# Patient Record
Sex: Female | Born: 1975 | Race: White | Hispanic: No | Marital: Married | Smoking: Never smoker
Health system: Southern US, Community
[De-identification: ages and names within clinical notes are randomized; demographics above are authoritative.]

## PROBLEM LIST (undated history)

## (undated) DIAGNOSIS — J209 Acute bronchitis, unspecified: Secondary | ICD-10-CM

## (undated) DIAGNOSIS — Z808 Family history of malignant neoplasm of other organs or systems: Secondary | ICD-10-CM

## (undated) DIAGNOSIS — Z803 Family history of malignant neoplasm of breast: Secondary | ICD-10-CM

## (undated) DIAGNOSIS — D649 Anemia, unspecified: Secondary | ICD-10-CM

## (undated) DIAGNOSIS — K219 Gastro-esophageal reflux disease without esophagitis: Secondary | ICD-10-CM

## (undated) DIAGNOSIS — G43909 Migraine, unspecified, not intractable, without status migrainosus: Secondary | ICD-10-CM

## (undated) DIAGNOSIS — F419 Anxiety disorder, unspecified: Secondary | ICD-10-CM

## (undated) DIAGNOSIS — Z8042 Family history of malignant neoplasm of prostate: Secondary | ICD-10-CM

## (undated) DIAGNOSIS — F32A Depression, unspecified: Secondary | ICD-10-CM

## (undated) DIAGNOSIS — C801 Malignant (primary) neoplasm, unspecified: Secondary | ICD-10-CM

## (undated) HISTORY — DX: Family history of malignant neoplasm of other organs or systems: Z80.8

## (undated) HISTORY — DX: Family history of malignant neoplasm of prostate: Z80.42

## (undated) HISTORY — DX: Family history of malignant neoplasm of breast: Z80.3

## (undated) HISTORY — PX: DILATION AND CURETTAGE OF UTERUS: SHX78

---

## 2006-04-16 HISTORY — PX: LAPAROTOMY: SHX154

## 2009-07-14 ENCOUNTER — Ambulatory Visit (HOSPITAL_COMMUNITY): Admission: RE | Admit: 2009-07-14 | Discharge: 2009-07-14 | Payer: Self-pay | Admitting: Gastroenterology

## 2011-04-17 NOTE — L&D Delivery Note (Signed)
Delivery Note At 12:30 PM a viable female was delivered via Vaginal, Spontaneous Delivery (Presentation: Left Occiput Anterior).  Nuchal cord x 2 around the neck. Unusually long cord. Reduced prior to delivery of shoulders and body.  APGAR: 8, 9; weight .   Placenta status: Intact, Manual removal due to cord evulsion. Pathology.  Cord: 3 vessels with the following complications: None.  Cord pH: 7.26  NICU Team present for delivery due to meconium and reduced variability on EFM  Anesthesia: None  Episiotomy: None Lacerations: 2nd degree;Labial Rt  with repair  "V" shaped tear Labia Majora and 1st degree Lt Labial Minora - with no repair Suture Repair: 3.0 vicryl Local anesthesia, 1% lidocaine for repair. Repair of Rt Labia Majora had good alignment. Est. Blood Loss (mL): 400 Baby was placed on maternal chest after evaluation of NICU Team. Baby placed on Father's chest during perineal repair.  Mom to postpartum.  Baby to nursery-stable.  Ocean Schildt, CNM. 03/17/2012, 1:33 PM

## 2011-06-10 ENCOUNTER — Encounter (HOSPITAL_COMMUNITY): Payer: Self-pay | Admitting: *Deleted

## 2011-06-10 ENCOUNTER — Emergency Department (HOSPITAL_COMMUNITY)
Admission: EM | Admit: 2011-06-10 | Discharge: 2011-06-10 | Disposition: A | Discharge status: Home / Self Care | Payer: BC Managed Care – PPO | Source: Home / Self Care | Attending: Emergency Medicine | Admitting: Emergency Medicine

## 2011-06-10 ENCOUNTER — Emergency Department (INDEPENDENT_AMBULATORY_CARE_PROVIDER_SITE_OTHER): Payer: BC Managed Care – PPO

## 2011-06-10 DIAGNOSIS — S51809A Unspecified open wound of unspecified forearm, initial encounter: Secondary | ICD-10-CM

## 2011-06-10 DIAGNOSIS — S51812A Laceration without foreign body of left forearm, initial encounter: Secondary | ICD-10-CM

## 2011-06-10 HISTORY — DX: Gastro-esophageal reflux disease without esophagitis: K21.9

## 2011-06-10 MED ORDER — BACITRACIN 500 UNIT/GM EX OINT
1.0000 "application " | TOPICAL_OINTMENT | Freq: Once | CUTANEOUS | Status: AC
  Administered 2011-06-10: 1 via TOPICAL

## 2011-06-10 MED ORDER — IBUPROFEN 600 MG PO TABS: 600.0000 mg | ORAL_TABLET | Freq: Four times a day (QID) | ORAL | Status: AC | PRN

## 2011-06-10 MED ORDER — LIDOCAINE-EPINEPHRINE 2 %-1:100000 IJ SOLN
5.0000 mL | Freq: Once | INTRAMUSCULAR | Status: AC
  Administered 2011-06-10: 5 mL

## 2011-06-10 NOTE — ED Provider Notes (Signed)
History     CSN: [id number]  Arrival date & time 06/10/11  1710   First MD Initiated Contact with Patient 06/10/11 1813      Chief Complaint  Patient presents with  . Laceration    (Consider location/radiation/quality/duration/timing/severity/associated sxs/prior treatment) HPI Comments: Patient states that she was gardening, and cut her left forearm on  Large gardening shears. Patient states she irrigated the wound copiously with tap water, and stop the bleeding by applying direct pressure. No foreign body sensation. No no tingling, numbness, pain distal to injury. No limitation in motion. No redness streaking from the wound.   Patient is a 36 y.o. female presenting with skin laceration. The history is provided by the patient. No language interpreter was used.  Laceration  The incident occurred 3 to 5 hours ago. The laceration is located on the left arm. The laceration is 2 cm in size. The pain has been constant since onset. She reports no foreign bodies present. Her tetanus status is UTD.    Past Medical History  Diagnosis Date  . Acid reflux   . Asthma     Past Surgical History  Procedure Date  . Dilation and curettage of uterus     History reviewed. No pertinent family history.  History  Substance Use Topics  . Smoking status: Never Smoker   . Smokeless tobacco: Not on file  . Alcohol Use: Yes    OB History    Grav Para Term Preterm Abortions TAB SAB Ect Mult Living                  Review of Systems  Constitutional: Negative for fever.  Musculoskeletal: Negative for joint swelling.  Skin: Positive for wound.  Neurological: Negative for weakness and numbness.    Allergies  Review of patient's allergies indicates no known allergies.  Home Medications   Current Outpatient Rx  Name Route Sig Dispense Refill  . ALBUTEROL SULFATE HFA 108 (90 BASE) MCG/ACT IN AERS Inhalation Inhale 2 puffs into the lungs every 6 (six) hours as needed.    Marland Kitchen ESOMEPRAZOLE  MAGNESIUM 40 MG PO CPDR Oral Take 40 mg by mouth daily before breakfast.    . FEMARA PO Oral Take by mouth. Cycle days 3-7    . IBUPROFEN 600 MG PO TABS Oral Take 1 tablet (600 mg total) by mouth every 6 (six) hours as needed for pain. 30 tablet 0    BP 114/77  Pulse 53  Temp(Src) 98.7 F (37.1 C) (Oral)  Resp 15  SpO2 100%  LMP 06/05/2011  Physical Exam  Nursing note and vitals reviewed. Constitutional: She is oriented to person, place, and time. She appears well-developed and well-nourished. No distress.  HENT:  Head: Normocephalic and atraumatic.  Eyes: Conjunctivae and EOM are normal.  Neck: Normal range of motion.  Cardiovascular: Normal rate.   Pulmonary/Chest: Effort normal.  Abdominal: She exhibits no distension.  Musculoskeletal: Normal range of motion.       1.5 cm laceration distal left forearm on ulnar side. No redness streaking from the wound. No apparent foreign body retained. RP 2+. Hand sensation and strength intact in the median, radian, ulnar nerve distribution.  Neurological: She is alert and oriented to person, place, and time.  Skin: Skin is warm and dry.  Psychiatric: She has a normal mood and affect. Her behavior is normal. Judgment and thought content normal.    ED Course  LACERATION REPAIR Date/Time: 06/10/2011 9:04 PM Performed by: Luiz Blare Authorized  by: Domenick Gong M Consent: Written consent not obtained. Risks and benefits: risks, benefits and alternatives were discussed Consent given by: patient Patient understanding: patient states understanding of the procedure being performed Patient consent: the patient's understanding of the procedure matches consent given Imaging studies: imaging studies available Required items: required blood products, implants, devices, and special equipment available Patient identity confirmed: verbally with patient Time out: Immediately prior to procedure a "time out" was called to verify the correct  patient, procedure, equipment, support staff and site/side marked as required. Body area: upper extremity Location details: left lower arm Laceration length: 1.5 cm Foreign bodies: no foreign bodies Tendon involvement: none Nerve involvement: none Vascular damage: no Anesthesia: local infiltration Local anesthetic: lidocaine 2% with epinephrine Anesthetic total: 5 ml Patient sedated: no Preparation: Patient was prepped and draped in the usual sterile fashion. Irrigation solution: tap water Irrigation method: tap (Chlorhexidine scrub) Amount of cleaning: extensive Debridement: none Degree of undermining: none Skin closure: 4-0 nylon Number of sutures: 3 Technique: simple Approximation: close Approximation difficulty: simple Dressing: 4x4 sterile gauze, antibiotic ointment and pressure dressing Patient tolerance: Patient tolerated the procedure well with no immediate complications. Comments: Wound explored with adequate hemostasis. No foreign body, tendon involvement noted.   (including critical care time)  Labs Reviewed - No data to display No results found.   1. Laceration of left forearm   No information on file.  Dg Forearm Left  06/10/2011  *RADIOLOGY REPORT*  Clinical Data: Laceration to the ulnar aspect of the forearm, with garden shears.  Assess for foreign body.  LEFT FOREARM - 2 VIEW  Comparison: None.  Findings: No radiopaque foreign body is identified.  There is minimal cortical disruption along the ulnar aspect of the distal ulnar diaphysis.  Overlying soft tissue disruption is noted, with mild soft tissue air.  Visualized joint spaces are preserved.  Negative ulnar variance is noted.  The carpal rows appear grossly intact, and demonstrate normal alignment.  The elbow joint is grossly unremarkable in appearance; no elbow joint effusion is identified.  No additional soft tissue abnormalities are identified.  IMPRESSION:  1.  No radiopaque foreign body identified. 2.   Minimal cortical disruption along the ulnar aspect of the distal ulnar diaphysis; overlying soft tissue disruption, with mild soft tissue air.  Original Report Authenticated By: Tonia Ghent, M.D.    MDM  Imaging reviewed by myself. No foreign body visualized Report per radiologist. Discussed findings with patient.   Luiz Blare, MD 06/10/11 2134

## 2011-06-10 NOTE — ED Notes (Addendum)
Pt with laceration left anterior forearm cut pruning shears approx 3 hours ago bleeding controlled tetnus 09

## 2011-06-10 NOTE — Discharge Instructions (Signed)
Ice 20 min at a time. Take the medication as written. Take 1 gram of tylenol with the motrin up to 4 times a day as needed for pain and fever. This is an effective combination for pain. Return if you get worse, have a fever >100.4, or for any concerns.

## 2011-06-20 ENCOUNTER — Emergency Department (INDEPENDENT_AMBULATORY_CARE_PROVIDER_SITE_OTHER)
Admission: EM | Admit: 2011-06-20 | Discharge: 2011-06-20 | Disposition: A | Discharge status: Home Health | Payer: BC Managed Care – PPO | Source: Home / Self Care

## 2011-06-20 ENCOUNTER — Encounter (HOSPITAL_COMMUNITY): Payer: Self-pay | Admitting: Emergency Medicine

## 2011-06-20 DIAGNOSIS — Z4802 Encounter for removal of sutures: Secondary | ICD-10-CM

## 2011-06-20 NOTE — ED Provider Notes (Signed)
History     CSN: [id number]  Arrival date & time 06/20/11  0806   None     Chief Complaint  Patient presents with  . Suture / Staple Removal    (Consider location/radiation/quality/duration/timing/severity/associated sxs/prior treatment) Patient is a 36 y.o. female presenting with suture removal.  Suture / Staple Removal  Treated in ED: 10 days ago. There has been no treatment since the wound repair. There has been no drainage from the wound. There is no redness present. There is no swelling present. The pain has no pain. She has no difficulty moving the affected extremity or digit.    Past Medical History  Diagnosis Date  . Acid reflux   . Asthma     Past Surgical History  Procedure Date  . Dilation and curettage of uterus     History reviewed. No pertinent family history.  History  Substance Use Topics  . Smoking status: Never Smoker   . Smokeless tobacco: Not on file  . Alcohol Use: Yes    OB History    Grav Para Term Preterm Abortions TAB SAB Ect Mult Living                  Review of Systems  Constitutional: Negative for fever.  Musculoskeletal: Negative for joint swelling.  Neurological: Negative for weakness and numbness.    Allergies  Review of patient's allergies indicates no known allergies.  Home Medications   Current Outpatient Rx  Name Route Sig Dispense Refill  . ALBUTEROL SULFATE HFA 108 (90 BASE) MCG/ACT IN AERS Inhalation Inhale 2 puffs into the lungs every 6 (six) hours as needed.    Marland Kitchen ESOMEPRAZOLE MAGNESIUM 40 MG PO CPDR Oral Take 40 mg by mouth daily before breakfast.    . IBUPROFEN 600 MG PO TABS Oral Take 1 tablet (600 mg total) by mouth every 6 (six) hours as needed for pain. 30 tablet 0  . FEMARA PO Oral Take by mouth. Cycle days 3-7      BP 103/66  Pulse 48  Temp(Src) 98.5 F (36.9 C) (Oral)  Resp 16  SpO2 100%  LMP 06/05/2011  Physical Exam  Nursing note and vitals reviewed. Constitutional: She appears well-developed  and well-nourished. No distress.  HENT:  Head: Normocephalic and atraumatic.  Musculoskeletal: Normal range of motion. She exhibits no edema and no tenderness.  Neurological: She is alert.  Skin: Skin is warm and dry.       Well healing laceration Lt forearm with 3 sutures in place.  Psychiatric: She has a normal mood and affect.    ED Course  SUTURE REMOVAL Date/Time: 06/20/2011 8:34 AM Performed by: Melody Comas Authorized by: Melody Comas Consent: Verbal consent obtained. Written consent not obtained. Consent given by: patient Patient understanding: patient states understanding of the procedure being performed Patient consent: the patient's understanding of the procedure matches consent given Body area: upper extremity Location details: left lower arm Wound Appearance: clean Sutures Removed: 3 Post-removal: Steri-Strips applied Facility: sutures placed in this facility Comments: Immediately after all 3 sutures removed wound edges noted to be beginning to dehisce. Therefore a Steri-Strip was applied. Patient was instructed to leave in place for 2-3 days and keep the area dry.   (including critical care time)  Labs Reviewed - No data to display No results found.   1. Visit for suture removal       MDM  Pt sent home with remaining steri strips for home application as needed -  if steri strip applied today comes off early, or if in 2-3 days when current steri strip is removed wound does not appear well healed to reapply steri strip.         Melody Comas, [place] 06/20/11 [phone]

## 2011-06-20 NOTE — ED Notes (Signed)
Pt here for stitch removal to left anterior f/a s/p laceration x10 dys ago.denies pain but itching.

## 2011-06-20 NOTE — Discharge Instructions (Signed)
Keep steri strip dry for 2-3 days. Then may remove the steri -strip. If the wound edges still appear to be seperating, continue to apply steri strip daily until well healed as discussed. Return as needed.

## 2011-06-25 NOTE — ED Provider Notes (Signed)
Medical screening examination/treatment/procedure(s) were performed by non-physician practitioner and as supervising physician I was immediately available for consultation/collaboration.  LANEY,RONNIE   Rorie Delmore B Laney, MD 06/25/11 2149 

## 2011-08-01 LAB — OB RESULTS CONSOLE ANTIBODY SCREEN: Antibody Screen: NEGATIVE

## 2011-08-01 LAB — OB RESULTS CONSOLE ABO/RH: RH Type: POSITIVE

## 2011-08-01 LAB — OB RESULTS CONSOLE HIV ANTIBODY (ROUTINE TESTING): HIV: NONREACTIVE

## 2011-08-01 LAB — OB RESULTS CONSOLE RUBELLA ANTIBODY, IGM: Rubella: IMMUNE

## 2011-08-01 LAB — OB RESULTS CONSOLE HEPATITIS B SURFACE ANTIGEN: Hepatitis B Surface Ag: NEGATIVE

## 2011-08-15 LAB — OB RESULTS CONSOLE GC/CHLAMYDIA
Chlamydia: NEGATIVE
Gonorrhea: NEGATIVE

## 2011-09-07 ENCOUNTER — Ambulatory Visit (HOSPITAL_COMMUNITY)
Admission: RE | Admit: 2011-09-07 | Discharge: 2011-09-07 | Disposition: A | Discharge status: Home / Self Care | Payer: BC Managed Care – PPO | Source: Ambulatory Visit | Attending: Obstetrics | Admitting: Obstetrics

## 2011-09-07 ENCOUNTER — Encounter (HOSPITAL_COMMUNITY): Payer: Self-pay

## 2011-09-07 DIAGNOSIS — O09299 Supervision of pregnancy with other poor reproductive or obstetric history, unspecified trimester: Secondary | ICD-10-CM

## 2011-09-07 NOTE — Progress Notes (Signed)
MATERNAL FETAL MEDICINE CONSULT  Patient Name: Niketa Turner Medical Record Number:  [id number] Date of Birth: 06/22/1975 Requesting Physician Name:  Tresa Endo A. Ernestina Penna, MD Date of Service: 09/07/2011  Chief Complaint Prior 2nd trimester pregnancy loss  History of Present Illness Shaylee Stanislawski was seen today for a history of a prior 2nd trimester pregnancy loss at the request of Tresa Endo A. Ernestina Penna, MD.  The patient is a 36 y.o. G3P0020 at [redacted]w[redacted]d with an EDC of 03/11/2012, by Last Menstrual Period.  In her first pregnancy in 2006 she experienced a loss of monochorionic diamniotic twins at 49 weeks.  From the patient's description of that pregnancy it is likely she developed cervical insufficiency, but preterm labor and/or infection as a potential etiology cannot be excluded.  She required a D&C after that delivery.  Her second pregnancy resulted in a missed abortion at 13 weeks, when the fetus was measuring 7 weeks.  Those pregnancies and her current one were conceived using ovulation induction.  She has had no complications of her current pregnancy thus far.   Review of Systems Pertinent items are noted in HPI.  Patient History OB History    Grav Para Term Preterm Abortions TAB SAB Ect Mult Living   3    2  2         # Outc Date GA Lbr Len/2nd Wgt Sex Del Anes PTL Lv   1 SAB 2006 [redacted]w[redacted]d          Comments: Twins 19 week loss, possible cervical insufficiency   2 SAB 2009 [redacted]w[redacted]d          Comments: Missed abortion, fetus measured 7 weeks   3 CUR               Past Medical History  Diagnosis Date  . Acid reflux   . Asthma     Past Surgical History  Procedure Date  . Dilation and curettage of uterus     2006 x1, 2009 x2    History   Social History  . Marital Status: Married    Spouse Name: N/A    Number of Children: N/A  . Years of Education: N/A   Social History Main Topics  . Smoking status: Never Smoker   . Smokeless tobacco: None  . Alcohol Use: No  . Drug Use: No  .  Sexually Active: Not Currently    Birth Control/ Protection: None   Other Topics Concern  . None   Social History Narrative  . None   Family History The patient has no family history of mental retardation, birth defects, or genetic diseases.  Assessment and Recommendations 1.  Prior pregnancy losses.  The patient's 13 weeks loss should have little effect on the prospects of her current pregnancy.  However, her 19 week loss raises concern for cervical insufficiency.  The true impact of this on her recurrence risk in this pregnancy is difficult to determine as it was a twin pregnancy, which is an independent risk factor for cervical insufficiency.  Thus, the currently available literature provides little guidance for a woman in Ms. Juday's situation.  However, given the minimal side effects of 17-OH progesterone it is reasonable to proceed with weekly injections beginning at 16 weeks of pregnancy.  Cervical length surveillance, also starting at 16 weeks can also be considered.  Cerclage would be an option of cervical length fell below 1.5 cm.  However, given the uncertainty of benefit in her particular situation forgoing any or all of those  treatments would be reasonable.  After discussing this at length with Ms. Hunsberger and her husband, she has elected to proceed with both 17-OH progesterone and cervical length surveillance.  She has some misgivings about cerclage placement given the risks involved and wishes to readdress this issue should she develop cervical shortening over the course of pregnancy.  Of note, Ms. Fobes reports she will be staying in [place], [place] for an extended period of time starting at approximately 18 weeks.  Arrangements for her 17-OH progesterone and cervical length surveillance will need to be made.  I spent 30 minutes with Ms. Jacobson today of which 50% was face-to-face counseling.  Rema Fendt, MD

## 2011-09-17 ENCOUNTER — Encounter: Payer: Self-pay | Admitting: Obstetrics

## 2011-09-17 ENCOUNTER — Other Ambulatory Visit: Payer: Self-pay | Admitting: Obstetrics

## 2011-09-17 LAB — US OB TRANSVAGINAL

## 2011-09-17 LAB — US OB COMP LESS 14 WKS

## 2011-12-21 LAB — OB RESULTS CONSOLE RPR: RPR: NONREACTIVE

## 2012-02-04 LAB — OB RESULTS CONSOLE GBS: GBS: NEGATIVE

## 2012-03-04 ENCOUNTER — Institutional Professional Consult (permissible substitution): Payer: Self-pay | Admitting: Pediatrics

## 2012-03-06 ENCOUNTER — Ambulatory Visit (INDEPENDENT_AMBULATORY_CARE_PROVIDER_SITE_OTHER): Payer: Self-pay | Admitting: Pediatrics

## 2012-03-06 DIAGNOSIS — Z7681 Expectant parent(s) prebirth pediatrician visit: Secondary | ICD-10-CM

## 2012-03-17 ENCOUNTER — Inpatient Hospital Stay (HOSPITAL_COMMUNITY)
Admission: AD | Admit: 2012-03-17 | Discharge: 2012-03-19 | DRG: 372 | Disposition: A | Discharge status: Home / Self Care | Payer: BC Managed Care – PPO | Source: Ambulatory Visit | Attending: Obstetrics & Gynecology | Admitting: Obstetrics & Gynecology

## 2012-03-17 ENCOUNTER — Encounter (HOSPITAL_COMMUNITY): Payer: Self-pay | Admitting: *Deleted

## 2012-03-17 DIAGNOSIS — O09529 Supervision of elderly multigravida, unspecified trimester: Secondary | ICD-10-CM | POA: Diagnosis present

## 2012-03-17 DIAGNOSIS — O9902 Anemia complicating childbirth: Secondary | ICD-10-CM | POA: Diagnosis not present

## 2012-03-17 DIAGNOSIS — O9903 Anemia complicating the puerperium: Secondary | ICD-10-CM | POA: Diagnosis not present

## 2012-03-17 DIAGNOSIS — D62 Acute posthemorrhagic anemia: Secondary | ICD-10-CM | POA: Diagnosis not present

## 2012-03-17 LAB — CBC
HCT: 40.7 % (ref 36.0–46.0)
Hemoglobin: 14.1 g/dL (ref 12.0–15.0)
MCH: 32.6 pg (ref 26.0–34.0)
MCHC: 34.6 g/dL (ref 30.0–36.0)
MCV: 94.2 fL (ref 78.0–100.0)
Platelets: 143 10*3/uL — ABNORMAL LOW (ref 150–400)
RBC: 4.32 MIL/uL (ref 3.87–5.11)
RDW: 14.4 % (ref 11.5–15.5)
WBC: 8.7 10*3/uL (ref 4.0–10.5)

## 2012-03-17 LAB — RPR: RPR Ser Ql: NONREACTIVE

## 2012-03-17 LAB — ABO/RH: ABO/RH(D): O POS

## 2012-03-17 MED ORDER — DIBUCAINE 1 % RE OINT
1.0000 "application " | TOPICAL_OINTMENT | RECTAL | Status: DC | PRN
  Administered 2012-03-17: 1 via RECTAL
  Filled 2012-03-17: qty 28

## 2012-03-17 MED ORDER — PRENATAL MULTIVITAMIN CH
1.0000 | ORAL_TABLET | Freq: Every day | ORAL | Status: DC
  Administered 2012-03-18 – 2012-03-19 (×2): 1 via ORAL
  Filled 2012-03-17 (×2): qty 1

## 2012-03-17 MED ORDER — LIDOCAINE HCL (PF) 1 % IJ SOLN: 30.0000 mL | INTRAMUSCULAR | Status: DC | PRN

## 2012-03-17 MED ORDER — MORPHINE SULFATE 4 MG/ML IJ SOLN
2.0000 mg | INTRAMUSCULAR | Status: AC
  Administered 2012-03-17: 2 mg via INTRAVENOUS

## 2012-03-17 MED ORDER — ONDANSETRON HCL 4 MG/2ML IJ SOLN: 4.0000 mg | INTRAMUSCULAR | Status: DC | PRN

## 2012-03-17 MED ORDER — IBUPROFEN 600 MG PO TABS
600.0000 mg | ORAL_TABLET | Freq: Four times a day (QID) | ORAL | Status: DC
  Administered 2012-03-17 – 2012-03-19 (×7): 600 mg via ORAL
  Filled 2012-03-17 (×7): qty 1

## 2012-03-17 MED ORDER — LACTATED RINGERS IV SOLN: INTRAVENOUS | Status: DC

## 2012-03-17 MED ORDER — OXYTOCIN BOLUS FROM INFUSION
500.0000 mL | INTRAVENOUS | Status: DC
  Administered 2012-03-17: 500 mL via INTRAVENOUS

## 2012-03-17 MED ORDER — LIDOCAINE HCL (PF) 1 % IJ SOLN
30.0000 mL | INTRAMUSCULAR | Status: DC | PRN
  Filled 2012-03-17 (×2): qty 30

## 2012-03-17 MED ORDER — OXYTOCIN 10 UNIT/ML IJ SOLN: 10.0000 [IU] | Freq: Once | INTRAMUSCULAR | Status: DC

## 2012-03-17 MED ORDER — SENNOSIDES-DOCUSATE SODIUM 8.6-50 MG PO TABS
2.0000 | ORAL_TABLET | Freq: Every day | ORAL | Status: DC
  Administered 2012-03-17 – 2012-03-18 (×2): 2 via ORAL

## 2012-03-17 MED ORDER — ONDANSETRON HCL 4 MG PO TABS: 4.0000 mg | ORAL_TABLET | ORAL | Status: DC | PRN

## 2012-03-17 MED ORDER — TETANUS-DIPHTH-ACELL PERTUSSIS 5-2.5-18.5 LF-MCG/0.5 IM SUSP: 0.5000 mL | Freq: Once | INTRAMUSCULAR | Status: DC

## 2012-03-17 MED ORDER — LACTATED RINGERS IV SOLN: 500.0000 mL | INTRAVENOUS | Status: DC | PRN

## 2012-03-17 MED ORDER — OXYCODONE-ACETAMINOPHEN 5-325 MG PO TABS: 1.0000 | ORAL_TABLET | ORAL | Status: DC | PRN

## 2012-03-17 MED ORDER — ONDANSETRON HCL 4 MG/2ML IJ SOLN: 4.0000 mg | Freq: Four times a day (QID) | INTRAMUSCULAR | Status: DC | PRN

## 2012-03-17 MED ORDER — CITRIC ACID-SODIUM CITRATE 334-500 MG/5ML PO SOLN: 30.0000 mL | ORAL | Status: DC | PRN

## 2012-03-17 MED ORDER — DIPHENHYDRAMINE HCL 25 MG PO CAPS: 25.0000 mg | ORAL_CAPSULE | Freq: Four times a day (QID) | ORAL | Status: DC | PRN

## 2012-03-17 MED ORDER — SIMETHICONE 80 MG PO CHEW: 80.0000 mg | CHEWABLE_TABLET | ORAL | Status: DC | PRN

## 2012-03-17 MED ORDER — LACTATED RINGERS IV BOLUS (SEPSIS): 1000.0000 mL | Freq: Once | INTRAVENOUS | Status: DC

## 2012-03-17 MED ORDER — IBUPROFEN 600 MG PO TABS: 600.0000 mg | ORAL_TABLET | Freq: Four times a day (QID) | ORAL | Status: DC | PRN

## 2012-03-17 MED ORDER — LACTATED RINGERS IV SOLN
500.0000 mL | INTRAVENOUS | Status: DC | PRN
  Administered 2012-03-17: 1000 mL via INTRAVENOUS

## 2012-03-17 MED ORDER — WITCH HAZEL-GLYCERIN EX PADS
1.0000 "application " | MEDICATED_PAD | CUTANEOUS | Status: DC | PRN
  Administered 2012-03-17: 1 via TOPICAL

## 2012-03-17 MED ORDER — OXYTOCIN 40 UNITS IN LACTATED RINGERS INFUSION - SIMPLE MED
62.5000 mL/h | INTRAVENOUS | Status: DC
Start: 2012-03-17 — End: 2012-03-17

## 2012-03-17 MED ORDER — ACETAMINOPHEN 325 MG PO TABS: 650.0000 mg | ORAL_TABLET | ORAL | Status: DC | PRN

## 2012-03-17 MED ORDER — ZOLPIDEM TARTRATE 5 MG PO TABS: 5.0000 mg | ORAL_TABLET | Freq: Every evening | ORAL | Status: DC | PRN

## 2012-03-17 MED ORDER — LANOLIN HYDROUS EX OINT: TOPICAL_OINTMENT | CUTANEOUS | Status: DC | PRN

## 2012-03-17 MED ORDER — OXYTOCIN 40 UNITS IN LACTATED RINGERS INFUSION - SIMPLE MED
62.5000 mL/h | INTRAVENOUS | Status: DC
  Filled 2012-03-17: qty 1000

## 2012-03-17 MED ORDER — CITRIC ACID-SODIUM CITRATE 334-500 MG/5ML PO SOLN
30.0000 mL | ORAL | Status: DC | PRN
  Filled 2012-03-17: qty 15

## 2012-03-17 MED ORDER — CEFAZOLIN SODIUM-DEXTROSE 2-3 GM-% IV SOLR
2.0000 g | INTRAVENOUS | Status: AC
  Administered 2012-03-17: 2 g via INTRAVENOUS
  Filled 2012-03-17: qty 50

## 2012-03-17 MED ORDER — MORPHINE SULFATE 4 MG/ML IJ SOLN
INTRAMUSCULAR | Status: AC
  Administered 2012-03-17: 2 mg via INTRAVENOUS
  Filled 2012-03-17: qty 1

## 2012-03-17 MED ORDER — BENZOCAINE-MENTHOL 20-0.5 % EX AERO
1.0000 "application " | INHALATION_SPRAY | CUTANEOUS | Status: DC | PRN
  Administered 2012-03-17: 1 via TOPICAL
  Filled 2012-03-17: qty 56

## 2012-03-17 MED ORDER — LACTATED RINGERS IV SOLN
INTRAVENOUS | Status: DC
  Administered 2012-03-17: 125 mL/h via INTRAVENOUS

## 2012-03-17 MED ORDER — ONDANSETRON HCL 4 MG/2ML IJ SOLN
4.0000 mg | Freq: Four times a day (QID) | INTRAMUSCULAR | Status: DC | PRN
  Administered 2012-03-17: 4 mg via INTRAVENOUS
  Filled 2012-03-17: qty 2

## 2012-03-17 NOTE — Progress Notes (Signed)
Strong pressure, strong urge to push. O VSS      fhts category 1      abd soft between uc      Contractions 1: 2 mins      Vag: 10/100%/+1     Commenced pushing at 12.12 hrs      Squatting position. A: Pushing with each UC P Expectant SVD  Earl Gala, CNM.

## 2012-03-17 NOTE — Progress Notes (Signed)
Addendum Note to Delivery Note: Morphine 4 mg iv x 1  Patient had manual removal of placenta due to cord evulsion. Manual removal - intact Uterine sweep - no retained placenta. Covered with Ancef 2 gm iv x 1 s/p uterine sweep. Uterus firm and contracted s/p delivery  Placenta to pathology.

## 2012-03-17 NOTE — Progress Notes (Signed)
Comfortable with assistance from Doula and  breathing through contractions. Patient wants minimal medication. O VSS      fhts category 2, with min variability and early decelerations at this time.      abd soft between uc      Contractions 1:3 mins       Vag 7/90%/+1 A SROM with Moderate Meconium - progressing quickly as Cx change 3 cms in last hour. P continue care Dr. Laurie Panda updated on the tracing and the rapid progress in labor since admission. Dr Ernestina Penna aware that her patient is now in hospital. Expectant management for SVD.  Earl Gala, CNM.

## 2012-03-17 NOTE — Progress Notes (Signed)
Patient has had position change and has been to bathroom to void. Back to bed and now in Kneeling position and rocking hips.  O VSS      fhts category 1, Now the EFM has moderate variability. Baseline 145 with asccelerations.      abd soft between uc      Contractions 1: 2 - 3 mins and patient is feeling pressure with each contraction.       Vag deferred as feeling more pressure with each contraction - signs of 8 + cms with transition A: Advanced dilation and progressed very wickly in labor - Moderate Meconium P Prep for delivery.     NICU team alerted to attend delivery due to previous EFM and Meconium    Expectant SVD.  Kim [name], CNM.

## 2012-03-17 NOTE — Progress Notes (Signed)
Octavio Manns CNM updated on FHR tracing with lates and interventions

## 2012-03-17 NOTE — Progress Notes (Signed)
Dr Jean Rosenthal and OR [facility] RN) updated on potential c/s

## 2012-03-17 NOTE — Progress Notes (Signed)
Up to bathroom

## 2012-03-17 NOTE — Progress Notes (Signed)
Forebag ruptured by attempted placement of IUPC

## 2012-03-17 NOTE — H&P (Signed)
Rawda Benally is a 36 y.o. female, G3P0020at [redacted]w[redacted]d, had been to office for evaluation of SROM with moderate meconium at aprox 0.700hrs. NST in office with Cat. 2 tracing with min- mod variability and intermittent late deceleration. Direct admit to hospital. EFM with internal monitoring for accurate assessment of EFM tracing. SVE on admission 4/80/-2. Forebag ruptures and Vx descent to +1 s/p ROM forebag.GBS: neg IV fluid bolus and O2 mask stat. NPO.  There is no problem list on file for this patient.   History of present pregnancy: Patient entered care at 10 weeks.   EDC of 03/11/12 was established by LMP  (06/04/11)and Sono    Anatomy scan: Normal at 20 weeks. Additional Sono for Cervical evaluation at 03/31/17 weeks. Commenced 17p at 14 weeks and Baby Aspirin po daily due to history of losses and possible cervical incompetence. Significant prenatal events: had been attending Dr. Ernestina Penna for antenatal care until 34 weeks and changed to CNM care for water birth.  Last evaluation: this morning 03/17/12 at office with History of SROM at 0.7.00 hrs approx.  OB History    Grav Para Term Preterm Abortions TAB SAB Ect Mult Living   3    2  2         Past Medical History  Diagnosis Date  . Acid reflux   . Asthma    Past Surgical History  Procedure Date  . Dilation and curettage of uterus     2006 x1, 2009 x2  . Laparotomy 2008   Family History: family history is not on file. Social History:  reports that she has never smoked. She does not have any smokeless tobacco history on file. She reports that she does not drink alcohol or use illicit drugs.   Prenatal Transfer Tool  Maternal Diabetes: No Genetic Screening: Normal Maternal Ultrasounds/Referrals: Normal Fetal Ultrasounds or other Referrals:  Referred to Materal Fetal Medicine , Started on 17p Maternal Substance Abuse:  No Significant Maternal Medications:  Meds include: Other: History of asthma - Xopenex inhaler mostly used when  exercising. Significant Maternal Lab Results: None Has Carpel Tunnel Syndrome bilaterally from 2nd Trimester  - splints to assist.    ROS:   No Known Allergies   Dilation: 4 Effacement (%): 80 Station: -2 Exam by:: D Davis  CNM Blood pressure 129/79, pulse 102, temperature 97.5 F (36.4 C), temperature source Axillary, resp. rate 16, height 5\' 9"  (1.753 m), weight 78.926 kg (174 lb), last menstrual period 06/05/2011. Affect:AAO x 3. Slightly anxious Chest clear Heart RRR without murmur Abd gravid, NT, FH 155, Cat 2 with intermittent late deceleration and variables - min - moderate variability with internal resuscitation methods. Abdomen soft. N/T between contractions. SVE: 4/80/+1, Moderate Meconium.   FHR: 155 BPM UCs:  3- 4 mins FSE and IUPC in place.  Prenatal labs: ABO, Rh: O/Positive/-- (04/17 0000) Antibody: Negative (04/17 0000) Rubella:   immune RPR: Nonreactive (09/06 0000)  HBsAg: Negative (04/17 0000)  HIV: Non-reactive (04/17 0000)  GBS: Negative (10/21 0000) Sickle cell/Hgb electrophoresis:   Pap:  norm GC: neg Chlamydia: neg Genetic screenings:  normal Glucola: normal.  Assessment/Plan: IUP at [redacted]w[redacted]d SROM; Moderate meconium. EFM: Cat 2   Plan: Internal monitoring for EFM surveillance. Trying for vaginal delivery. Dr. Ernestina Penna informed. Dr Seymour Bars is the admitting MD but Dr. Ernestina Penna may attend the birth. Had hoped for Water Birth and Elige Radon Method with Doula to assist but at present plans on hold to enable better surveillance of EFM.  Sanaiyah Kirchhoff,CNM.  03/17/2012, 11:00 AM

## 2012-03-18 DIAGNOSIS — O9902 Anemia complicating childbirth: Secondary | ICD-10-CM | POA: Diagnosis not present

## 2012-03-18 LAB — CBC
HCT: 27.5 % — ABNORMAL LOW (ref 36.0–46.0)
Hemoglobin: 9.3 g/dL — ABNORMAL LOW (ref 12.0–15.0)
MCH: 31.7 pg (ref 26.0–34.0)
MCHC: 33.8 g/dL (ref 30.0–36.0)
MCV: 93.9 fL (ref 78.0–100.0)
Platelets: 123 10*3/uL — ABNORMAL LOW (ref 150–400)
RBC: 2.93 MIL/uL — ABNORMAL LOW (ref 3.87–5.11)
RDW: 14.6 % (ref 11.5–15.5)
WBC: 9.1 10*3/uL (ref 4.0–10.5)

## 2012-03-18 NOTE — Progress Notes (Signed)
Post Partum Day 1 NSVD viable female infant,  Manual removal of placenta, Bilateral labial lacerations. Subjective: no complaints, up ad lib without syncope, voiding, tolerating PO, + flatus, +BM  Pain well controlled with po meds, taking motrin and percocet  BF: breastfeeding well. Mood stable, bonding well.  Objective: Blood pressure 99/55, pulse 67, temperature 98.5 F (36.9 C), temperature source Oral, resp. rate 18, height 5\' 9"  (1.753 m), weight 78.926 kg (174 lb), last menstrual period 06/05/2011, SpO2 97.00%, unknown if currently breastfeeding.  Physical Exam:  General: alert, cooperative and no distress Breasts: N/T Lungs: CTAB Heart: RRR Lochia: appropriate Uterine Fundus: firm, slight tenderness of fundus s/p manual removal of placenta.remains afebrile. Had IV ABX (Ancef 2gm)  Perineum: healing well, min swelling, ice applied as instructed. DVT Evaluation: No evidence of DVT seen on physical exam. Negative Homan's sign. No cords or calf tenderness. No significant calf/ankle edema.   Basename 03/18/12 0515 03/17/12 1010  HGB 9.3* 14.1  HCT 27.5* 40.7   Anemia of pregnancy. Commenced on Niferex 150mg  po daily.  Assessment/Plan: Plan for discharge tomorrow Coping well      LOS: 1 day   Giancarlo Askren, CNM. 03/18/2012, 9:05 AM

## 2012-03-19 NOTE — Discharge Summary (Signed)
Obstetric Discharge Summary  Reason for Admission: onset of labor  - SROM - moderate MEC Prenatal Procedures: none Intrapartum Procedures: FHR category 3 on arrival to category 2 after IV and O2 - precipitous labor and delivery / planed waterbirth - not candidate due to FHR   spontaneous vaginal delivery / manual placental removal with cord avulsion / delivery by Paulino Door CNM  Postpartum Procedures: none Complications-Operative and Postpartum: 2nd degree perineal laceration with vaginal lacerations                                                                            Mild acute blood loss anemia  Hemoglobin  Date Value Range Status  03/18/2012 9.3* 12.0 - 15.0 g/dL Final     DELTA CHECK NOTED     REPEATED TO VERIFY     HCT  Date Value Range Status  03/18/2012 27.5* 36.0 - 46.0 % Final    Physical Exam:  General: alert, cooperative and no distress Lochia: appropriate Uterine Fundus: firm Incision: healing well DVT Evaluation: No evidence of DVT seen on physical exam.  Discharge Diagnoses: Term Pregnancy-delivered  Discharge Information: Date: 03/19/2012 Activity: pelvic rest Diet: routine Medications: OTC tylenol and motrin                         prenatal vitamin daily                         OTC iron every other day for 1 month  Condition: stable Instructions: refer to practice specific booklet Discharge to: home Follow-up Information    Follow up with [facility] A[facility] MD. Schedule an appointment as soon as possible for a visit in 6 weeks.   Contact information:   [address] [place] [address] [phone]          Newborn Data: Live born female  Birth Weight: 7 lb 2.3 oz (3240 g) APGAR: 8, 9  Home with mother.  Marlinda Mike 03/19/2012, 12:05 PM

## 2012-03-19 NOTE — Progress Notes (Signed)
PPD 2 SVD  S:  Reports feeling great - so happy about birth              Tolerating po/ No nausea or vomiting             Bleeding is light             Pain controlled with motrin only             Up ad lib / ambulatory  Newborn breast-feeding     O:               VS: BP 104/65  Pulse 63  Temp 98.2 F (36.8 C) (Oral)  Resp 18  Ht 5\' 9"  (1.753 m)  Wt 78.926 kg (174 lb)  BMI 25.70 kg/m2  SpO2 97%  LMP 06/05/2011  Breastfeeding? Unknown   LABS:  Basename 03/18/12 0515 03/17/12 1010  WBC 9.1 8.7  HGB 9.3* 14.1  PLT 123* 143*                           Physical Exam:             Alert and oriented X3  Lungs: Clear and unlabored  Heart: regular rate and rhythm / no mumurs  Abdomen: soft, non-tender, non-distended              Fundus: firm, non-tender, U-2  Perineum: no edema  Lochia: light  Extremities: 2+ pedal edema, no calf pain or tenderness / + hand edema with carpal tunnel symptoms    A: PPD # 2              Mild acute blood loss anemia             Dependent edema   Doing well - stable status  P:  Routine post partum orders  Discharge to home  Marlinda Mike CNM, MSN 03/19/2012, 12:01 PM

## 2012-03-25 ENCOUNTER — Telehealth (HOSPITAL_COMMUNITY): Payer: Self-pay | Admitting: *Deleted

## 2012-03-25 NOTE — Telephone Encounter (Signed)
Resolve episode 

## 2012-09-26 ENCOUNTER — Ambulatory Visit (INDEPENDENT_AMBULATORY_CARE_PROVIDER_SITE_OTHER): Payer: BC Managed Care – PPO | Admitting: Surgery

## 2012-09-26 ENCOUNTER — Ambulatory Visit (INDEPENDENT_AMBULATORY_CARE_PROVIDER_SITE_OTHER): Payer: Self-pay | Admitting: General Surgery

## 2012-09-26 ENCOUNTER — Encounter (INDEPENDENT_AMBULATORY_CARE_PROVIDER_SITE_OTHER): Payer: Self-pay | Admitting: Surgery

## 2012-09-26 VITALS — BP 100/60 | HR 65 | Temp 96.9°F | Resp 14 | Ht 69.0 in | Wt 134.8 lb

## 2012-09-26 DIAGNOSIS — D1739 Benign lipomatous neoplasm of skin and subcutaneous tissue of other sites: Secondary | ICD-10-CM

## 2012-09-26 DIAGNOSIS — D170 Benign lipomatous neoplasm of skin and subcutaneous tissue of head, face and neck: Secondary | ICD-10-CM

## 2012-09-26 NOTE — Patient Instructions (Signed)

## 2012-09-26 NOTE — Progress Notes (Signed)
Subjective:     Patient ID: Kim [name], female   DOB: 01-20-1976, 37 y.o.   MRN: [id number]  HPI Patient sent at the request of Dr.Husain for lipoma on left side of neck. This is been present for many years. It is not causing symptoms itself the patient has history of neck and shoulder pain secondary to playing the violin fashionably. Has recently had a child. Presents today to discuss removal of this lipoma and the pros and cons associated with this. She has not noticed this lipoma herself and he was brought to her attention years ago by a friend. It doesn't sound like it's growing very quickly at this point.  Review of Systems  Constitutional: Negative.   HENT: Negative.   Eyes: Negative.   Respiratory: Negative.   Cardiovascular: Negative.   Gastrointestinal: Negative.   Endocrine: Negative.   Genitourinary: Negative.   Allergic/Immunologic: Negative.   Neurological: Negative.   Hematological: Negative.   Psychiatric/Behavioral: Negative.        Objective:   Physical Exam  Constitutional: She appears well-developed and well-nourished.  HENT:  Head:    Skin: Skin is warm and dry.  Psychiatric: She has a normal mood and affect. Her behavior is normal. Judgment and thought content normal.       Assessment:     4 cm lipoma posterior left neck    Plan:     Discussed operative removal of this the patient as well as the potential risks involved with this. There is a small but finite risk of injury to the spinal accessory nerve. This risk is very low but given her line of work could be catastrophic. The lipoma is not causing any symptoms myself and I doubt is causing her neck pain or shoulder pain. I discussed the possibility of removing this and after discussing the risks and the fact that this with not change or neck pain or shoulder pain she elected to continue to observe unless it becomes much larger or itself causes more pain. Return to clinic as needed.

## 2013-11-30 LAB — OB RESULTS CONSOLE ANTIBODY SCREEN: Antibody Screen: NEGATIVE

## 2013-11-30 LAB — OB RESULTS CONSOLE RUBELLA ANTIBODY, IGM: Rubella: IMMUNE

## 2013-11-30 LAB — OB RESULTS CONSOLE ABO/RH: RH Type: POSITIVE

## 2013-11-30 LAB — OB RESULTS CONSOLE HEPATITIS B SURFACE ANTIGEN: Hepatitis B Surface Ag: NEGATIVE

## 2013-11-30 LAB — OB RESULTS CONSOLE RPR: RPR: NONREACTIVE

## 2013-12-10 LAB — OB RESULTS CONSOLE GC/CHLAMYDIA
Chlamydia: NEGATIVE
Gonorrhea: NEGATIVE

## 2013-12-10 LAB — OB RESULTS CONSOLE HIV ANTIBODY (ROUTINE TESTING): HIV: NONREACTIVE

## 2014-02-15 ENCOUNTER — Encounter (INDEPENDENT_AMBULATORY_CARE_PROVIDER_SITE_OTHER): Payer: Self-pay | Admitting: Surgery

## 2014-04-16 NOTE — L&D Delivery Note (Signed)
Delivery Note   Kim [name], Kim [name] [[id number]]  At 9:56 AM a viable female was delivered via Vaginal, Spontaneous Delivery (Presentation DOA: ;  ).  APGAR: per RN, ; weight  . pending  Placenta status: intact , .  Cord: 3VC  with the following complications: .none  Anesthesia:  Epidural Episiotomy:  none Lacerations:  Abrasions, L periurethral and R inferior labia, both reapproximated with single 4-0 vicyle Suture Repair: 4-0 vircyl Est. Blood Loss (mL):  550  Delivery of baby mom, after several minutes cord clamed and cut. Exam and u/s to confirm position of B. Vtx confirmed, with fundal pressure, AROM done- clear, remained vtx.     Kim [name], Kim [name] [[id number]]  At 10:04 AM a viable female was delivered via Vaginal, Spontaneous Delivery (Presentation: ;DOA  ).  APGAR: per RN, ; weight pending .   Placenta status: intace, .  Cord: 3VC  with the following complications: .none  Anesthesia:  epidrual Episiotomy:  none Lacerations:  As above Suture Repair: as above Est. Blood Loss (mL):  550   Mom to postpartum.   Baby A to Couplet care / Skin to Skin.   Baby B to Couplet care / Skin to Skin.  Shayn Madole A. 06/16/2014, 10:25 AM

## 2014-05-09 ENCOUNTER — Telehealth: Payer: Self-pay | Admitting: Urgent Care

## 2014-05-09 ENCOUNTER — Ambulatory Visit (INDEPENDENT_AMBULATORY_CARE_PROVIDER_SITE_OTHER): Payer: BC Managed Care – PPO | Admitting: Urgent Care

## 2014-05-09 VITALS — BP 112/64 | HR 80 | Temp 99.0°F | Resp 16 | Ht 70.25 in | Wt 172.0 lb

## 2014-05-09 DIAGNOSIS — J40 Bronchitis, not specified as acute or chronic: Secondary | ICD-10-CM

## 2014-05-09 DIAGNOSIS — R059 Cough, unspecified: Secondary | ICD-10-CM

## 2014-05-09 DIAGNOSIS — R062 Wheezing: Secondary | ICD-10-CM

## 2014-05-09 DIAGNOSIS — R0602 Shortness of breath: Secondary | ICD-10-CM

## 2014-05-09 DIAGNOSIS — R05 Cough: Secondary | ICD-10-CM

## 2014-05-09 MED ORDER — IPRATROPIUM BROMIDE 0.02 % IN SOLN
0.5000 mg | Freq: Once | RESPIRATORY_TRACT | Status: AC
  Administered 2014-05-09: 0.5 mg via RESPIRATORY_TRACT

## 2014-05-09 MED ORDER — PROMETHAZINE-CODEINE 6.25-10 MG/5ML PO SYRP: 5.0000 mL | ORAL_SOLUTION | Freq: Every evening | ORAL | Status: DC | PRN

## 2014-05-09 MED ORDER — PREDNISONE 20 MG PO TABS: 20.0000 mg | ORAL_TABLET | Freq: Every day | ORAL | Status: DC

## 2014-05-09 MED ORDER — ALBUTEROL SULFATE (2.5 MG/3ML) 0.083% IN NEBU
2.5000 mg | INHALATION_SOLUTION | Freq: Once | RESPIRATORY_TRACT | Status: AC
  Administered 2014-05-09: 2.5 mg via RESPIRATORY_TRACT

## 2014-05-09 NOTE — Patient Instructions (Signed)
- Please take cough syrup at night to help with your cough - For prednisone course:  Day 1-3: Take 3 tablets (60mg  total each day)  Day 4: Take 2 tablets (40mg )  Day 5: Take 1 tablet - Schedule your Ventolin 3 times a day - Please also use your humidifier at night, nasal saline spray to maintain a moist airway - If you have no improvement in your symptoms in 3 days, let me know and we will prescribe an antibiotic    Acute Bronchitis Bronchitis is inflammation of the airways that extend from the windpipe into the lungs (bronchi). The inflammation often causes mucus to develop. This leads to a cough, which is the most common symptom of bronchitis.  In acute bronchitis, the condition usually develops suddenly and goes away over time, usually in a couple weeks. Smoking, allergies, and asthma can make bronchitis worse. Repeated episodes of bronchitis may cause further lung problems.  CAUSES Acute bronchitis is most often caused by the same virus that causes a cold. The virus can spread from person to person (contagious) through coughing, sneezing, and touching contaminated objects. SIGNS AND SYMPTOMS   Cough.   Fever.   Coughing up mucus.   Body aches.   Chest congestion.   Chills.   Shortness of breath.   Sore throat.  DIAGNOSIS  Acute bronchitis is usually diagnosed through a physical exam. Your health care provider will also ask you questions about your medical history. Tests, such as chest X-rays, are sometimes done to rule out other conditions.  TREATMENT  Acute bronchitis usually goes away in a couple weeks. Oftentimes, no medical treatment is necessary. Medicines are sometimes given for relief of fever or cough. Antibiotic medicines are usually not needed but may be prescribed in certain situations. In some cases, an inhaler may be recommended to help reduce shortness of breath and control the cough. A cool mist vaporizer may also be used to help thin bronchial  secretions and make it easier to clear the chest.  HOME CARE INSTRUCTIONS  Get plenty of rest.   Drink enough fluids to keep your urine clear or pale yellow (unless you have a medical condition that requires fluid restriction). Increasing fluids may help thin your respiratory secretions (sputum) and reduce chest congestion, and it will prevent dehydration.   Take medicines only as directed by your health care provider.  If you were prescribed an antibiotic medicine, finish it all even if you start to feel better.  Avoid smoking and secondhand smoke. Exposure to cigarette smoke or irritating chemicals will make bronchitis worse. If you are a smoker, consider using nicotine gum or skin patches to help control withdrawal symptoms. Quitting smoking will help your lungs heal faster.   Reduce the chances of another bout of acute bronchitis by washing your hands frequently, avoiding people with cold symptoms, and trying not to touch your hands to your mouth, nose, or eyes.   Keep all follow-up visits as directed by your health care provider.  SEEK MEDICAL CARE IF: Your symptoms do not improve after 1 week of treatment.  SEEK IMMEDIATE MEDICAL CARE IF:  You develop an increased fever or chills.   You have chest pain.   You have severe shortness of breath.  You have bloody sputum.   You develop dehydration.  You faint or repeatedly feel like you are going to pass out.  You develop repeated vomiting.  You develop a severe headache. MAKE SURE YOU:   Understand these instructions.  Will watch your condition.  Will get help right away if you are not doing well or get worse. Document Released: 05/10/2004 Document Revised: 08/17/2013 Document Reviewed: 09/23/2012 [facility] Patient Information 2015 [place], [place]. This information is not intended to replace advice given to you by your health care provider. Make sure you discuss any questions you have with your health care  provider.

## 2014-05-09 NOTE — Progress Notes (Signed)
    MRN: [id number] DOB: [birth year]  Subjective:   Kim [name] is a 38 y.o. female, currently pregnant, presenting for 1 week history of worsening non-productive cough worse at night. Patient has not been able to sleep d/t cough, is aggravated with deep breathing. Associated symptoms include night-time congestion, shob with cough. Has been trying Ventolin, Mucinex DM, Mucinex, flonase, tea without relief. Has been tested for Strep throat, negative result. Denies fevers, itchy or watery eyes, sinus pain, ear pain, ear drainage, chest pain, chest tightness, wheezing, n/v, abdominal pain, diarrhea. Patient has a toddler, goes to a daycare, currently being treated for pink eye, otherwise no other sick contacts. Has a history of exercise induced asthma, using albuterol inhaler every ~4 hours with some relief. Of note, patient was treated with Ampicillin for Strep throat around Christmas 2015, tested negative for Strep throat ~2 weeks ago. Denies smoking or alcohol use. Denies any other aggravating or relieving factors, no other questions or concerns.  Kim [name] has a current medication list which includes the following prescription(s): fish oil-omega-3 fatty acids, fluticasone, hydroxyprogesterone caproate, multivitamin with minerals, prenatal multivitamin, probiotic product, calcium carbonate, cephalexin, and diphenhydramine.  She has No Known Allergies.  Kim [name]  has a past medical history of Acid reflux and Asthma. Also  has past surgical history that includes Dilation and curettage of uterus and laparotomy (2008).  ROS As in subjective.  Objective:   Vitals: BP 112/64 mmHg  Pulse 80  Temp(Src) 99 F (37.2 C) (Oral)  Resp 16  Ht 5' 10.25" (1.784 m)  Wt 172 lb (78.019 kg)  BMI 24.51 kg/m2  SpO2 99%  Peak Flow 300 prior to nebulizer treatment. Peak Flow 325 s/p nebulizer treatment.  Physical Exam  Constitutional: She is oriented to person, place, and time and well-developed,  well-nourished, and in no distress.  HENT:  TM's intact bilaterally, no effusions or erythema. Nasal turbinates pink and dry, some dry yellow mucus also present. Oropharynx slight erythema, no exudates, tonsillar edema.  Eyes: Conjunctivae are normal. Right eye exhibits no discharge. Left eye exhibits no discharge. No scleral icterus.  Cardiovascular: Normal rate, regular rhythm, normal heart sounds and intact distal pulses.  Exam reveals no gallop and no friction rub.   No murmur heard. Pulmonary/Chest: Effort normal. No stridor. No respiratory distress. She has wheezes (expiratory). She has no rales. She exhibits no tenderness.  Abdominal: Bowel sounds are normal.  Lymphadenopathy:    She has no cervical adenopathy.  Neurological: She is alert and oriented to person, place, and time.  Skin: Skin is warm and dry. No rash noted. She is not diaphoretic. No erythema.  Psychiatric: Mood and affect normal.   Assessment and Plan :   1. Cough 2. Expiratory wheezing 3. Shortness of breath 4. Bronchitis - Cough improved s/p neb tx with albuterol and ipratropium - Start short course of steroids, scheduled albuterol inhaler q6h, cough syrup for cough, supportive care including nasal saline, humidifier - Advised that she call if she is not experiencing any improvement in 3 days, will rx antibiotic at that time - Patient will f/u with her OB/GYN on 05/12/2014, advised her that she can f/u with them as well   Kim [name] Eagles, PA-C [facility] [phone] 05/09/2014 11:03 AM

## 2014-05-09 NOTE — Telephone Encounter (Signed)
Script has been faxed to the pharmacy. Pt advised.

## 2014-05-09 NOTE — Telephone Encounter (Signed)
Did not provide patient with cough syrup script while she was here. Please call patient and ask her to return for the script. Sorry about this!  Jaynee Eagles, PA-C [facility] [phone] 05/09/2014  12:22 PM

## 2014-05-10 NOTE — Progress Notes (Signed)
  Medical screening examination/treatment/procedure(s) were performed by non-physician practitioner and as supervising physician I was immediately available for consultation/collaboration.     

## 2014-05-25 LAB — OB RESULTS CONSOLE GBS: GBS: NEGATIVE

## 2014-06-15 ENCOUNTER — Encounter (HOSPITAL_COMMUNITY): Payer: Self-pay | Admitting: *Deleted

## 2014-06-15 ENCOUNTER — Inpatient Hospital Stay (HOSPITAL_COMMUNITY)
Admission: AD | Admit: 2014-06-15 | Discharge: 2014-06-18 | DRG: 775 | Disposition: A | Discharge status: Home / Self Care | Payer: BC Managed Care – PPO | Source: Ambulatory Visit | Attending: Obstetrics | Admitting: Obstetrics

## 2014-06-15 DIAGNOSIS — O9902 Anemia complicating childbirth: Secondary | ICD-10-CM | POA: Diagnosis present

## 2014-06-15 DIAGNOSIS — O322XX2 Maternal care for transverse and oblique lie, fetus 2: Principal | ICD-10-CM | POA: Diagnosis present

## 2014-06-15 DIAGNOSIS — Z3A37 37 weeks gestation of pregnancy: Secondary | ICD-10-CM | POA: Diagnosis present

## 2014-06-15 DIAGNOSIS — K219 Gastro-esophageal reflux disease without esophagitis: Secondary | ICD-10-CM | POA: Diagnosis present

## 2014-06-15 DIAGNOSIS — O09523 Supervision of elderly multigravida, third trimester: Secondary | ICD-10-CM

## 2014-06-15 DIAGNOSIS — O30043 Twin pregnancy, dichorionic/diamniotic, third trimester: Secondary | ICD-10-CM | POA: Diagnosis present

## 2014-06-15 DIAGNOSIS — O2213 Genital varices in pregnancy, third trimester: Secondary | ICD-10-CM | POA: Diagnosis present

## 2014-06-15 DIAGNOSIS — J45909 Unspecified asthma, uncomplicated: Secondary | ICD-10-CM | POA: Diagnosis present

## 2014-06-15 DIAGNOSIS — Z23 Encounter for immunization: Secondary | ICD-10-CM | POA: Diagnosis not present

## 2014-06-15 DIAGNOSIS — N858 Other specified noninflammatory disorders of uterus: Secondary | ICD-10-CM | POA: Diagnosis present

## 2014-06-15 DIAGNOSIS — D649 Anemia, unspecified: Secondary | ICD-10-CM | POA: Diagnosis present

## 2014-06-15 DIAGNOSIS — O9962 Diseases of the digestive system complicating childbirth: Secondary | ICD-10-CM | POA: Diagnosis present

## 2014-06-15 DIAGNOSIS — O9952 Diseases of the respiratory system complicating childbirth: Secondary | ICD-10-CM | POA: Diagnosis present

## 2014-06-15 DIAGNOSIS — O30049 Twin pregnancy, dichorionic/diamniotic, unspecified trimester: Secondary | ICD-10-CM | POA: Diagnosis present

## 2014-06-15 HISTORY — DX: Migraine, unspecified, not intractable, without status migrainosus: G43.909

## 2014-06-15 HISTORY — DX: Anemia, unspecified: D64.9

## 2014-06-15 HISTORY — DX: Acute bronchitis, unspecified: J20.9

## 2014-06-15 LAB — OB RESULTS CONSOLE GC/CHLAMYDIA

## 2014-06-15 MED ORDER — OXYTOCIN 40 UNITS IN LACTATED RINGERS INFUSION - SIMPLE MED
1.0000 m[IU]/min | INTRAVENOUS | Status: DC
  Administered 2014-06-15: 2 m[IU]/min via INTRAVENOUS
  Filled 2014-06-15: qty 1000

## 2014-06-15 MED ORDER — TERBUTALINE SULFATE 1 MG/ML IJ SOLN: 0.2500 mg | Freq: Once | INTRAMUSCULAR | Status: AC | PRN

## 2014-06-15 MED ORDER — CITRIC ACID-SODIUM CITRATE 334-500 MG/5ML PO SOLN: 30.0000 mL | ORAL | Status: DC | PRN

## 2014-06-15 MED ORDER — EPHEDRINE 5 MG/ML INJ
10.0000 mg | INTRAVENOUS | Status: DC | PRN
  Filled 2014-06-15: qty 2

## 2014-06-15 MED ORDER — OXYCODONE-ACETAMINOPHEN 5-325 MG PO TABS: 2.0000 | ORAL_TABLET | ORAL | Status: DC | PRN

## 2014-06-15 MED ORDER — PHENYLEPHRINE 40 MCG/ML (10ML) SYRINGE FOR IV PUSH (FOR BLOOD PRESSURE SUPPORT)
80.0000 ug | PREFILLED_SYRINGE | INTRAVENOUS | Status: DC | PRN
  Filled 2014-06-15: qty 20
  Filled 2014-06-15: qty 2

## 2014-06-15 MED ORDER — OXYCODONE-ACETAMINOPHEN 5-325 MG PO TABS: 1.0000 | ORAL_TABLET | ORAL | Status: DC | PRN

## 2014-06-15 MED ORDER — ONDANSETRON HCL 4 MG/2ML IJ SOLN: 4.0000 mg | Freq: Four times a day (QID) | INTRAMUSCULAR | Status: DC | PRN

## 2014-06-15 MED ORDER — ZOLPIDEM TARTRATE 5 MG PO TABS
5.0000 mg | ORAL_TABLET | Freq: Every evening | ORAL | Status: DC | PRN
  Administered 2014-06-16: 5 mg via ORAL
  Filled 2014-06-15: qty 1

## 2014-06-15 MED ORDER — PHENYLEPHRINE 40 MCG/ML (10ML) SYRINGE FOR IV PUSH (FOR BLOOD PRESSURE SUPPORT)
80.0000 ug | PREFILLED_SYRINGE | INTRAVENOUS | Status: DC | PRN
  Filled 2014-06-15: qty 2

## 2014-06-15 MED ORDER — DIPHENHYDRAMINE HCL 50 MG/ML IJ SOLN: 12.5000 mg | INTRAMUSCULAR | Status: DC | PRN

## 2014-06-15 MED ORDER — OXYTOCIN 40 UNITS IN LACTATED RINGERS INFUSION - SIMPLE MED
62.5000 mL/h | INTRAVENOUS | Status: DC
  Administered 2014-06-16: 62.5 mL/h via INTRAVENOUS
  Filled 2014-06-15: qty 1000

## 2014-06-15 MED ORDER — LACTATED RINGERS IV SOLN: 500.0000 mL | INTRAVENOUS | Status: DC | PRN

## 2014-06-15 MED ORDER — FENTANYL 2.5 MCG/ML BUPIVACAINE 1/10 % EPIDURAL INFUSION (WH - ANES)
14.0000 mL/h | INTRAMUSCULAR | Status: DC | PRN
  Administered 2014-06-16: 14 mL/h via EPIDURAL
  Filled 2014-06-15: qty 125

## 2014-06-15 MED ORDER — LACTATED RINGERS IV SOLN
INTRAVENOUS | Status: DC
  Administered 2014-06-15 – 2014-06-16 (×2): via INTRAVENOUS

## 2014-06-15 MED ORDER — LIDOCAINE HCL (PF) 1 % IJ SOLN
30.0000 mL | INTRAMUSCULAR | Status: DC | PRN
  Filled 2014-06-15: qty 30

## 2014-06-15 MED ORDER — LACTATED RINGERS IV SOLN
500.0000 mL | Freq: Once | INTRAVENOUS | Status: AC
  Administered 2014-06-16: 500 mL via INTRAVENOUS

## 2014-06-15 MED ORDER — OXYTOCIN BOLUS FROM INFUSION: 500.0000 mL | INTRAVENOUS | Status: DC

## 2014-06-15 MED ORDER — ACETAMINOPHEN 325 MG PO TABS: 650.0000 mg | ORAL_TABLET | ORAL | Status: DC | PRN

## 2014-06-15 NOTE — H&P (Signed)
Kim [name] is a 39 y.o. [id number] at [redacted]w[redacted]d presenting for IOL. Pt notes rare contractions . Good fetal movement, No vaginal bleeding, not leaking fluid.  [facility] at [facility] since 6 wks - Di-DI twins, dated by 6 wk u/s - Femara induced pregnancy, h/o unexplained infertility - AMA. Nl Informaseq, nl AFP - h/o PTD vs cervical incompetence in prior preg. Mono-di twins with nl u/s and CL at 18 wk, delivery at 19 wks, unclear etiology, possible cervical incompetence. Last pregnancy decided against cerclage but opted for 17-P and baby ASA with post-dates delivery (IOL). This preg, after long discussion and review of evidence, mostly again 17-P for twin preg, decision made for 17-p until 36 wks in this pregnancy - anemia. On iron - Vulvar varicosities -Lung infections in pregnancy. Several rounds antibiotics, oral and inhaled steroids. Ampicillin, Azithro   Prenatal Transfer Tool  Maternal Diabetes: No Genetic Screening: Normal Maternal Ultrasounds/Referrals: Normal Fetal Ultrasounds or other Referrals:  None Maternal Substance Abuse:  No Significant Maternal Medications:  None Significant Maternal Lab Results: None     OB History    Gravida Para Term Preterm AB TAB SAB Ectopic Multiple Living   4 1 1  2  2   1      Past Medical History  Diagnosis Date  . Acid reflux   . Asthma    Past Surgical History  Procedure Laterality Date  . Dilation and curettage of uterus      2006 x1, 2009 x2  . Laparotomy  2008   Family History: family history includes Cancer in her sister; Hyperlipidemia in her father. Social History:  reports that she has never smoked. She does not have any smokeless tobacco history on file. She reports that she drinks alcohol. She reports that she does not use illicit drugs.  Review of Systems - Negative except discomfor of pregnancy,new edema     Blood pressure 123/77, pulse 76, temperature 98.6 F (37 C), temperature source Oral, resp. rate 18, height  5\' 9"  (1.753 m), weight 81.194 kg (179 lb).  Physical Exam: earlier on day of admission Gen: well appearing, no distress  Abd: gravid, NT, no RUQ pain LE: 3+ edema, equal bilaterally, non-tender U/s: A: post placenta, 6'12, 52%, BPP 8/8, nl AFI, vtx         B: ant placenta, 6'14, oblique, head maternal R, nl AFI, BPP 8/8  Prenatal labs: ABO, Rh: O/Positive/-- (08/17 0000) Antibody: Negative (08/17 0000) Rubella:  immune RPR: Nonreactive (08/17 0000)  HBsAg: Negative (08/17 0000)  HIV: Non-reactive (08/27 0000)  GBS: Negative (02/09 0000)  1 hr Glucola abnl, nl 3 hrs  Genetic screening nl Informaseq, nl AFP Anatomy US nl    Assessment/Plan: 39 y.o. [id number] at [redacted]w[redacted]d Plan for delivery at 38 wks given twin preg and AMA. Plan pitocin IOL with attempted vaginal delivery of twins. Pt aware oblique lie of baby B and risk of breech position and need for c/s if breech and unable to vert to vtx. Pt aware of plan for external or possibly internal version if breech.  GBS neg Reactive fetal testing thus far AMA   Von Inscoe A. 06/15/2014, 11:24 PM

## 2014-06-16 ENCOUNTER — Inpatient Hospital Stay (HOSPITAL_COMMUNITY): Payer: BC Managed Care – PPO | Admitting: Anesthesiology

## 2014-06-16 ENCOUNTER — Encounter (HOSPITAL_COMMUNITY): Payer: Self-pay | Admitting: *Deleted

## 2014-06-16 LAB — TYPE AND SCREEN
ABO/RH(D): O POS
Antibody Screen: NEGATIVE

## 2014-06-16 LAB — CBC
HCT: 31 % — ABNORMAL LOW (ref 36.0–46.0)
Hemoglobin: 10.3 g/dL — ABNORMAL LOW (ref 12.0–15.0)
MCH: 31.7 pg (ref 26.0–34.0)
MCHC: 33.2 g/dL (ref 30.0–36.0)
MCV: 95.4 fL (ref 78.0–100.0)
Platelets: 131 10*3/uL — ABNORMAL LOW (ref 150–400)
RBC: 3.25 MIL/uL — ABNORMAL LOW (ref 3.87–5.11)
RDW: 14.7 % (ref 11.5–15.5)
WBC: 4.1 10*3/uL (ref 4.0–10.5)

## 2014-06-16 LAB — RPR: RPR Ser Ql: NONREACTIVE

## 2014-06-16 MED ORDER — WITCH HAZEL-GLYCERIN EX PADS
1.0000 "application " | MEDICATED_PAD | CUTANEOUS | Status: DC | PRN
  Administered 2014-06-16: 1 via TOPICAL

## 2014-06-16 MED ORDER — LIDOCAINE HCL (PF) 1 % IJ SOLN
INTRAMUSCULAR | Status: DC | PRN
  Administered 2014-06-16: 6 mL
  Administered 2014-06-16: 4 mL

## 2014-06-16 MED ORDER — FLEET ENEMA 7-19 GM/118ML RE ENEM: 1.0000 | ENEMA | Freq: Every day | RECTAL | Status: DC | PRN

## 2014-06-16 MED ORDER — PRENATAL MULTIVITAMIN CH
1.0000 | ORAL_TABLET | Freq: Every day | ORAL | Status: DC
  Administered 2014-06-17 – 2014-06-18 (×2): 1 via ORAL
  Filled 2014-06-16 (×2): qty 1

## 2014-06-16 MED ORDER — ZOLPIDEM TARTRATE 5 MG PO TABS: 5.0000 mg | ORAL_TABLET | Freq: Every evening | ORAL | Status: DC | PRN

## 2014-06-16 MED ORDER — OXYCODONE-ACETAMINOPHEN 5-325 MG PO TABS
1.0000 | ORAL_TABLET | ORAL | Status: DC | PRN
  Administered 2014-06-17: 1 via ORAL
  Filled 2014-06-16: qty 1

## 2014-06-16 MED ORDER — DIBUCAINE 1 % RE OINT
1.0000 "application " | TOPICAL_OINTMENT | RECTAL | Status: DC | PRN
  Administered 2014-06-16: 1 via RECTAL
  Filled 2014-06-16: qty 28

## 2014-06-16 MED ORDER — BENZOCAINE-MENTHOL 20-0.5 % EX AERO
1.0000 "application " | INHALATION_SPRAY | CUTANEOUS | Status: DC | PRN
  Administered 2014-06-16: 1 via TOPICAL
  Filled 2014-06-16: qty 56

## 2014-06-16 MED ORDER — ONDANSETRON HCL 4 MG PO TABS: 4.0000 mg | ORAL_TABLET | ORAL | Status: DC | PRN

## 2014-06-16 MED ORDER — PNEUMOCOCCAL VAC POLYVALENT 25 MCG/0.5ML IJ INJ
0.5000 mL | INJECTION | INTRAMUSCULAR | Status: AC
  Administered 2014-06-17: 0.5 mL via INTRAMUSCULAR
  Filled 2014-06-16: qty 0.5

## 2014-06-16 MED ORDER — SENNOSIDES-DOCUSATE SODIUM 8.6-50 MG PO TABS
2.0000 | ORAL_TABLET | ORAL | Status: DC
  Administered 2014-06-17 (×2): 2 via ORAL
  Filled 2014-06-16 (×2): qty 2

## 2014-06-16 MED ORDER — ALBUTEROL SULFATE (2.5 MG/3ML) 0.083% IN NEBU: 3.0000 mL | INHALATION_SOLUTION | Freq: Four times a day (QID) | RESPIRATORY_TRACT | Status: DC | PRN

## 2014-06-16 MED ORDER — TETANUS-DIPHTH-ACELL PERTUSSIS 5-2.5-18.5 LF-MCG/0.5 IM SUSP: 0.5000 mL | Freq: Once | INTRAMUSCULAR | Status: DC

## 2014-06-16 MED ORDER — FENTANYL 2.5 MCG/ML BUPIVACAINE 1/10 % EPIDURAL INFUSION (WH - ANES): 14.0000 mL/h | INTRAMUSCULAR | Status: DC | PRN

## 2014-06-16 MED ORDER — FLUTICASONE PROPIONATE HFA 44 MCG/ACT IN AERO
1.0000 | INHALATION_SPRAY | Freq: Two times a day (BID) | RESPIRATORY_TRACT | Status: DC
  Filled 2014-06-16: qty 10.6

## 2014-06-16 MED ORDER — SIMETHICONE 80 MG PO CHEW
80.0000 mg | CHEWABLE_TABLET | ORAL | Status: DC | PRN
Start: 2014-06-16 — End: 2014-06-18

## 2014-06-16 MED ORDER — LANOLIN HYDROUS EX OINT: TOPICAL_OINTMENT | CUTANEOUS | Status: DC | PRN

## 2014-06-16 MED ORDER — BISACODYL 10 MG RE SUPP
10.0000 mg | Freq: Every day | RECTAL | Status: DC | PRN
Start: 2014-06-16 — End: 2014-06-18

## 2014-06-16 MED ORDER — IBUPROFEN 600 MG PO TABS
600.0000 mg | ORAL_TABLET | Freq: Four times a day (QID) | ORAL | Status: DC
  Administered 2014-06-16 – 2014-06-18 (×8): 600 mg via ORAL
  Filled 2014-06-16 (×8): qty 1

## 2014-06-16 MED ORDER — DIPHENHYDRAMINE HCL 25 MG PO CAPS: 25.0000 mg | ORAL_CAPSULE | Freq: Four times a day (QID) | ORAL | Status: DC | PRN

## 2014-06-16 MED ORDER — OXYCODONE-ACETAMINOPHEN 5-325 MG PO TABS: 2.0000 | ORAL_TABLET | ORAL | Status: DC | PRN

## 2014-06-16 MED ORDER — ONDANSETRON HCL 4 MG/2ML IJ SOLN: 4.0000 mg | INTRAMUSCULAR | Status: DC | PRN

## 2014-06-16 NOTE — Anesthesia Preprocedure Evaluation (Addendum)
Anesthesia Evaluation  Patient identified by MRN, date of birth, ID band Patient awake    Reviewed: Allergy & Precautions, H&P , Patient's Chart, lab work & pertinent test results  Airway Mallampati: II  TM Distance: >3 FB Neck ROM: full    Dental  (+) Teeth Intact   Pulmonary asthma ,  breath sounds clear to auscultation        Cardiovascular Rhythm:regular Rate:Normal     Neuro/Psych    GI/Hepatic GERD-  Medicated,  Endo/Other    Renal/GU      Musculoskeletal   Abdominal   Peds  Hematology   Anesthesia Other Findings       Reproductive/Obstetrics (+) Pregnancy                            Anesthesia Physical Anesthesia Plan  ASA: II  Anesthesia Plan: Epidural   Post-op Pain Management:    Induction:   Airway Management Planned:   Additional Equipment:   Intra-op Plan:   Post-operative Plan:   Informed Consent: I have reviewed the patients History and Physical, chart, labs and discussed the procedure including the risks, benefits and alternatives for the proposed anesthesia with the patient or authorized representative who has indicated his/her understanding and acceptance.   Dental Advisory Given  Plan Discussed with:   Anesthesia Plan Comments: (Labs checked- platelets confirmed with RN in room. Fetal heart tracing, per RN, reported to be stable enough for sitting procedure. Discussed epidural, and patient consents to the procedure:  included risk of possible headache,backache, failed block, allergic reaction, and nerve injury. This patient was asked if she had any questions or concerns before the procedure started.)        Anesthesia Quick Evaluation

## 2014-06-16 NOTE — Anesthesia Procedure Notes (Signed)

## 2014-06-16 NOTE — Progress Notes (Signed)
S: Doing well, no complaints, pain well controlled without epidural, rare ctx  O: BP 97/54 mmHg  Pulse 77  Temp(Src) 98.7 F (37.1 C) (Oral)  Resp 18  Ht 5\' 9"  (1.753 m)  Wt 81.194 kg (179 lb)  BMI 26.42 kg/m2   FHT:  FHR: 140s bpm, variability: moderate,  accelerations:  Present,  decelerations:  Absent X 2 UC:   irregular, every 8 minutes SVE:   Dilation: 4 Effacement (%): 50 Station: -2 Exam by:: Dr. Pamala Hurry  AROM- A- clear   A / P:  39 y.o.  Obstetric History   G4   P1   T1   P0   A2   TAB0   SAB2   E0   M0   L1    at [redacted]w[redacted]d IOL for AMA, twins, slow progress on pitocin, not yet in active labor, h/o rapid labor  Fetal Wellbeing:  Category I Pain Control:  planning natural labor, will have epidural in place ready for meds if needed based on position of B after A delivers  Anticipated MOD:  NSVD  Orlo Brickle A. 06/16/2014, 5:08 AM

## 2014-06-16 NOTE — Lactation Note (Signed)
This note was copied from the chart of [place]. Lactation Consultation Note Initial visit at 84 hours of age.  Baby is one of twins.  Baby boy has been breast feeding well.  MOm reports a strong suck.  Baby girl had a few good feedings and then took a long break and was sleepy.  Baby has had 2 good feedings this evening and mom denies concerns with feedings.  Mom reports previous experience with 41 year old of 15 months.  [facility] LC resources given and discussed.  Encouraged to feed with early cues on demand.  Early newborn behavior discussed.  Hand expression reported by mom with colostrum visible.  Mom to call for assist as needed.    Patient Name: Boy Georgianna Band [id number] Date: 06/16/2014 Reason for consult: Initial assessment;Multiple gestation   Maternal Data Has patient been taught Hand Expression?: Yes Does the patient have breastfeeding experience prior to this delivery?: Yes  Feeding    LATCH Score/Interventions                      Lactation Tools Discussed/Used     Consult Status Consult Status: Follow-up Date: 06/17/14 Follow-up type: In-patient    Eann Cleland, Justine Null 06/16/2014, 10:11 PM

## 2014-06-16 NOTE — Anesthesia Postprocedure Evaluation (Signed)
Anesthesia Post Note  Patient: Kim [name]  Procedure(s) Performed: * No procedures listed *  Anesthesia type: Epidural  Patient location: Mother/Baby  Post pain: Pain level controlled  Post assessment: Post-op Vital signs reviewed  Last Vitals:  Filed Vitals:   06/16/14 1340  BP: 124/74  Pulse: 64  Temp: 36.8 C  Resp: 18    Post vital signs: Reviewed  Level of consciousness:alert  Complications: No apparent anesthesia complications

## 2014-06-16 NOTE — Progress Notes (Addendum)
Difficulty tracing baby A due to pt moving around constantly. Monitor adjusted several times. Pt informed of importance of tracing and suggest IFSE. Pt refused at this time. Will continue to assess and adjust monitor

## 2014-06-17 LAB — CBC
HCT: 29.2 % — ABNORMAL LOW (ref 36.0–46.0)
Hemoglobin: 9.7 g/dL — ABNORMAL LOW (ref 12.0–15.0)
MCH: 31.7 pg (ref 26.0–34.0)
MCHC: 33.2 g/dL (ref 30.0–36.0)
MCV: 95.4 fL (ref 78.0–100.0)
Platelets: 114 10*3/uL — ABNORMAL LOW (ref 150–400)
RBC: 3.06 MIL/uL — ABNORMAL LOW (ref 3.87–5.11)
RDW: 14.6 % (ref 11.5–15.5)
WBC: 5.5 10*3/uL (ref 4.0–10.5)

## 2014-06-17 NOTE — Progress Notes (Signed)
PPD #1, SVD, Twins Boy and Girl   S:  Reports feeling good, but tired             Tolerating po/ No nausea or vomiting             Bleeding is light             Pain controlled with Motrin and Percocet             Up ad lib / ambulatory / voiding QS  Newborn breast feeding  - going well, breastfeeding both babies at the same time/ Circumcision - not planning  O:               VS: BP 102/66 mmHg  Pulse 63  Temp(Src) 98.6 F (37 C) (Oral)  Resp 18  Ht 5\' 9"  (1.753 m)  Wt 81.194 kg (179 lb)  BMI 26.42 kg/m2  SpO2 99%  Breastfeeding? Unknown   LABS:              Recent Labs  06/15/14 2300 06/17/14 0540  WBC 4.1 5.5  HGB 10.3* 9.7*  PLT 131* 114*               Blood type: --/--/O POS (03/01 2300)  Rubella: Immune (08/17 0000)                     I&O: Intake/Output      03/02 0701 - 03/03 0700 03/03 0701 - 03/04 0700   I.V. (mL/kg) 1048 (12.9)    Total Intake(mL/kg) 1048 (12.9)    Blood 550    Total Output 550     Net +498                        Physical Exam:             Alert and oriented X3  Lungs: Clear and unlabored  Heart: regular rate and rhythm / no mumurs  Abdomen: soft, non-tender, non-distended              Fundus: firm, non-tender, U-E  Perineum: well approximated L periurethral and R inferior labia laceration - healing well / slightly edematous / vulvar varicosity noted / no erythema / no ecchymosis   Lochia: light  Extremities: +2 dependent BLE edema, no calf pain or tenderness    A: PPD # 1, SVD, Twins   Dependent Edema   Vulvar Varicosity   Asthma - stable, has not required inhalers   ABL Anemia - stable, asymptomatic  Doing well - stable status  P: Routine post partum orders  Place Abdominal binder today  D/C IV today  Anticipate discharge home tomorrow  Kassie Mends, SNM

## 2014-06-17 NOTE — Lactation Note (Signed)
This note was copied from the chart of [place]. Lactation Consultation Note  Patient Name: Kim [name] PHXTA'V Date: 06/17/2014 Reason for consult: Follow-up assessment;Infant < 6lbs;Multiple gestation (early term infant twin) Mom has this twin (girl-B) latched in football position on her (L) breast.  Baby is at end of feeding but mom reports comfortable latch and intermittent swallows with sucking bursts at this feeding.  RN, Sharyn Lull had also reported improving latch for the girl twin.  Boy twin has recently fed and has been consistently latching well at most feedings. Both babies have output which exceeds minimum voids and stools for this hour of life.  Boy-A twin has LATCH scores of 9/10.  LC encouraged continued cue feedings and mom to call for breastfeeding concerns or needs.   Maternal Data    Feeding Feeding Type: Breast Fed  LATCH Score/Interventions Latch: Repeated attempts needed to sustain latch, nipple held in mouth throughout feeding, stimulation needed to elicit sucking reflex. Intervention(s): Skin to skin;Teach feeding cues  Audible Swallowing: A few with stimulation Intervention(s): Skin to skin Intervention(s): Hand expression  Type of Nipple: Everted at rest and after stimulation  Comfort (Breast/Nipple): Soft / non-tender     Hold (Positioning): No assistance needed to correctly position infant at breast.  LATCH Score: 8 (recent LATCH score per RN assessment)  Lactation Tools Discussed/Used   Cue feedings Signs of proper latch and milk transfer  Consult Status Consult Status: Follow-up Date: 06/18/14 Follow-up type: In-patient    Kim [name] [facility] 06/17/2014, 5:48 PM

## 2014-06-18 MED ORDER — IBUPROFEN 600 MG PO TABS: 600.0000 mg | ORAL_TABLET | Freq: Four times a day (QID) | ORAL | Status: DC | PRN

## 2014-06-18 NOTE — Discharge Summary (Signed)
Obstetric Discharge Summary Reason for Admission: induction of labor 38 wks with Di-Di twins Intrapartum Procedures: spontaneous vaginal delivery of both twins, cephalic.  Postpartum Procedures: none Complications-Operative and Postpartum: none HEMOGLOBIN  Date Value Ref Range Status  06/17/2014 9.7* 12.0 - 15.0 g/dL Final   HCT  Date Value Ref Range Status  06/17/2014 29.2* 36.0 - 46.0 % Final    Physical Exam:  General: alert, cooperative and appears stated age 39: appropriate Uterine Fundus: firm DVT Evaluation: No evidence of DVT seen on physical exam.  Discharge Diagnoses: Term Pregnancy-delivered twins without complications  Discharge Information: Date: 06/18/2014 Activity: pelvic rest Diet: routine Medications: Ibuprofen Condition: stable Instructions: refer to practice specific booklet Discharge to: home   Newborn Data:   Aubrina, Nieman [[id number]]  Live born female  Birth Weight: 7 lb 2.1 oz (3235 g) APGAR: 9, 9   Angenette, Daily [[id number]]  Live born female  Birth Weight: 6 lb 4 oz (2835 g) APGAR: 8, 9  Home - both twins with mother.  Nakyah Erdmann R 06/18/2014, 1:35 PM

## 2014-06-18 NOTE — Progress Notes (Signed)
PPD #2, SVD, Kathi Der Twins - Boy and Girl   S:  Reports feeling good, but tired - has not been getting much rest             Tolerating po/ Vomiting episode x 1 after 6am Motrin dose - has resolved, possibly r/t not eating much prior to medication dose              Bleeding is light             Pain controlled with Motrin and Percocet              Up ad lib / ambulatory / voiding QS  Newborn breast feeding with formula supplementation - currently working with lactation  / Circumcision - not planning  Concerned about BPs - states she usually runs low / denies HA, visual disturbances, epigastric pain   O:               VS: BP 135/76 mmHg  Pulse 58  Temp(Src) 98 F (36.7 C) (Oral)  Resp 20  Ht 5\' 9"  (1.753 m)  Wt 81.194 kg (179 lb)  BMI 26.42 kg/m2  SpO2 99%  Breastfeeding? Unknown   LABS:              Recent Labs  06/15/14 2300 06/17/14 0540  WBC 4.1 5.5  HGB 10.3* 9.7*  PLT 131* 114*               Blood type: --/--/O POS (03/01 2300)  Rubella: Immune (08/17 0000)                      Physical Exam:             Alert and oriented X3  Lungs: Clear and unlabored  Heart: regular rate and rhythm / no mumurs  Abdomen: soft, non-tender, non-distended              Fundus: firm, non-tender, U-1  Perineum: well approximated L periurethral and R inferior labia laceration - healing well / slightly edematous / vulvar varicosity noted - improving / no erythema / no ecchymosis   Lochia: light  Extremities: +2 dependent edema in BLE - improving, no calf pain or tenderness    A: PPD # 2, SVD, Kathi Der Twins    Dependent Edema - improving  ABL Anemia - stable, asymptomatic   Asthma - stable, has not required inhalers   Vulvar Varicosity - improving   Doing well - stable status  P: Discharge home today  Increase hydration and elevate legs when resting  Signs/Symptoms to call reviewed   Re-assured about BPs - she will get a BP check from [facility] Nurse - Call if HA/visual  disturbances/epigastric pain   May continue use of Abdominal Binder at home  Continue with Motrin and Percocet as needed   Continue Prenatal Vitamin   F/U in office with Dr. Pamala Hurry for 6 week PP visit   Kassie Mends, SNM    MD note Pt seen and assessed, agree with above note and plan.  V.Gao Mitnick MD

## 2014-06-18 NOTE — Discharge Instructions (Signed)

## 2014-06-18 NOTE — Lactation Note (Signed)
This note was copied from the chart of [place]. Lactation Consultation Note  Follow-up requested by RNs related to feedings.  Kim Kim(lee) is at the breast often and for extended periods.  He continues to be hungry after feedings.  I talked to his mother about introducing an SNS to increase the flow as often this can improve BF behavior.   He did not transfer from the SNS.  He also did not transfer when finger feeding was offered.  His upper lip does not flange and his lingual frenum is attached near the tip of the tongue and close to the gumline.  His tongue has a cleft in it with extension and does not elevate to maintain a seal. Snapback is heard.  He ate 27 ml from foley cup and became satisfied. He will be cup fed at home if needed Kim [name] is feeding better at the breast and with breast compression she was able to make the breast softer.  SHe ate for about 10 minutes and came off of the breast satisfied but within about 15 minutes she was hungry again.  Her mother offered her Alimentum via cup and she consumed more calories.  Her oral anatomy is similar to her brother's. SHe may benefit from an SNS or cup feeding Mom rented a symphony pump and will post pump 6 times a day for at least 10 minutes to protect her supply.   I called Dr. Laurice Record and notified him of the above.  Patient Name: Kim [name] VFIEP'P Date: 06/18/2014 Reason for consult: Follow-up assessment;Other (Comment) (Nurses concerned about feedings)   Maternal Data    Feeding Feeding Type: Formula Length of feed: 15 min  LATCH Score/Interventions Latch: Repeated attempts needed to sustain latch, nipple held in mouth throughout feeding, stimulation needed to elicit sucking reflex. Intervention(s): Assist with latch;Adjust position;Breast compression  Audible Swallowing: None Intervention(s): Skin to skin  Type of Nipple: Everted at rest and after stimulation  Comfort (Breast/Nipple): Soft  / non-tender     Hold (Positioning): No assistance needed to correctly position infant at breast. Intervention(s): Support Pillows  LATCH Score: 7  Lactation Tools Discussed/Used     Consult Status Consult Status: Follow-up Follow-up type: Out-patient    Van Clines 06/18/2014, 12:48 PM

## 2014-06-21 ENCOUNTER — Inpatient Hospital Stay (HOSPITAL_COMMUNITY): Admission: RE | Admit: 2014-06-21 | Payer: BC Managed Care – PPO | Source: Ambulatory Visit

## 2014-06-23 ENCOUNTER — Ambulatory Visit (HOSPITAL_COMMUNITY)
Admission: RE | Admit: 2014-06-23 | Discharge: 2014-06-23 | Disposition: A | Discharge status: Home / Self Care | Payer: BC Managed Care – PPO | Source: Ambulatory Visit | Attending: Obstetrics | Admitting: Obstetrics

## 2014-06-23 NOTE — Lactation Note (Signed)
Lactation Consult  Mother's reason for visit: Tongue restrictions and BF difficulty.  Parents have decided to wait on frenectomy for the twins.  Both babies are going to the breast on demand for the most part and also being supplemented.  Kim [name] is being supplemented with BM with a total of 15-30 oz.  Kim [name] being supplemented with BM also but only needs 10 - 16 ml over the course of 24 hours. Both have appropriate output and mom is pumping to maintain her supply.  Today we decided to try a NS #24.  [name] attached to it better and engaged longer.  He fell asleep after about 15 minutes and his total transfer was 24 ml.  He woke a few minutes later and attached to the other side (L).  He transferred another 18 for a total of 42.  Again he fell asleep and when he woke he was supplemented with about 20 ml of BM.   [name] was sleepy today and transferred 12 ml.  She woke up and became hungry as they were leaving so she was fed a small amount with the special needs feeder.  I recommended a special needs feeder to help them with suckling.  They both did well with this.  Lactation Consultant:  Van Clines  ________________________________________________________________________  [name] Name: Kim [name] Date of Birth: 06/16/2016 Pediatrician: Ram[place]oolam Gender: female Gestational Age: [redacted]w[redacted]d (At Birth) Birth Weight: 7 lb 2.1 oz (3235 g) Weight at Discharge: Weight: 6 lb 9.1 oz (2980 g)Date of Discharge: 06/18/2014 [facility] Weights   06/16/14 0956 06/17/14 0005 06/18/14 0037  Weight: 7 lb 2.1 oz (3235 g) 6 lb 14.8 oz (3140 g) 6 lb 9.1 oz (2980 g)    Weight today: 6#15.5 oz  3160g   Baby's Name: Kim [name] Date of Birth: 06/16/2016 Pediatrician: Ramgoolam Gender: female Gestational Age: [redacted]w[redacted]d (At Birth) Birth Weight: 6 lb 4 oz (2835 g) Weight at Discharge: Weight: 5 lb 11.5 oz (2595 g)Date of Discharge: 06/18/2014 Filed Weights   06/16/14 1004  06/16/14 2350 06/18/14 0038  Weight: 6 lb 4 oz (2835 g) 5 lb 15.4 oz (2705 g) 5 lb 11.5 oz (2595 g)    Weight today: 6# 2 oz 2780 g     ________________________________________________________________________  Mother's Name: Kim [name] Type of delivery:  Vaginal Breastfeeding Experience:  1st child over 1 year Maternal Medical Conditions:  NA Maternal Medications:  PNV,Vit,D Pribiotics,dandelion tea, fish oil, iron,   ________________________________________________________________________

## 2016-02-20 ENCOUNTER — Other Ambulatory Visit: Payer: Self-pay | Admitting: Family Medicine

## 2016-02-20 ENCOUNTER — Ambulatory Visit
Admission: RE | Admit: 2016-02-20 | Discharge: 2016-02-20 | Disposition: A | Discharge status: Home / Self Care | Payer: BC Managed Care – PPO | Source: Ambulatory Visit | Attending: Family Medicine | Admitting: Family Medicine

## 2016-02-20 DIAGNOSIS — J189 Pneumonia, unspecified organism: Secondary | ICD-10-CM

## 2017-10-01 ENCOUNTER — Other Ambulatory Visit: Payer: Self-pay | Admitting: Family Medicine

## 2017-10-01 DIAGNOSIS — R229 Localized swelling, mass and lump, unspecified: Secondary | ICD-10-CM

## 2017-10-09 ENCOUNTER — Ambulatory Visit
Admission: RE | Admit: 2017-10-09 | Discharge: 2017-10-09 | Disposition: A | Discharge status: Home / Self Care | Payer: BC Managed Care – PPO | Source: Ambulatory Visit | Attending: Family Medicine | Admitting: Family Medicine

## 2017-10-09 DIAGNOSIS — R229 Localized swelling, mass and lump, unspecified: Secondary | ICD-10-CM

## 2018-05-13 ENCOUNTER — Other Ambulatory Visit: Payer: Self-pay | Admitting: Family Medicine

## 2018-05-13 DIAGNOSIS — N6310 Unspecified lump in the right breast, unspecified quadrant: Secondary | ICD-10-CM

## 2018-05-14 ENCOUNTER — Other Ambulatory Visit: Payer: Self-pay | Admitting: Family Medicine

## 2018-05-14 ENCOUNTER — Ambulatory Visit
Admission: RE | Admit: 2018-05-14 | Discharge: 2018-05-14 | Disposition: A | Discharge status: Home / Self Care | Payer: BC Managed Care – PPO | Source: Ambulatory Visit | Attending: Family Medicine | Admitting: Family Medicine

## 2018-05-14 DIAGNOSIS — N6310 Unspecified lump in the right breast, unspecified quadrant: Secondary | ICD-10-CM

## 2018-05-21 ENCOUNTER — Ambulatory Visit
Admission: RE | Admit: 2018-05-21 | Discharge: 2018-05-21 | Disposition: A | Discharge status: Home / Self Care | Payer: BC Managed Care – PPO | Source: Ambulatory Visit | Attending: Family Medicine | Admitting: Family Medicine

## 2018-05-21 DIAGNOSIS — N6310 Unspecified lump in the right breast, unspecified quadrant: Secondary | ICD-10-CM

## 2018-05-22 ENCOUNTER — Other Ambulatory Visit: Payer: Self-pay | Admitting: Family Medicine

## 2018-05-22 DIAGNOSIS — C50911 Malignant neoplasm of unspecified site of right female breast: Secondary | ICD-10-CM

## 2018-05-23 ENCOUNTER — Encounter: Payer: Self-pay | Admitting: *Deleted

## 2018-05-23 HISTORY — PX: BREAST BIOPSY: SHX20

## 2018-05-26 ENCOUNTER — Other Ambulatory Visit: Payer: Self-pay | Admitting: *Deleted

## 2018-05-26 ENCOUNTER — Telehealth: Payer: Self-pay | Admitting: Hematology

## 2018-05-26 DIAGNOSIS — C50311 Malignant neoplasm of lower-inner quadrant of right female breast: Secondary | ICD-10-CM

## 2018-05-26 DIAGNOSIS — Z170 Estrogen receptor positive status [ER+]: Principal | ICD-10-CM

## 2018-05-26 DIAGNOSIS — C50511 Malignant neoplasm of lower-outer quadrant of right female breast: Secondary | ICD-10-CM

## 2018-05-26 DIAGNOSIS — C50811 Malignant neoplasm of overlapping sites of right female breast: Secondary | ICD-10-CM | POA: Insufficient documentation

## 2018-05-26 NOTE — Telephone Encounter (Signed)
Spoke to patient to confirm afternoon [facility] appointment, packet sent through email

## 2018-05-27 ENCOUNTER — Ambulatory Visit
Admission: RE | Admit: 2018-05-27 | Discharge: 2018-05-27 | Disposition: A | Discharge status: Home / Self Care | Payer: BC Managed Care – PPO | Source: Ambulatory Visit | Attending: Family Medicine | Admitting: Family Medicine

## 2018-05-27 ENCOUNTER — Other Ambulatory Visit: Payer: Self-pay | Admitting: Genetics

## 2018-05-27 ENCOUNTER — Other Ambulatory Visit: Payer: Self-pay | Admitting: Family Medicine

## 2018-05-27 DIAGNOSIS — Z170 Estrogen receptor positive status [ER+]: Principal | ICD-10-CM

## 2018-05-27 DIAGNOSIS — C50911 Malignant neoplasm of unspecified site of right female breast: Secondary | ICD-10-CM

## 2018-05-27 DIAGNOSIS — C50311 Malignant neoplasm of lower-inner quadrant of right female breast: Secondary | ICD-10-CM

## 2018-05-27 DIAGNOSIS — C50511 Malignant neoplasm of lower-outer quadrant of right female breast: Secondary | ICD-10-CM

## 2018-05-28 ENCOUNTER — Encounter: Payer: Self-pay | Admitting: Hematology

## 2018-05-28 ENCOUNTER — Inpatient Hospital Stay: Payer: BC Managed Care – PPO

## 2018-05-28 ENCOUNTER — Inpatient Hospital Stay: Payer: BC Managed Care – PPO | Attending: Hematology | Admitting: Hematology

## 2018-05-28 ENCOUNTER — Ambulatory Visit
Admission: RE | Admit: 2018-05-28 | Discharge: 2018-05-28 | Disposition: A | Discharge status: Home / Self Care | Payer: BC Managed Care – PPO | Source: Ambulatory Visit | Attending: Radiation Oncology | Admitting: Radiation Oncology

## 2018-05-28 ENCOUNTER — Ambulatory Visit: Payer: Self-pay | Admitting: General Surgery

## 2018-05-28 ENCOUNTER — Ambulatory Visit: Payer: BC Managed Care – PPO | Admitting: Physical Therapy

## 2018-05-28 ENCOUNTER — Encounter: Payer: Self-pay | Admitting: Physical Therapy

## 2018-05-28 ENCOUNTER — Other Ambulatory Visit: Payer: Self-pay

## 2018-05-28 VITALS — BP 96/73 | HR 57 | Temp 97.5°F | Resp 18 | Ht 69.0 in | Wt 136.7 lb

## 2018-05-28 DIAGNOSIS — Z170 Estrogen receptor positive status [ER+]: Principal | ICD-10-CM

## 2018-05-28 DIAGNOSIS — J45909 Unspecified asthma, uncomplicated: Secondary | ICD-10-CM | POA: Diagnosis not present

## 2018-05-28 DIAGNOSIS — Z5189 Encounter for other specified aftercare: Secondary | ICD-10-CM | POA: Insufficient documentation

## 2018-05-28 DIAGNOSIS — C50311 Malignant neoplasm of lower-inner quadrant of right female breast: Secondary | ICD-10-CM

## 2018-05-28 DIAGNOSIS — Z5112 Encounter for antineoplastic immunotherapy: Secondary | ICD-10-CM | POA: Diagnosis not present

## 2018-05-28 DIAGNOSIS — C50511 Malignant neoplasm of lower-outer quadrant of right female breast: Secondary | ICD-10-CM

## 2018-05-28 DIAGNOSIS — Z5111 Encounter for antineoplastic chemotherapy: Secondary | ICD-10-CM | POA: Diagnosis present

## 2018-05-28 DIAGNOSIS — C50911 Malignant neoplasm of unspecified site of right female breast: Secondary | ICD-10-CM

## 2018-05-28 DIAGNOSIS — C50811 Malignant neoplasm of overlapping sites of right female breast: Secondary | ICD-10-CM

## 2018-05-28 DIAGNOSIS — C773 Secondary and unspecified malignant neoplasm of axilla and upper limb lymph nodes: Secondary | ICD-10-CM | POA: Diagnosis not present

## 2018-05-28 LAB — CMP (CANCER CENTER ONLY)
ALT: 10 U/L (ref 0–44)
AST: 8 U/L — ABNORMAL LOW (ref 15–41)
Albumin: 4.6 g/dL (ref 3.5–5.0)
Alkaline Phosphatase: 57 U/L (ref 38–126)
Anion gap: 8 (ref 5–15)
BUN: 13 mg/dL (ref 6–20)
CO2: 28 mmol/L (ref 22–32)
Calcium: 9.2 mg/dL (ref 8.9–10.3)
Chloride: 105 mmol/L (ref 98–111)
Creatinine: 0.8 mg/dL (ref 0.44–1.00)
GFR, Est AFR Am: >60 mL/min (ref >60)
GFR, Estimated: >60 mL/min (ref >60)
Glucose, Bld: 87 mg/dL (ref 70–99)
Potassium: 4.2 mmol/L (ref 3.5–5.1)
Sodium: 141 mmol/L (ref 135–145)
Total Bilirubin: 0.7 mg/dL (ref 0.3–1.2)
Total Protein: 7.6 g/dL (ref 6.5–8.1)

## 2018-05-28 LAB — CBC WITH DIFFERENTIAL (CANCER CENTER ONLY)
Abs Immature Granulocytes: 0.01 10*3/uL (ref 0.00–0.07)
Basophils Absolute: 0.1 10*3/uL (ref 0.0–0.1)
Basophils Relative: 2 %
Eosinophils Absolute: 0.1 10*3/uL (ref 0.0–0.5)
Eosinophils Relative: 3 %
HCT: 40.8 % (ref 36.0–46.0)
Hemoglobin: 13.5 g/dL (ref 12.0–15.0)
Immature Granulocytes: 0 %
Lymphocytes Relative: 36 %
Lymphs Abs: 1.5 10*3/uL (ref 0.7–4.0)
MCH: 31 pg (ref 26.0–34.0)
MCHC: 33.1 g/dL (ref 30.0–36.0)
MCV: 93.8 fL (ref 80.0–100.0)
Monocytes Absolute: 0.3 10*3/uL (ref 0.1–1.0)
Monocytes Relative: 6 %
Neutro Abs: 2.2 10*3/uL (ref 1.7–7.7)
Neutrophils Relative %: 53 %
Platelet Count: 203 10*3/uL (ref 150–400)
RBC: 4.35 MIL/uL (ref 3.87–5.11)
RDW: 13.1 % (ref 11.5–15.5)
WBC Count: 4.1 10*3/uL (ref 4.0–10.5)
nRBC: 0 % (ref 0.0–0.2)

## 2018-05-28 NOTE — Therapy (Signed)
[place], [place], [address] Phone: [phone]   Fax:  [phone]  Physical Therapy Evaluation  Patient Details  Name: Kim [name] MRN: [id number] Date of Birth: [birth year] Referring Provider (PT): Dr. Autumn Messing   Encounter Date: 05/28/2018  PT End of Session - 05/28/18 1936    Visit Number  1    Number of Visits  1    PT Start Time  1540    PT Stop Time  1618    PT Time Calculation (min)  38 min    Activity Tolerance  Patient tolerated treatment well    Behavior During Therapy  [facility] for tasks assessed/performed       Past Medical History:  Diagnosis Date  . Acid reflux   . Acute bronchitis   . Anemia   . Asthma   . Endometritis   . Migraine     Past Surgical History:  Procedure Laterality Date  . BREAST BIOPSY Right 05/23/2018   x3, malignant  . DILATION AND CURETTAGE OF UTERUS     2006 x1, 2009 x2  . LAPAROTOMY  2008    There were no vitals filed for this visit.   Subjective Assessment - 05/28/18 1926    Subjective  Patient reports she is here today to be seen by her medical team for her newly diagnosed right breast cancer.    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 There are several areas: 3.8 cm in the lower inner quadrant, 1.2 cm in the lower outer quadrant, and 2 areas of calcifications. The 2 masses are ER/PR positive and HER2 negative, but there is a biopsied positive axillary lymph node that is HER2 positive. The masses have a Ki67 of 10%. She has no other medical problems.    Patient Stated Goals  Reduce lymphedema risk and learn post op shoulder ROM HEP    Currently in Pain?  No/denies         [facility] PT Assessment - 05/28/18 0001      Assessment   Medical Diagnosis  Right breast cancer    Referring Provider (PT)  Dr. Autumn Messing    Onset  Date/Surgical Date  05/14/18    Hand Dominance  Right    Prior Therapy  None      Precautions   Precautions  Other (comment)    Precaution Comments  active cancer      Restrictions   Weight Bearing Restrictions  No      Balance Screen   Has the patient fallen in the past 6 months  No    Has the patient had a decrease in activity level because of a fear of falling?   No    Is the patient reluctant to leave their home because of a fear of falling?   No      Home Social worker  Private residence    Living Arrangements  Spouse/significant other;Children   Husband, 70 y.o. daughter, and 60 y.o. 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[employer]; professional violinist    Leisure  Moderate level interval training 20 min/day (infrequent)  Cognition   Overall Cognitive Status  Within Functional Limits for tasks assessed      Posture/Postural Control   Posture/Postural Control  No significant limitations      ROM / Strength   AROM / PROM / Strength  AROM;Strength      AROM   AROM Assessment Site  Shoulder;Cervical    Right/Left Shoulder  Right;Left    Right Shoulder Extension  50 Degrees    Right Shoulder Flexion  159 Degrees    Right Shoulder ABduction  168 Degrees    Right Shoulder Internal Rotation  70 Degrees    Right Shoulder External Rotation  90 Degrees    Left Shoulder Extension  58 Degrees    Left Shoulder Flexion  154 Degrees    Left Shoulder ABduction  166 Degrees    Left Shoulder Internal Rotation  54 Degrees    Left Shoulder External Rotation  90 Degrees    Cervical Flexion  WNL    Cervical Extension  WNL    Cervical - Right Side Bend  WNL    Cervical - Left Side Bend  WNL    Cervical - Right Rotation  WNL    Cervical - Left Rotation  WNL      Strength   Overall Strength  Within  functional limits for tasks performed        LYMPHEDEMA/ONCOLOGY QUESTIONNAIRE - 05/28/18 1934      Type   Cancer Type  Right breast cancer      Lymphedema Assessments   Lymphedema Assessments  Upper extremities      Right Upper Extremity Lymphedema   10 cm Proximal to Olecranon Process  26.3 cm    Olecranon Process  24 cm    10 cm Proximal to Ulnar Styloid Process  20.6 cm    Just Proximal to Ulnar Styloid Process  15.2 cm    Across Hand at [employer]  19.2 cm    At [place] of 2nd Digit  6.3 cm      Left Upper Extremity Lymphedema   10 cm Proximal to Olecranon Process  25.7 cm    Olecranon Process  24.3 cm    10 cm Proximal to Ulnar Styloid Process  20.3 cm    Just Proximal to Ulnar Styloid Process  15.2 cm    Across Hand at [employer]  19.8 cm    At [place] of 2nd Digit  6.3 cm          Quick Dash - 05/28/18 0001    Open a tight or new jar  No difficulty    Do heavy household chores (wash walls, wash floors)  No difficulty    Carry a shopping bag or briefcase  No difficulty    Wash your back  No difficulty    Use a knife to cut food  No difficulty    Recreational activities in which you take some force or impact through your arm, shoulder, or hand (golf, hammering, tennis)  No difficulty    During the past week, to what extent has your arm, shoulder or hand problem interfered with your normal social activities with family, friends, neighbors, or groups?  Not at all    During the past week, to what extent has your arm, shoulder or hand problem limited your work or other regular daily activities  Not at all    Arm, shoulder, or hand pain.  None    Tingling (pins and needles) in your  arm, shoulder, or hand  None    Difficulty Sleeping  No difficulty    DASH Score  0 %        Objective measurements completed on examination: See above findings.     Patient was instructed today in a home exercise program today for post op shoulder range of motion. These included  active assist shoulder flexion in sitting, scapular retraction, wall walking with shoulder abduction, and hands behind head external rotation.  She was encouraged to do these twice a day, holding 3 seconds and repeating 5 times when permitted by her physician.       PT Education - 05/28/18 1936    Education Details  Lymphedema risk reduction and post op shoulder ROM HEP    Person(s) Educated  Patient;Spouse    Methods  Explanation;Demonstration;Handout    Comprehension  Returned demonstration;Verbalized understanding           Breast Clinic Goals - 05/28/18 1952      Patient will be able to verbalize understanding of pertinent lymphedema risk reduction practices relevant to her diagnosis specifically related to skin care.   Time  1    Period  Days    Status  Achieved      Patient will be able to return demonstrate and/or verbalize understanding of the post-op home exercise program related to regaining shoulder range of motion.   Time  1    Period  Days    Status  Achieved      Patient will be able to verbalize understanding of the importance of attending the postoperative After Breast Cancer Class for further lymphedema risk reduction education and therapeutic exercise.   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 There are several areas: 3.8 cm in the lower inner quadrant, 1.2 cm in the lower outer quadrant, and 2 areas of calcifications. The 2 masses are ER/PR positive and HER2 negative, but there is a biopsied positive axillary lymph node that is HER2 positive. The masses have a Ki67 of 10%. She has no other medical problems. Her multidisciplinary medical team met prior to her assessments to determine a recommended treatment plan. She is planning to have neoadjuvant chemotherapy followed by a right  mastectomy and axillary lymph node dissection, radiation, and anti-estrogen therapy. She will benefit from post op PT to regain shoulder ROM and function and to reduce her risk of lymphedema.    History and Personal Factors relevant to plan of care:  Plays violin professionally and her dominant arm will be at high risk for lymphedema    Clinical Presentation  Evolving    Clinical Presentation due to:  Unknown extent of disease until staging scans are completed    Clinical Decision Making  Low    Rehab Potential  Excellent    Clinical Impairments Affecting Rehab Potential  None    PT Frequency  One time visit    PT Treatment/Interventions  ADLs/Self Care Home Management;Patient/family education;Therapeutic exercise    PT Next Visit Plan  Will reassess post op if referred by MD    PT Home Exercise Plan  Post op shoulder ROM HEP    Consulted and Agree with Plan of Care  Patient       Patient will  benefit from skilled therapeutic intervention in order to improve the following deficits and impairments:  Decreased range of motion, Impaired UE functional use, Pain, Decreased knowledge of precautions, Postural dysfunction  Visit Diagnosis: Malignant neoplasm of lower-inner quadrant of right breast of female, estrogen receptor positive ([place]) - Plan: PT plan of care cert/re-cert   Patient will follow up at outpatient cancer rehab 3-4 weeks following surgery.  If the patient requires physical therapy at that time, a specific plan will be dictated and sent to the referring physician for approval. The patient was educated today on appropriate basic range of motion exercises to begin post operatively and the importance of attending the After Breast Cancer class following surgery.  Patient was educated today on lymphedema risk reduction practices as it pertains to recommendations that will benefit the patient immediately following surgery.  She verbalized good understanding.      Problem List Patient  Active Problem List   Diagnosis Date Noted  . 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 Malignant neoplasm of lower-inner quadrant of right breast of female, estrogen receptor positive ([place]) 05/26/2018  . Malignant neoplasm of lower-outer quadrant of right breast of female, estrogen receptor positive ([place]) 05/26/2018  . Postpartum care following vaginal delivery -twins (3/2) 06/17/2014  . Dichorionic diamniotic twin pregnancy 06/15/2014   Annia Friendly, PT 05/28/18 7:56 PM  [place], [place], [address] Phone: [phone]   Fax:  [phone]  Name: Shaquandra Galano MRN: [id number] Date of Birth: [birth year]

## 2018-05-28 NOTE — Progress Notes (Signed)
START ON PATHWAY REGIMEN - Breast     A cycle is every 21 days:     Pertuzumab      Pertuzumab      Trastuzumab-xxxx      Trastuzumab-xxxx      Carboplatin      Docetaxel   **Always confirm dose/schedule in your pharmacy ordering system**  Patient Characteristics: Preoperative or Nonsurgical Candidate (Clinical Staging), Neoadjuvant Therapy followed by Surgery, Invasive Disease, Chemotherapy, HER2 Positive, ER Positive Therapeutic Status: Preoperative or Nonsurgical Candidate (Clinical Staging) AJCC M Category: cM0 AJCC Grade: G3 Breast Surgical Plan: Neoadjuvant Therapy followed by Surgery ER Status: Positive (+) AJCC 8 Stage Grouping: IB HER2 Status: Positive (+) AJCC T Category: cT2 AJCC N Category: cN1 PR Status: Positive (+) Intent of Therapy: Curative Intent, Discussed with Patient 

## 2018-05-28 NOTE — Progress Notes (Addendum)
[place]   Telephone:([phone]) [phone] Fax:([phone]) [place] Note   Patient Care Team: [name], MD as PCP - General (Family Medicine) [name], MD as Consulting Physician (General Surgery) [name], MD as Consulting Physician (Hematology) [name], MD as Consulting Physician (Radiation Oncology) [name], MD as Consulting Physician (Obstetrics and Gynecology) [name], RN as Oncology Nurse Navigator [name], RN as Oncology Nurse Navigator  Date of Service:  05/28/2018   CHIEF COMPLAINTS/PURPOSE OF CONSULTATION:  Newly diagnosed multifocal right breast cancer    Oncology History   Cancer Staging Cancer of overlapping sites of right female breast [facility]) Staging form: Breast, AJCC 8th Edition - Clinical stage from 05/28/2018: Stage IB (cT2, cN1, cM0, G3, ER+, PR+, HER2+) - Signed by [name], MD on 05/28/2018  Malignant neoplasm of lower-outer quadrant of right breast of female, estrogen receptor positive ([place]) Staging form: Breast, AJCC 8th Edition - Clinical stage from 05/28/2018: Stage IIB (cT2, cN1, cM0, G3, ER+, PR+, HER2-) - Unsigned       Cancer of overlapping sites of right female breast ([place])   05/14/2018 Mammogram    Right breast mammogram 05/14/18  IMPRESSION  There is a 1.0 x 0.5 x 1.2 cm lobular hypoechoic mass right breast 7 o'clock position 3 cm from nipple, a 0.8 x 0.6 x 1.2 cm lobular hypoechoic mass right breast 7 o'clock position 2 cm from the nipple, and a 0.9 x 0.5 x 0.8 cm lobular hypoechoic mass right breast 5 o'clock position 3 cm from nipple.  There is a 3.8 x 1.0 x 1.3 cm lobular hypoechoic mass right breast 5 o'clock position 2 cm from nipple.  Multiple thickened right axillary lymph nodes (approximately 15). Measuring up to 1.0 cm in thickness. Adenopathy corresponds with axillary palpable abnormality.  Indeterminate round and punctate calcifications upper-outer quadrant  right breast.Indeterminate round and punctate calcifications upper-outer quadrant right breast.    05/21/2018 Initial Biopsy    Diagnosis 05/21/18  1. Breast, right, needle core biopsy, axilla, 5 o'clock, ribbon clip - INVASIVE MAMMARY CARCINOMA. - MAMMARY CARCINOMA IN SITU. 2. Breast, right, needle core biopsy, 7 o'clock, coil clip - INVASIVE MAMMARY CARCINOMA. - MAMMARY CARCINOMA IN SITU. 3. Lymph node, needle/core biopsy, right axillary LN, hydromark - METASTATIC MAMMARY CARCINOMA.     05/21/2018 Receptors her2    1. PROGNOSTIC INDICATORS Results: The tumor cells are EQUIVOCAL for Her2 (2+);  HER2 **NEGATIVE** by FISH   Estrogen Receptor: 80%, POSITIVE, MODERATE STAINING INTENSITY Progesterone Receptor: 5%, POSITIVE, MODERATE-WEAK STAINING INTENSITY Proliferation Marker Ki67: 10%  3. PROGNOSTIC INDICATORS Results: The tumor cells are POSITIVE for Her2 (3+). Estrogen Receptor: 90%, POSITIVE, STRONG STAINING INTENSITY Progesterone Receptor: 50%, POSITIVE, MODERATE STAINING INTENSITY Proliferation Marker Ki67: 5%    05/26/2018 Initial Diagnosis    Malignant neoplasm of lower-inner quadrant of right breast of female, estrogen receptor positive ([place])    05/27/2018 Mammogram    Left breast mammogram 05/27/18  IMPRESSION: No mammographic evidence of LEFT breast malignancy.    05/28/2018 Cancer Staging    Staging form: Breast, AJCC 8th Edition - Clinical stage from 05/28/2018: Stage IB (cT2, cN1, cM0, G3, ER+, PR+, HER2+) - Signed by [name], MD on 05/28/2018      HISTORY OF PRESENTING ILLNESS:  Kim [name] 43 y.o. female is a here because of newly diagnosed right breast cancer. The patient presents to the clinic today accompanied by her husband.   She found mass by feeling  the lump herself. She notes she had history of lumpy breast with recurrent cyst. The first time she felt the right breast lump as grandular like feeling was in 03/2018 around the holidays. She denies skin,  shape or nipple change. She notes having recurrent bronchitis and noticed her cervical LN were enlarged and took antibiotics. She later felt her right axillary LN. She saw her PCP who ordered her a mammogram which showed the masses in her right breast. She notes her LN feel bigger now possibly from biopsy.  Her last mammogram was in 2019 at [facility], Dr. [name] and was negative. The one before that was in 2006 after she felt a lump in her left breast and biopsy was done tat the time and benign.   Today the patient notes she saw her PCP last week who treated her asthma. She is breathing better now and thinks her cervical lymph nodes are still enlarged. She notes she has not been eating as much lately due to stress.  Her husband is open to second opinion at [facility] and plan to get an appointment with Dr. Genella Rife next week. Her husband is interested in knowing in fasting or fasting mimicking diet with treatment. She already eats a mainly pescatarian diet. Her husband's main concern is curing her and she wants the same and is fine to give up the [place] if needed. She feels she has a good support system around her.   Socially she is remarried with 3 children, 61 six year old and 63 year old twins. She went through fertility with [facility] to help her get pregnant. In her first marriage she went through more fertility and has a history of miscarriage. 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 She drinks alcohol 3-4 times a week and is a non-smoker overall.  They have a PMHx of asthma and recurrent bronchitis. She had h/o neuropathy and carpal tunnel when pregnant. She had a large lipoma removed from her neck in 2019 which was benign. She takes Xanax, Ambien and propranolol as needed for any racing heart rate, tremors or anxiety.  Her sister was diagnosed with oligoastrocytoma at 45 yo. Her maternal aunt had pituitary cancer and her maternal grandfather had prostate cancer. She had a great aunt with breast  cancer.     GYN HISTORY  Menarchal: 10 LMP: 2016 Contraceptive: Mirena IUD since 2013. H/o birth control pills for 5-7 years HRT: No  G66: 45 year old and 45 year old twins: 43 yo at first birth, breast fed.     REVIEW OF SYSTEMS:    Constitutional: Denies fevers, chills or abnormal night sweats Eyes: Denies blurriness of vision, double vision or watery eyes Ears, nose, mouth, throat, and face: Denies mucositis or sore throat Respiratory: Denies cough, dyspnea or wheezes Cardiovascular: Denies palpitation, chest discomfort or lower extremity swelling Gastrointestinal:  Denies nausea, heartburn or change in bowel habits Skin: Denies abnormal skin rashes Lymphatics: Denies new lymphadenopathy or easy bruising Neurological:Denies numbness, tingling or new weaknesses Behavioral/Psych: Mood is stable, no new changes  BREAST: (+) Palpable right breast mass and right axillary LN  All other systems were reviewed with the patient and are negative.   MEDICAL HISTORY:  Past Medical History:  Diagnosis Date  . Acid reflux   . Acute bronchitis   . Anemia   . Asthma   . Endometritis   . Migraine     SURGICAL HISTORY: Past Surgical History:  Procedure Laterality Date  . BREAST BIOPSY  Right 05/23/2018   x3, malignant  . DILATION AND CURETTAGE OF UTERUS     2006 x1, 2009 x2  . LAPAROTOMY  2008    SOCIAL HISTORY: Social History   Socioeconomic History  . Marital status: Married    Spouse name: Not on file  . Number of children: 3  . Years of education: Not on file  . Highest education level: Not on file  Occupational History  . Not on file  Social Needs  . Financial resource strain: Not on file  . Food insecurity:    Worry: Not on file    Inability: Not on file  . Transportation needs:    Medical: Not on file    Non-medical: Not on file  Tobacco Use  . Smoking status: Never Smoker  . Smokeless tobacco: Never Used  Substance and Sexual Activity  . Alcohol use: Yes     Comment: 3-4 drinks a week, wine and beer   . Drug use: No  . Sexual activity: Not Currently    Birth control/protection: None  Lifestyle  . Physical activity:    Days per week: Not on file    Minutes per session: Not on file  . Stress: Not on file  Relationships  . Social connections:    Talks on phone: Not on file    Gets together: Not on file    Attends religious service: Not on file    Active member of club or organization: Not on file    Attends meetings of clubs or organizations: Not on file    Relationship status: Not on file  . Intimate partner violence:    Fear of current or ex partner: Not on file    Emotionally abused: Not on file    Physically abused: Not on file    Forced sexual activity: Not on file  Other Topics Concern  . Not on file  Social History Narrative  . Not on file    FAMILY HISTORY: Family History  Problem Relation Age of Onset  . Hyperlipidemia Father   . Cancer Sister 30       Brain  . Cancer Maternal Aunt        pitutory cancer  . Cancer Other        breast cancer   . Prostate cancer Maternal Grandfather     ALLERGIES:  has No Known Allergies.  MEDICATIONS:  Current Outpatient Medications  Medication Sig Dispense Refill  . albuterol (PROVENTIL HFA;VENTOLIN HFA) 108 (90 BASE) MCG/ACT inhaler Inhale 2 puffs into the lungs every 6 (six) hours as needed for wheezing or shortness of breath.    . ALPRAZolam (XANAX) 0.5 MG tablet Take 0.5 mg by mouth daily as needed for anxiety. 3 to 5 times month    . calcium carbonate (TUMS - DOSED IN MG ELEMENTAL CALCIUM) 500 MG chewable tablet Chew 1 tablet by mouth daily as needed. heartburn    . cetirizine (ZYRTEC) 10 MG tablet Take 10 mg by mouth daily as needed for allergies.    . fish oil-omega-3 fatty acids 1000 MG capsule Take 1 g by mouth daily.     Marland Kitchen levonorgestrel (MIRENA) 20 MCG/24HR IUD by Intrauterine route.    . Probiotic Product (PROBIOTIC DAILY PO) Take 1 capsule by mouth daily.     .  propranolol (INDERAL) 10 MG tablet Take 5 mg by mouth daily as needed. Takes as needed for shaking hand less than 10 times per year     No  current facility-administered medications for this visit.     PHYSICAL EXAMINATION: ECOG PERFORMANCE STATUS: 0 - Asymptomatic  Vitals:   05/28/18 1309  BP: 96/73  Pulse: (!) 57  Resp: 18  Temp: (!) 97.5 F (36.4 C)  SpO2: 100%   Filed Weights   05/28/18 1309  Weight: 136 lb 11.2 oz (62 kg)    GENERAL:alert, no distress and comfortable SKIN: skin color, texture, turgor are normal, no rashes or significant lesions EYES: normal, conjunctiva are pink and non-injected, sclera clear OROPHARYNX:no exudate, no erythema and lips, buccal mucosa, and tongue normal  NECK: supple, thyroid normal size, non-tender, without nodularity LYMPH:  no palpable lymphadenopathy in the axillary or inguinal (+) 2 slightly palpable cervical LN, likely from recent bronchitis LUNGS: clear to auscultation and percussion with normal breathing effort HEART: regular rate & rhythm and no murmurs and no lower extremity edema ABDOMEN:abdomen soft, non-tender and normal bowel sounds Musculoskeletal:no cyanosis of digits and no clubbing (+)no organomegaly  PSYCH: alert & oriented x 3 with fluent speech NEURO: no focal motor/sensory deficits BREAST: (+) Skin ecchymosis at biopsy sites (+) Lumpy breast tissue (+) 2.5x3.5cm mass at 6-7:00 position of right breast (+) 1.5cm palpable right axillary LN, movable. No palpable mass or adenopathy of left breast or axilla   LABORATORY DATA:  I have reviewed the data as listed CBC Latest Ref Rng & Units 05/28/2018 06/17/2014 06/15/2014  WBC 4.0 - 10.5 K/uL 4.1 5.5 4.1  Hemoglobin 12.0 - 15.0 g/dL 13.5 9.7(L) 10.3(L)  Hematocrit 36.0 - 46.0 % 40.8 29.2(L) 31.0(L)  Platelets 150 - 400 K/uL 203 114(L) 131(L)    CMP Latest Ref Rng & Units 05/28/2018  Glucose 70 - 99 mg/dL 87  BUN 6 - 20 mg/dL 13  Creatinine 0.44 - 1.00 mg/dL 0.80  Sodium  135 - 145 mmol/L 141  Potassium 3.5 - 5.1 mmol/L 4.2  Chloride 98 - 111 mmol/L 105  CO2 22 - 32 mmol/L 28  Calcium 8.9 - 10.3 mg/dL 9.2  Total Protein 6.5 - 8.1 g/dL 7.6  Total Bilirubin 0.3 - 1.2 mg/dL 0.7  Alkaline Phos 38 - 126 U/L 57  AST 15 - 41 U/L 8(L)  ALT 0 - 44 U/L 10     RADIOGRAPHIC STUDIES: I have personally reviewed the radiological images as listed and agreed with the findings in the report. US Breast Ltd Uni Right Inc Axilla  Result Date: 05/14/2018 CLINICAL DATA:  Patient presents for palpable abnormality within the lower inner right breast as well as palpable fullness within the right axilla. EXAM: DIGITAL DIAGNOSTIC RIGHT MAMMOGRAM WITH CAD AND TOMO ULTRASOUND RIGHT BREAST COMPARISON:  Previous exam(s). ACR Breast Density Category c: The breast tissue is heterogeneously dense, which may obscure small masses. FINDINGS: Within the 6 o'clock position right breast middle to posterior depth there is a new 9 mm group of coarse heterogeneous calcifications, further evaluated with magnification CC and true lateral views. Additionally within the upper-outer right breast there is a 1.7 cm group of round and punctate calcifications, further evaluated with magnification views. Dense tissue is demonstrated underlying the palpable marker within the lower inner right breast. Oval circumscribed mass within the upper-outer right breast. Mammographic images were processed with CAD. On physical exam, there is a palpable mass within the lower inner right breast. Palpable lymph nodes are demonstrated within the right axilla. Targeted ultrasound is performed, showing there is a 5 mm simple cyst right breast 11 o'clock position 2 cm from nipple, corresponding with mammographically  identified mass. At the site of palpable concern there are multiple hypoechoic masses as well as a large area of hypoechoic breast tissue. There is a 1.0 x 0.5 x 1.2 cm lobular hypoechoic mass right breast 7 o'clock position 3  cm from nipple, a 0.8 x 0.6 x 1.2 cm lobular hypoechoic mass right breast 7 o'clock position 2 cm from the nipple, and a 0.9 x 0.5 x 0.8 cm lobular hypoechoic mass right breast 5 o'clock position 3 cm from nipple. There is a 3.8 x 1.0 x 1.3 cm lobular hypoechoic mass right breast 5 o'clock position 2 cm from nipple. Multiple thickened right axillary lymph nodes (approximately 15). Measuring up to 1.0 cm in thickness. Adenopathy corresponds with axillary palpable abnormality. IMPRESSION: 1. Multiple lobular hypoechoic masses and large area of hypoechoic tissue within the inferior right breast at the site of palpable concern. 2. Multiple abnormal appearing cortically thickened right axillary lymph nodes. 3. Indeterminate coarse heterogeneous right breast calcifications. 4. Indeterminate round and punctate calcifications upper-outer quadrant right breast. RECOMMENDATION: 1. Ultrasound-guided core needle biopsy right breast mass 7 o'clock position 3 cm from the nipple. 2. Ultrasound-guided core needle biopsy right breast mass 5 o'clock position 3 cm from nipple. 3. Ultrasound-guided core needle biopsy cortically thickened right axillary lymph node. 4. Overall findings are concerning for extensive breast malignancy and metastatic adenopathy. If pathology from these biopsy sites is consistent with malignancy, further evaluation with breast MRI is recommended for extent of disease. 5. If no MRI is obtained, recommend stereotactic guided core needle biopsy of the suspicious right breast calcifications 6 o'clock position, right breast calcifications upper-outer quadrant as well as ultrasound-guided core needle biopsy of the large area of irregular hypoechoic tissue right breast 5 o'clock position 2 cm from nipple. I have discussed the findings and recommendations with the patient. Results were also provided in writing at the conclusion of the visit. If applicable, a reminder letter will be sent to the patient regarding the  next appointment. BI-RADS CATEGORY  5: Highly suggestive of malignancy. Electronically Signed   By: Lovey Newcomer M.D.   On: 05/14/2018 15:20   Mm Diag Breast Tomo Uni Left  Result Date: 05/27/2018 CLINICAL DATA:  43 year old female for evaluation of LEFT breast. Recent diagnosis of RIGHT breast cancer with metastasis to RIGHT axillary lymph nodes. EXAM: DIGITAL DIAGNOSTIC UNILATERAL LEFT MAMMOGRAM WITH CAD AND TOMO COMPARISON:  Previous exam(s). ACR Breast Density Category c: The breast tissue is heterogeneously dense, which may obscure small masses. FINDINGS: 2D/3D full field and spot compression views of the LEFT breast demonstrate no suspicious mass, distortion or worrisome calcifications. Mammographic images were processed with CAD. IMPRESSION: No mammographic evidence of LEFT breast malignancy. RECOMMENDATION: Treatment plan for RIGHT breast cancer. I have discussed the findings and recommendations with the patient. Results were also provided in writing at the conclusion of the visit. If applicable, a reminder letter will be sent to the patient regarding the next appointment. BI-RADS CATEGORY  1: Negative. Electronically Signed   By: Margarette Canada M.D.   On: 05/27/2018 14:02   Mm Diag Breast Tomo Uni Right  Result Date: 05/14/2018 CLINICAL DATA:  Patient presents for palpable abnormality within the lower inner right breast as well as palpable fullness within the right axilla. EXAM: DIGITAL DIAGNOSTIC RIGHT MAMMOGRAM WITH CAD AND TOMO ULTRASOUND RIGHT BREAST COMPARISON:  Previous exam(s). ACR Breast Density Category c: The breast tissue is heterogeneously dense, which may obscure small masses. FINDINGS: Within the 6 o'clock position right breast  middle to posterior depth there is a new 9 mm group of coarse heterogeneous calcifications, further evaluated with magnification CC and true lateral views. Additionally within the upper-outer right breast there is a 1.7 cm group of round and punctate calcifications,  further evaluated with magnification views. Dense tissue is demonstrated underlying the palpable marker within the lower inner right breast. Oval circumscribed mass within the upper-outer right breast. Mammographic images were processed with CAD. On physical exam, there is a palpable mass within the lower inner right breast. Palpable lymph nodes are demonstrated within the right axilla. Targeted ultrasound is performed, showing there is a 5 mm simple cyst right breast 11 o'clock position 2 cm from nipple, corresponding with mammographically identified mass. At the site of palpable concern there are multiple hypoechoic masses as well as a large area of hypoechoic breast tissue. There is a 1.0 x 0.5 x 1.2 cm lobular hypoechoic mass right breast 7 o'clock position 3 cm from nipple, a 0.8 x 0.6 x 1.2 cm lobular hypoechoic mass right breast 7 o'clock position 2 cm from the nipple, and a 0.9 x 0.5 x 0.8 cm lobular hypoechoic mass right breast 5 o'clock position 3 cm from nipple. There is a 3.8 x 1.0 x 1.3 cm lobular hypoechoic mass right breast 5 o'clock position 2 cm from nipple. Multiple thickened right axillary lymph nodes (approximately 15). Measuring up to 1.0 cm in thickness. Adenopathy corresponds with axillary palpable abnormality. IMPRESSION: 1. Multiple lobular hypoechoic masses and large area of hypoechoic tissue within the inferior right breast at the site of palpable concern. 2. Multiple abnormal appearing cortically thickened right axillary lymph nodes. 3. Indeterminate coarse heterogeneous right breast calcifications. 4. Indeterminate round and punctate calcifications upper-outer quadrant right breast. RECOMMENDATION: 1. Ultrasound-guided core needle biopsy right breast mass 7 o'clock position 3 cm from the nipple. 2. Ultrasound-guided core needle biopsy right breast mass 5 o'clock position 3 cm from nipple. 3. Ultrasound-guided core needle biopsy cortically thickened right axillary lymph node. 4. Overall  findings are concerning for extensive breast malignancy and metastatic adenopathy. If pathology from these biopsy sites is consistent with malignancy, further evaluation with breast MRI is recommended for extent of disease. 5. If no MRI is obtained, recommend stereotactic guided core needle biopsy of the suspicious right breast calcifications 6 o'clock position, right breast calcifications upper-outer quadrant as well as ultrasound-guided core needle biopsy of the large area of irregular hypoechoic tissue right breast 5 o'clock position 2 cm from nipple. I have discussed the findings and recommendations with the patient. Results were also provided in writing at the conclusion of the visit. If applicable, a reminder letter will be sent to the patient regarding the next appointment. BI-RADS CATEGORY  5: Highly suggestive of malignancy. Electronically Signed   By: Lovey Newcomer M.D.   On: 05/14/2018 15:20   [place] Axillary Node Core Biopsy Right  Addendum Date: 05/23/2018   ADDENDUM REPORT: 05/23/2018 11:52 ADDENDUM: Pathology revealed GRADE II-III INVASIVE MAMMARY CARCINOMA, MAMMARY CARCINOMA IN SITU of the RIGHT breast, both locations, 5 o'clock, (ribbon clip) and 7 o'clock, (coil clip). This was found to be concordant by Dr. Fidela Salisbury. 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 This was found to be concordant by Dr. Fidela Salisbury. Pathology results were discussed with the patient and her husband by telephone, by Dr. Fidela Salisbury. The patient reported doing well after the biopsies with tenderness at the sites. Post biopsy instructions and care were reviewed and questions were answered.  The patient was encouraged to call [facility] for any additional concerns. The patient was referred to [facility] on May 28, 2018. Further evaluation with breast MRI is  recommended for extent of disease. If no MRI is obtained, recommend stereotactic guided core needle biopsy of the suspicious right breast calcifications 6 o'clock position, right breast calcifications upper-outer quadrant as well as ultrasound-guided core needle biopsy of the large area of irregular hypoechoic tissue right breast 5 o'clock position 2 cm from nipple. The patient is scheduled for a LEFT breast diagnostic mammogram on May 27, 2018. Pathology results reported by Terie Purser, RN on 05/23/2018. Electronically Signed   By: Fidela Salisbury M.D.   On: 05/23/2018 11:52   Result Date: 05/23/2018 CLINICAL DATA:  Suspicious right breast masses and enlarged right axillary lymph nodes. EXAM: ULTRASOUND GUIDED RIGHT BREAST CORE NEEDLE BIOPSY COMPARISON:  Previous exam(s). FINDINGS: I met with the patient and we discussed the procedure of ultrasound-guided biopsy, including benefits and alternatives. We discussed the high likelihood of a successful procedure. We discussed the risks of the procedure, including infection, bleeding, tissue injury, clip migration, and inadequate sampling. Informed written consent was given. The usual time-out protocol was performed immediately prior to the procedure. Using sterile technique and 1% Lidocaine as local anesthetic, under direct ultrasound visualization, a 14 gauge spring-loaded device was used to perform biopsy of right breast 5 o'clock mass 3 cm from the nipple using a medial approach. At the conclusion of the procedure a ribbon shaped tissue marker clip was deployed into the biopsy cavity. Lesion quadrant: Lower inner quadrant. Next, using sterile technique and 1% Lidocaine as local anesthetic, under direct ultrasound visualization, a 14 gauge spring-loaded device was used to perform biopsy of right breast 7 o'clock 3 cm from the nipple using a medial approach. At the conclusion of the procedure a coil shaped tissue marker clip was deployed into the biopsy  cavity. Lesion quadrant: Lower outer quadrant. Next, using sterile technique and 1% Lidocaine as local anesthetic, under direct ultrasound visualization, a 14 gauge spring-loaded device was used to perform biopsy of enlarged right axillary lymph node using a medial approach. At the conclusion of the procedure a HydroMARK tissue marker clip was deployed into the biopsy cavity. Follow up 2 view mammogram was performed and dictated separately. IMPRESSION: Ultrasound guided biopsy of right breast: 1.  5 o'clock mass, 2 ribbon shaped markers 2. Right breast 7 o'clock mass, coil shaped marker. 3.  Enlarged right axillary lymph node, HydroMARK. No apparent complications. Electronically Signed: By: Fidela Salisbury M.D. On: 05/21/2018 15:34   Mm Clip Placement Right  Result Date: 05/21/2018 CLINICAL DATA:  Post ultrasound-guided core needle biopsy of right breast 5 o'clock mass 3 cm from the nipple, 7 o'clock mass 3 cm from the nipple and abnormal right axillary lymph node. EXAM: DIAGNOSTIC RIGHT MAMMOGRAM POST ULTRASOUND BIOPSY COMPARISON:  Previous exam(s). FINDINGS: Mammographic images were obtained following ultrasound guided biopsy of right breast masses and an abnormal right axillary lymph node. Two-view mammography demonstrates presence of coil shaped marker in the lower slightly outer right breast, middle depth, in expected mammographic position. On the initial mammogram, the ribbon shaped marker was not seen despite multiple attempts in positioning, and therefore a second ribbon shaped marker was placed. On subsequent images, both ribbon shaped markers are present, immediately adjacent to each other in the lower inner quadrant, in expected mammographic position. The distance  between the 2 biopsy sites is 3.5 cm cranio caudally. [facility] placed within the abnormal right axillary lymph node, post ultrasound-guided biopsy is partially visualized due to its far superior location. IMPRESSION: Successful  placement of tissue markers post ultrasound-guided core needle biopsy of the right breast: 1. Two ribbon shaped markers at the 5 o'clock biopsy site. 2. Coil shaped marker at the 7 o'clock biopsy site. 3. HydroMARK within an abnormal right axillary lymph node. Final Assessment: Post Procedure Mammograms for Marker Placement Electronically Signed   By: Fidela Salisbury M.D.   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 This was found to be concordant by Dr. Fidela Salisbury. 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 This was found to be concordant by Dr. Fidela Salisbury. Pathology results were discussed with the patient and her husband by telephone, by Dr. Fidela Salisbury. The patient reported doing well after the biopsies with tenderness at the sites. Post biopsy instructions and care were reviewed and questions were answered. The patient was encouraged to call [facility] for any additional concerns. The patient was referred to [facility] on May 28, 2018. Further evaluation with breast MRI is recommended for extent of disease. If no MRI is obtained, recommend stereotactic guided core needle biopsy of the suspicious right breast calcifications 6 o'clock position, right breast calcifications upper-outer quadrant as well as ultrasound-guided core needle biopsy of the large area of irregular hypoechoic tissue right breast 5 o'clock position 2 cm from nipple. The patient is scheduled for a LEFT breast diagnostic mammogram on May 27, 2018. Pathology results reported by  Terie Purser, RN on 05/23/2018. Electronically Signed   By: Fidela Salisbury M.D.   On: 05/23/2018 11:52   Result Date: 05/23/2018 CLINICAL DATA:  Suspicious right breast masses and enlarged right axillary lymph nodes. EXAM: ULTRASOUND GUIDED RIGHT BREAST CORE NEEDLE BIOPSY COMPARISON:  Previous exam(s). FINDINGS: I met with the patient and we discussed the procedure of ultrasound-guided biopsy, including benefits and alternatives. We discussed the high likelihood of a successful procedure. We discussed the risks of the procedure, including infection, bleeding, tissue injury, clip migration, and inadequate sampling. Informed written consent was given. The usual time-out protocol was performed immediately prior to the procedure. Using sterile technique and 1% Lidocaine as local anesthetic, under direct ultrasound visualization, a 14 gauge spring-loaded device was used to perform biopsy of right breast 5 o'clock mass 3 cm from the nipple using a medial approach. At the conclusion of the procedure a ribbon shaped tissue marker clip was deployed into the biopsy cavity. Lesion quadrant: Lower inner quadrant. Next, using sterile technique and 1% Lidocaine as local anesthetic, under direct ultrasound visualization, a 14 gauge spring-loaded device was used to perform biopsy of right breast 7 o'clock 3 cm from the nipple using a medial approach. At the conclusion of the procedure a coil shaped tissue marker clip was deployed into the biopsy cavity. Lesion quadrant: Lower outer quadrant. Next, using sterile technique and 1% Lidocaine as local anesthetic, under direct ultrasound visualization, a 14 gauge spring-loaded device was used to perform biopsy of enlarged right axillary lymph  node using a medial approach. At the conclusion of the procedure a HydroMARK tissue marker clip was deployed into the biopsy cavity. Follow up 2 view mammogram was performed and dictated separately. IMPRESSION: Ultrasound guided biopsy of right  breast: 1.  5 o'clock mass, 2 ribbon shaped markers 2. Right breast 7 o'clock mass, coil shaped marker. 3.  Enlarged right axillary lymph node, HydroMARK. No apparent complications. Electronically Signed: By: Fidela Salisbury M.D. 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 This was found to be concordant by Dr. Fidela Salisbury. 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 This was found to be concordant by Dr. Fidela Salisbury. Pathology results were discussed with the patient and her husband by telephone, by Dr. Fidela Salisbury. The patient reported doing well after the biopsies with tenderness at the sites. Post biopsy instructions and care were reviewed and questions were answered. The patient was encouraged to call [facility] for any additional concerns. The patient was referred to [facility] on May 28, 2018. Further evaluation with breast MRI is recommended for extent of disease. If no MRI is obtained, recommend stereotactic guided core needle biopsy of the suspicious right breast calcifications 6 o'clock position, right breast calcifications upper-outer quadrant as well as ultrasound-guided core needle biopsy of the large area of irregular hypoechoic tissue right breast 5 o'clock position 2 cm from nipple. The patient is scheduled for a LEFT breast diagnostic mammogram on May 27, 2018. Pathology results reported by Terie Purser, RN on 05/23/2018. Electronically Signed   By: Fidela Salisbury M.D.   On: 05/23/2018 11:52   Result Date: 05/23/2018 CLINICAL DATA:   Suspicious right breast masses and enlarged right axillary lymph nodes. EXAM: ULTRASOUND GUIDED RIGHT BREAST CORE NEEDLE BIOPSY COMPARISON:  Previous exam(s). FINDINGS: I met with the patient and we discussed the procedure of ultrasound-guided biopsy, including benefits and alternatives. We discussed the high likelihood of a successful procedure. We discussed the risks of the procedure, including infection, bleeding, tissue injury, clip migration, and inadequate sampling. Informed written consent was given. The usual time-out protocol was performed immediately prior to the procedure. Using sterile technique and 1% Lidocaine as local anesthetic, under direct ultrasound visualization, a 14 gauge spring-loaded device was used to perform biopsy of right breast 5 o'clock mass 3 cm from the nipple using a medial approach. At the conclusion of the procedure a ribbon shaped tissue marker clip was deployed into the biopsy cavity. Lesion quadrant: Lower inner quadrant. Next, using sterile technique and 1% Lidocaine as local anesthetic, under direct ultrasound visualization, a 14 gauge spring-loaded device was used to perform biopsy of right breast 7 o'clock 3 cm from the nipple using a medial approach. At the conclusion of the procedure a coil shaped tissue marker clip was deployed into the biopsy cavity. Lesion quadrant: Lower outer quadrant. Next, using sterile technique and 1% Lidocaine as local anesthetic, under direct ultrasound visualization, a 14 gauge spring-loaded device was used to perform biopsy of enlarged right axillary lymph node using a medial approach. At the conclusion of the procedure a HydroMARK tissue marker clip was deployed into the  biopsy cavity. Follow up 2 view mammogram was performed and dictated separately. IMPRESSION: Ultrasound guided biopsy of right breast: 1.  5 o'clock mass, 2 ribbon shaped markers 2. Right breast 7 o'clock mass, coil shaped marker. 3.  Enlarged right axillary lymph node,  HydroMARK. No apparent complications. Electronically Signed: By: Fidela Salisbury M.D. On: 05/21/2018 15:34    ASSESSMENT & PLAN:  Joclynn Lumb is a 43 y.o. Caucasian female with a history of Acid reflux, Anemia, Endometriosis, and Bronchitis  1.  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  -She has a 3.8cm mass at 5:00 and 1.2cm mass at 7:00 position, plus 2 groups of calcification at 6-7 o'clock position which have not biopsied. [place] also showed 15 pabnormal lymph nodes.  -Biopsy showed ductal carcinoma, both ER strongly positive, PR weekly positive, similar morphology.  However there is discrepancy of her to test results between breast biopsy and axillary lymph node biopsy, I have requested additional HER-2 test on the breast biopsy from 7:00 mass, result is pending.    -Given her multiple positive LN, I will obtain staging PET or CT CAP with bone scan to rule out distant metastasis. Pt's husband had question if PET/MRI is a better staging scan  -Due to the multifocal disease, and extensive adenopathy, she would need right breast mastectomy and axillary lymph node dissection.  She will meet with Dr. Marlou Starks today to discuss this further.  -She is at high risk for cancer recurrence given her tumor size, 15 positive lymph nodes, and HER2 positive disease. With surgery alone her risk of recurrence is about 40%. Chemotherapy, Radiation and Anti-estrogen therapy are recommended to reduce her risk of recurrence  -I recommend the options of neoadjuvant and adjuvant chemotherapy. I recommend neoadjuvant, giving the benefit of down staging and ability to evaluate chemo response, and additional option of T-DM1 as adjuvant therapy if she has significant residual disease after neoadjuvant  chemo. I discussed different chemo regiment, and I recommend TCHP every 3 weeks for 6 cycles followed by Herceptin/perjeta maintenance therapy for one year, with the option of switching to T-DM1 (Kadcyla) after surgery.   --Chemotherapy consent: Side effects including but does not limited to, fatigue, nausea, vomiting, diarrhea, hair loss, neuropathy, fluid retention, renal and kidney dysfunction, neutropenic fever, needed for blood transfusion, bleeding, reversible cardiomyopathy, skin rash, were discussed with patient in great detail.  We particular discussed his risk of neuropathy, she is a violinist. She agrees to proceed with chemo. -The goal of therapy is curative, if her staging scan is negative for distant metastasis. -I offered her the chance to use a DigniCap during infusion to prevent alopecia. I gave her reading material on this, she will think about it.   -She was also seen by radiation oncologist Dr. Sondra Come today. Given positive lymph nodes she will proceed with adjuvant radiation after chemotherapy and surgery.  -Giving the strong ER and PR expression and her premenopausal status, I recommend adjuvant endocrine therapy with Tamoxifen for a total of 10 years to reduce the risk of cancer recurrence. Potential benefits and side effects were discussed with patient and she is interested. We also discussed ovarian suppression, she will think about it.  -I recommend she gets her IUD removed by her Gyn soon.  -Before she starts chemo she will proceed  with scans, Echo baseline, chemo education class and PAC placement. Will monitor Echo every 3 months during treatment  -I discussed resources at [facility] available to her such as Symptom Management Clinic, dietician, Social work, Chemical engineer, Social research officer, government.  -I discussed given her treatment plan she is eligible for our clinical trial Upbeat study. She is interested. Will contact Research Nurse for screening.  -[facility] -Labs reviewed, CBC and CMP are WNL. Will do Tumor Marker CA 27.29 on next visit.  -plan to start neoadjuvant chemo in 2 weeks     2. Genetic Testing -Based on her young age, she is eligible for genetic testing. She is interested.  -I will send Genetic referral.   3. Asthma  -mild, continue albuterol inhaler as needed    PLAN:  -I will send message to Dr. Pamala Hurry about IUD removal  -Send Genetic Referral  -Will contact Research Nurse for screening of the UPBEAT study  -Breast MRI  -I recommend staging PET or CT CAP/Bone Scan, pt would like me to get more info about PET/MRI for staging. I will discuss with radiology and try to contact [facility] Placement by Dr. Marlou Starks  -ECHO  -Chemo education class -f/u and first cycle chemo TCHP in 2 weeks   Orders Placed This Encounter  Procedures  . ECHOCARDIOGRAM COMPLETE    Standing Status:   Future    Standing Expiration Date:   08/26/2019    Order Specific Question:   Where should this test be performed    Answer:   [facility]    Order Specific Question:   Perflutren DEFINITY (image enhancing agent) should be administered unless hypersensitivity or allergy exist    Answer:   Administer Perflutren    Order Specific Question:   Reason for exam-Echo    Answer:   Chemo  V67.2 / Z09    Both pt and her husband had read a lot about breast cancer and had may questions. All questions were answered. The patient knows to call the clinic with any problems, questions or concerns. I spent 70 minutes counseling the patient face to face. The total time spent in the appointment was 90 minutes and more than 50% was on counseling.     [name], MD 05/28/2018 11:22 PM  I, Joslyn Devon, am acting as scribe for [name], MD.   I have reviewed the above documentation for accuracy and completeness, and I agree with the above.  Addendum  I spoke with pathologist Dr. Melina Copa, she recommends to repeat HER2 on pt's surgical sample, but not on the  breast biopsy from 7:00 position, due to the small sample. I agreed.   [name]  05/30/2018

## 2018-05-28 NOTE — Progress Notes (Signed)
[place] Psychosocial Distress Screening Spiritual Care  Met with [facility] in [facility] to introduce [facility] team/resources, reviewing distress screen per protocol.  The patient scored a 7 on the Psychosocial Distress Thermometer which indicates severe distress. Also assessed for distress and other psychosocial needs.   ONCBCN DISTRESS SCREENING 05/28/2018  Screening Type Initial Screening  Distress experienced in past week (1-10) 7  Emotional problem type Adjusting to illness  Referral to support programs Yes   The pt presented to Breast Clinic with her husband. She expressed that she feels "numb" when asked whether her level of distress has changed. Similarly, the pt's husband shared, "I feel spent." However, both the pt and her husband identified supportive people and coping skills in their lives, indicating that they are leaning on these as resources during this time. The pt expressed that her primary concern is sharing information about her diagnosis and treatment with her children. The pt and counselor processed concerns around this conversation. The pt expressed no current needs for support programs at this time, but she reported that she will reach out if a need arises.  Follow up needed: No.   Doris Cheadle, Counseling Intern [phone]

## 2018-05-28 NOTE — Patient Instructions (Signed)

## 2018-05-28 NOTE — Progress Notes (Signed)
Radiation Oncology         ([phone]) [phone] ________________________________  Multidisciplinary Breast Oncology Clinic [facility]) Initial Outpatient Consultation  Name: Kim [name] MRN: [id number]  Date: 05/28/2018  DOB: [birth year]  QV:[id number], Kim Cookey, MD  Kim Small, MD   REFERRING PHYSICIAN: [name], MD  DIAGNOSIS: The encounter diagnosis was Malignant neoplasm of lower-inner quadrant of right breast of female, estrogen receptor positive ([place]). Locally advanced invasive right breast cancer, grade II-III     Malignant neoplasm of lower-inner quadrant of right breast of female, estrogen receptor positive ([place])   05/14/2018 Mammogram    Right breast mammogram 05/14/18  IMPRESSION  There is a 1.0 x 0.5 x 1.2 cm lobular hypoechoic mass right breast 7 o'clock position 3 cm from nipple, a 0.8 x 0.6 x 1.2 cm lobular hypoechoic mass right breast 7 o'clock position 2 cm from the nipple, and a 0.9 x 0.5 x 0.8 cm lobular hypoechoic mass right breast 5 o'clock position 3 cm from nipple.  There is a 3.8 x 1.0 x 1.3 cm lobular hypoechoic mass right breast 5 o'clock position 2 cm from nipple.  Multiple thickened right axillary lymph nodes (approximately 15). Measuring up to 1.0 cm in thickness. Adenopathy corresponds with axillary palpable abnormality.  Indeterminate round and punctate calcifications upper-outer quadrant right breast.Indeterminate round and punctate calcifications upper-outer quadrant right breast.    05/21/2018 Initial Biopsy    Diagnosis 05/21/18  1. Breast, right, needle core biopsy, axilla, 5 o'clock, ribbon clip - INVASIVE MAMMARY CARCINOMA. - MAMMARY CARCINOMA IN SITU. 2. Breast, right, needle core biopsy, 7 o'clock, coil clip - INVASIVE MAMMARY CARCINOMA. - MAMMARY CARCINOMA IN SITU. 3. Lymph node, needle/core biopsy, right axillary LN, hydromark - METASTATIC MAMMARY CARCINOMA.     05/21/2018 Receptors her2    Results: GROUP 5: HER2 **NEGATIVE**, FISH negative    HER2 Positive Lymph nodes  Estrogen Receptor: 80%, POSITIVE, MODERATE STAINING INTENSITY Progesterone Receptor: 5%, POSITIVE, MODERATE-WEAK STAINING INTENSITY Proliferation Marker Ki67: 10%    05/26/2018 Initial Diagnosis    Malignant neoplasm of lower-inner quadrant of right breast of female, estrogen receptor positive ([place])    05/27/2018 Mammogram    Left breast mammogram 05/27/18  IMPRESSION: No mammographic evidence of LEFT breast malignancy.        ICD-10-CM   1. Malignant neoplasm of lower-inner quadrant of right breast of female, estrogen receptor positive ([place]) C50.311    Z17.0     HISTORY OF PRESENT ILLNESS::Kim [name] is a 43 y.o. female who is presenting to the office today for evaluation of her newly diagnosed breast cancer. She is accompanied by her husband.      Pt palpated an abnormality in the lower inner right breast as well as palpable fullness within the right axilla.  She underwent bilateral diagnostic mammography with tomography and right breast ultrasonography at [facility] on January 29 showing: multiple lobular hypoechoic masses and large area of hypoechoic tissue within the inferior right breast at the site of palpable concern. At the 5 o'clock position, a mass measures 3.8 x 1.0 x 1.3 cm. Multiple abnormal appearing cortically thickened right axillary lymph nodes were present (approximately 15), measuring up to 1 cm in thickness. Intermediate coarse heterogenous right breast calcifications. Indeterminate round and punctate calcifications upper-outer quadrant right breast. Additionally, on 2/11 she underwent left diagnostic mammography with tomography which did not show evidence of malignancy.   Biopsy on February 5 showed: invasive mammary carcinoma and mammary carcinoma in situ at the right axilla 5 o'clock.  Prognostic indicators significant for: estrogen receptor, 80% positive and progesterone receptor, 5% positive, both with strong staining.  Proliferation marker Ki67 at 10%. HER2 was found to be equivocal, and after a FISH was performed the site was deemed negative. In the right breast at 7 o'clock, invasive mammary carcinoma and mammary carcinoma in situ were found. Prognostics were not run on this sample at the time. In a right axillary lymph node, metastatic mammary carcinoma was discovered. Prognostic indicators at this site significant for: ER, 90% positive with strong staining and PR, 50% positive with moderate staining. Proliferation marker Ki67 at 5%. HER2 positive.   Menarche: 43 years old Age at first live birth: 43 years old GP: GxP3 LMP: Mirena IUD since 2016 Contraceptive: oral contraception for 5-7 years HRT: unknown   The patient was referred today for presentation in the multidisciplinary conference.  Radiology studies and pathology slides were presented there for review and discussion of treatment options.  A consensus was discussed regarding potential next steps.  PREVIOUS RADIATION THERAPY: No  PAST MEDICAL HISTORY:  has a past medical history of Acid reflux, Acute bronchitis, Anemia, Asthma, Endometritis, and Migraine.    PAST SURGICAL HISTORY: Past Surgical History:  Procedure Laterality Date  . BREAST BIOPSY Right 05/23/2018   x3, malignant  . DILATION AND CURETTAGE OF UTERUS     2006 x1, 2009 x2  . LAPAROTOMY  2008    FAMILY HISTORY: family history includes Cancer in her maternal aunt and another family member; Cancer (age of onset: 37) in her sister; Hyperlipidemia in her father; Prostate cancer in her maternal grandfather.  SOCIAL HISTORY:  reports that she has never smoked. She has never used smokeless tobacco. She reports current alcohol use. She reports that she does not use drugs.  ALLERGIES: Patient has no known allergies.  MEDICATIONS:  Current Outpatient Medications  Medication Sig Dispense Refill  . albuterol (PROVENTIL HFA;VENTOLIN HFA) 108 (90 BASE) MCG/ACT inhaler Inhale 2 puffs into  the lungs every 6 (six) hours as needed for wheezing or shortness of breath.    . ALPRAZolam (XANAX) 0.5 MG tablet Take 0.5 mg by mouth daily as needed for anxiety. 3 to 5 times month    . calcium carbonate (TUMS - DOSED IN MG ELEMENTAL CALCIUM) 500 MG chewable tablet Chew 1 tablet by mouth daily as needed. heartburn    . cetirizine (ZYRTEC) 10 MG tablet Take 10 mg by mouth daily as needed for allergies.    . fish oil-omega-3 fatty acids 1000 MG capsule Take 1 g by mouth daily.     Marland Kitchen levonorgestrel (MIRENA) 20 MCG/24HR IUD by Intrauterine route.    . Probiotic Product (PROBIOTIC DAILY PO) Take 1 capsule by mouth daily.     . propranolol (INDERAL) 10 MG tablet Take 5 mg by mouth daily as needed. Takes as needed for shaking hand less than 10 times per year     No current facility-administered medications for this encounter.     REVIEW OF SYSTEMS:  REVIEW OF SYSTEMS: A 10+ POINT REVIEW OF SYSTEMS WAS OBTAINED including neurology, dermatology, psychiatry, cardiac, respiratory, lymph, extremities, GI, GU, musculoskeletal, constitutional, reproductive, HEENT.On the provided form, she reports rhinorrhea, SOB with walking and stairs (relieved with albuterol/Zyrtec), right breast lump, and swollen right neck. She denies any other symptoms.    PHYSICAL EXAM:  Vitals with BMI 05/28/2018  Height '5\' 9"'   Weight 136 lbs 11 oz  BMI 29.52  Systolic 96  Diastolic 73  Pulse 57  Respirations  18   Lungs are clear to auscultation bilaterally. Heart has regular rate and rhythm. No palpable cervical, supraclavicular, or axillary adenopathy. Abdomen soft, non-tender, normal bowel sounds. Breast: Left breast with no palpable mass, nipple discharge, or bleeding. Right breast with significant bruising in the lower inner aspect and what appears to be a palpable mass measuring 3x3.5 cm. Also appears to be an approximately 2x1 cm palpable mass in the lower outer quadrant. Pt appears to have several [name] hard lymph nodes  in the right axillary region, with the largest measuring approximately 1.5x1.0 cm.    ECOG = 0  0 - Asymptomatic (Fully active, able to carry on all predisease activities without restriction)  1 - Symptomatic but completely ambulatory (Restricted in physically strenuous activity but ambulatory and able to carry out work of a light or sedentary nature. For example, light housework, office work)  2 - Symptomatic, <50% in bed during the day (Ambulatory and capable of all self care but unable to carry out any work activities. Up and about more than 50% of waking hours)  3 - Symptomatic, >50% in bed, but not bedbound (Capable of only limited self-care, confined to bed or chair 50% or more of waking hours)  4 - Bedbound (Completely disabled. Cannot carry on any self-care. Totally confined to bed or chair)  5 - Death   Eustace Pen MM, Creech RH, Tormey DC, et al. [phone]). "Toxicity and response criteria of the [facility]". [place] Oncol. 5 (6): 649-55  LABORATORY DATA:  Lab Results  Component Value Date   WBC 4.1 05/28/2018   HGB 13.5 05/28/2018   HCT 40.8 05/28/2018   MCV 93.8 05/28/2018   PLT 203 05/28/2018   Lab Results  Component Value Date   NA 141 05/28/2018   K 4.2 05/28/2018   CL 105 05/28/2018   CO2 28 05/28/2018   Lab Results  Component Value Date   ALT 10 05/28/2018   AST 8 (L) 05/28/2018   ALKPHOS 57 05/28/2018   BILITOT 0.7 05/28/2018    PULMONARY FUNCTION TEST:   Recent Review Flowsheet Data    There is no flowsheet data to display.      RADIOGRAPHY: US Breast Ltd Uni Right Inc Axilla  Result Date: 05/14/2018 CLINICAL DATA:  Patient presents for palpable abnormality within the lower inner right breast as well as palpable fullness within the right axilla. EXAM: DIGITAL DIAGNOSTIC RIGHT MAMMOGRAM WITH CAD AND TOMO ULTRASOUND RIGHT BREAST COMPARISON:  Previous exam(s). ACR Breast Density Category c: The breast tissue is heterogeneously dense,  which may obscure [name] masses. FINDINGS: Within the 6 o'clock position right breast middle to posterior depth there is a new 9 mm group of coarse heterogeneous calcifications, further evaluated with magnification CC and true lateral views. Additionally within the upper-outer right breast there is a 1.7 cm group of round and punctate calcifications, further evaluated with magnification views. Dense tissue is demonstrated underlying the palpable marker within the lower inner right breast. Oval circumscribed mass within the upper-outer right breast. Mammographic images were processed with CAD. On physical exam, there is a palpable mass within the lower inner right breast. Palpable lymph nodes are demonstrated within the right axilla. Targeted ultrasound is performed, showing there is a 5 mm simple cyst right breast 11 o'clock position 2 cm from nipple, corresponding with mammographically identified mass. At the site of palpable concern there are multiple hypoechoic masses as well as a large area of hypoechoic breast tissue. There is  a 1.0 x 0.5 x 1.2 cm lobular hypoechoic mass right breast 7 o'clock position 3 cm from nipple, a 0.8 x 0.6 x 1.2 cm lobular hypoechoic mass right breast 7 o'clock position 2 cm from the nipple, and a 0.9 x 0.5 x 0.8 cm lobular hypoechoic mass right breast 5 o'clock position 3 cm from nipple. There is a 3.8 x 1.0 x 1.3 cm lobular hypoechoic mass right breast 5 o'clock position 2 cm from nipple. Multiple thickened right axillary lymph nodes (approximately 15). Measuring up to 1.0 cm in thickness. Adenopathy corresponds with axillary palpable abnormality. IMPRESSION: 1. Multiple lobular hypoechoic masses and large area of hypoechoic tissue within the inferior right breast at the site of palpable concern. 2. Multiple abnormal appearing cortically thickened right axillary lymph nodes. 3. Indeterminate coarse heterogeneous right breast calcifications. 4. Indeterminate round and punctate  calcifications upper-outer quadrant right breast. RECOMMENDATION: 1. Ultrasound-guided core needle biopsy right breast mass 7 o'clock position 3 cm from the nipple. 2. Ultrasound-guided core needle biopsy right breast mass 5 o'clock position 3 cm from nipple. 3. Ultrasound-guided core needle biopsy cortically thickened right axillary lymph node. 4. Overall findings are concerning for extensive breast malignancy and metastatic adenopathy. If pathology from these biopsy sites is consistent with malignancy, further evaluation with breast MRI is recommended for extent of disease. 5. If no MRI is obtained, recommend stereotactic guided core needle biopsy of the suspicious right breast calcifications 6 o'clock position, right breast calcifications upper-outer quadrant as well as ultrasound-guided core needle biopsy of the large area of irregular hypoechoic tissue right breast 5 o'clock position 2 cm from nipple. I have discussed the findings and recommendations with the patient. Results were also provided in writing at the conclusion of the visit. If applicable, a reminder letter will be sent to the patient regarding the next appointment. BI-RADS CATEGORY  5: Highly suggestive of malignancy. Electronically Signed   By: Lovey Newcomer M.D.   On: 05/14/2018 15:20   Mm Diag Breast Tomo Uni Left  Result Date: 05/27/2018 CLINICAL DATA:  43 year old female for evaluation of LEFT breast. Recent diagnosis of RIGHT breast cancer with metastasis to RIGHT axillary lymph nodes. EXAM: DIGITAL DIAGNOSTIC UNILATERAL LEFT MAMMOGRAM WITH CAD AND TOMO COMPARISON:  Previous exam(s). ACR Breast Density Category c: The breast tissue is heterogeneously dense, which may obscure [name] masses. FINDINGS: 2D/3D full field and spot compression views of the LEFT breast demonstrate no suspicious mass, distortion or worrisome calcifications. Mammographic images were processed with CAD. IMPRESSION: No mammographic evidence of LEFT breast malignancy.  RECOMMENDATION: Treatment plan for RIGHT breast cancer. I have discussed the findings and recommendations with the patient. Results were also provided in writing at the conclusion of the visit. If applicable, a reminder letter will be sent to the patient regarding the next appointment. BI-RADS CATEGORY  1: Negative. Electronically Signed   By: Margarette Canada M.D.   On: 05/27/2018 14:02   Mm Diag Breast Tomo Uni Right  Result Date: 05/14/2018 CLINICAL DATA:  Patient presents for palpable abnormality within the lower inner right breast as well as palpable fullness within the right axilla. EXAM: DIGITAL DIAGNOSTIC RIGHT MAMMOGRAM WITH CAD AND TOMO ULTRASOUND RIGHT BREAST COMPARISON:  Previous exam(s). ACR Breast Density Category c: The breast tissue is heterogeneously dense, which may obscure [name] masses. FINDINGS: Within the 6 o'clock position right breast middle to posterior depth there is a new 9 mm group of coarse heterogeneous calcifications, further evaluated with magnification CC and true lateral views. Additionally  within the upper-outer right breast there is a 1.7 cm group of round and punctate calcifications, further evaluated with magnification views. Dense tissue is demonstrated underlying the palpable marker within the lower inner right breast. Oval circumscribed mass within the upper-outer right breast. Mammographic images were processed with CAD. On physical exam, there is a palpable mass within the lower inner right breast. Palpable lymph nodes are demonstrated within the right axilla. Targeted ultrasound is performed, showing there is a 5 mm simple cyst right breast 11 o'clock position 2 cm from nipple, corresponding with mammographically identified mass. At the site of palpable concern there are multiple hypoechoic masses as well as a large area of hypoechoic breast tissue. There is a 1.0 x 0.5 x 1.2 cm lobular hypoechoic mass right breast 7 o'clock position 3 cm from nipple, a 0.8 x 0.6 x 1.2 cm  lobular hypoechoic mass right breast 7 o'clock position 2 cm from the nipple, and a 0.9 x 0.5 x 0.8 cm lobular hypoechoic mass right breast 5 o'clock position 3 cm from nipple. There is a 3.8 x 1.0 x 1.3 cm lobular hypoechoic mass right breast 5 o'clock position 2 cm from nipple. Multiple thickened right axillary lymph nodes (approximately 15). Measuring up to 1.0 cm in thickness. Adenopathy corresponds with axillary palpable abnormality. IMPRESSION: 1. Multiple lobular hypoechoic masses and large area of hypoechoic tissue within the inferior right breast at the site of palpable concern. 2. Multiple abnormal appearing cortically thickened right axillary lymph nodes. 3. Indeterminate coarse heterogeneous right breast calcifications. 4. Indeterminate round and punctate calcifications upper-outer quadrant right breast. RECOMMENDATION: 1. Ultrasound-guided core needle biopsy right breast mass 7 o'clock position 3 cm from the nipple. 2. Ultrasound-guided core needle biopsy right breast mass 5 o'clock position 3 cm from nipple. 3. Ultrasound-guided core needle biopsy cortically thickened right axillary lymph node. 4. Overall findings are concerning for extensive breast malignancy and metastatic adenopathy. If pathology from these biopsy sites is consistent with malignancy, further evaluation with breast MRI is recommended for extent of disease. 5. If no MRI is obtained, recommend stereotactic guided core needle biopsy of the suspicious right breast calcifications 6 o'clock position, right breast calcifications upper-outer quadrant as well as ultrasound-guided core needle biopsy of the large area of irregular hypoechoic tissue right breast 5 o'clock position 2 cm from nipple. I have discussed the findings and recommendations with the patient. Results were also provided in writing at the conclusion of the visit. If applicable, a reminder letter will be sent to the patient regarding the next appointment. BI-RADS CATEGORY  5:  Highly suggestive of malignancy. Electronically Signed   By: Lovey Newcomer M.D.   On: 05/14/2018 15:20   [place] Axillary Node Core Biopsy Right  Addendum Date: 05/23/2018   ADDENDUM REPORT: 05/23/2018 11:52 ADDENDUM: Pathology revealed GRADE II-III INVASIVE MAMMARY CARCINOMA, MAMMARY CARCINOMA IN SITU of the RIGHT breast, both locations, 5 o'clock, (ribbon clip) and 7 o'clock, (coil clip). This was found to be concordant by Dr. Fidela Salisbury. 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 This was found to be concordant by Dr. Fidela Salisbury. Pathology results were discussed with the patient and her husband by telephone, by Dr. Fidela Salisbury. The patient reported doing well after the biopsies with tenderness at the sites. Post biopsy instructions and care were reviewed and questions were answered. The patient was encouraged to call [facility] for any additional concerns. The patient was referred to [facility] [place]  [facility] at [facility] on May 28, 2018. Further evaluation with breast MRI is recommended for extent of disease. If no MRI is obtained, recommend stereotactic guided core needle biopsy of the suspicious right breast calcifications 6 o'clock position, right breast calcifications upper-outer quadrant as well as ultrasound-guided core needle biopsy of the large area of irregular hypoechoic tissue right breast 5 o'clock position 2 cm from nipple. The patient is scheduled for a LEFT breast diagnostic mammogram on May 27, 2018. Pathology results reported by Terie Purser, RN on 05/23/2018. Electronically Signed   By: Fidela Salisbury M.D.   On: 05/23/2018 11:52   Result Date: 05/23/2018 CLINICAL DATA:  Suspicious right breast masses and enlarged right axillary lymph nodes. EXAM: ULTRASOUND GUIDED RIGHT BREAST CORE NEEDLE BIOPSY COMPARISON:  Previous exam(s). FINDINGS: I met with  the patient and we discussed the procedure of ultrasound-guided biopsy, including benefits and alternatives. We discussed the high likelihood of a successful procedure. We discussed the risks of the procedure, including infection, bleeding, tissue injury, clip migration, and inadequate sampling. Informed written consent was given. The usual time-out protocol was performed immediately prior to the procedure. Using sterile technique and 1% Lidocaine as local anesthetic, under direct ultrasound visualization, a 14 gauge spring-loaded device was used to perform biopsy of right breast 5 o'clock mass 3 cm from the nipple using a medial approach. At the conclusion of the procedure a ribbon shaped tissue marker clip was deployed into the biopsy cavity. Lesion quadrant: Lower inner quadrant. Next, using sterile technique and 1% Lidocaine as local anesthetic, under direct ultrasound visualization, a 14 gauge spring-loaded device was used to perform biopsy of right breast 7 o'clock 3 cm from the nipple using a medial approach. At the conclusion of the procedure a coil shaped tissue marker clip was deployed into the biopsy cavity. Lesion quadrant: Lower outer quadrant. Next, using sterile technique and 1% Lidocaine as local anesthetic, under direct ultrasound visualization, a 14 gauge spring-loaded device was used to perform biopsy of enlarged right axillary lymph node using a medial approach. At the conclusion of the procedure a HydroMARK tissue marker clip was deployed into the biopsy cavity. Follow up 2 view mammogram was performed and dictated separately. IMPRESSION: Ultrasound guided biopsy of right breast: 1.  5 o'clock mass, 2 ribbon shaped markers 2. Right breast 7 o'clock mass, coil shaped marker. 3.  Enlarged right axillary lymph node, HydroMARK. No apparent complications. Electronically Signed: By: Fidela Salisbury M.D. On: 05/21/2018 15:34   Mm Clip Placement Right  Result Date: 05/21/2018 CLINICAL DATA:  Post  ultrasound-guided core needle biopsy of right breast 5 o'clock mass 3 cm from the nipple, 7 o'clock mass 3 cm from the nipple and abnormal right axillary lymph node. EXAM: DIAGNOSTIC RIGHT MAMMOGRAM POST ULTRASOUND BIOPSY COMPARISON:  Previous exam(s). FINDINGS: Mammographic images were obtained following ultrasound guided biopsy of right breast masses and an abnormal right axillary lymph node. Two-view mammography demonstrates presence of coil shaped marker in the lower slightly outer right breast, middle depth, in expected mammographic position. On the initial mammogram, the ribbon shaped marker was not seen despite multiple attempts in positioning, and therefore a second ribbon shaped marker was placed. On subsequent images, both ribbon shaped markers are present, immediately adjacent to each other in the lower inner quadrant, in expected mammographic position. The distance between the 2 biopsy sites is 3.5 cm cranio caudally. [facility] placed within the abnormal right axillary lymph node, post ultrasound-guided biopsy is partially  visualized due to its far superior location. IMPRESSION: Successful placement of tissue markers post ultrasound-guided core needle biopsy of the right breast: 1. Two ribbon shaped markers at the 5 o'clock biopsy site. 2. Coil shaped marker at the 7 o'clock biopsy site. 3. HydroMARK within an abnormal right axillary lymph node. Final Assessment: Post Procedure Mammograms for Marker Placement Electronically Signed   By: Fidela Salisbury M.D.   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 This was found to be concordant by Dr. Fidela Salisbury. Pathology revealed METASTATIC MAMMARY CARCINOMA of  the RIGHT axillary lymph nodes. This was found to be concordant by Dr. Fidela Salisbury. Pathology results were discussed with the patient and her husband by telephone, by Dr. Fidela Salisbury. The patient reported doing well after the biopsies with tenderness at the sites. Post biopsy instructions and care were reviewed and questions were answered. The patient was encouraged to call [facility] for any additional concerns. The patient was referred to [facility] [place] [facility] on May 28, 2018. Further evaluation with breast MRI is recommended for extent of disease. If no MRI is obtained, recommend stereotactic guided core needle biopsy of the suspicious right breast calcifications 6 o'clock position, right breast calcifications upper-outer quadrant as well as ultrasound-guided core needle biopsy of the large area of irregular hypoechoic tissue right breast 5 o'clock position 2 cm from nipple. The patient is scheduled for a LEFT breast diagnostic mammogram on May 27, 2018. Pathology results reported by Terie Purser, RN on 05/23/2018. Electronically Signed   By: Fidela Salisbury M.D.   On: 05/23/2018 11:52   Result Date: 05/23/2018 CLINICAL DATA:  Suspicious right breast masses and enlarged right axillary lymph nodes. EXAM: ULTRASOUND GUIDED RIGHT BREAST CORE NEEDLE BIOPSY COMPARISON:  Previous exam(s). FINDINGS: I met with the patient and we discussed the procedure of ultrasound-guided biopsy, including benefits and alternatives. We discussed the high likelihood of a successful procedure. We discussed the risks of the procedure, including infection, bleeding, tissue injury, clip migration, and inadequate sampling. Informed written consent was given. The usual time-out protocol was performed immediately prior to the procedure. Using sterile technique and 1% Lidocaine as local anesthetic, under direct ultrasound  visualization, a 14 gauge spring-loaded device was used to perform biopsy of right breast 5 o'clock mass 3 cm from the nipple using a medial approach. At the conclusion of the procedure a ribbon shaped tissue marker clip was deployed into the biopsy cavity. Lesion quadrant: Lower inner quadrant. Next, using sterile technique and 1% Lidocaine as local anesthetic, under direct ultrasound visualization, a 14 gauge spring-loaded device was used to perform biopsy of right breast 7 o'clock 3 cm from the nipple using a medial approach. At the conclusion of the procedure a coil shaped tissue marker clip was deployed into the biopsy cavity. Lesion quadrant: Lower outer quadrant. Next, using sterile technique and 1% Lidocaine as local anesthetic, under direct ultrasound visualization, a 14 gauge spring-loaded device was used to perform biopsy of enlarged right axillary lymph node using a medial approach. At the conclusion of the procedure a HydroMARK tissue marker clip was deployed into the biopsy cavity. Follow up 2  view mammogram was performed and dictated separately. IMPRESSION: Ultrasound guided biopsy of right breast: 1.  5 o'clock mass, 2 ribbon shaped markers 2. Right breast 7 o'clock mass, coil shaped marker. 3.  Enlarged right axillary lymph node, HydroMARK. No apparent complications. Electronically Signed: By: Fidela Salisbury M.D. 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 This was found to be concordant by Dr. Fidela Salisbury. 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 This was found to be concordant by Dr. Fidela Salisbury. Pathology results were discussed with the patient and  her husband by telephone, by Dr. Fidela Salisbury. The patient reported doing well after the biopsies with tenderness at the sites. Post biopsy instructions and care were reviewed and questions were answered. The patient was encouraged to call [facility] for any additional concerns. The patient was referred to [facility] at [facility] on May 28, 2018. Further evaluation with breast MRI is recommended for extent of disease. If no MRI is obtained, recommend stereotactic guided core needle biopsy of the suspicious right breast calcifications 6 o'clock position, right breast calcifications upper-outer quadrant as well as ultrasound-guided core needle biopsy of the large area of irregular hypoechoic tissue right breast 5 o'clock position 2 cm from nipple. The patient is scheduled for a LEFT breast diagnostic mammogram on May 27, 2018. Pathology results reported by Terie Purser, RN on 05/23/2018. Electronically Signed   By: Fidela Salisbury M.D.   On: 05/23/2018 11:52   Result Date: 05/23/2018 CLINICAL DATA:  Suspicious right breast masses and enlarged right axillary lymph nodes. EXAM: ULTRASOUND GUIDED RIGHT BREAST CORE NEEDLE BIOPSY COMPARISON:  Previous exam(s). FINDINGS: I met with the patient and we discussed the procedure of ultrasound-guided biopsy, including benefits and alternatives. We discussed the high likelihood of a successful procedure. We discussed the risks of the procedure, including infection, bleeding, tissue injury, clip migration, and inadequate sampling. Informed written consent was given. The usual time-out protocol was performed immediately prior to the procedure. Using sterile technique and 1% Lidocaine as local anesthetic, under direct ultrasound visualization, a 14 gauge spring-loaded device was used to perform biopsy of right breast 5 o'clock mass 3 cm from the nipple using a medial  approach. At the conclusion of the procedure a ribbon shaped tissue marker clip was deployed into the biopsy cavity. Lesion quadrant: Lower inner quadrant. Next, using sterile technique and 1% Lidocaine as local anesthetic, under direct ultrasound visualization, a 14 gauge spring-loaded device was used to perform biopsy of right breast 7 o'clock 3 cm from the nipple using a medial approach. At the conclusion of the procedure a coil shaped tissue marker clip was deployed into the biopsy cavity. Lesion quadrant: Lower outer quadrant. Next, using sterile technique and 1% Lidocaine as local anesthetic, under direct ultrasound visualization, a 14 gauge spring-loaded device was used to perform biopsy of enlarged right axillary lymph node using a medial approach. At the conclusion of the procedure a HydroMARK tissue marker clip was deployed into the biopsy cavity. Follow up 2 view mammogram was performed and dictated separately. IMPRESSION: Ultrasound guided biopsy of right breast: 1.  5 o'clock mass, 2  ribbon shaped markers 2. Right breast 7 o'clock mass, coil shaped marker. 3.  Enlarged right axillary lymph node, HydroMARK. No apparent complications. Electronically Signed: By: Fidela Salisbury M.D. On: 05/21/2018 15:34      IMPRESSION: Locally advanced invasive right breast cancer, grade II-III  Patient appears to have locally advanced, multicentric right breast cancer, with extensive axillary involvement (15 lymph nodes). Given these findings, the patient would not be a candidate for breast conserving surgery. She would likely benefit from post-mastectomy radiation therapy for local-regional control issues.   Today, I talked to the patient and her husband about the findings and work-up thus far.  We discussed the natural history of her disease and general treatment, highlighting the role of radiotherapy in the management.  We discussed the available radiation techniques, and focused on the details of logistics  and delivery.  We reviewed the anticipated acute and late sequelae associated with radiation in this setting.  The patient was encouraged to ask questions that I answered to the best of my ability.   PLAN:  1. Outside consultation at [facility] with Dr. Janan Halter 2. Neo/Port 3. MRI 4. CT/Bone scan 5. Genetics testing 6. Right modified radical mastectomy 7. Echocardiogram 8. XRT 9. Chemo class 10. Anti-estrogen   ------------------------------------------------  Blair Promise, PhD, MD This document serves as a record of services personally performed by [name], MD. It was created on his behalf by Mary-Margaret Loma Messing, a trained medical scribe. The creation of this record is based on the scribe's personal observations and the provider's statements to them. This document has been checked and approved by the attending provider.

## 2018-05-29 ENCOUNTER — Telehealth: Payer: Self-pay | Admitting: Hematology

## 2018-05-29 ENCOUNTER — Other Ambulatory Visit: Payer: Self-pay | Admitting: *Deleted

## 2018-05-29 DIAGNOSIS — C50511 Malignant neoplasm of lower-outer quadrant of right female breast: Secondary | ICD-10-CM

## 2018-05-29 DIAGNOSIS — C50311 Malignant neoplasm of lower-inner quadrant of right female breast: Secondary | ICD-10-CM

## 2018-05-29 DIAGNOSIS — Z170 Estrogen receptor positive status [ER+]: Principal | ICD-10-CM

## 2018-05-29 NOTE — Telephone Encounter (Signed)
No los per 02/12.

## 2018-05-29 NOTE — Progress Notes (Signed)
error 

## 2018-05-30 ENCOUNTER — Telehealth: Payer: Self-pay | Admitting: Hematology

## 2018-05-30 ENCOUNTER — Encounter (HOSPITAL_BASED_OUTPATIENT_CLINIC_OR_DEPARTMENT_OTHER): Payer: Self-pay | Admitting: *Deleted

## 2018-05-30 ENCOUNTER — Other Ambulatory Visit: Payer: Self-pay | Admitting: Hematology

## 2018-05-30 ENCOUNTER — Other Ambulatory Visit: Payer: Self-pay

## 2018-05-30 ENCOUNTER — Inpatient Hospital Stay: Payer: BC Managed Care – PPO

## 2018-05-30 ENCOUNTER — Ambulatory Visit (HOSPITAL_COMMUNITY)
Admission: RE | Admit: 2018-05-30 | Discharge: 2018-05-30 | Disposition: A | Discharge status: Home / Self Care | Payer: BC Managed Care – PPO | Source: Ambulatory Visit | Attending: Hematology | Admitting: Hematology

## 2018-05-30 DIAGNOSIS — C50311 Malignant neoplasm of lower-inner quadrant of right female breast: Secondary | ICD-10-CM

## 2018-05-30 DIAGNOSIS — Z170 Estrogen receptor positive status [ER+]: Principal | ICD-10-CM

## 2018-05-30 DIAGNOSIS — C773 Secondary and unspecified malignant neoplasm of axilla and upper limb lymph nodes: Secondary | ICD-10-CM

## 2018-05-30 DIAGNOSIS — C50911 Malignant neoplasm of unspecified site of right female breast: Secondary | ICD-10-CM | POA: Diagnosis not present

## 2018-05-30 MED ORDER — PROCHLORPERAZINE MALEATE 10 MG PO TABS: 10.0000 mg | ORAL_TABLET | Freq: Four times a day (QID) | ORAL | 0 refills | Status: DC | PRN

## 2018-05-30 MED ORDER — ONDANSETRON HCL 8 MG PO TABS: 8.0000 mg | ORAL_TABLET | Freq: Three times a day (TID) | ORAL | 0 refills | Status: DC | PRN

## 2018-05-30 MED ORDER — LIDOCAINE-PRILOCAINE 2.5-2.5 % EX CREA: 1.0000 "application " | TOPICAL_CREAM | CUTANEOUS | 0 refills | Status: DC | PRN

## 2018-05-30 NOTE — Telephone Encounter (Signed)
Scheduled appt per 2/13 sch message - pt received chemo appts at chemo edu .

## 2018-06-02 ENCOUNTER — Other Ambulatory Visit (HOSPITAL_COMMUNITY): Payer: BC Managed Care – PPO

## 2018-06-02 ENCOUNTER — Other Ambulatory Visit: Payer: BC Managed Care – PPO

## 2018-06-03 ENCOUNTER — Ambulatory Visit (HOSPITAL_COMMUNITY)
Admission: RE | Admit: 2018-06-03 | Discharge: 2018-06-03 | Disposition: A | Discharge status: Home / Self Care | Payer: BC Managed Care – PPO | Source: Ambulatory Visit | Attending: Hematology | Admitting: Hematology

## 2018-06-03 DIAGNOSIS — C50511 Malignant neoplasm of lower-outer quadrant of right female breast: Secondary | ICD-10-CM

## 2018-06-03 DIAGNOSIS — C50311 Malignant neoplasm of lower-inner quadrant of right female breast: Secondary | ICD-10-CM | POA: Diagnosis not present

## 2018-06-03 DIAGNOSIS — Z170 Estrogen receptor positive status [ER+]: Secondary | ICD-10-CM | POA: Diagnosis present

## 2018-06-03 MED ORDER — GADOBUTROL 1 MMOL/ML IV SOLN
6.0000 mL | Freq: Once | INTRAVENOUS | Status: AC | PRN
  Administered 2018-06-03: 6 mL via INTRAVENOUS

## 2018-06-05 ENCOUNTER — Ambulatory Visit (HOSPITAL_COMMUNITY)
Admission: RE | Admit: 2018-06-05 | Discharge: 2018-06-05 | Disposition: A | Discharge status: Home / Self Care | Payer: BC Managed Care – PPO | Source: Ambulatory Visit | Attending: Hematology | Admitting: Hematology

## 2018-06-05 ENCOUNTER — Telehealth: Payer: Self-pay | Admitting: *Deleted

## 2018-06-05 ENCOUNTER — Inpatient Hospital Stay (HOSPITAL_BASED_OUTPATIENT_CLINIC_OR_DEPARTMENT_OTHER): Payer: BC Managed Care – PPO | Admitting: Hematology

## 2018-06-05 ENCOUNTER — Encounter: Payer: Self-pay | Admitting: Hematology

## 2018-06-05 VITALS — BP 107/66 | HR 63 | Temp 97.7°F | Resp 18 | Ht 69.0 in | Wt 139.3 lb

## 2018-06-05 DIAGNOSIS — C50811 Malignant neoplasm of overlapping sites of right female breast: Secondary | ICD-10-CM

## 2018-06-05 DIAGNOSIS — Z170 Estrogen receptor positive status [ER+]: Secondary | ICD-10-CM

## 2018-06-05 DIAGNOSIS — C773 Secondary and unspecified malignant neoplasm of axilla and upper limb lymph nodes: Secondary | ICD-10-CM

## 2018-06-05 DIAGNOSIS — C50311 Malignant neoplasm of lower-inner quadrant of right female breast: Secondary | ICD-10-CM

## 2018-06-05 DIAGNOSIS — C50511 Malignant neoplasm of lower-outer quadrant of right female breast: Secondary | ICD-10-CM | POA: Diagnosis not present

## 2018-06-05 DIAGNOSIS — Z5112 Encounter for antineoplastic immunotherapy: Secondary | ICD-10-CM | POA: Diagnosis not present

## 2018-06-05 DIAGNOSIS — J45909 Unspecified asthma, uncomplicated: Secondary | ICD-10-CM

## 2018-06-05 LAB — GLUCOSE, CAPILLARY: Glucose-Capillary: 75 mg/dL (ref 70–99)

## 2018-06-05 MED ORDER — FLUDEOXYGLUCOSE F - 18 (FDG) INJECTION
7.6300 | Freq: Once | INTRAVENOUS | Status: AC | PRN
  Administered 2018-06-05: 7.63 via INTRAVENOUS

## 2018-06-05 MED ORDER — DEXAMETHASONE 4 MG PO TABS: ORAL_TABLET | ORAL | 1 refills | Status: DC

## 2018-06-05 NOTE — Progress Notes (Signed)
Kim [name]   [phone] [phone]) [phone]   Clinic Follow up Note   Patient Care Team: [name], MD as PCP - General (Family Medicine) [name], MD as Consulting Physician (General Surgery) [name], MD as Consulting Physician (Hematology) [name], MD as Consulting Physician (Radiation Oncology) [name], MD as Consulting Physician (Obstetrics and Gynecology) [name], RN as Oncology Nurse Navigator [name], RN as Oncology Nurse Navigator 06/05/2018  CHIEF COMPLAINT: F/u multifocal right breast cancer   SUMMARY OF ONCOLOGIC HISTORY: Oncology History   Cancer Staging Cancer of overlapping sites of right female breast [facility]) Staging form: Breast, AJCC 8th Edition - Clinical stage from 05/28/2018: Stage IB (cT2, cN1, cM0, G3, ER+, PR+, HER2+) - Signed by [name], MD on 05/28/2018  Malignant neoplasm of lower-outer quadrant of right breast of female, estrogen receptor positive ([place]) Staging form: Breast, AJCC 8th Edition - Clinical stage from 05/28/2018: Stage IIB (cT2, cN1, cM0, G3, ER+, PR+, HER2-) - Unsigned       Cancer of overlapping sites of right female breast ([place])   05/14/2018 Mammogram    Right breast mammogram 05/14/18  IMPRESSION  There is a 1.0 x 0.5 x 1.2 cm lobular hypoechoic mass right breast 7 o'clock position 3 cm from nipple, a 0.8 x 0.6 x 1.2 cm lobular hypoechoic mass right breast 7 o'clock position 2 cm from the nipple, and a 0.9 x 0.5 x 0.8 cm lobular hypoechoic mass right breast 5 o'clock position 3 cm from nipple.  There is a 3.8 x 1.0 x 1.3 cm lobular hypoechoic mass right breast 5 o'clock position 2 cm from nipple.  Multiple thickened right axillary lymph nodes (approximately 15). Measuring up to 1.0 cm in thickness. Adenopathy corresponds with axillary palpable abnormality.  Indeterminate round and punctate calcifications upper-outer quadrant right breast.Indeterminate round  and punctate calcifications upper-outer quadrant right breast.    05/21/2018 Initial Biopsy    Diagnosis 05/21/18  1. Breast, right, needle core biopsy, axilla, 5 o'clock, ribbon clip - INVASIVE MAMMARY CARCINOMA. - MAMMARY CARCINOMA IN SITU. 2. Breast, right, needle core biopsy, 7 o'clock, coil clip - INVASIVE MAMMARY CARCINOMA. - MAMMARY CARCINOMA IN SITU. 3. Lymph node, needle/core biopsy, right axillary LN, hydromark - METASTATIC MAMMARY CARCINOMA.     05/21/2018 Receptors her2    1. PROGNOSTIC INDICATORS Results: The tumor cells are EQUIVOCAL for Her2 (2+);  HER2 **NEGATIVE** by FISH   Estrogen Receptor: 80%, POSITIVE, MODERATE STAINING INTENSITY Progesterone Receptor: 5%, POSITIVE, MODERATE-WEAK STAINING INTENSITY Proliferation Marker Ki67: 10%  3. PROGNOSTIC INDICATORS Results: The tumor cells are POSITIVE for Her2 (3+). Estrogen Receptor: 90%, POSITIVE, STRONG STAINING INTENSITY Progesterone Receptor: 50%, POSITIVE, MODERATE STAINING INTENSITY Proliferation Marker Ki67: 5%    05/26/2018 Initial Diagnosis    Malignant neoplasm of lower-inner quadrant of right breast of female, estrogen receptor positive ([place])    05/27/2018 Mammogram    Left breast mammogram 05/27/18  IMPRESSION: No mammographic evidence of LEFT breast malignancy.    05/28/2018 Cancer Staging    Staging form: Breast, AJCC 8th Edition - Clinical stage from 05/28/2018: Stage IB (cT2, cN1, cM0, G3, ER+, PR+, HER2+) - Signed by [name], MD on 05/28/2018    06/03/2018 Imaging    MR BREAST BILATERAL W WO CONTRAST INC CAD  IMPRESSION: 1. Large enhancing masses and non mass enhancement identified throughout the majority of the right breast. Additionally, there are smaller masses identified within the upper inner and lower inner right breast. Overall  findings are concerning for extensive malignancy throughout all 4 quadrants of the right breast. 2. Extensive bulky lymphadenopathy identified within the  right axilla. The matted appearance makes counting the number of abnormal lymph nodes difficult as they are immediately approximated to each other. 3. At least one abnormal appearing right internal mammary lymph node. 4. Nonenhancing lesion within the sternum with associated sternal marrow heterogeneity, raising the possibility of osseous metastatic disease within the sternum. 5. Two enhancing masses within the left breast as described above.      06/05/2018 PET scan    NM PET Image Initial (PI) Skull Base To Thigh  IMPRESSION: 1. Multiple borderline enlarged right axillary lymph nodes identified exhibiting mild to moderate increased uptake. Can not rule out nodal metastasis. Correlation with biopsy recommended. 2. Asymmetric increased uptake within the right breast corresponding to MRI abnormality. 3. Subcentimeter right retropectoral and right internal mammary lymph nodes are again identified and exhibit nonspecific, low level FDG uptake. 4. Noncalcified solid nodule within the lateral right lower lobe measures 9 mm without significant radiotracer uptake. 5. No hypermetabolic osseous lesions identified.       Chemotherapy    Neoadjuvant chemo TCHP every 3 weeks      CURRENT THERAPY  PENDING neoadjuvant chemotherapy TCHP, starting 06/09/2018  INTERVAL HISTORY: Kim [name] is a 43 y.o. female who is here for follow-up. Since last visit she had a bilateral breast MRI and a echocardiogram. Today, she is here with her husband and is doing well. She took out her IUD. Since her last visit she denies any new symptoms. We reviewed her PET scan, and I answered questions she had. The pt found a research study online about  Fasting during chemotherapy and she is interested in fasting before and after chemotherapy treatment. In the past she's had heart burn.   Pertinent positives and negatives of review of systems are listed and detailed within the above HPI.  REVIEW OF SYSTEMS:    Constitutional: Denies fevers, chills or abnormal weight loss Eyes: Denies blurriness of vision Ears, nose, mouth, throat, and face: Denies mucositis or sore throat Respiratory: Denies cough, dyspnea or wheezes Cardiovascular: Denies palpitation, chest discomfort or lower extremity swelling Gastrointestinal:  Denies nausea, heartburn or change in bowel habits Skin: Denies abnormal skin rashes Lymphatics: Denies new lymphadenopathy or easy bruising Neurological:Denies numbness, tingling or new weaknesses Behavioral/Psych: Mood is stable, no new changes  All other systems were reviewed with the patient and are negative.  MEDICAL HISTORY:  Past Medical History:  Diagnosis Date  . Acid reflux   . Acute bronchitis   . Anemia   . Asthma   . Migraine     SURGICAL HISTORY: Past Surgical History:  Procedure Laterality Date  . BREAST BIOPSY Right 05/23/2018   x3, malignant  . DILATION AND CURETTAGE OF UTERUS     2006 x1, 2009 x2  . LAPAROTOMY  2008    I have reviewed the social history and family history with the patient and they are unchanged from previous note.  ALLERGIES:  has No Known Allergies.  MEDICATIONS:  Current Outpatient Medications  Medication Sig Dispense Refill  . albuterol (PROVENTIL HFA;VENTOLIN HFA) 108 (90 BASE) MCG/ACT inhaler Inhale 2 puffs into the lungs every 6 (six) hours as needed for wheezing or shortness of breath.    . ALPRAZolam (XANAX) 0.5 MG tablet Take 0.5 mg by mouth daily as needed for anxiety. 3 to 5 times month    . calcium carbonate (TUMS - DOSED IN  MG ELEMENTAL CALCIUM) 500 MG chewable tablet Chew 1 tablet by mouth daily as needed. heartburn    . cetirizine (ZYRTEC) 10 MG tablet Take 10 mg by mouth daily as needed for allergies.    . fish oil-omega-3 fatty acids 1000 MG capsule Take 1 g by mouth daily.     Marland Kitchen lidocaine-prilocaine (EMLA) cream Apply 1 application topically as needed. 30 g 0  . ondansetron (ZOFRAN) 8 MG tablet Take 1 tablet (8  mg total) by mouth every 8 (eight) hours as needed for nausea or vomiting. 20 tablet 0  . Probiotic Product (PROBIOTIC DAILY PO) Take 1 capsule by mouth daily.     . prochlorperazine (COMPAZINE) 10 MG tablet Take 1 tablet (10 mg total) by mouth every 6 (six) hours as needed for nausea or vomiting. 30 tablet 0  . propranolol (INDERAL) 10 MG tablet Take 5 mg by mouth daily as needed. Takes as needed for shaking hand less than 10 times per year    . dexamethasone (DECADRON) 4 MG tablet Take 1 tab twice daily, start the day before Taxotere. Take once the day after X3 days 30 tablet 1   No current facility-administered medications for this visit.     PHYSICAL EXAMINATION: ECOG PERFORMANCE STATUS: 0 - Asymptomatic  Vitals:   06/05/18 1531  BP: 107/66  Pulse: 63  Resp: 18  Temp: 97.7 F (36.5 C)  SpO2: 100%   Filed Weights   06/05/18 1531  Weight: 139 lb 4.8 oz (63.2 kg)    GENERAL:alert, no distress and comfortable SKIN: skin color, texture, turgor are normal, no rashes or significant lesions EYES: normal, Conjunctiva are pink and non-injected, sclera clear OROPHARYNX:no exudate, no erythema and lips, buccal mucosa, and tongue normal  NECK: supple, thyroid normal size, non-tender, without nodularity LYMPH:  no palpable lymphadenopathy in the cervical, axillary or inguinal LUNGS: clear to auscultation and percussion with normal breathing effort HEART: regular rate & rhythm and no murmurs and no lower extremity edema ABDOMEN:abdomen soft, non-tender and normal bowel sounds Musculoskeletal:no cyanosis of digits and no clubbing  NEURO: alert & oriented x 3 with fluent speech, no focal motor/sensory deficits Breast exam not done today   LABORATORY DATA:  I have reviewed the data as listed CBC Latest Ref Rng & Units 05/28/2018 06/17/2014 06/15/2014  WBC 4.0 - 10.5 K/uL 4.1 5.5 4.1  Hemoglobin 12.0 - 15.0 g/dL 13.5 9.7(L) 10.3(L)  Hematocrit 36.0 - 46.0 % 40.8 29.2(L) 31.0(L)  Platelets  150 - 400 K/uL 203 114(L) 131(L)     CMP Latest Ref Rng & Units 05/28/2018  Glucose 70 - 99 mg/dL 87  BUN 6 - 20 mg/dL 13  Creatinine 0.44 - 1.00 mg/dL 0.80  Sodium 135 - 145 mmol/L 141  Potassium 3.5 - 5.1 mmol/L 4.2  Chloride 98 - 111 mmol/L 105  CO2 22 - 32 mmol/L 28  Calcium 8.9 - 10.3 mg/dL 9.2  Total Protein 6.5 - 8.1 g/dL 7.6  Total Bilirubin 0.3 - 1.2 mg/dL 0.7  Alkaline Phos 38 - 126 U/L 57  AST 15 - 41 U/L 8(L)  ALT 0 - 44 U/L 10   PROCEDURE: 05/30/2018 Echocardiogram  IMPRESSIONS:  1. The left ventricle has normal systolic function, with an ejection fraction of 55-60%. The cavity size was normal. Left ventricular diastolic parameters were normal No evidence of left ventricular regional wall motion abnormalities. GLS -27.1%,  normal.  2. The right ventricle has normal systolic function. The cavity was normal. There is no  increase in right ventricular wall thickness.  3. The mitral valve is normal in structure. No evidence of mitral valve stenosis. No regurgitation.  4. The tricuspid valve is normal in structure.  5. The aortic valve is tricuspid no stenosis of the aortic valve.  6. The pulmonic valve was normal in structure.  7. The aortic root and ascending aorta are normal in size and structure.  8. No evidence of left ventricular regional wall motion abnormalities.  9. Right atrial pressure is estimated at 3 mmHg. 10. PA systolic pressure 18 mmHg. 11. Normal study.    RADIOGRAPHIC STUDIES: I have personally reviewed the radiological images as listed and agreed with the findings in the report. Nm Pet Image Initial (pi) Skull Base To Thigh  Result Date: 06/05/2018 CLINICAL DATA:  Initial treatment strategy for breast cancer. EXAM: NUCLEAR MEDICINE PET SKULL BASE TO THIGH TECHNIQUE: 7.6 mCi F-18 FDG was injected intravenously. Full-ring PET imaging was performed from the skull base to thigh after the radiotracer. CT data was obtained and used for attenuation  correction and anatomic localization. Fasting blood glucose: 75 mg/dl COMPARISON:  None. FINDINGS: Mediastinal blood pool activity: SUV max 1.48 NECK: No hypermetabolic lymph nodes in the neck. Incidental CT findings: none CHEST: Within the right axilla there are multiple FDG avid lymph nodes. Index right axillary node measures 1 cm and has an SUV max 2.97. Index right axillary lymph node measures 1.4 cm and has an SUV max of 4.04. Small retropectoral lymph node measures 7 mm and exhibits mild uptake within SUV max of 2.02. Tiny right internal mammary lymph node measuring 4 mm is identified and exhibits very low level uptake within SUV max of 1.05. There is asymmetric increased uptake within the lateral right breast near the chest wall within SUV max of 2.83. within the more inferior right breast there is increased uptake corresponding to the soft tissue biopsy clip within SUV max of 2.7. No hypermetabolic lymph nodes within the mediastinum or hilum. Solid round nodule within the lateral right lower lobe measures 9 mm and has an SUV max of 0.47. Incidental CT findings: None ABDOMEN/PELVIS: No abnormal uptake within the liver, pancreas, spleen, or adrenal glands. No hypermetabolic lymph nodes identified within the abdomen or pelvis. Incidental CT findings: Intermediate to low-attenuation structure within the posterior right lobe of liver measures 1.1 cm and 21 HU. No radiotracer uptake identified within this lesion on corresponding PET images. Low-density structure within the periphery of the lateral spleen measures 1.7 cm, exhibits no FDG uptake and is favored to represent a benign cyst. SKELETON: There are several well-defined, trabecular lucencies within both iliac bones without evidence of cortical involvement and no corresponding increased radiotracer uptake. Favor benign bone lesions. Similarly there are multiple trabecular lucencies within the sternum without corresponding increased radiotracer uptake on PET  images. Incidental CT findings: none IMPRESSION: 1. Multiple borderline enlarged right axillary lymph nodes identified exhibiting mild to moderate increased uptake. Can not rule out nodal metastasis. Correlation with biopsy recommended. 2. Asymmetric increased uptake within the right breast corresponding to MRI abnormality. 3. Subcentimeter right retropectoral and right internal mammary lymph nodes are again identified and exhibit nonspecific, low level FDG uptake. 4. Noncalcified solid nodule within the lateral right lower lobe measures 9 mm without significant radiotracer uptake. 5. No hypermetabolic osseous lesions identified. Electronically Signed   By: Kerby Moors M.D.   On: 06/05/2018 14:47   06/05/2018 NM PET Image Initial (PI) Skull Base To Thigh  IMPRESSION: 1. Multiple  borderline enlarged right axillary lymph nodes identified exhibiting mild to moderate increased uptake. Can not rule out nodal metastasis. Correlation with biopsy recommended. 2. Asymmetric increased uptake within the right breast corresponding to MRI abnormality. 3. Subcentimeter right retropectoral and right internal mammary lymph nodes are again identified and exhibit nonspecific, low level FDG uptake. 4. Noncalcified solid nodule within the lateral right lower lobe measures 9 mm without significant radiotracer uptake. 5. No hypermetabolic osseous lesions identified.   06/03/2018 MR BREAST BILATERAL W WO CONTRAST INC CAD  IMPRESSION: 1. Large enhancing masses and non mass enhancement identified throughout the majority of the right breast. Additionally, there are smaller masses identified within the upper inner and lower inner right breast. Overall findings are concerning for extensive malignancy throughout all 4 quadrants of the right breast. 2. Extensive bulky lymphadenopathy identified within the right axilla. The matted appearance makes counting the number of abnormal lymph nodes difficult as they are  immediately approximated to each other. 3. At least one abnormal appearing right internal mammary lymph node. 4. Nonenhancing lesion within the sternum with associated sternal marrow heterogeneity, raising the possibility of osseous metastatic disease within the sternum. 5. Two enhancing masses within the left breast as described above.   05/27/2018 MM DIAG BREAST TOMO UNI LEFT  IMPRESSION: No mammographic evidence of LEFT breast malignancy.   ASSESSMENT & PLAN:  Kim [name] is a 43 y.o. female with history of  1.  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 She has a 3.8cm mass at 5:00 and 1.2cm mass at 7:00 position, plus 2 groups of calcification at 6-7 o'clock position which have not biopsied. [place] also showed 15 pabnormal lymph nodes.  -Biopsy showed ductal carcinoma, both ER strongly positive, PR weekly positive, similar morphology.  However there is discrepancy of her to test results between breast biopsy and axillary lymph node biopsy, we plan to repeat HER2 on her surgical sample.  -I discussed her MRI findings, which showed multifocal large enhancing masses and non-mass enhancement throughout the majority of the right breast, and additional 2 small area of enhancing masses within the left breast, which I recommend a biopsy.  She agrees. -I reviewed her PET scan images with patient and her husband in person, and discussed the findings.  In addition to the known right breast masses, right axillary adenopathy, she also has several centimeters right retropectoral and right internal mammary lymph nodes with low-level FDG uptake, are highly suspicious for metastatic adenopathy.  Also has a 9 mm nodule in the right lower lobe of lung, not FDG avid, but is suspicious for metastasis.  No other distant metastasis. -I  will discuss with IR to see if they can biopsy her RLL lung nodule -She is scheduled to start chemo TCHP next Monday  --Chemotherapy consent: Side effects including but does not not limited to, fatigue, nausea, vomiting, diarrhea, hair loss, neuropathy, fluid retention, renal and kidney dysfunction, neutropenic fever, needed for blood transfusion, bleeding, congestive heart failure, were discussed with patient in great detail. She agrees to proceed. -Her baseline echo is normal, I reviewed with her -The goal of therapy is curative -I have called in dexamethasone, Compazine, Zofran, and EMLA cream for her.  She knows to start dexamethasone 1 day before chemo -She is scheduled for port placement tomorrow -Plan to see her back 1 week after her  first cycle chemo for toxicity checkup  2. Genetic Testing -Had a genetic testing [facility] earlier this week  3. Asthma  -mild, continue albuterol inhaler as needed  4. Diet and fasting  -Pt's husband found some literature of fasting for cancer and chemo, I will review the data, pt may want to try fasting around chemo  -I encourage her to stay healthy and high protein diet during chemo -f/u with dietician   Plan  -Cancel genetic and lab and injection appointment on 3/4, she had at [facility] -she is scheduled for port tomorrow -first cycle TCHP on 2/24 with Udenyca on day 3  - I will refer her to a cardiologist  -f/u in one week for chemo toxicity checkup -left breast biopsy by GI next week -will discuss with IR about her right lung nodule biopsy     No problem-specific Assessment & Plan notes found for this encounter.   No orders of the defined types were placed in this encounter.  All questions were answered. The patient knows to call the clinic with any problems, questions or concerns. No barriers to learning was detected. I spent 30 minutes counseling the patient face to face. The total time spent in the appointment was 40 minutes and  more than 50% was on counseling and review of test results  I, Manson Allan am acting as scribe for Dr. [name].  I have reviewed the above documentation for accuracy and completeness, and I agree with the above.     [name], MD 06/05/2018

## 2018-06-05 NOTE — Progress Notes (Signed)
Ensure pre surgery drink given with instructions to complete by 1100 dos, pt verbalized understanding. 

## 2018-06-05 NOTE — Telephone Encounter (Signed)
  Oncology Nurse Navigator Documentation  Navigator Location: [facility] (06/05/18 1000)   )Navigator Encounter Type: Telephone;[facility] Follow-up (06/05/18 1000) Telephone: Outgoing Call;Clinic/[facility] Follow-up (06/05/18 1000)       Genetic Counseling Date: 06/18/18 (06/05/18 1000) Genetic Counseling Type: Non-Urgent (06/05/18 1000)       Treatment Initiated Date: 06/09/18 (06/05/18 1000)                                Time Spent with Patient: 15 (06/05/18 1000)

## 2018-06-06 ENCOUNTER — Ambulatory Visit (HOSPITAL_BASED_OUTPATIENT_CLINIC_OR_DEPARTMENT_OTHER)
Admission: RE | Admit: 2018-06-06 | Discharge: 2018-06-06 | Disposition: A | Discharge status: Home / Self Care | Payer: BC Managed Care – PPO | Source: Ambulatory Visit | Attending: General Surgery | Admitting: General Surgery

## 2018-06-06 ENCOUNTER — Ambulatory Visit (HOSPITAL_BASED_OUTPATIENT_CLINIC_OR_DEPARTMENT_OTHER): Payer: BC Managed Care – PPO | Admitting: Anesthesiology

## 2018-06-06 ENCOUNTER — Other Ambulatory Visit: Payer: Self-pay

## 2018-06-06 ENCOUNTER — Ambulatory Visit (HOSPITAL_COMMUNITY): Payer: BC Managed Care – PPO

## 2018-06-06 ENCOUNTER — Encounter (HOSPITAL_BASED_OUTPATIENT_CLINIC_OR_DEPARTMENT_OTHER): Payer: Self-pay

## 2018-06-06 ENCOUNTER — Telehealth: Payer: Self-pay | Admitting: Hematology

## 2018-06-06 ENCOUNTER — Other Ambulatory Visit: Payer: Self-pay | Admitting: Hematology

## 2018-06-06 ENCOUNTER — Encounter (HOSPITAL_BASED_OUTPATIENT_CLINIC_OR_DEPARTMENT_OTHER)
Admission: RE | Disposition: A | Discharge status: Home / Self Care | Payer: Self-pay | Source: Ambulatory Visit | Attending: General Surgery

## 2018-06-06 DIAGNOSIS — Z170 Estrogen receptor positive status [ER+]: Secondary | ICD-10-CM | POA: Insufficient documentation

## 2018-06-06 DIAGNOSIS — C50911 Malignant neoplasm of unspecified site of right female breast: Secondary | ICD-10-CM | POA: Diagnosis present

## 2018-06-06 DIAGNOSIS — Z419 Encounter for procedure for purposes other than remedying health state, unspecified: Secondary | ICD-10-CM

## 2018-06-06 DIAGNOSIS — C50811 Malignant neoplasm of overlapping sites of right female breast: Secondary | ICD-10-CM

## 2018-06-06 DIAGNOSIS — C50511 Malignant neoplasm of lower-outer quadrant of right female breast: Secondary | ICD-10-CM | POA: Diagnosis not present

## 2018-06-06 DIAGNOSIS — J45909 Unspecified asthma, uncomplicated: Secondary | ICD-10-CM | POA: Insufficient documentation

## 2018-06-06 DIAGNOSIS — Z95828 Presence of other vascular implants and grafts: Secondary | ICD-10-CM

## 2018-06-06 HISTORY — PX: PORTACATH PLACEMENT: SHX2246

## 2018-06-06 SURGERY — INSERTION, TUNNELED CENTRAL VENOUS DEVICE, WITH PORT
Anesthesia: General | Site: Chest | Laterality: Left

## 2018-06-06 MED ORDER — IOPAMIDOL (ISOVUE-300) INJECTION 61%
INTRAVENOUS | Status: AC
  Filled 2018-06-06: qty 50

## 2018-06-06 MED ORDER — CELECOXIB 200 MG PO CAPS
200.0000 mg | ORAL_CAPSULE | ORAL | Status: AC
  Administered 2018-06-06: 200 mg via ORAL

## 2018-06-06 MED ORDER — OXYCODONE HCL 5 MG PO TABS: 5.0000 mg | ORAL_TABLET | Freq: Once | ORAL | Status: DC | PRN

## 2018-06-06 MED ORDER — BUPIVACAINE HCL 0.5 % IJ SOLN
INTRAMUSCULAR | Status: DC | PRN
  Administered 2018-06-06: 8 mL

## 2018-06-06 MED ORDER — HEPARIN SOD (PORK) LOCK FLUSH 100 UNIT/ML IV SOLN
INTRAVENOUS | Status: DC | PRN
  Administered 2018-06-06: 400 [IU] via INTRAVENOUS

## 2018-06-06 MED ORDER — MIDAZOLAM HCL 2 MG/2ML IJ SOLN
1.0000 mg | INTRAMUSCULAR | Status: DC | PRN
  Administered 2018-06-06: 2 mg via INTRAVENOUS

## 2018-06-06 MED ORDER — CHLORHEXIDINE GLUCONATE CLOTH 2 % EX PADS: 6.0000 | MEDICATED_PAD | Freq: Once | CUTANEOUS | Status: DC

## 2018-06-06 MED ORDER — HEPARIN SOD (PORK) LOCK FLUSH 100 UNIT/ML IV SOLN
INTRAVENOUS | Status: AC
  Filled 2018-06-06: qty 5

## 2018-06-06 MED ORDER — DEXAMETHASONE SODIUM PHOSPHATE 10 MG/ML IJ SOLN
INTRAMUSCULAR | Status: AC
  Filled 2018-06-06: qty 1

## 2018-06-06 MED ORDER — ACETAMINOPHEN 500 MG PO TABS
1000.0000 mg | ORAL_TABLET | ORAL | Status: AC
  Administered 2018-06-06: 1000 mg via ORAL

## 2018-06-06 MED ORDER — HYDROCODONE-ACETAMINOPHEN 5-325 MG PO TABS: 1.0000 | ORAL_TABLET | Freq: Four times a day (QID) | ORAL | 0 refills | Status: DC | PRN

## 2018-06-06 MED ORDER — HEPARIN (PORCINE) IN NACL 2-0.9 UNITS/ML
INTRAMUSCULAR | Status: AC | PRN
  Administered 2018-06-06: 1 via INTRAVENOUS

## 2018-06-06 MED ORDER — CEFAZOLIN SODIUM-DEXTROSE 2-4 GM/100ML-% IV SOLN
2.0000 g | INTRAVENOUS | Status: AC
  Administered 2018-06-06: 2 g via INTRAVENOUS

## 2018-06-06 MED ORDER — EPHEDRINE SULFATE 50 MG/ML IJ SOLN
INTRAMUSCULAR | Status: DC | PRN
  Administered 2018-06-06: 10 mg via INTRAVENOUS

## 2018-06-06 MED ORDER — ACETAMINOPHEN 10 MG/ML IV SOLN: 1000.0000 mg | Freq: Once | INTRAVENOUS | Status: DC | PRN

## 2018-06-06 MED ORDER — PROPOFOL 10 MG/ML IV BOLUS
INTRAVENOUS | Status: DC | PRN
  Administered 2018-06-06: 150 mg via INTRAVENOUS

## 2018-06-06 MED ORDER — ACETAMINOPHEN 500 MG PO TABS
ORAL_TABLET | ORAL | Status: AC
  Filled 2018-06-06: qty 2

## 2018-06-06 MED ORDER — BUPIVACAINE HCL (PF) 0.25 % IJ SOLN
INTRAMUSCULAR | Status: AC
  Filled 2018-06-06: qty 30

## 2018-06-06 MED ORDER — LACTATED RINGERS IV SOLN
INTRAVENOUS | Status: DC
  Administered 2018-06-06: 13:00:00 via INTRAVENOUS

## 2018-06-06 MED ORDER — ACETAMINOPHEN 500 MG PO TABS: 1000.0000 mg | ORAL_TABLET | Freq: Once | ORAL | Status: DC | PRN

## 2018-06-06 MED ORDER — ONDANSETRON HCL 4 MG/2ML IJ SOLN
INTRAMUSCULAR | Status: AC
  Filled 2018-06-06: qty 2

## 2018-06-06 MED ORDER — HEPARIN (PORCINE) IN NACL 1000-0.9 UT/500ML-% IV SOLN
INTRAVENOUS | Status: AC
  Filled 2018-06-06: qty 500

## 2018-06-06 MED ORDER — EPHEDRINE 5 MG/ML INJ
INTRAVENOUS | Status: AC
  Filled 2018-06-06: qty 10

## 2018-06-06 MED ORDER — LIDOCAINE 2% (20 MG/ML) 5 ML SYRINGE
INTRAMUSCULAR | Status: AC
  Filled 2018-06-06: qty 5

## 2018-06-06 MED ORDER — ONDANSETRON HCL 4 MG/2ML IJ SOLN
INTRAMUSCULAR | Status: DC | PRN
  Administered 2018-06-06: 4 mg via INTRAVENOUS

## 2018-06-06 MED ORDER — GABAPENTIN 300 MG PO CAPS
300.0000 mg | ORAL_CAPSULE | ORAL | Status: AC
  Administered 2018-06-06: 300 mg via ORAL

## 2018-06-06 MED ORDER — FENTANYL CITRATE (PF) 100 MCG/2ML IJ SOLN: 25.0000 ug | INTRAMUSCULAR | Status: DC | PRN

## 2018-06-06 MED ORDER — CEFAZOLIN SODIUM-DEXTROSE 2-4 GM/100ML-% IV SOLN
INTRAVENOUS | Status: AC
  Filled 2018-06-06: qty 100

## 2018-06-06 MED ORDER — FENTANYL CITRATE (PF) 100 MCG/2ML IJ SOLN
50.0000 ug | INTRAMUSCULAR | Status: DC | PRN
  Administered 2018-06-06: 100 ug via INTRAVENOUS

## 2018-06-06 MED ORDER — MIDAZOLAM HCL 2 MG/2ML IJ SOLN
INTRAMUSCULAR | Status: AC
  Filled 2018-06-06: qty 2

## 2018-06-06 MED ORDER — SCOPOLAMINE 1 MG/3DAYS TD PT72: 1.0000 | MEDICATED_PATCH | Freq: Once | TRANSDERMAL | Status: DC | PRN

## 2018-06-06 MED ORDER — LIDOCAINE 2% (20 MG/ML) 5 ML SYRINGE
INTRAMUSCULAR | Status: DC | PRN
  Administered 2018-06-06: 40 mg via INTRAVENOUS

## 2018-06-06 MED ORDER — DEXAMETHASONE SODIUM PHOSPHATE 4 MG/ML IJ SOLN
INTRAMUSCULAR | Status: DC | PRN
  Administered 2018-06-06: 10 mg via INTRAVENOUS

## 2018-06-06 MED ORDER — FENTANYL CITRATE (PF) 100 MCG/2ML IJ SOLN
INTRAMUSCULAR | Status: AC
  Filled 2018-06-06: qty 2

## 2018-06-06 MED ORDER — CELECOXIB 200 MG PO CAPS
ORAL_CAPSULE | ORAL | Status: AC
  Filled 2018-06-06: qty 1

## 2018-06-06 MED ORDER — ACETAMINOPHEN 160 MG/5ML PO SOLN: 1000.0000 mg | Freq: Once | ORAL | Status: DC | PRN

## 2018-06-06 MED ORDER — GABAPENTIN 300 MG PO CAPS
ORAL_CAPSULE | ORAL | Status: AC
  Filled 2018-06-06: qty 1

## 2018-06-06 MED ORDER — OXYCODONE HCL 5 MG/5ML PO SOLN: 5.0000 mg | Freq: Once | ORAL | Status: DC | PRN

## 2018-06-06 SURGICAL SUPPLY — 54 items
BAG DECANTER FOR FLEXI CONT (MISCELLANEOUS) ×2 IMPLANT
BLADE SURG 15 STRL LF DISP TIS (BLADE) ×1 IMPLANT
BLADE SURG 15 STRL SS (BLADE) ×1
CANISTER SUCT 1200ML W/VALVE (MISCELLANEOUS) IMPLANT
CHLORAPREP W/TINT 26ML (MISCELLANEOUS) ×2 IMPLANT
CLEANER CAUTERY TIP 5X5 PAD (MISCELLANEOUS) ×1 IMPLANT
COVER BACK TABLE 60X90IN (DRAPES) ×2 IMPLANT
COVER MAYO STAND STRL (DRAPES) ×2 IMPLANT
COVER WAND RF STERILE (DRAPES) IMPLANT
DECANTER SPIKE VIAL GLASS SM (MISCELLANEOUS) IMPLANT
DERMABOND ADVANCED (GAUZE/BANDAGES/DRESSINGS) ×1
DERMABOND ADVANCED .7 DNX12 (GAUZE/BANDAGES/DRESSINGS) ×1 IMPLANT
DRAPE C-ARM 42X72 X-RAY (DRAPES) ×2 IMPLANT
DRAPE LAPAROSCOPIC ABDOMINAL (DRAPES) ×2 IMPLANT
DRAPE UTILITY XL STRL (DRAPES) ×2 IMPLANT
ELECT REM PT RETURN 9FT ADLT (ELECTROSURGICAL) ×2
ELECTRODE REM PT RTRN 9FT ADLT (ELECTROSURGICAL) ×1 IMPLANT
GLOVE BIO SURGEON STRL SZ7.5 (GLOVE) ×2 IMPLANT
GLOVE BIOGEL PI IND STRL 6.5 (GLOVE) ×1 IMPLANT
GLOVE BIOGEL PI IND STRL 7.0 (GLOVE) ×1 IMPLANT
GLOVE BIOGEL PI IND STRL 8 (GLOVE) ×1 IMPLANT
GLOVE BIOGEL PI INDICATOR 6.5 (GLOVE) ×1
GLOVE BIOGEL PI INDICATOR 7.0 (GLOVE) ×1
GLOVE BIOGEL PI INDICATOR 8 (GLOVE) ×1
GLOVE SURG SS PI 6.5 STRL IVOR (GLOVE) ×2 IMPLANT
GLOVE SURG SYN 8.0 (GLOVE) ×2 IMPLANT
GOWN STRL REIN XL XLG (GOWN DISPOSABLE) ×2 IMPLANT
GOWN STRL REUS W/ TWL LRG LVL3 (GOWN DISPOSABLE) ×2 IMPLANT
GOWN STRL REUS W/ TWL XL LVL3 (GOWN DISPOSABLE) ×1 IMPLANT
GOWN STRL REUS W/TWL LRG LVL3 (GOWN DISPOSABLE) ×2
GOWN STRL REUS W/TWL XL LVL3 (GOWN DISPOSABLE) ×1
IV KIT MINILOC 20X1 SAFETY (NEEDLE) IMPLANT
KIT PORT POWER 8FR ISP CVUE (Port) ×2 IMPLANT
NDL SAFETY ECLIPSE 18X1.5 (NEEDLE) IMPLANT
NEEDLE HYPO 18GX1.5 SHARP (NEEDLE)
NEEDLE HYPO 22GX1.5 SAFETY (NEEDLE) IMPLANT
NEEDLE HYPO 25X1 1.5 SAFETY (NEEDLE) ×2 IMPLANT
NEEDLE SPNL 22GX3.5 QUINCKE BK (NEEDLE) IMPLANT
PACK BASIN DAY SURGERY FS (CUSTOM PROCEDURE TRAY) ×2 IMPLANT
PAD CLEANER CAUTERY TIP 5X5 (MISCELLANEOUS) ×1
PENCIL BUTTON HOLSTER BLD 10FT (ELECTRODE) ×2 IMPLANT
SLEEVE SCD COMPRESS KNEE MED (MISCELLANEOUS) IMPLANT
SUT MON AB 4-0 PC3 18 (SUTURE) ×2 IMPLANT
SUT PROLENE 2 0 SH DA (SUTURE) ×2 IMPLANT
SUT SILK 2 0 TIES 17X18 (SUTURE)
SUT SILK 2-0 18XBRD TIE BLK (SUTURE) IMPLANT
SUT VIC AB 3-0 SH 27 (SUTURE) ×1
SUT VIC AB 3-0 SH 27X BRD (SUTURE) ×1 IMPLANT
SYR 10ML LL (SYRINGE) IMPLANT
SYR 5ML LL (SYRINGE) ×2 IMPLANT
SYR CONTROL 10ML LL (SYRINGE) ×4 IMPLANT
TOWEL GREEN STERILE FF (TOWEL DISPOSABLE) ×4 IMPLANT
TUBE CONNECTING 20X1/4 (TUBING) IMPLANT
YANKAUER SUCT BULB TIP NO VENT (SUCTIONS) IMPLANT

## 2018-06-06 NOTE — Telephone Encounter (Signed)
Called patient and scheduled appt per 2/20 los.  Patient aware of the appts I scheduled, patient stated she will contact [place] [facility] to schedule her appt.

## 2018-06-06 NOTE — Discharge Instructions (Signed)
°  Post Anesthesia Home Care Instructions  NO TYLENOL UNTIL AFTER 7:00PM ON 06/06/2018.  NO IBUPROFEN UNTIL AFTER 7:00PM ON 06/06/2018.  Activity: Get plenty of rest for the remainder of the day. A responsible individual must stay with you for 24 hours following the procedure.  For the next 24 hours, DO NOT: -Drive a car -Paediatric nurse -Drink alcoholic beverages -Take any medication unless instructed by your physician -Make any legal decisions or sign important papers.  Meals: Start with liquid foods such as gelatin or soup. Progress to regular foods as tolerated. Avoid greasy, spicy, heavy foods. If nausea and/or vomiting occur, drink only clear liquids until the nausea and/or vomiting subsides. Call your physician if vomiting continues.  Special Instructions/Symptoms: Your throat may feel dry or sore from the anesthesia or the breathing tube placed in your throat during surgery. If this causes discomfort, gargle with warm salt water. The discomfort should disappear within 24 hours.  If you had a scopolamine patch placed behind your ear for the management of post- operative nausea and/or vomiting:  1. The medication in the patch is effective for 72 hours, after which it should be removed.  Wrap patch in a tissue and discard in the trash. Wash hands thoroughly with soap and water. 2. You may remove the patch earlier than 72 hours if you experience unpleasant side effects which may include dry mouth, dizziness or visual disturbances. 3. Avoid touching the patch. Wash your hands with soap and water after contact with the patch.

## 2018-06-06 NOTE — Transfer of Care (Signed)
Immediate Anesthesia Transfer of Care Note  Patient: Kim [name]  Procedure(s) Performed: INSERTION PORT-A-CATH POSSIBLE ULTRASOUND (Left Chest)  Patient Location: PACU  Anesthesia Type:General  Level of Consciousness: awake and sedated  Airway & Oxygen Therapy: Patient Spontanous Breathing and Patient connected to face mask oxygen  Post-op Assessment: Report given to RN and Post -op Vital signs reviewed and stable  Post vital signs: Reviewed and stable  Last Vitals:  Vitals Value Taken Time  BP 90/53 06/06/2018  2:03 PM  Temp    Pulse 56 06/06/2018  2:05 PM  Resp 12 06/06/2018  2:05 PM  SpO2 100 % 06/06/2018  2:05 PM  Vitals shown include unvalidated device data.  Last Pain:  Vitals:   06/06/18 1244  TempSrc: Oral  PainSc: 0-No pain         Complications: No apparent anesthesia complications

## 2018-06-06 NOTE — Addendum Note (Signed)
Addended by: [name] on: 06/06/2018 08:28 AM   Modules accepted: Orders

## 2018-06-06 NOTE — Anesthesia Preprocedure Evaluation (Addendum)
Anesthesia Evaluation  Patient identified by MRN, date of birth, ID band Patient awake    Reviewed: Allergy & Precautions, NPO status , Patient's Chart, lab work & pertinent test results  History of Anesthesia Complications Negative for: history of anesthetic complications  Airway Mallampati: I  TM Distance: >3 FB Neck ROM: Full    Dental  (+) Teeth Intact   Pulmonary neg pulmonary ROS,    breath sounds clear to auscultation       Cardiovascular negative cardio ROS   Rhythm:Regular     Neuro/Psych negative neurological ROS  negative psych ROS   GI/Hepatic negative GI ROS, Neg liver ROS,   Endo/Other  negative endocrine ROS  Renal/GU negative Renal ROS     Musculoskeletal negative musculoskeletal ROS (+)   Abdominal   Peds  Hematology negative hematology ROS (+)   Anesthesia Other Findings   Reproductive/Obstetrics IUD removed yesterday                            Anesthesia Physical Anesthesia Plan  ASA: II  Anesthesia Plan: General   Post-op Pain Management:    Induction: Intravenous  PONV Risk Score and Plan: 3 and Ondansetron and Dexamethasone  Airway Management Planned: LMA  Additional Equipment: None  Intra-op Plan:   Post-operative Plan: Extubation in OR  Informed Consent: I have reviewed the patients History and Physical, chart, labs and discussed the procedure including the risks, benefits and alternatives for the proposed anesthesia with the patient or authorized representative who has indicated his/her understanding and acceptance.     Dental advisory given  Plan Discussed with: CRNA and Surgeon  Anesthesia Plan Comments:         Anesthesia Quick Evaluation

## 2018-06-06 NOTE — Op Note (Signed)
06/06/2018  1:57 PM  PATIENT:  Kim [name]  43 y.o. female  PRE-OPERATIVE DIAGNOSIS:  RIGHT BREAST CANCER  POST-OPERATIVE DIAGNOSIS:  RIGHT BREAST CANCER  PROCEDURE:  Procedure(s): INSERTION PORT-A-CATH (left subclavian vein)  SURGEON:  Surgeon(s) and Role:    * [name], MD - Primary  PHYSICIAN ASSISTANT:   ASSISTANTS: none   ANESTHESIA:   local and general  EBL:  5 mL   BLOOD ADMINISTERED:none  DRAINS: none   LOCAL MEDICATIONS USED:  MARCAINE     SPECIMEN:  No Specimen  DISPOSITION OF SPECIMEN:  N/A  COUNTS:  YES  TOURNIQUET:  * No tourniquets in log *  DICTATION: .Dragon Dictation   After informed consent was obtained the patient was brought to the operating room and placed in the supine position on the operating table.  After adequate induction of general anesthesia a roll was placed between the patient's shoulder blades to extend the shoulder slightly.  The left chest and neck area were then prepped with ChloraPrep, allowed to dry, and draped in usual sterile manner.  An appropriate timeout was performed.  The patient was placed in Trendelenburg position.  The area lateral to the bend of the clavicle was infiltrated with quarter percent Marcaine.  A large bore needle from the Port-A-Cath kit was used to slide beneath the bend of the clavicle on the left chest wall heading towards the sternal notch and in doing so I was able to access the left subclavian vein without difficulty.  A wire was fed through the needle using the Seldinger technique without difficulty.  The wire was confirmed in the central venous system using real-time fluoroscopy.  Next a small incision was made at the wire entry site with a 15 blade knife.  The incision was carried through the skin and subcutaneous tissue sharply with the electrocautery.  A subcutaneous pocket was then created inferior to the incision with blunt finger dissection.  Next the tubing was placed on the reservoir.  The  reservoir was placed in the pocket and the length of the tubing was estimated again using real-time fluoroscopy.  The tubing was cut to the appropriate length.  Next a sheath and dilator were fed over the wire also using the Seldinger technique without difficulty.  The dilator and wire were removed.  The tubing was fed through the sheath as far as it would go and then held in place while the sheath was gently cracked and separated.  Another real-time fluoroscopy image showed the tip of the catheter to be in the distal superior vena cava.  The tubing was then permanently anchored to the reservoir.  The reservoir was anchored in the pocket with two 2-0 Prolene stitches.  The port was then aspirated and it aspirated blood easily.  The port was then flushed initially with a dilute heparin solution and then with a more concentrated heparin solution.  The subcutaneous tissue was closed over the port with interrupted 3-0 Vicryl stitches.  The skin was then closed with a running 4-0 Monocryl subcuticular stitch.  Dermabond dressings were applied.  The patient tolerated the procedure well.  At the end of the case all needle sponge and instrument counts were correct.  The patient was then awakened and taken to recovery in stable condition.  PLAN OF CARE: Discharge to home after PACU  PATIENT DISPOSITION:  PACU - hemodynamically stable.   Delay start of Pharmacological VTE agent (>24hrs) due to surgical blood loss or risk of bleeding: not applicable

## 2018-06-06 NOTE — Anesthesia Procedure Notes (Signed)
Procedure Name: LMA Insertion Performed by: Shirle Provencal W, CRNA Pre-anesthesia Checklist: Patient identified, Emergency Drugs available, Suction available and Patient being monitored Patient Re-evaluated:Patient Re-evaluated prior to induction Oxygen Delivery Method: Circle system utilized Preoxygenation: Pre-oxygenation with 100% oxygen Induction Type: IV induction Ventilation: Mask ventilation without difficulty LMA: LMA inserted LMA Size: 4.0 Number of attempts: 1 Placement Confirmation: positive ETCO2 Tube secured with: Tape Dental Injury: Teeth and Oropharynx as per pre-operative assessment        

## 2018-06-06 NOTE — H&P (Signed)
Kim [name]  Location: [facility] Patient #: [id number] DOB: [birth year] Married / Language: English / Race: White Female   History of Present Illness The patient is a 43 year old female who presents with breast cancer. We are asked to see the patient in consultation by Dr. Sondra Come to evaluate her for a new right breast cancer. The patient is a 43 year old white female who presents with a palpable mass in the right breast. The mammogram showed a 1.2cm mass at [facility], a 3.2cm mass at [facility], and calcs at [facility] measuring 57m and 1.7cm. There were about 15 abnormal looking lymph nodes, one of which was biopsied and was positive for breast cancer. 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 She does not smoke and is otherwise healthy. She has always felt lumps in the right breast. She also plays the violin   Past Surgical History  Breast Biopsy  Right.  Diagnostic Studies History  Colonoscopy  >10 years ago Mammogram  within last year Pap Smear  >5 years ago  Medication History  Medications Reconciled  Social History  Alcohol use  Moderate alcohol use. Caffeine use  Coffee, Tea. No drug use  Tobacco use  Never smoker.  Family History  Arthritis  Family Members In General. Bleeding disorder  Mother. Breast Cancer  Family Members In General. Cancer  Sister. Depression  Sister. Diabetes Mellitus  Family Members In General. Heart Disease  Family Members In General. Hypertension  Family Members In General. Migraine Headache  Father. Respiratory Condition  Family Members In General. Thyroid problems  Mother.  Pregnancy / Birth History Age at menarche  89years. Contraceptive History  Intrauterine device. Gravida  4 Maternal age  45-35Para  2  Other Problems Asthma  Gastroesophageal Reflux Disease  Lump In Breast     Review of Systems  General Not Present-  Appetite Loss, Chills, Fatigue, Fever, Night Sweats, Weight Gain and Weight Loss. Skin Not Present- Change in Wart/Mole, Dryness, Hives, Jaundice, New Lesions, Non-Healing Wounds, Rash and Ulcer. HEENT Present- Seasonal Allergies. Not Present- Earache, Hearing Loss, Hoarseness, Nose Bleed, Oral Ulcers, Ringing in the Ears, Sinus Pain, Sore Throat, Visual Disturbances, Wears glasses/contact lenses and Yellow Eyes. Respiratory Not Present- Bloody sputum, Chronic Cough, Difficulty Breathing, Snoring and Wheezing. Breast Present- Breast Mass. Not Present- Breast Pain, Nipple Discharge and Skin Changes. Cardiovascular Not Present- Chest Pain, Difficulty Breathing Lying Down, Leg Cramps, Palpitations, Rapid Heart Rate, Shortness of Breath and Swelling of Extremities. Gastrointestinal Not Present- Abdominal Pain, Bloating, Bloody Stool, Change in Bowel Habits, Chronic diarrhea, Constipation, Difficulty Swallowing, Excessive gas, Gets full quickly at meals, Hemorrhoids, Indigestion, Nausea, Rectal Pain and Vomiting. Female Genitourinary Not Present- Frequency, Nocturia, Painful Urination, Pelvic Pain and Urgency. Musculoskeletal Not Present- Back Pain, Joint Pain, Joint Stiffness, Muscle Pain, Muscle Weakness and Swelling of Extremities. Neurological Not Present- Decreased Memory, Fainting, Headaches, Numbness, Seizures, Tingling, Tremor, Trouble walking and Weakness. Psychiatric Not Present- Anxiety, Bipolar, Change in Sleep Pattern, Depression, Fearful and Frequent crying. Endocrine Not Present- Cold Intolerance, Excessive Hunger, Hair Changes, Heat Intolerance, Hot flashes and New Diabetes.   Physical Exam  General Mental Status-Alert. General Appearance-Consistent with stated age. Hydration-Well hydrated. Voice-Normal.  Head and Neck Head-normocephalic, atraumatic with no lesions or palpable masses. Trachea-midline. Thyroid Gland Characteristics - normal size and  consistency.  Eye Eyeball - Bilateral-Extraocular movements intact. Sclera/Conjunctiva - Bilateral-No scleral icterus.  Chest and Lung Exam Chest and lung exam reveals -quiet, even and easy respiratory effort with no use of accessory muscles and on auscultation, normal breath sounds, no adventitious sounds and normal vocal resonance. Inspection Chest Wall - Normal. Back - normal.  Breast Note: there are 2 small masses in the lower right breast and an area of general thickness in the UOQ of right breast with multiple palpable enlarged lymph nodes on right. there is no palpable left breast mass or left axillary, supraclavicular, or cervical lymphadenopathy   Cardiovascular Cardiovascular examination reveals -normal heart sounds, regular rate and rhythm with no murmurs and normal pedal pulses bilaterally.  Abdomen Inspection Inspection of the abdomen reveals - No Hernias. Skin - Scar - no surgical scars. Palpation/Percussion Palpation and Percussion of the abdomen reveal - Soft, Non Tender, No Rebound tenderness, No Rigidity (guarding) and No hepatosplenomegaly. Auscultation Auscultation of the abdomen reveals - Bowel sounds normal.  Neurologic Neurologic evaluation reveals -alert and oriented x 3 with no impairment of recent or remote memory. Mental Status-Normal.  Musculoskeletal Normal Exam - Left-Upper Extremity Strength Normal and Lower Extremity Strength Normal. Normal Exam - Right-Upper Extremity Strength Normal and Lower Extremity Strength Normal.  Lymphatic Head & Neck  General Head & Neck Lymphatics: Bilateral - Description - Normal. Axillary  General Axillary Region: Bilateral - Description - Normal. Tenderness - Non Tender. Femoral & Inguinal  Generalized Femoral & Inguinal Lymphatics: Bilateral - Description - Normal. Tenderness - Non Tender.    Assessment & Plan  MALIGNANT NEOPLASM OF LOWER-OUTER QUADRANT OF RIGHT BREAST OF FEMALE, ESTROGEN  RECEPTOR POSITIVE (C50.511) Impression: the patient appears to have breast cancer involving multiple quadrants of the right breast with multiple positive lymph nodes. Because of this and because one of her biopies was Her2 + she will need chemotherapy. I think she may benefit from getting this neoadjuvantly. She will need a port for this. I have discussed with her the risks and benefits of the this as well as some of the technical aspects and she understands and wishes to proceed. I will go ahead and refer her to plastic surgery since they are interested in this when the time is right. My recommendation after chemotherapy is for modified radical mastectomy based on the extent of disease. Current Plans Referred to Surgery - Plastic, for evaluation and follow up (Plastic Surgery). Routine.

## 2018-06-06 NOTE — Interval H&P Note (Signed)
History and Physical Interval Note:  06/06/2018 12:40 PM  Kim [name]  has presented today for surgery, with the diagnosis of RIGHT BREAST CANCER  The various methods of treatment have been discussed with the patient and family. After consideration of risks, benefits and other options for treatment, the patient has consented to  Procedure(s): INSERTION PORT-A-CATH POSSIBLE ULTRASOUND (N/A) as a surgical intervention .  The patient's history has been reviewed, patient examined, no change in status, stable for surgery.  I have reviewed the patient's chart and labs.  Questions were answered to the patient's satisfaction.     Autumn Messing III

## 2018-06-08 ENCOUNTER — Encounter: Payer: Self-pay | Admitting: Hematology

## 2018-06-09 ENCOUNTER — Other Ambulatory Visit: Payer: Self-pay | Admitting: Hematology

## 2018-06-09 ENCOUNTER — Encounter: Payer: Self-pay | Admitting: Hematology

## 2018-06-09 ENCOUNTER — Inpatient Hospital Stay: Payer: BC Managed Care – PPO

## 2018-06-09 ENCOUNTER — Encounter: Payer: Self-pay | Admitting: *Deleted

## 2018-06-09 ENCOUNTER — Inpatient Hospital Stay: Payer: BC Managed Care – PPO | Admitting: Hematology

## 2018-06-09 ENCOUNTER — Encounter (HOSPITAL_BASED_OUTPATIENT_CLINIC_OR_DEPARTMENT_OTHER): Payer: Self-pay | Admitting: General Surgery

## 2018-06-09 VITALS — BP 91/60 | HR 58 | Temp 98.2°F | Resp 18

## 2018-06-09 DIAGNOSIS — C50811 Malignant neoplasm of overlapping sites of right female breast: Secondary | ICD-10-CM

## 2018-06-09 DIAGNOSIS — Z170 Estrogen receptor positive status [ER+]: Principal | ICD-10-CM

## 2018-06-09 DIAGNOSIS — Z5112 Encounter for antineoplastic immunotherapy: Secondary | ICD-10-CM | POA: Diagnosis not present

## 2018-06-09 LAB — CBC WITH DIFFERENTIAL (CANCER CENTER ONLY)
Abs Immature Granulocytes: 0.01 10*3/uL (ref 0.00–0.07)
Basophils Absolute: 0.1 10*3/uL (ref 0.0–0.1)
Basophils Relative: 1 %
Eosinophils Absolute: 0 10*3/uL (ref 0.0–0.5)
Eosinophils Relative: 0 %
HCT: 38.4 % (ref 36.0–46.0)
Hemoglobin: 12.2 g/dL (ref 12.0–15.0)
Immature Granulocytes: 0 %
Lymphocytes Relative: 31 %
Lymphs Abs: 2 10*3/uL (ref 0.7–4.0)
MCH: 30.3 pg (ref 26.0–34.0)
MCHC: 31.8 g/dL (ref 30.0–36.0)
MCV: 95.5 fL (ref 80.0–100.0)
Monocytes Absolute: 0.5 10*3/uL (ref 0.1–1.0)
Monocytes Relative: 8 %
Neutro Abs: 4 10*3/uL (ref 1.7–7.7)
Neutrophils Relative %: 60 %
Platelet Count: 181 10*3/uL (ref 150–400)
RBC: 4.02 MIL/uL (ref 3.87–5.11)
RDW: 12.8 % (ref 11.5–15.5)
WBC Count: 6.6 10*3/uL (ref 4.0–10.5)
nRBC: 0 % (ref 0.0–0.2)

## 2018-06-09 LAB — CMP (CANCER CENTER ONLY)
ALT: 10 U/L (ref 0–44)
AST: 9 U/L — ABNORMAL LOW (ref 15–41)
Albumin: 4.4 g/dL (ref 3.5–5.0)
Alkaline Phosphatase: 38 U/L (ref 38–126)
Anion gap: 11 (ref 5–15)
BUN: 11 mg/dL (ref 6–20)
CO2: 25 mmol/L (ref 22–32)
Calcium: 9.6 mg/dL (ref 8.9–10.3)
Chloride: 101 mmol/L (ref 98–111)
Creatinine: 0.72 mg/dL (ref 0.44–1.00)
GFR, Est AFR Am: >60 mL/min (ref >60)
GFR, Estimated: >60 mL/min (ref >60)
Glucose, Bld: 71 mg/dL (ref 70–99)
Potassium: 4.2 mmol/L (ref 3.5–5.1)
Sodium: 137 mmol/L (ref 135–145)
Total Bilirubin: 1.1 mg/dL (ref 0.3–1.2)
Total Protein: 7 g/dL (ref 6.5–8.1)

## 2018-06-09 MED ORDER — TRASTUZUMAB CHEMO 150 MG IV SOLR
8.0000 mg/kg | Freq: Once | INTRAVENOUS | Status: AC
  Administered 2018-06-09: 504 mg via INTRAVENOUS
  Filled 2018-06-09: qty 24

## 2018-06-09 MED ORDER — HEPARIN SOD (PORK) LOCK FLUSH 100 UNIT/ML IV SOLN
500.0000 [IU] | Freq: Once | INTRAVENOUS | Status: AC | PRN
  Administered 2018-06-09: 500 [IU]
  Filled 2018-06-09: qty 5

## 2018-06-09 MED ORDER — SODIUM CHLORIDE 0.9 % IV SOLN
Freq: Once | INTRAVENOUS | Status: AC
  Administered 2018-06-09: 10:00:00 via INTRAVENOUS
  Filled 2018-06-09: qty 250

## 2018-06-09 MED ORDER — DIPHENHYDRAMINE HCL 25 MG PO CAPS
ORAL_CAPSULE | ORAL | Status: AC
  Filled 2018-06-09: qty 2

## 2018-06-09 MED ORDER — SODIUM CHLORIDE 0.9 % IV SOLN
75.0000 mg/m2 | Freq: Once | INTRAVENOUS | Status: AC
  Administered 2018-06-09: 130 mg via INTRAVENOUS
  Filled 2018-06-09: qty 13

## 2018-06-09 MED ORDER — SODIUM CHLORIDE 0.9 % IV SOLN
840.0000 mg | Freq: Once | INTRAVENOUS | Status: AC
  Administered 2018-06-09: 840 mg via INTRAVENOUS
  Filled 2018-06-09: qty 28

## 2018-06-09 MED ORDER — ACETAMINOPHEN 325 MG PO TABS
ORAL_TABLET | ORAL | Status: AC
  Filled 2018-06-09: qty 2

## 2018-06-09 MED ORDER — SODIUM CHLORIDE 0.9 % IV SOLN
Freq: Once | INTRAVENOUS | Status: AC
  Administered 2018-06-09: 15:00:00 via INTRAVENOUS
  Filled 2018-06-09: qty 5

## 2018-06-09 MED ORDER — PALONOSETRON HCL INJECTION 0.25 MG/5ML
INTRAVENOUS | Status: AC
  Filled 2018-06-09: qty 5

## 2018-06-09 MED ORDER — PALONOSETRON HCL INJECTION 0.25 MG/5ML
0.2500 mg | Freq: Once | INTRAVENOUS | Status: AC
  Administered 2018-06-09: 0.25 mg via INTRAVENOUS

## 2018-06-09 MED ORDER — DIPHENHYDRAMINE HCL 25 MG PO CAPS
50.0000 mg | ORAL_CAPSULE | Freq: Once | ORAL | Status: AC
  Administered 2018-06-09: 50 mg via ORAL

## 2018-06-09 MED ORDER — SODIUM CHLORIDE 0.9% FLUSH
10.0000 mL | INTRAVENOUS | Status: DC | PRN
  Administered 2018-06-09: 10 mL
  Filled 2018-06-09: qty 10

## 2018-06-09 MED ORDER — ACETAMINOPHEN 325 MG PO TABS
650.0000 mg | ORAL_TABLET | Freq: Once | ORAL | Status: AC
  Administered 2018-06-09: 650 mg via ORAL

## 2018-06-09 MED ORDER — SODIUM CHLORIDE 0.9 % IV SOLN
671.4000 mg | Freq: Once | INTRAVENOUS | Status: AC
  Administered 2018-06-09: 670 mg via INTRAVENOUS
  Filled 2018-06-09: qty 67

## 2018-06-09 NOTE — Progress Notes (Signed)
Per Beverly Milch, RN - pt had IUD removed last Thurs & started her period yesteday 06/08/18. Kennith Center, Pharm.D., CPP 06/09/2018@10 :30 AM

## 2018-06-09 NOTE — Progress Notes (Signed)
Went to infusion to introduce myself as Arboriculturist and to offer available resources.  Discussed the one-time $1000 Radio broadcast assistant. Gave her the income guideline for household size if interested/eligbile for applying.  Discussed the copay assistance program for Udenyca through [employer]. Patient gave me permission to enroll her in.  She has my card for any additional financial questions or concerns.

## 2018-06-09 NOTE — Addendum Note (Signed)
Addended by: [name] on: 06/09/2018 09:48 AM   Modules accepted: Orders

## 2018-06-09 NOTE — Patient Instructions (Signed)
[facility] Discharge Instructions for Patients Receiving Chemotherapy  Today you received the following chemotherapy agents: trastuzumab (Herceptin), pertuzumab (Perjeta), docetaxel (Taxotere), carboplatin (Paraplatin)  To help prevent nausea and vomiting after your treatment, we encourage you to take your nausea medication as prescribed by your physician.    If you develop nausea and vomiting that is not controlled by your nausea medication, call the clinic.   BELOW ARE SYMPTOMS THAT SHOULD BE REPORTED IMMEDIATELY:  *FEVER GREATER THAN 100.5 F  *CHILLS WITH OR WITHOUT FEVER  NAUSEA AND VOMITING THAT IS NOT CONTROLLED WITH YOUR NAUSEA MEDICATION  *UNUSUAL SHORTNESS OF BREATH  *UNUSUAL BRUISING OR BLEEDING  TENDERNESS IN MOUTH AND THROAT WITH OR WITHOUT PRESENCE OF ULCERS  *URINARY PROBLEMS  *BOWEL PROBLEMS  UNUSUAL RASH Items with * indicate a potential emergency and should be followed up as soon as possible.  Feel free to call the clinic should you have any questions or concerns. The clinic phone number is [phone].  Please show the [facility] at check-in to the Emergency Department and triage nurse.  Trastuzumab injection for infusion What is this medicine? TRASTUZUMAB (tras TOO zoo mab) is a monoclonal antibody. It is used to treat breast cancer and stomach cancer. This medicine may be used for other purposes; ask your health care provider or pharmacist if you have questions. COMMON BRAND NAME(S): Herceptin, Calla Kicks, OGIVRI What should I tell my health care provider before I take this medicine? They need to know if you have any of these conditions: -heart disease -heart failure -lung or breathing disease, like asthma -an unusual or allergic reaction to trastuzumab, benzyl alcohol, or other medications, foods, dyes, or preservatives -pregnant or trying to get pregnant -breast-feeding How should I use this medicine? This drug is given as an  infusion into a vein. It is administered in a hospital or clinic by a specially trained health care professional. Talk to your pediatrician regarding the use of this medicine in children. This medicine is not approved for use in children. Overdosage: If you think you have taken too much of this medicine contact a poison control center or emergency room at once. NOTE: This medicine is only for you. Do not share this medicine with others. What if I miss a dose? It is important not to miss a dose. Call your doctor or health care professional if you are unable to keep an appointment. What may interact with this medicine? This medicine may interact with the following medications: -certain types of chemotherapy, such as daunorubicin, doxorubicin, epirubicin, and idarubicin This list may not describe all possible interactions. Give your health care provider a list of all the medicines, herbs, non-prescription drugs, or dietary supplements you use. Also tell them if you smoke, drink alcohol, or use illegal drugs. Some items may interact with your medicine. What should I watch for while using this medicine? Visit your doctor for checks on your progress. Report any side effects. Continue your course of treatment even though you feel ill unless your doctor tells you to stop. Call your doctor or health care professional for advice if you get a fever, chills or sore throat, or other symptoms of a cold or flu. Do not treat yourself. Try to avoid being around people who are sick. You may experience fever, chills and shaking during your first infusion. These effects are usually mild and can be treated with other medicines. Report any side effects during the infusion to your health care professional. Fever and  chills usually do not happen with later infusions. Do not become pregnant while taking this medicine or for 7 months after stopping it. Women should inform their doctor if they wish to become pregnant or think  they might be pregnant. Women of child-bearing potential will need to have a negative pregnancy test before starting this medicine. There is a potential for serious side effects to an unborn child. Talk to your health care professional or pharmacist for more information. Do not breast-feed an infant while taking this medicine or for 7 months after stopping it. Women must use effective birth control with this medicine. What side effects may I notice from receiving this medicine? Side effects that you should report to your doctor or health care professional as soon as possible: -allergic reactions like skin rash, itching or hives, swelling of the face, lips, or tongue -chest pain or palpitations -cough -dizziness -feeling faint or lightheaded, falls -fever -general ill feeling or flu-like symptoms -signs of worsening heart failure like breathing problems; swelling in your legs and feet -unusually weak or tired Side effects that usually do not require medical attention (report to your doctor or health care professional if they continue or are bothersome): -bone pain -changes in taste -diarrhea -joint pain -nausea/vomiting -weight loss This list may not describe all possible side effects. Call your doctor for medical advice about side effects. You may report side effects to FDA at 1-800-FDA-1088. Where should I keep my medicine? This drug is given in a hospital or clinic and will not be stored at home. NOTE: This sheet is a summary. It may not cover all possible information. If you have questions about this medicine, talk to your doctor, pharmacist, or health care provider.  2019 Elsevier/Gold Standard (2016-03-27 14:37:52)  Pertuzumab injection What is this medicine? PERTUZUMAB (per TOOZ ue mab) is a monoclonal antibody. It is used to treat breast cancer. This medicine may be used for other purposes; ask your health care provider or pharmacist if you have questions. COMMON BRAND NAME(S):  PERJETA What should I tell my health care provider before I take this medicine? They need to know if you have any of these conditions: -heart disease -heart failure -high blood pressure -history of irregular heart beat -recent or ongoing radiation therapy -an unusual or allergic reaction to pertuzumab, other medicines, foods, dyes, or preservatives -pregnant or trying to get pregnant -breast-feeding How should I use this medicine? This medicine is for infusion into a vein. It is given by a health care professional in a hospital or clinic setting. Talk to your pediatrician regarding the use of this medicine in children. Special care may be needed. Overdosage: If you think you have taken too much of this medicine contact a poison control center or emergency room at once. NOTE: This medicine is only for you. Do not share this medicine with others. What if I miss a dose? It is important not to miss your dose. Call your doctor or health care professional if you are unable to keep an appointment. What may interact with this medicine? Interactions are not expected. Give your health care provider a list of all the medicines, herbs, non-prescription drugs, or dietary supplements you use. Also tell them if you smoke, drink alcohol, or use illegal drugs. Some items may interact with your medicine. This list may not describe all possible interactions. Give your health care provider a list of all the medicines, herbs, non-prescription drugs, or dietary supplements you use. Also tell them if you smoke,  drink alcohol, or use illegal drugs. Some items may interact with your medicine. What should I watch for while using this medicine? Your condition will be monitored carefully while you are receiving this medicine. Report any side effects. Continue your course of treatment even though you feel ill unless your doctor tells you to stop. Do not become pregnant while taking this medicine or for 7 months after  stopping it. Women should inform their doctor if they wish to become pregnant or think they might be pregnant. Women of child-bearing potential will need to have a negative pregnancy test before starting this medicine. There is a potential for serious side effects to an unborn child. Talk to your health care professional or pharmacist for more information. Do not breast-feed an infant while taking this medicine or for 7 months after stopping it. Women must use effective birth control with this medicine. Call your doctor or health care professional for advice if you get a fever, chills or sore throat, or other symptoms of a cold or flu. Do not treat yourself. Try to avoid being around people who are sick. You may experience fever, chills, and headache during the infusion. Report any side effects during the infusion to your health care professional. What side effects may I notice from receiving this medicine? Side effects that you should report to your doctor or health care professional as soon as possible: -breathing problems -chest pain or palpitations -dizziness -feeling faint or lightheaded -fever or chills -skin rash, itching or hives -sore throat -swelling of the face, lips, or tongue -swelling of the legs or ankles -unusually weak or tired Side effects that usually do not require medical attention (report to your doctor or health care professional if they continue or are bothersome): -diarrhea -hair loss -nausea, vomiting -tiredness This list may not describe all possible side effects. Call your doctor for medical advice about side effects. You may report side effects to FDA at 1-800-FDA-1088. Where should I keep my medicine? This drug is given in a hospital or clinic and will not be stored at home. NOTE: This sheet is a summary. It may not cover all possible information. If you have questions about this medicine, talk to your doctor, pharmacist, or health care provider.  2019  Elsevier/Gold Standard (2015-05-05 12:08:50)  Docetaxel injection What is this medicine? DOCETAXEL (doe se TAX el) is a chemotherapy drug. It targets fast dividing cells, like cancer cells, and causes these cells to die. This medicine is used to treat many types of cancers like breast cancer, certain stomach cancers, head and neck cancer, lung cancer, and prostate cancer. This medicine may be used for other purposes; ask your health care provider or pharmacist if you have questions. COMMON BRAND NAME(S): Docefrez, Taxotere What should I tell my health care provider before I take this medicine? They need to know if you have any of these conditions: -infection (especially a virus infection such as chickenpox, cold sores, or herpes) -liver disease -low blood counts, like low white cell, platelet, or red cell counts -an unusual or allergic reaction to docetaxel, polysorbate 80, other chemotherapy agents, other medicines, foods, dyes, or preservatives -pregnant or trying to get pregnant -breast-feeding How should I use this medicine? This drug is given as an infusion into a vein. It is administered in a hospital or clinic by a specially trained health care professional. Talk to your pediatrician regarding the use of this medicine in children. Special care may be needed. Overdosage: If you think you have  taken too much of this medicine contact a poison control center or emergency room at once. NOTE: This medicine is only for you. Do not share this medicine with others. What if I miss a dose? It is important not to miss your dose. Call your doctor or health care professional if you are unable to keep an appointment. What may interact with this medicine? -cyclosporine -erythromycin -ketoconazole -medicines to increase blood counts like filgrastim, pegfilgrastim, sargramostim -vaccines Talk to your doctor or health care professional before taking any of these  medicines: -acetaminophen -aspirin -ibuprofen -ketoprofen -naproxen This list may not describe all possible interactions. Give your health care provider a list of all the medicines, herbs, non-prescription drugs, or dietary supplements you use. Also tell them if you smoke, drink alcohol, or use illegal drugs. Some items may interact with your medicine. What should I watch for while using this medicine? Your condition will be monitored carefully while you are receiving this medicine. You will need important blood work done while you are taking this medicine. This drug may make you feel generally unwell. This is not uncommon, as chemotherapy can affect healthy cells as well as cancer cells. Report any side effects. Continue your course of treatment even though you feel ill unless your doctor tells you to stop. In some cases, you may be given additional medicines to help with side effects. Follow all directions for their use. Call your doctor or health care professional for advice if you get a fever, chills or sore throat, or other symptoms of a cold or flu. Do not treat yourself. This drug decreases your body's ability to fight infections. Try to avoid being around people who are sick. This medicine may increase your risk to bruise or bleed. Call your doctor or health care professional if you notice any unusual bleeding. This medicine may contain alcohol in the product. You may get drowsy or dizzy. Do not drive, use machinery, or do anything that needs mental alertness until you know how this medicine affects you. Do not stand or sit up quickly, especially if you are an older patient. This reduces the risk of dizzy or fainting spells. Avoid alcoholic drinks. Do not become pregnant while taking this medicine or for 6 months after stopping it. Women should inform their doctor if they wish to become pregnant or think they might be pregnant. Men should not father a child while taking this medicine and for 3  months after stopping it. There is a potential for serious side effects to an unborn child. Talk to your health care professional or pharmacist for more information. Do not breast-feed an infant while taking this medicine or for 2 weeks after stopping it. This may interfere with the ability to father a child. You should talk to your doctor or health care professional if you are concerned about your fertility. What side effects may I notice from receiving this medicine? Side effects that you should report to your doctor or health care professional as soon as possible: -allergic reactions like skin rash, itching or hives, swelling of the face, lips, or tongue -low blood counts - This drug may decrease the number of white blood cells, red blood cells and platelets. You may be at increased risk for infections and bleeding. -signs of infection - fever or chills, cough, sore throat, pain or difficulty passing urine -signs of decreased platelets or bleeding - bruising, pinpoint red spots on the skin, black, tarry stools, nosebleeds -signs of decreased red blood cells -  unusually weak or tired, fainting spells, lightheadedness -breathing problems -fast or irregular heartbeat -low blood pressure -mouth sores -nausea and vomiting -pain, swelling, redness or irritation at the injection site -pain, tingling, numbness in the hands or feet -swelling of the ankle, feet, hands -weight gain Side effects that usually do not require medical attention (report to your doctor or health care professional if they continue or are bothersome): -bone pain -complete hair loss including hair on your head, underarms, pubic hair, eyebrows, and eyelashes -diarrhea -excessive tearing -changes in the color of fingernails -loosening of the fingernails -nausea -muscle pain -red flush to skin -sweating -weak or tired This list may not describe all possible side effects. Call your doctor for medical advice about side  effects. You may report side effects to FDA at 1-800-FDA-1088. Where should I keep my medicine? This drug is given in a hospital or clinic and will not be stored at home. NOTE: This sheet is a summary. It may not cover all possible information. If you have questions about this medicine, talk to your doctor, pharmacist, or health care provider.  2019 Elsevier/Gold Standard (2017-04-29 12:07:21)  Carboplatin injection What is this medicine? CARBOPLATIN (KAR boe pla tin) is a chemotherapy drug. It targets fast dividing cells, like cancer cells, and causes these cells to die. This medicine is used to treat ovarian cancer and many other cancers. This medicine may be used for other purposes; ask your health care provider or pharmacist if you have questions. COMMON BRAND NAME(S): Paraplatin What should I tell my health care provider before I take this medicine? They need to know if you have any of these conditions: -blood disorders -hearing problems -kidney disease -recent or ongoing radiation therapy -an unusual or allergic reaction to carboplatin, cisplatin, other chemotherapy, other medicines, foods, dyes, or preservatives -pregnant or trying to get pregnant -breast-feeding How should I use this medicine? This drug is usually given as an infusion into a vein. It is administered in a hospital or clinic by a specially trained health care professional. Talk to your pediatrician regarding the use of this medicine in children. Special care may be needed. Overdosage: If you think you have taken too much of this medicine contact a poison control center or emergency room at once. NOTE: This medicine is only for you. Do not share this medicine with others. What if I miss a dose? It is important not to miss a dose. Call your doctor or health care professional if you are unable to keep an appointment. What may interact with this medicine? -medicines for seizures -medicines to increase blood counts  like filgrastim, pegfilgrastim, sargramostim -some antibiotics like amikacin, gentamicin, neomycin, streptomycin, tobramycin -vaccines Talk to your doctor or health care professional before taking any of these medicines: -acetaminophen -aspirin -ibuprofen -ketoprofen -naproxen This list may not describe all possible interactions. Give your health care provider a list of all the medicines, herbs, non-prescription drugs, or dietary supplements you use. Also tell them if you smoke, drink alcohol, or use illegal drugs. Some items may interact with your medicine. What should I watch for while using this medicine? Your condition will be monitored carefully while you are receiving this medicine. You will need important blood work done while you are taking this medicine. This drug may make you feel generally unwell. This is not uncommon, as chemotherapy can affect healthy cells as well as cancer cells. Report any side effects. Continue your course of treatment even though you feel ill unless your doctor tells you  to stop. In some cases, you may be given additional medicines to help with side effects. Follow all directions for their use. Call your doctor or health care professional for advice if you get a fever, chills or sore throat, or other symptoms of a cold or flu. Do not treat yourself. This drug decreases your body's ability to fight infections. Try to avoid being around people who are sick. This medicine may increase your risk to bruise or bleed. Call your doctor or health care professional if you notice any unusual bleeding. Be careful brushing and flossing your teeth or using a toothpick because you may get an infection or bleed more easily. If you have any dental work done, tell your dentist you are receiving this medicine. Avoid taking products that contain aspirin, acetaminophen, ibuprofen, naproxen, or ketoprofen unless instructed by your doctor. These medicines may hide a fever. Do not become  pregnant while taking this medicine. Women should inform their doctor if they wish to become pregnant or think they might be pregnant. There is a potential for serious side effects to an unborn child. Talk to your health care professional or pharmacist for more information. Do not breast-feed an infant while taking this medicine. What side effects may I notice from receiving this medicine? Side effects that you should report to your doctor or health care professional as soon as possible: -allergic reactions like skin rash, itching or hives, swelling of the face, lips, or tongue -signs of infection - fever or chills, cough, sore throat, pain or difficulty passing urine -signs of decreased platelets or bleeding - bruising, pinpoint red spots on the skin, black, tarry stools, nosebleeds -signs of decreased red blood cells - unusually weak or tired, fainting spells, lightheadedness -breathing problems -changes in hearing -changes in vision -chest pain -high blood pressure -low blood counts - This drug may decrease the number of white blood cells, red blood cells and platelets. You may be at increased risk for infections and bleeding. -nausea and vomiting -pain, swelling, redness or irritation at the injection site -pain, tingling, numbness in the hands or feet -problems with balance, talking, walking -trouble passing urine or change in the amount of urine Side effects that usually do not require medical attention (report to your doctor or health care professional if they continue or are bothersome): -hair loss -loss of appetite -metallic taste in the mouth or changes in taste This list may not describe all possible side effects. Call your doctor for medical advice about side effects. You may report side effects to FDA at 1-800-FDA-1088. Where should I keep my medicine? This drug is given in a hospital or clinic and will not be stored at home. NOTE: This sheet is a summary. It may not cover all  possible information. If you have questions about this medicine, talk to your doctor, pharmacist, or health care provider.  2019 Elsevier/Gold Standard (2007-07-08 14:38:05)

## 2018-06-10 ENCOUNTER — Encounter: Payer: Self-pay | Admitting: Hematology

## 2018-06-10 ENCOUNTER — Ambulatory Visit
Admit: 2018-06-10 | Discharge: 2018-06-10 | Disposition: A | Discharge status: Home / Self Care | Payer: BC Managed Care – PPO | Attending: Hematology | Admitting: Hematology

## 2018-06-10 ENCOUNTER — Encounter (HOSPITAL_BASED_OUTPATIENT_CLINIC_OR_DEPARTMENT_OTHER): Payer: Self-pay | Admitting: General Surgery

## 2018-06-10 ENCOUNTER — Telehealth: Payer: Self-pay

## 2018-06-10 DIAGNOSIS — Z170 Estrogen receptor positive status [ER+]: Principal | ICD-10-CM

## 2018-06-10 DIAGNOSIS — C50811 Malignant neoplasm of overlapping sites of right female breast: Secondary | ICD-10-CM

## 2018-06-10 MED ORDER — GADOBUTROL 1 MMOL/ML IV SOLN
6.0000 mL | Freq: Once | INTRAVENOUS | Status: AC | PRN
  Administered 2018-06-10: 6 mL via INTRAVENOUS

## 2018-06-10 NOTE — Telephone Encounter (Signed)
-----   Message from Teodoro Spray, RN sent at 06/09/2018 11:01 AM EST ----- Regarding: Dr. Arman Filter time chemo Dr. Burr Medico pt. First time chemo follow-up ([facility]). Thank you.

## 2018-06-10 NOTE — Anesthesia Postprocedure Evaluation (Signed)
Anesthesia Post Note  Patient: Kim [name]  Procedure(s) Performed: INSERTION PORT-A-CATH POSSIBLE ULTRASOUND (Left Chest)     Patient location during evaluation: PACU Anesthesia Type: General Level of consciousness: awake and alert Pain management: pain level controlled Vital Signs Assessment: post-procedure vital signs reviewed and stable Respiratory status: spontaneous breathing, nonlabored ventilation, respiratory function stable and patient connected to nasal cannula oxygen Cardiovascular status: blood pressure returned to baseline and stable Postop Assessment: no apparent nausea or vomiting Anesthetic complications: no    Last Vitals:  Vitals:   06/06/18 1430 06/06/18 1500  BP: 102/62 103/72  Pulse: (!) 54 (!) 57  Resp: 17 18  Temp:  36.6 C  SpO2: 100% 100%    Last Pain:  Vitals:   06/09/18 0859  TempSrc:   PainSc: 0-No pain                 Lavette Yankovich

## 2018-06-10 NOTE — Progress Notes (Signed)
Enrolled patient in copay assistance for Herceptin and Perjeta via [employer] online portal.  Patient approved for $25,000 for the calendar year for Herceptin which will only leave her with a $5 copay after insurance pays.  Patient approved for $25,000 for the calendar year for Perjeta which will only leave her with a $5 copay after insurance pays.  She will receive a copy of both letters in the mail for her records only.  Copy given to [facility] for billing/copay monitoring.

## 2018-06-10 NOTE — Telephone Encounter (Signed)
Left voice message for patient regarding chemotherapy follow up. First time [facility]), encouraged patient to call back.

## 2018-06-11 ENCOUNTER — Encounter: Payer: Self-pay | Admitting: Hematology

## 2018-06-11 ENCOUNTER — Inpatient Hospital Stay: Payer: BC Managed Care – PPO

## 2018-06-11 VITALS — BP 104/64 | Temp 97.9°F | Resp 18

## 2018-06-11 DIAGNOSIS — Z170 Estrogen receptor positive status [ER+]: Principal | ICD-10-CM

## 2018-06-11 DIAGNOSIS — Z5112 Encounter for antineoplastic immunotherapy: Secondary | ICD-10-CM | POA: Diagnosis not present

## 2018-06-11 DIAGNOSIS — C50811 Malignant neoplasm of overlapping sites of right female breast: Secondary | ICD-10-CM

## 2018-06-11 MED ORDER — PEGFILGRASTIM-CBQV 6 MG/0.6ML ~~LOC~~ SOSY
6.0000 mg | PREFILLED_SYRINGE | Freq: Once | SUBCUTANEOUS | Status: AC
  Administered 2018-06-11: 6 mg via SUBCUTANEOUS

## 2018-06-11 MED ORDER — PEGFILGRASTIM-CBQV 6 MG/0.6ML ~~LOC~~ SOSY
PREFILLED_SYRINGE | SUBCUTANEOUS | Status: AC
  Filled 2018-06-11: qty 0.6

## 2018-06-11 NOTE — Progress Notes (Signed)
Enrolled patient in [employer] for copay assistance with [employer] on 06/09/17.  Received approval letter via fax today. Patient approved for $15,000 maximum benefit over the next 12 months. Her copay will be $0 after insurance pays for each injection.  Copy given to [facility] for billing/copay monitoring.  Patient will receive a copy in the mail for her records only.

## 2018-06-11 NOTE — Patient Instructions (Signed)
Pegfilgrastim injection  What is this medicine?  PEGFILGRASTIM (PEG fil gra stim) is a long-acting granulocyte colony-stimulating factor that stimulates the growth of neutrophils, a type of white blood cell important in the body's fight against infection. It is used to reduce the incidence of fever and infection in patients with certain types of cancer who are receiving chemotherapy that affects the bone marrow, and to increase survival after being exposed to high doses of radiation.  This medicine may be used for other purposes; ask your health care provider or pharmacist if you have questions.  COMMON BRAND NAME(S): Fulphila, Neulasta, UDENYCA  What should I tell my health care provider before I take this medicine?  They need to know if you have any of these conditions:  -kidney disease  -latex allergy  -ongoing radiation therapy  -sickle cell disease  -skin reactions to acrylic adhesives (On-Body Injector only)  -an unusual or allergic reaction to pegfilgrastim, filgrastim, other medicines, foods, dyes, or preservatives  -pregnant or trying to get pregnant  -breast-feeding  How should I use this medicine?  This medicine is for injection under the skin. If you get this medicine at home, you will be taught how to prepare and give the pre-filled syringe or how to use the On-body Injector. Refer to the patient Instructions for Use for detailed instructions. Use exactly as directed. Tell your healthcare provider immediately if you suspect that the On-body Injector may not have performed as intended or if you suspect the use of the On-body Injector resulted in a missed or partial dose.  It is important that you put your used needles and syringes in a special sharps container. Do not put them in a trash can. If you do not have a sharps container, call your pharmacist or healthcare provider to get one.  Talk to your pediatrician regarding the use of this medicine in children. While this drug may be prescribed for  selected conditions, precautions do apply.  Overdosage: If you think you have taken too much of this medicine contact a poison control center or emergency room at once.  NOTE: This medicine is only for you. Do not share this medicine with others.  What if I miss a dose?  It is important not to miss your dose. Call your doctor or health care professional if you miss your dose. If you miss a dose due to an On-body Injector failure or leakage, a new dose should be administered as soon as possible using a single prefilled syringe for manual use.  What may interact with this medicine?  Interactions have not been studied.  Give your health care provider a list of all the medicines, herbs, non-prescription drugs, or dietary supplements you use. Also tell them if you smoke, drink alcohol, or use illegal drugs. Some items may interact with your medicine.  This list may not describe all possible interactions. Give your health care provider a list of all the medicines, herbs, non-prescription drugs, or dietary supplements you use. Also tell them if you smoke, drink alcohol, or use illegal drugs. Some items may interact with your medicine.  What should I watch for while using this medicine?  You may need blood work done while you are taking this medicine.  If you are going to need a MRI, CT scan, or other procedure, tell your doctor that you are using this medicine (On-Body Injector only).  What side effects may I notice from receiving this medicine?  Side effects that you should report to   your doctor or health care professional as soon as possible:  -allergic reactions like skin rash, itching or hives, swelling of the face, lips, or tongue  -back pain  -dizziness  -fever  -pain, redness, or irritation at site where injected  -pinpoint red spots on the skin  -red or dark-brown urine  -shortness of breath or breathing problems  -stomach or side pain, or pain at the shoulder  -swelling  -tiredness  -trouble passing urine or  change in the amount of urine  Side effects that usually do not require medical attention (report to your doctor or health care professional if they continue or are bothersome):  -bone pain  -muscle pain  This list may not describe all possible side effects. Call your doctor for medical advice about side effects. You may report side effects to FDA at 1-800-FDA-1088.  Where should I keep my medicine?  Keep out of the reach of children.  If you are using this medicine at home, you will be instructed on how to store it. Throw away any unused medicine after the expiration date on the label.  NOTE: This sheet is a summary. It may not cover all possible information. If you have questions about this medicine, talk to your doctor, pharmacist, or health care provider.   2019 Elsevier/Gold Standard (2017-07-08 16:57:08)

## 2018-06-12 NOTE — Progress Notes (Signed)
[place]   Telephone:([phone]) [phone] Fax:(336) [phone]   Clinic Follow up Note   Patient Care Team: [name], MD as PCP - General (Family Medicine) [name], MD as Consulting Physician (General Surgery) [name], MD as Consulting Physician (Hematology) [name], MD as Consulting Physician (Radiation Oncology) [name], MD as Consulting Physician (Obstetrics and Gynecology) [name], RN as Oncology Nurse Navigator [name], RN as Oncology Nurse Navigator 06/17/2018  CHIEF COMPLAINT: F/u multifocal right breast cancer   SUMMARY OF ONCOLOGIC HISTORY: Oncology History   Cancer Staging Cancer of overlapping sites of right female breast [facility]) Staging form: Breast, AJCC 8th Edition - Clinical stage from 05/28/2018: Stage IB (cT2, cN1, cM0, G3, ER+, PR+, HER2+) - Signed by [name], MD on 05/28/2018  Malignant neoplasm of lower-outer quadrant of right breast of female, estrogen receptor positive ([place]) Staging form: Breast, AJCC 8th Edition - Clinical stage from 05/28/2018: Stage IIB (cT2, cN1, cM0, G3, ER+, PR+, HER2-) - Unsigned       Cancer of overlapping sites of right female breast ([place])   05/14/2018 Mammogram    Right breast mammogram 05/14/18  IMPRESSION  There is a 1.0 x 0.5 x 1.2 cm lobular hypoechoic mass right breast 7 o'clock position 3 cm from nipple, a 0.8 x 0.6 x 1.2 cm lobular hypoechoic mass right breast 7 o'clock position 2 cm from the nipple, and a 0.9 x 0.5 x 0.8 cm lobular hypoechoic mass right breast 5 o'clock position 3 cm from nipple.  There is a 3.8 x 1.0 x 1.3 cm lobular hypoechoic mass right breast 5 o'clock position 2 cm from nipple.  Multiple thickened right axillary lymph nodes (approximately 15). Measuring up to 1.0 cm in thickness. Adenopathy corresponds with axillary palpable abnormality.  Indeterminate round and punctate calcifications upper-outer quadrant right breast.Indeterminate round and  punctate calcifications upper-outer quadrant right breast.    05/21/2018 Initial Biopsy    Diagnosis 05/21/18  1. Breast, right, needle core biopsy, axilla, 5 o'clock, ribbon clip - INVASIVE MAMMARY CARCINOMA. - MAMMARY CARCINOMA IN SITU. 2. Breast, right, needle core biopsy, 7 o'clock, coil clip - INVASIVE MAMMARY CARCINOMA. - MAMMARY CARCINOMA IN SITU. 3. Lymph node, needle/core biopsy, right axillary LN, hydromark - METASTATIC MAMMARY CARCINOMA.     05/21/2018 Receptors her2    1. PROGNOSTIC INDICATORS Results: The tumor cells are EQUIVOCAL for Her2 (2+);  HER2 **NEGATIVE** by FISH   Estrogen Receptor: 80%, POSITIVE, MODERATE STAINING INTENSITY Progesterone Receptor: 5%, POSITIVE, MODERATE-WEAK STAINING INTENSITY Proliferation Marker Ki67: 10%  3. PROGNOSTIC INDICATORS Results: The tumor cells are POSITIVE for Her2 (3+). Estrogen Receptor: 90%, POSITIVE, STRONG STAINING INTENSITY Progesterone Receptor: 50%, POSITIVE, MODERATE STAINING INTENSITY Proliferation Marker Ki67: 5%    05/26/2018 Initial Diagnosis    Malignant neoplasm of lower-inner quadrant of right breast of female, estrogen receptor positive ([place])    05/27/2018 Mammogram    Left breast mammogram 05/27/18  IMPRESSION: No mammographic evidence of LEFT breast malignancy.    05/28/2018 Cancer Staging    Staging form: Breast, AJCC 8th Edition - Clinical stage from 05/28/2018: Stage IB (cT2, cN1, cM0, G3, ER+, PR+, HER2+) - Signed by [name], MD on 05/28/2018    06/03/2018 Imaging    MR BREAST BILATERAL W WO CONTRAST INC CAD  IMPRESSION: 1. Large enhancing masses and non mass enhancement identified throughout the majority of the right breast. Additionally, there are smaller masses identified within the upper inner and lower inner right breast. Overall  findings are concerning for extensive malignancy throughout all 4 quadrants of the right breast. 2. Extensive bulky lymphadenopathy identified within the right axilla.  The matted appearance makes counting the number of abnormal lymph nodes difficult as they are immediately approximated to each other. 3. At least one abnormal appearing right internal mammary lymph node. 4. Nonenhancing lesion within the sternum with associated sternal marrow heterogeneity, raising the possibility of osseous metastatic disease within the sternum. 5. Two enhancing masses within the left breast as described above.      06/05/2018 PET scan    NM PET Image Initial (PI) Skull Base To Thigh  IMPRESSION: 1. Multiple borderline enlarged right axillary lymph nodes identified exhibiting mild to moderate increased uptake. Can not rule out nodal metastasis. Correlation with biopsy recommended. 2. Asymmetric increased uptake within the right breast corresponding to MRI abnormality. 3. Subcentimeter right retropectoral and right internal mammary lymph nodes are again identified and exhibit nonspecific, low level FDG uptake. 4. Noncalcified solid nodule within the lateral right lower lobe measures 9 mm without significant radiotracer uptake. 5. No hypermetabolic osseous lesions identified.      06/09/2018 -  Chemotherapy    Neoadjuvant chemo TCHP every 3 weeks     06/10/2018 Pathology Results    Surgical pathology  Diagnosis 1. Breast, left, needle core biopsy, LOQ, barbell - MILD FIBROCYSTIC CHANGES. 2. Breast, left, needle core biopsy, UIQ cylinder - MILD FIBROCYSTIC CHANGES.      CURRENT THERAPY  Neoadjuvant chemotherapy TCHP, started 06/09/2018  INTERVAL HISTORY: Kim [name] is a 43 y.o. female who is here for follow-up. Today, she is here alone and is doing well. She states first cycle of chemotherapy went well. She started feeling symptoms about 2 days after treatment but today she is feeling well. She experienced episodes of diarrhea but takes Imodium. Since last visit she has lost 6 lbs but her appetite has improved today. After her injection she experienced  frequent rhinorrhea and took Claritin.   Pertinent positives and negatives of review of systems are listed and detailed within the above HPI.  REVIEW OF SYSTEMS:   Constitutional: Denies fevers, chills, (+) weight loss Eyes: Denies blurriness of vision Ears, nose, mouth, throat, and face: Denies mucositis or sore throat Respiratory: Denies cough, dyspnea or wheezes Cardiovascular: Denies palpitation, chest discomfort or lower extremity swelling Gastrointestinal:  Denies nausea, heartburn or change in bowel habits Skin: Denies abnormal skin rashes Lymphatics: Denies new lymphadenopathy or easy bruising Neurological:Denies numbness, tingling or new weaknesses Behavioral/Psych: Mood is stable, no new changes  All other systems were reviewed with the patient and are negative.  MEDICAL HISTORY:  Past Medical History:  Diagnosis Date  . Acid reflux   . Acute bronchitis   . Anemia   . Asthma   . Migraine     SURGICAL HISTORY: Past Surgical History:  Procedure Laterality Date  . BREAST BIOPSY Right 05/23/2018   x3, malignant  . DILATION AND CURETTAGE OF UTERUS     2006 x1, 2009 x2  . LAPAROTOMY  2008  . PORTACATH PLACEMENT Left 06/06/2018   Procedure: INSERTION PORT-A-CATH POSSIBLE ULTRASOUND;  Surgeon: [name], MD;  Location: [facility];  Service: General;  Laterality: Left;    I have reviewed the social history and family history with the patient and they are unchanged from previous note.  ALLERGIES:  has No Known Allergies.  MEDICATIONS:  Current Outpatient Medications  Medication Sig Dispense Refill  . albuterol (PROVENTIL HFA;VENTOLIN HFA) 108 (90  BASE) MCG/ACT inhaler Inhale 2 puffs into the lungs every 6 (six) hours as needed for wheezing or shortness of breath.    . ALPRAZolam (XANAX) 0.5 MG tablet Take 0.5 mg by mouth daily as needed for anxiety. 3 to 5 times month    . calcium carbonate (TUMS - DOSED IN MG ELEMENTAL CALCIUM) 500 MG chewable  tablet Chew 1 tablet by mouth daily as needed. heartburn    . cetirizine (ZYRTEC) 10 MG tablet Take 10 mg by mouth daily as needed for allergies.    . fish oil-omega-3 fatty acids 1000 MG capsule Take 1 g by mouth daily.     . ondansetron (ZOFRAN) 8 MG tablet Take 1 tablet (8 mg total) by mouth every 8 (eight) hours as needed for nausea or vomiting. 20 tablet 0  . Probiotic Product (PROBIOTIC DAILY PO) Take 1 capsule by mouth daily.     . prochlorperazine (COMPAZINE) 10 MG tablet Take 1 tablet (10 mg total) by mouth every 6 (six) hours as needed for nausea or vomiting. 30 tablet 0  . propranolol (INDERAL) 10 MG tablet Take 5 mg by mouth daily as needed. Takes as needed for shaking hand less than 10 times per year    . dexamethasone (DECADRON) 4 MG tablet Take 1 tab twice daily, start the day before Taxotere. Take once the day after X3 days (Patient not taking: Reported on 06/17/2018) 30 tablet 1  . diphenoxylate-atropine (LOMOTIL) 2.5-0.025 MG tablet Take 1-2 tablets by mouth 4 (four) times daily as needed for diarrhea or loose stools. 40 tablet 0  . HYDROcodone-acetaminophen (NORCO/VICODIN) 5-325 MG tablet Take 1-2 tablets by mouth every 6 (six) hours as needed for moderate pain or severe pain. (Patient not taking: Reported on 06/17/2018) 10 tablet 0  . lidocaine-prilocaine (EMLA) cream Apply 1 application topically as needed. (Patient not taking: Reported on 06/17/2018) 30 g 0   No current facility-administered medications for this visit.     PHYSICAL EXAMINATION: ECOG PERFORMANCE STATUS: 1 - Symptomatic but completely ambulatory  Vitals:   06/17/18 1414  BP: 103/68  Pulse: 72  Resp: 18  Temp: 98.3 F (36.8 C)  SpO2: 100%   Filed Weights   06/17/18 1414  Weight: 133 lb 11.2 oz (60.6 kg)    GENERAL:alert, no distress and comfortable SKIN: skin color, texture, turgor are normal, no rashes or significant lesions EYES: normal, Conjunctiva are pink and non-injected, sclera  clear OROPHARYNX:no exudate, no erythema and lips, buccal mucosa, and tongue normal  NECK: supple, thyroid normal size, non-tender, without nodularity LYMPH:  no palpable lymphadenopathy in the cervical, axillary or inguinal LUNGS: clear to auscultation and percussion with normal breathing effort HEART: regular rate & rhythm and no murmurs and no lower extremity edema ABDOMEN:abdomen soft, non-tender and normal bowel sounds Musculoskeletal:no cyanosis of digits and no clubbing  NEURO: alert & oriented x 3 with fluent speech, no focal motor/sensory deficits BREAST: (+) right breast 4x3 cm palpable mass on lateral side, involve the UOQ and LOQ, right axillary node about 1.5-2cm  LABORATORY DATA:  I have reviewed the data as listed CBC Latest Ref Rng & Units 06/17/2018 06/09/2018 05/28/2018  WBC 4.0 - 10.5 K/uL 25.7(H) 6.6 4.1  Hemoglobin 12.0 - 15.0 g/dL 12.3 12.2 13.5  Hematocrit 36.0 - 46.0 % 37.3 38.4 40.8  Platelets 150 - 400 K/uL 203 181 203     CMP Latest Ref Rng & Units 06/17/2018 06/09/2018 05/28/2018  Glucose 70 - 99 mg/dL 107(H) 71 87  BUN 6 - 20 mg/dL '8 11 13  ' Creatinine 0.44 - 1.00 mg/dL 0.77 0.72 0.80  Sodium 135 - 145 mmol/L 137 137 141  Potassium 3.5 - 5.1 mmol/L 4.4 4.2 4.2  Chloride 98 - 111 mmol/L 103 101 105  CO2 22 - 32 mmol/L '26 25 28  ' Calcium 8.9 - 10.3 mg/dL 8.9 9.6 9.2  Total Protein 6.5 - 8.1 g/dL 7.0 7.0 7.6  Total Bilirubin 0.3 - 1.2 mg/dL <0.2(L) 1.1 0.7  Alkaline Phos 38 - 126 U/L 87 38 57  AST 15 - 41 U/L 9(L) 9(L) 8(L)  ALT 0 - 44 U/L '25 10 10   ' PATHOLOGY: 06/10/2018 Surgical pathology  Diagnosis 1. Breast, left, needle core biopsy, LOQ, barbell - MILD FIBROCYSTIC CHANGES. 2. Breast, left, needle core biopsy, UIQ cylinder - MILD FIBROCYSTIC CHANGES. Microscopic Comment 1. The results were reported to [facility] on 06/11/2018. Gillie Manners MD Pathologist, Electronic Signature (Case signed 06/11/2018) Specimen Gross and  Clinical Information Specimen Comment 1. Formalin time: 8:35 AM, extracted < 5 minutes; known right breast cancer; previous biopsy: right breast cancer 2. Left breast enhancement Specimen(s) Obtained: 1. Breast, left, needle core biopsy, LOQ, barbell 2. Breast, left, needle core biopsy, UIQ cylinder Specimen Clinical Information 1. Suspect malignancy or FCC/PASH Gross 1. The specimen is received in formalin, labeled with the patient's name and left breast lower outer, and consists of five cores of tan-yellow fibroadipose tissue, ranging from 1.0 x 0.5 x 0.2 cm to 1.9 x 0.3 x 0.2 cm. The specimen is entirely submitted in two cassettes. Time in formalin: 8:35 a.m. on 06/10/2018 1 of 2 FINAL for [name], Kim ([id number]) Gross(continued) Cold ischemic time: Less than 5 minutes 2. The specimen is received in formalin, labeled with the patient's name and left breast upper inner, and consists of six cores of tan-yellow fibroadipose tissue, ranging from 0.9 x 0.3 x 0.2 cm to 1.3 x 0.6 x 0.3 cm. The specimen is entirely submitted in two cassettes.    RADIOGRAPHIC STUDIES: I have personally reviewed the radiological images as listed and agreed with the findings in the report. No results found.     ASSESSMENT & PLAN:   Javanna Patin is a 43 y.o. 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 She hasa 3.8cmmass at 5:00 and1.2cm mass at7:00 position, plus 2 groups of calcification at 6-7 o'clock position which have not biopsied. [place] also showed 15 pabnormallymph nodes.  - There is discrepancy of her to test results between breast biopsy and axillary lymph node biopsy, we plan to repeat HER2 on her surgical sample.  - She has started first cycle neoadjuvant TCHP on 2/24 with Udenyca on  day 3. She tolerated first cycle of chemotherapy overall well. She had moderate fatigue, low appetite and diarrhea for 3 days, and is recovering very well -We discussed management of diarrhea, and I called in Lomotil also. -Developed acid reflux symptoms, I encouraged her to use over-the-counter Prilosec, especially the week of chemo -She will return in 2 weeks for cycle 2 chemo  -monitor echo every 3 months, she will see cardiology in May   2. Genetic Testing -Had a genetic testing at [facility] earlier this week -Results are pending   3. Asthma  -mild, continuealbuterol inhaler as needed  4. Diet and fasting  -Pt's husband found  some literature of fasting for cancer and chemo, pt may want to try fasting around chemo  -I encourage her to stay healthy and high protein diet during chemo, she eats a lot of vegetables and fish -f/u with dietician   5. diarrhea -secondary to chemo -She knows to use Imodium as needed, up to 8 tablets a day, I also called in Lomotil today -she knows to drink fluids adequately.  She does not appear to be dehydrated, no need IV fluids.   Plan  - Labs reviewed, continue with neoadjuvant chemotherapy, TCHP, next cycle in 2 weeks  - I prescribed Lomotil  -f/u in 2 weeks   No problem-specific Assessment & Plan notes found for this encounter.   No orders of the defined types were placed in this encounter.  All questions were answered. The patient knows to call the clinic with any problems, questions or concerns. No barriers to learning was detected. I spent 15 minutes counseling the patient face to face. The total time spent in the appointment was 20 minutes and more than 50% was on counseling and review of test results  I, Manson Allan am acting as scribe for Dr. [name].  I have reviewed the above documentation for accuracy and completeness, and I agree with the above.     [name], MD 06/17/2018

## 2018-06-17 ENCOUNTER — Inpatient Hospital Stay: Payer: BC Managed Care – PPO | Attending: Hematology

## 2018-06-17 ENCOUNTER — Inpatient Hospital Stay (HOSPITAL_BASED_OUTPATIENT_CLINIC_OR_DEPARTMENT_OTHER): Payer: BC Managed Care – PPO | Admitting: Hematology

## 2018-06-17 ENCOUNTER — Inpatient Hospital Stay: Payer: BC Managed Care – PPO

## 2018-06-17 ENCOUNTER — Encounter: Payer: Self-pay | Admitting: Hematology

## 2018-06-17 VITALS — BP 103/68 | HR 72 | Temp 98.3°F | Resp 18 | Ht 69.0 in | Wt 133.7 lb

## 2018-06-17 DIAGNOSIS — Z170 Estrogen receptor positive status [ER+]: Secondary | ICD-10-CM | POA: Insufficient documentation

## 2018-06-17 DIAGNOSIS — R197 Diarrhea, unspecified: Secondary | ICD-10-CM

## 2018-06-17 DIAGNOSIS — Z452 Encounter for adjustment and management of vascular access device: Secondary | ICD-10-CM | POA: Insufficient documentation

## 2018-06-17 DIAGNOSIS — J45909 Unspecified asthma, uncomplicated: Secondary | ICD-10-CM | POA: Diagnosis not present

## 2018-06-17 DIAGNOSIS — C50811 Malignant neoplasm of overlapping sites of right female breast: Secondary | ICD-10-CM

## 2018-06-17 DIAGNOSIS — Z5111 Encounter for antineoplastic chemotherapy: Secondary | ICD-10-CM | POA: Diagnosis present

## 2018-06-17 DIAGNOSIS — Z5189 Encounter for other specified aftercare: Secondary | ICD-10-CM | POA: Insufficient documentation

## 2018-06-17 DIAGNOSIS — Z95828 Presence of other vascular implants and grafts: Secondary | ICD-10-CM

## 2018-06-17 DIAGNOSIS — Z5112 Encounter for antineoplastic immunotherapy: Secondary | ICD-10-CM | POA: Insufficient documentation

## 2018-06-17 LAB — CMP (CANCER CENTER ONLY)
ALT: 25 U/L (ref 0–44)
AST: 9 U/L — ABNORMAL LOW (ref 15–41)
Albumin: 3.9 g/dL (ref 3.5–5.0)
Alkaline Phosphatase: 87 U/L (ref 38–126)
Anion gap: 8 (ref 5–15)
BUN: 8 mg/dL (ref 6–20)
CO2: 26 mmol/L (ref 22–32)
Calcium: 8.9 mg/dL (ref 8.9–10.3)
Chloride: 103 mmol/L (ref 98–111)
Creatinine: 0.77 mg/dL (ref 0.44–1.00)
GFR, Est AFR Am: >60 mL/min (ref >60)
GFR, Estimated: >60 mL/min (ref >60)
Glucose, Bld: 107 mg/dL — ABNORMAL HIGH (ref 70–99)
Potassium: 4.4 mmol/L (ref 3.5–5.1)
Sodium: 137 mmol/L (ref 135–145)
Total Bilirubin: <0.2 mg/dL — ABNORMAL LOW (ref 0.3–1.2)
Total Protein: 7 g/dL (ref 6.5–8.1)

## 2018-06-17 LAB — CBC WITH DIFFERENTIAL (CANCER CENTER ONLY)
Abs Immature Granulocytes: 2.75 10*3/uL — ABNORMAL HIGH (ref 0.00–0.07)
Basophils Absolute: 0 10*3/uL (ref 0.0–0.1)
Basophils Relative: 0 %
Eosinophils Absolute: 0 10*3/uL (ref 0.0–0.5)
Eosinophils Relative: 0 %
HCT: 37.3 % (ref 36.0–46.0)
Hemoglobin: 12.3 g/dL (ref 12.0–15.0)
Immature Granulocytes: 11 %
Lymphocytes Relative: 10 %
Lymphs Abs: 2.5 10*3/uL (ref 0.7–4.0)
MCH: 30.6 pg (ref 26.0–34.0)
MCHC: 33 g/dL (ref 30.0–36.0)
MCV: 92.8 fL (ref 80.0–100.0)
Monocytes Absolute: 2.8 10*3/uL — ABNORMAL HIGH (ref 0.1–1.0)
Monocytes Relative: 11 %
Neutro Abs: 17.6 10*3/uL — ABNORMAL HIGH (ref 1.7–7.7)
Neutrophils Relative %: 68 %
Platelet Count: 203 10*3/uL (ref 150–400)
RBC: 4.02 MIL/uL (ref 3.87–5.11)
RDW: 13.1 % (ref 11.5–15.5)
WBC Count: 25.7 10*3/uL — ABNORMAL HIGH (ref 4.0–10.5)
nRBC: 0 % (ref 0.0–0.2)

## 2018-06-17 MED ORDER — HEPARIN SOD (PORK) LOCK FLUSH 100 UNIT/ML IV SOLN
500.0000 [IU] | Freq: Once | INTRAVENOUS | Status: AC
  Administered 2018-06-17: 500 [IU] via INTRAVENOUS
  Filled 2018-06-17: qty 5

## 2018-06-17 MED ORDER — SODIUM CHLORIDE 0.9% FLUSH
10.0000 mL | INTRAVENOUS | Status: DC | PRN
  Administered 2018-06-17: 10 mL via INTRAVENOUS
  Filled 2018-06-17: qty 10

## 2018-06-17 MED ORDER — DIPHENOXYLATE-ATROPINE 2.5-0.025 MG PO TABS: 1.0000 | ORAL_TABLET | Freq: Four times a day (QID) | ORAL | 0 refills | Status: DC | PRN

## 2018-06-18 ENCOUNTER — Encounter: Payer: BC Managed Care – PPO | Admitting: Genetic Counselor

## 2018-06-18 ENCOUNTER — Other Ambulatory Visit: Payer: BC Managed Care – PPO

## 2018-06-18 ENCOUNTER — Ambulatory Visit: Payer: BC Managed Care – PPO

## 2018-06-18 ENCOUNTER — Telehealth: Payer: Self-pay | Admitting: Hematology

## 2018-06-18 NOTE — Telephone Encounter (Signed)
No los per 3/3. 

## 2018-06-23 ENCOUNTER — Inpatient Hospital Stay (HOSPITAL_BASED_OUTPATIENT_CLINIC_OR_DEPARTMENT_OTHER): Payer: BC Managed Care – PPO | Admitting: Genetic Counselor

## 2018-06-23 ENCOUNTER — Encounter: Payer: Self-pay | Admitting: Genetic Counselor

## 2018-06-23 DIAGNOSIS — Z8042 Family history of malignant neoplasm of prostate: Secondary | ICD-10-CM

## 2018-06-23 DIAGNOSIS — C50511 Malignant neoplasm of lower-outer quadrant of right female breast: Secondary | ICD-10-CM

## 2018-06-23 DIAGNOSIS — C50311 Malignant neoplasm of lower-inner quadrant of right female breast: Secondary | ICD-10-CM | POA: Diagnosis not present

## 2018-06-23 DIAGNOSIS — Z1509 Genetic susceptibility to other malignant neoplasm: Secondary | ICD-10-CM | POA: Diagnosis not present

## 2018-06-23 DIAGNOSIS — Z170 Estrogen receptor positive status [ER+]: Secondary | ICD-10-CM

## 2018-06-23 DIAGNOSIS — Z808 Family history of malignant neoplasm of other organs or systems: Secondary | ICD-10-CM

## 2018-06-23 DIAGNOSIS — Z803 Family history of malignant neoplasm of breast: Secondary | ICD-10-CM

## 2018-06-23 NOTE — Progress Notes (Addendum)
GENETIC TEST RESULTS   Patient Name: Kim [name] Patient Age: 43 y.o. Encounter Date: 06/23/2018  Referring Provider: [name], MD  Angelina Ok, MD  Linna Darner, MS, [facility]   Ms. Petion was seen in the [place] clinic on June 23, 2018 due to a personal and family history of cancer and concern regarding a hereditary predisposition to cancer in the family. Please refer to the prior Genetics clinic note for more information regarding Ms. Tuft's medical and family histories and our assessment at the time.   FAMILY HISTORY:  We obtained a detailed, 4-generation family history.  Significant diagnoses are listed below: Family History  Problem Relation Age of Onset  . Hyperlipidemia Father   . Cancer Sister 97       Astrocytoma and Meningioma  . Cancer Maternal Aunt        pitutory cancer  . Breast cancer Maternal Great-grandmother 60       MGFs mother  . Prostate cancer Maternal Grandfather 76  . Prostate cancer Maternal Uncle 67  . Skin cancer Maternal Grandmother   . Cancer - Other Other        pituitary, MGMs sister, died in her 97s  . Breast cancer Other        PGFs sister    The patient has three children, a set of 50 year old boy/girl twins and a 45 year old daughter who are cancer free.  She has one sister who had an astrocytoma and meningioma at age 53.  Both parents are living.  The patient's mother is 2 and does not have cancer.  She has one brother who had prostate cancer at 46.  Both maternal grandparents are deceased.  The grandfather had prostate cancer and his mother had breast cancer.  The grandmother had skin cancer and her sister had a pituitary gland cancer and she died in her 18's.  The patient's father has a brother and sister who are cancer free.  The paternal grandparents are deceased.  The grandfather had a sister with breast cancer in her 80's.  Patient's maternal ancestors are of Caucasian descent, and paternal ancestors are of Caucasian  descent. There is no reported Ashkenazi Jewish ancestry. There is no known consanguinity.  GENETIC TESTING:  Ms. Tessier underwent genetic testing at [facility].  She pursued the Multi-cancer gene panel.  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 There were no deleterious mutations in AIP, ALK, APC, ATM, AXIN2,BAP1,  BARD1, BLM, BMPR1A, BRCA1, BRCA2, BRIP1, CASR, CDC73, CDH1, CDK4, CDKN1B, CDKN1C, CDKN2A (p14ARF), CDKN2A (p16INK4a), CEBPA, CHEK2, CTNNA1, DICER1, DIS3L2, EGFR (c.2369C>T, p.Thr790Met variant only), EPCAM (Deletion/duplication testing only), FH, FLCN, GATA2, GPC3, GREM1 (Promoter region deletion/duplication testing only), HOXB13 (c.251G>A, p.Gly84Glu), HRAS, KIT, MAX, MEN1, MET, MITF (c.952G>A, p.Glu318Lys variant only), MLH1, MSH2, MSH3, MSH6, MUTYH, NBN, NF1, NF2, NTHL1, PDGFRA, PHOX2B, PMS2, POLD1, POLE, POT1, PRKAR1A, PTCH1, PTEN, RAD50, RAD51C, RAD51D, RB1, RECQL4, RET, RUNX1, SDHAF2, SDHA (sequence changes only), SDHB, SDHC, SDHD, SMAD4, SMARCA4, SMARCB1, SMARCE1, STK11, SUFU, TERC, TERT, TMEM127, TP53, TSC1, TSC2, VHL, WRN and WT1.  .  Genetic testing did detect three Variants of Unknown Significance - one in the BLM gene called c.c.2984A>T and two in the RET gene, one is called c.350C>A and the other is c.731C>T.  At this time, it is unknown if these variants are associated with increased cancer risk or if this is a  normal finding, but most variants such as these get reclassified to being inconsequential. They should not be used to make medical management decisions. With time, we suspect the lab will determine the significance of these variants, if any. If we do learn more about them, we will try to contact Ms. Behring to discuss it further. However, it is important to stay in touch with [place] periodically and keep the address and phone  number up to date.   Clinical condition The risk of breast cancer in women with a single pathogenic PALB2 variant is 33-58% by age 80, with higher risks among those with a greater number of relatives with breast cancer (PMID: 37169678, 93810175, 10258527, 78242353). One study found the risk of developing contralateral breast cancer is approximately 10% within five years after the initial diagnosis of breast cancer among individuals with a pathogenic variant in [place] (PMID: 61443154).   For both men and women, there is also an increased risk for pancreatic cancer, however, specific risk figures are not yet well established (PMID: 00867619, 50932671, 24580998). A recent study of 31 families with PALB2 variants found that the risk for Pancreatic cancer is approximately 2-3% (PMID: 33825053).  This same study, as well as others, suggests an increased risk of ovarian cancer (about 5%) (PMID: 97673419, 37902409,  73532992) and female breast cancer (about 1%) (PMID: 42683419, 62229798, 92119417, 40814481), although this evidence is limited and emerging.   Gene information PALB2 is a tumor-suppressor gene, meaning its function is to help control the rate of growth and cell division in the body. The protein product plays a critical role in homologous recombination repair (HRR) through its ability to recruit BRCA2 and RAD51 to DNA breaks (Uniprot: [email or web address]_HUMAN,[id number] [email or web address]. Accessed January 2017). If there is a pathogenic variant in this gene that prevents it from functioning normally, the risk of developing certain types of cancers may be increased.  Inheritance Hereditary predisposition to cancer due to pathogenic variants in the PALB2 gene has autosomal dominant inheritance. This means that an individual with a pathogenic variant has a 50% chance of passing the condition on to their offspring. Once a pathogenic mutation is detected in an individual, it is possible to identify  at-risk relatives who can pursue testing for this specific familial variant. Many cases are inherited from a parent, but some cases may occur spontaneously (i.e., an individual with a pathogenic variant who has parents who do not have it).   Individuals with a single pathogenic PALB2 variant are also carriers of autosomal recessive Fanconi anemia type N. Fanconi anemia is characterized by bone marrow failure with variable additional anomalies, which often include short stature, abnormal skin pigmentation, abnormal thumbs, malformations of the skeletal and central nervous systems, and developmental delay (PMID: 8563149, 70263785). Risk of leukemia and early onset solid tumors is significantly elevated with this disorder (PMID: 88502774, 12878676, 72094709). For there to be a risk of Fanconi anemia in offspring, both the patient and their partner would each have to carry a pathogenic variant in [place]; in this case, the risk to have an affected child is 25%.   Management The [place] (NCCN) has published screening and surveillance guidelines for women with a single pathogenic variant in [place] (NCCN. Genetic/Familial High-Risk Assessment: Breast and Ovarian. Version 1.2020):   Annual mammography with consideration of tomosynthesis beginning at age 73   Consider annual breast MRI with contrast starting at age 40, with modification as appropriate based on family history   Discuss prophylactic risk-reducing mastectomy  Referral to genetic counseling for a discussion regarding recurrence risks for both autosomal dominant and autosomal recessive PALB2-related conditions  NCCN cites insufficient evidence to warrant screening for ovarian cancer (NCCN. Genetic/Familial High-Risk Assessment: Breast and Ovarian. Version 1.2020), but in some families screening for pancreatic cancer starting at age 54 through [place] would be appropriate.  The [employer] of Gastroenterology  Clinical Guidelines recommend pancreatic cancer screening in PALB2 carriers be limited to those with a first- or second-degree relative affected with pancreatic cancer. Ideally, screening should be performed in experienced centers utilizing a multidisciplinary approach under research conditions. Recommended screening includes annual endoscopic ultrasound and/or MRI/MRCP of the pancreas starting at age 65 or 74 years younger than the earliest age of pancreatic cancer diagnosis in the family (PMID: 40981191). Overall cancer risk assessment incorporates additional factors including personal medical history, family history, and any available genetic information that may result in a personalized plan for cancer prevention and surveillance.   We recommended that Ms. Nicotra talk with her oncologist, Dr. [name], about pancreatic cancer screening.  FAMILY MEMBERS: Knowing if a PALB2 pathogenic variant is present is advantageous. At-risk relatives can be identified, enabling pursuit of a diagnostic evaluation. Further, the available information regarding hereditary cancer susceptibility genes is constantly evolving and more clinically relevant data regarding PALB2 are likely to become available in the near future. Awareness of this cancer predisposition encourages patients and their providers to inform at-risk family members, to diligently follow recommended screening protocols, and to be vigilant in maintaining close and regular contact with their local genetics clinic in anticipation of new information.  It is important that all of Ms. Bagely's relatives (both men and women) know of the presence of this gene mutation. Site-specific genetic testing can sort out who in the family is at risk and who is not.   Ms. Meder children have a 50% chance to have inherited this mutation. However, they are relatively young and this will not be of any consequence to them for several years. We do not test children because  there is no risk to them until they are adults. We recommend they have genetic counseling and testing by the time they are in their early 20's.    Ms. Fite sister, as well as her parents, have a 50% chance to have inherited this mutation. We recommend they have genetic testing for this same mutation, as identifying the presence of this mutation would allow her to also take advantage of risk-reducing measures. Ms. Borba sister is scheduled to come in at the end of the month and her parents came today to talk about testing.  SUPPORT AND RESOURCES: If Ms. Dines is interested in PALB2-specific information and support, there is a group called the PALB2 interest group which may be helpful. This group is focused on families with PALB2 mutations, and providing updated information as well as possible opportunities for research. Additionally, there are two other groups, Facing Our Risk (www.facingourrisk.com) and Bright Pink (www.brightpink.org) which provide opportunities to speak with other individuals from high-risk breast cancer families. To locate genetic counselors in other cities, visit the website of the [employer] (ArtistMovie.se) and Secretary/administrator for a Social worker by zip code.  We encouraged Ms. Jolly to remain in contact with [place] on an annual basis so we can update her personal and family histories, and let her know of advances in cancer genetics that may benefit the family. Our contact number was provided. Ms. Delatorre questions were answered to her satisfaction  today, and she knows she is welcome to call anytime with additional questions.   Loza Prell P. Florene Glen, [place], [facility] Certified Genetic Counselor [email or web address] phone: [phone]

## 2018-06-25 NOTE — Progress Notes (Signed)
[place]   Telephone:([phone] Fax:(336) [phone]   Clinic Follow up Note   Patient Care Team: [name], MD as PCP - General (Family Medicine) [name], MD as Consulting Physician (General Surgery) [name], MD as Consulting Physician (Hematology) [name], MD as Consulting Physician (Radiation Oncology) [name], MD as Consulting Physician (Obstetrics and Gynecology) [name], RN as Oncology Nurse Navigator [name], RN as Oncology Nurse Navigator  Date of Service:  06/30/2018  CHIEF COMPLAINT: F/u multifocal right breast cancer   SUMMARY OF ONCOLOGIC HISTORY: Oncology History   Cancer Staging Cancer of overlapping sites of right female breast [facility]) Staging form: Breast, AJCC 8th Edition - Clinical stage from 05/28/2018: Stage IB (cT2, cN1, cM0, G3, ER+, PR+, HER2+) - Signed by [name], MD on 05/28/2018  Malignant neoplasm of lower-outer quadrant of right breast of female, estrogen receptor positive ([place]) Staging form: Breast, AJCC 8th Edition - Clinical stage from 05/28/2018: Stage IIB (cT2, cN1, cM0, G3, ER+, PR+, HER2-) - Unsigned       Cancer of overlapping sites of right female breast ([place])   05/14/2018 Mammogram    Right breast mammogram 05/14/18  IMPRESSION  There is a 1.0 x 0.5 x 1.2 cm lobular hypoechoic mass right breast 7 o'clock position 3 cm from nipple, a 0.8 x 0.6 x 1.2 cm lobular hypoechoic mass right breast 7 o'clock position 2 cm from the nipple, and a 0.9 x 0.5 x 0.8 cm lobular hypoechoic mass right breast 5 o'clock position 3 cm from nipple.  There is a 3.8 x 1.0 x 1.3 cm lobular hypoechoic mass right breast 5 o'clock position 2 cm from nipple.  Multiple thickened right axillary lymph nodes (approximately 15). Measuring up to 1.0 cm in thickness. Adenopathy corresponds with axillary palpable abnormality.  Indeterminate round and punctate calcifications upper-outer quadrant right  breast.Indeterminate round and punctate calcifications upper-outer quadrant right breast.    05/21/2018 Initial Biopsy    Diagnosis 05/21/18  1. Breast, right, needle core biopsy, axilla, 5 o'clock, ribbon clip - INVASIVE MAMMARY CARCINOMA. - MAMMARY CARCINOMA IN SITU. 2. Breast, right, needle core biopsy, 7 o'clock, coil clip - INVASIVE MAMMARY CARCINOMA. - MAMMARY CARCINOMA IN SITU. 3. Lymph node, needle/core biopsy, right axillary LN, hydromark - METASTATIC MAMMARY CARCINOMA.     05/21/2018 Receptors her2    1. PROGNOSTIC INDICATORS Results: The tumor cells are EQUIVOCAL for Her2 (2+);  HER2 **NEGATIVE** by FISH   Estrogen Receptor: 80%, POSITIVE, MODERATE STAINING INTENSITY Progesterone Receptor: 5%, POSITIVE, MODERATE-WEAK STAINING INTENSITY Proliferation Marker Ki67: 10%  3. PROGNOSTIC INDICATORS Results: The tumor cells are POSITIVE for Her2 (3+). Estrogen Receptor: 90%, POSITIVE, STRONG STAINING INTENSITY Progesterone Receptor: 50%, POSITIVE, MODERATE STAINING INTENSITY Proliferation Marker Ki67: 5%    05/26/2018 Initial Diagnosis    Malignant neoplasm of lower-inner quadrant of right breast of female, estrogen receptor positive ([place])    05/27/2018 Mammogram    Left breast mammogram 05/27/18  IMPRESSION: No mammographic evidence of LEFT breast malignancy.    05/28/2018 Cancer Staging    Staging form: Breast, AJCC 8th Edition - Clinical stage from 05/28/2018: Stage IB (cT2, cN1, cM0, G3, ER+, PR+, HER2+) - Signed by [name], MD on 05/28/2018    06/03/2018 Imaging    MR BREAST BILATERAL W WO CONTRAST INC CAD  IMPRESSION: 1. Large enhancing masses and non mass enhancement identified throughout the majority of the right breast. Additionally, there are smaller masses identified within the upper inner and  lower inner right breast. Overall findings are concerning for extensive malignancy throughout all 4 quadrants of the right breast. 2. Extensive bulky lymphadenopathy  identified within the right axilla. The matted appearance makes counting the number of abnormal lymph nodes difficult as they are immediately approximated to each other. 3. At least one abnormal appearing right internal mammary lymph node. 4. Nonenhancing lesion within the sternum with associated sternal marrow heterogeneity, raising the possibility of osseous metastatic disease within the sternum. 5. Two enhancing masses within the left breast as described above.      06/05/2018 PET scan    NM PET Image Initial (PI) Skull Base To Thigh  IMPRESSION: 1. Multiple borderline enlarged right axillary lymph nodes identified exhibiting mild to moderate increased uptake. Can not rule out nodal metastasis. Correlation with biopsy recommended. 2. Asymmetric increased uptake within the right breast corresponding to MRI abnormality. 3. Subcentimeter right retropectoral and right internal mammary lymph nodes are again identified and exhibit nonspecific, low level FDG uptake. 4. Noncalcified solid nodule within the lateral right lower lobe measures 9 mm without significant radiotracer uptake. 5. No hypermetabolic osseous lesions identified.      06/09/2018 -  Chemotherapy    Neoadjuvant chemo TCHP every 3 weeks     06/10/2018 Pathology Results    Surgical pathology  Diagnosis 1. Breast, left, needle core biopsy, LOQ, barbell - MILD FIBROCYSTIC CHANGES. 2. Breast, left, needle core biopsy, UIQ cylinder - MILD FIBROCYSTIC CHANGES.     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 There were no deleterious mutations in AIP, ALK, APC, ATM, AXIN2,BAP1,  BARD1, BLM, BMPR1A, BRCA1, BRCA2, BRIP1, CASR, CDC73, CDH1, CDK4, CDKN1B, CDKN1C, CDKN2A (p14ARF), CDKN2A (p16INK4a), CEBPA, CHEK2, CTNNA1, DICER1, DIS3L2, EGFR (c.2369C>T,  p.Thr790Met variant only), EPCAM (Deletion/duplication testing only), FH, FLCN, GATA2, GPC3, GREM1 (Promoter region deletion/duplication testing only), HOXB13 (c.251G>A, p.Gly84Glu), HRAS, KIT, MAX, MEN1, MET, MITF (c.952G>A, p.Glu318Lys variant only), MLH1, MSH2, MSH3, MSH6, MUTYH, NBN, NF1, NF2, NTHL1, PDGFRA, PHOX2B, PMS2, POLD1, POLE, POT1, PRKAR1A, PTCH1, PTEN, RAD50, RAD51C, RAD51D, RB1, RECQL4, RET, RUNX1, SDHAF2, SDHA (sequence changes only), SDHB, SDHC, SDHD, SMAD4, SMARCA4, SMARCB1, SMARCE1, STK11, SUFU, TERC, TERT, TMEM127, TP53, TSC1, TSC2, VHL, WRN and WT1.  Marland Kitchen      CURRENT THERAPY:  Neoadjuvant chemotherapyTCHP, started 06/09/2018  INTERVAL HISTORY:  Kim [name] is here for a follow up of treatment. She presents to the clinic today by herself. She notes she is doing well and with no more symptoms from first dose TCHP. She saw Dr. Hiram Comber at [facility] 2 weeks ago who recommended she do the same treatment she is currently on. There is no clinical trails for her at this time.  She has been able to be more active. She was still able to gain weight on her fast. She notes her concern with asthma, seasonal allergies and risk with COVID-19.  She notes after her genetic testing was positive of PALB2 she found more family members with cancer. Her maternal side had several members with prostate or breast cancer. She notes since starting chemo she feels difference in her right LNs.    REVIEW OF SYSTEMS:   Constitutional: Denies fevers, chills (+) Weight gain  Eyes: Denies blurriness of vision Ears, nose, mouth, throat, and face: Denies mucositis or sore throat (+) seasonal allergies  Respiratory: Denies cough, dyspnea or wheezes Cardiovascular: Denies palpitation, chest discomfort  or lower extremity swelling Gastrointestinal:  Denies nausea, heartburn or change in bowel habits Skin: Denies abnormal skin rashes Lymphatics: Denies new lymphadenopathy or easy bruising Neurological:Denies numbness,  tingling or new weaknesses Behavioral/Psych: Mood is stable, no new changes  All other systems were reviewed with the patient and are negative.  MEDICAL HISTORY:  Past Medical History:  Diagnosis Date  . Acid reflux   . Acute bronchitis   . Anemia   . Asthma   . Family history of brain cancer   . Family history of breast cancer   . Family history of prostate cancer   . Migraine     SURGICAL HISTORY: Past Surgical History:  Procedure Laterality Date  . BREAST BIOPSY Right 05/23/2018   x3, malignant  . DILATION AND CURETTAGE OF UTERUS     2006 x1, 2009 x2  . LAPAROTOMY  2008  . PORTACATH PLACEMENT Left 06/06/2018   Procedure: INSERTION PORT-A-CATH POSSIBLE ULTRASOUND;  Surgeon: [name], MD;  Location: [place] [facility];  Service: General;  Laterality: Left;    I have reviewed the social history and family history with the patient and they are unchanged from previous note.  ALLERGIES:  has No Known Allergies.  MEDICATIONS:  Current Outpatient Medications  Medication Sig Dispense Refill  . albuterol (PROVENTIL HFA;VENTOLIN HFA) 108 (90 BASE) MCG/ACT inhaler Inhale 2 puffs into the lungs every 6 (six) hours as needed for wheezing or shortness of breath.    . ALPRAZolam (XANAX) 0.5 MG tablet Take 0.5 mg by mouth daily as needed for anxiety. 3 to 5 times month    . calcium carbonate (TUMS - DOSED IN MG ELEMENTAL CALCIUM) 500 MG chewable tablet Chew 1 tablet by mouth daily as needed. heartburn    . cetirizine (ZYRTEC) 10 MG tablet Take 10 mg by mouth daily as needed for allergies.    Marland Kitchen dexamethasone (DECADRON) 4 MG tablet Take 1 tab twice daily, start the day before Taxotere. Take once the day after X3 days 30 tablet 1  . diphenoxylate-atropine (LOMOTIL) 2.5-0.025 MG tablet Take 1-2 tablets by mouth 4 (four) times daily as needed for diarrhea or loose stools. 40 tablet 0  . fish oil-omega-3 fatty acids 1000 MG capsule Take 1 g by mouth daily.     Marland Kitchen  HYDROcodone-acetaminophen (NORCO/VICODIN) 5-325 MG tablet Take 1-2 tablets by mouth every 6 (six) hours as needed for moderate pain or severe pain. 10 tablet 0  . lidocaine-prilocaine (EMLA) cream Apply 1 application topically as needed. 30 g 0  . ondansetron (ZOFRAN) 8 MG tablet Take 1 tablet (8 mg total) by mouth every 8 (eight) hours as needed for nausea or vomiting. 20 tablet 0  . Probiotic Product (PROBIOTIC DAILY PO) Take 1 capsule by mouth daily.     . prochlorperazine (COMPAZINE) 10 MG tablet Take 1 tablet (10 mg total) by mouth every 6 (six) hours as needed for nausea or vomiting. 30 tablet 0  . propranolol (INDERAL) 10 MG tablet Take 5 mg by mouth daily as needed. Takes as needed for shaking hand less than 10 times per year     No current facility-administered medications for this visit.     PHYSICAL EXAMINATION: ECOG PERFORMANCE STATUS: 0 - Asymptomatic  Vitals:   06/30/18 1025  BP: 103/72  Pulse: 60  Resp: 17  Temp: 97.7 F (36.5 C)  SpO2: 100%   Filed Weights   06/30/18 1025  Weight: 138 lb 6.4 oz (62.8 kg)    GENERAL:alert,  no distress and comfortable SKIN: skin color, texture, turgor are normal, no rashes or significant lesions EYES: normal, Conjunctiva are pink and non-injected, sclera clear OROPHARYNX:no exudate, no erythema and lips, buccal mucosa, and tongue normal  NECK: supple, thyroid normal size, non-tender, without nodularity LYMPH:  no palpable lymphadenopathy in the cervical, axillary or inguinal LUNGS: clear to auscultation and percussion with normal breathing effort HEART: regular rate & rhythm and no murmurs and no lower extremity edema ABDOMEN:abdomen soft, non-tender and normal bowel sounds Musculoskeletal:no cyanosis of digits and no clubbing  NEURO: alert & oriented x 3 with fluent speech, no focal motor/sensory deficits Breast exam deferred today   LABORATORY DATA:  I have reviewed the data as listed CBC Latest Ref Rng & Units 06/30/2018  06/17/2018 06/09/2018  WBC 4.0 - 10.5 K/uL 3.9(L) 25.7(H) 6.6  Hemoglobin 12.0 - 15.0 g/dL 11.0(L) 12.3 12.2  Hematocrit 36.0 - 46.0 % 33.9(L) 37.3 38.4  Platelets 150 - 400 K/uL 230 203 181     CMP Latest Ref Rng & Units 06/30/2018 06/17/2018 06/09/2018  Glucose 70 - 99 mg/dL 85 107(H) 71  BUN 6 - 20 mg/dL '8 8 11  ' Creatinine 0.44 - 1.00 mg/dL 0.68 0.77 0.72  Sodium 135 - 145 mmol/L 141 137 137  Potassium 3.5 - 5.1 mmol/L 3.8 4.4 4.2  Chloride 98 - 111 mmol/L 106 103 101  CO2 22 - 32 mmol/L '25 26 25  ' Calcium 8.9 - 10.3 mg/dL 8.9 8.9 9.6  Total Protein 6.5 - 8.1 g/dL 6.6 7.0 7.0  Total Bilirubin 0.3 - 1.2 mg/dL 0.7 <0.2(L) 1.1  Alkaline Phos 38 - 126 U/L 53 87 38  AST 15 - 41 U/L 10(L) 9(L) 9(L)  ALT 0 - 44 U/L '22 25 10      ' RADIOGRAPHIC STUDIES: I have personally reviewed the radiological images as listed and agreed with the findings in the report. No results found.   ASSESSMENT & PLAN:  Anjelina Dung is a 43 y.o. 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 [place] also showed 15 abnormallymph nodes.  -There is discrepancy of her to test results between breast biopsy and axillary lymph node biopsy,we plan to repeat HER2 on her surgical sample.  -She was seen by Med Onc Dr. Hiram Comber at [facility] in 05/2018 for second opinion. They reocmmended she proceed with her current regimen.  -She has started first cycle neoadjuvant TCHP on 06/09/18 with Udenyca on day 3. She tolerated first cycle of chemotherapy overall well with moderate fatigue, Acid reflux, low appetite and moderate diarrhea.  -For her Acid reflux I recommend Prilosec once daily. She is agreeable.  -Will  monitor echo every 3 months, she will see cardiology in May  -Labs reviewed, CBC WNL except WBC at 3.9, and mild anemia, CMP WNL. Her Pre-albumin is still pending. Overall adequate to proceed with [facility] tomorrow and day 3 Neulasta.  -F/u in 3 weeks before cycle 3 chemo   2. Genetics: PALB2 mutation+ -Genetic testing from [facility] done 05/2018 was positive for PALB2 mutation. She has discovered a stronger maternal family history of prostate and breast cancer in her 2nd and 3rd degree relatives.  -We discussed the risk of future breast cancer from [facility] to mutation, which depends  on her family history. I think it is very reasonable to proceed with b/l breast mastectomy given her positive family history of cancers. She is agreeable and is considering reconstruction.  She has not determined where to have surgery and reconstruction. -We reviewed the risk for other cancers in carriers for PALB2 mutations, including female breast cancer, prostate, ovarian and pancreatic cancer, although these risks have not yet been quantified. While there is an increased risk for other cancers, there are no current guidelines for screening for these cancers, and at this time NCCN does not suggest a discussion of risk reducing salpingo-oophorectomy.    3. Asthma  -mild, continuealbuterol inhaler as needed -I reviewed she is higher risk for infection. I discussed infection precautions and advised her to avoid unnecessary public venues.  -If she develops fever or productive cough with phlegm she can contact our clinic.  -stable   4. Diet and fasting  -Pt's husband found some literature of fasting for cancer and chemo, pt may want to try fasting around chemo  -f/u with dietician -She is still fasting 72 hours around her chemo.  She did lose 6 pounds since her first cycle of chemo, but has been eating better and able to gain weight.   -Will monitor her pre-albumin, pending today   5. Diarrhea  -secondary to chemo -She knows  to use Imodium as needed, up to 8 tablets a day, I also called in Lomotil today -Resolved after first week of chemo.    Plan  -Labs reviewed and adequate to proceed with second cycle TCHP tomorrow  -Lab, flush, f/u and TCHP in 3 weeks    No problem-specific Assessment & Plan notes found for this encounter.   No orders of the defined types were placed in this encounter.  All questions were answered. The patient knows to call the clinic with any problems, questions or concerns. No barriers to learning was detected. I spent 25 minutes counseling the patient face to face. The total time spent in the appointment was 30 minutes and more than 50% was on counseling and review of test results     [name], MD 06/30/2018   I, Joslyn Devon, am acting as scribe for [name], MD.   I have reviewed the above documentation for accuracy and completeness, and I agree with the above.

## 2018-06-28 ENCOUNTER — Other Ambulatory Visit: Payer: Self-pay | Admitting: Hematology

## 2018-06-28 DIAGNOSIS — C50811 Malignant neoplasm of overlapping sites of right female breast: Secondary | ICD-10-CM

## 2018-06-28 DIAGNOSIS — Z170 Estrogen receptor positive status [ER+]: Principal | ICD-10-CM

## 2018-06-30 ENCOUNTER — Inpatient Hospital Stay: Payer: BC Managed Care – PPO

## 2018-06-30 ENCOUNTER — Inpatient Hospital Stay (HOSPITAL_BASED_OUTPATIENT_CLINIC_OR_DEPARTMENT_OTHER): Payer: BC Managed Care – PPO | Admitting: Hematology

## 2018-06-30 ENCOUNTER — Encounter: Payer: Self-pay | Admitting: Hematology

## 2018-06-30 ENCOUNTER — Telehealth: Payer: Self-pay | Admitting: Hematology

## 2018-06-30 ENCOUNTER — Other Ambulatory Visit: Payer: Self-pay

## 2018-06-30 VITALS — BP 103/72 | HR 60 | Temp 97.7°F | Resp 17 | Ht 69.0 in | Wt 138.4 lb

## 2018-06-30 DIAGNOSIS — Z170 Estrogen receptor positive status [ER+]: Principal | ICD-10-CM

## 2018-06-30 DIAGNOSIS — C50811 Malignant neoplasm of overlapping sites of right female breast: Secondary | ICD-10-CM | POA: Diagnosis not present

## 2018-06-30 DIAGNOSIS — R197 Diarrhea, unspecified: Secondary | ICD-10-CM | POA: Diagnosis not present

## 2018-06-30 DIAGNOSIS — Z95828 Presence of other vascular implants and grafts: Secondary | ICD-10-CM

## 2018-06-30 DIAGNOSIS — Z5112 Encounter for antineoplastic immunotherapy: Secondary | ICD-10-CM | POA: Diagnosis not present

## 2018-06-30 DIAGNOSIS — J45909 Unspecified asthma, uncomplicated: Secondary | ICD-10-CM

## 2018-06-30 LAB — CBC WITH DIFFERENTIAL (CANCER CENTER ONLY)
Abs Immature Granulocytes: 0.01 10*3/uL (ref 0.00–0.07)
Basophils Absolute: 0.1 10*3/uL (ref 0.0–0.1)
Basophils Relative: 1 %
Eosinophils Absolute: 0 10*3/uL (ref 0.0–0.5)
Eosinophils Relative: 1 %
HCT: 33.9 % — ABNORMAL LOW (ref 36.0–46.0)
Hemoglobin: 11 g/dL — ABNORMAL LOW (ref 12.0–15.0)
Immature Granulocytes: 0 %
Lymphocytes Relative: 24 %
Lymphs Abs: 0.9 10*3/uL (ref 0.7–4.0)
MCH: 30.6 pg (ref 26.0–34.0)
MCHC: 32.4 g/dL (ref 30.0–36.0)
MCV: 94.4 fL (ref 80.0–100.0)
Monocytes Absolute: 0.3 10*3/uL (ref 0.1–1.0)
Monocytes Relative: 8 %
Neutro Abs: 2.6 10*3/uL (ref 1.7–7.7)
Neutrophils Relative %: 66 %
Platelet Count: 230 10*3/uL (ref 150–400)
RBC: 3.59 MIL/uL — ABNORMAL LOW (ref 3.87–5.11)
RDW: 13.9 % (ref 11.5–15.5)
WBC Count: 3.9 10*3/uL — ABNORMAL LOW (ref 4.0–10.5)
nRBC: 0 % (ref 0.0–0.2)

## 2018-06-30 LAB — CMP (CANCER CENTER ONLY)
ALT: 22 U/L (ref 0–44)
AST: 10 U/L — ABNORMAL LOW (ref 15–41)
Albumin: 4 g/dL (ref 3.5–5.0)
Alkaline Phosphatase: 53 U/L (ref 38–126)
Anion gap: 10 (ref 5–15)
BUN: 8 mg/dL (ref 6–20)
CO2: 25 mmol/L (ref 22–32)
Calcium: 8.9 mg/dL (ref 8.9–10.3)
Chloride: 106 mmol/L (ref 98–111)
Creatinine: 0.68 mg/dL (ref 0.44–1.00)
GFR, Est AFR Am: >60 mL/min (ref >60)
GFR, Estimated: >60 mL/min (ref >60)
Glucose, Bld: 85 mg/dL (ref 70–99)
Potassium: 3.8 mmol/L (ref 3.5–5.1)
Sodium: 141 mmol/L (ref 135–145)
Total Bilirubin: 0.7 mg/dL (ref 0.3–1.2)
Total Protein: 6.6 g/dL (ref 6.5–8.1)

## 2018-06-30 LAB — PREALBUMIN: Prealbumin: 21.4 mg/dL (ref 18–38)

## 2018-06-30 MED ORDER — HEPARIN SOD (PORK) LOCK FLUSH 100 UNIT/ML IV SOLN
500.0000 [IU] | Freq: Once | INTRAVENOUS | Status: AC | PRN
  Administered 2018-06-30: 500 [IU]
  Filled 2018-06-30: qty 5

## 2018-06-30 MED ORDER — SODIUM CHLORIDE 0.9% FLUSH
10.0000 mL | INTRAVENOUS | Status: DC | PRN
  Administered 2018-06-30: 10 mL
  Filled 2018-06-30: qty 10

## 2018-06-30 NOTE — Telephone Encounter (Signed)
Scheduled appt per 3/16 los. ° °

## 2018-07-01 ENCOUNTER — Other Ambulatory Visit: Payer: Self-pay

## 2018-07-01 ENCOUNTER — Inpatient Hospital Stay: Payer: BC Managed Care – PPO

## 2018-07-01 ENCOUNTER — Encounter: Payer: Self-pay | Admitting: *Deleted

## 2018-07-01 VITALS — BP 98/46 | HR 53 | Temp 98.3°F | Resp 17 | Ht 69.0 in

## 2018-07-01 DIAGNOSIS — Z170 Estrogen receptor positive status [ER+]: Principal | ICD-10-CM

## 2018-07-01 DIAGNOSIS — C50811 Malignant neoplasm of overlapping sites of right female breast: Secondary | ICD-10-CM

## 2018-07-01 DIAGNOSIS — Z5112 Encounter for antineoplastic immunotherapy: Secondary | ICD-10-CM | POA: Diagnosis not present

## 2018-07-01 LAB — PREGNANCY, URINE: Preg Test, Ur: NEGATIVE

## 2018-07-01 MED ORDER — DIPHENHYDRAMINE HCL 25 MG PO CAPS
50.0000 mg | ORAL_CAPSULE | Freq: Once | ORAL | Status: AC
  Administered 2018-07-01: 50 mg via ORAL

## 2018-07-01 MED ORDER — SODIUM CHLORIDE 0.9 % IV SOLN
671.4000 mg | Freq: Once | INTRAVENOUS | Status: AC
  Administered 2018-07-01: 670 mg via INTRAVENOUS
  Filled 2018-07-01: qty 67

## 2018-07-01 MED ORDER — HEPARIN SOD (PORK) LOCK FLUSH 100 UNIT/ML IV SOLN
500.0000 [IU] | Freq: Once | INTRAVENOUS | Status: AC | PRN
  Administered 2018-07-01: 500 [IU]
  Filled 2018-07-01: qty 5

## 2018-07-01 MED ORDER — SODIUM CHLORIDE 0.9 % IV SOLN
Freq: Once | INTRAVENOUS | Status: AC
  Administered 2018-07-01: 09:00:00 via INTRAVENOUS
  Filled 2018-07-01: qty 250

## 2018-07-01 MED ORDER — SODIUM CHLORIDE 0.9% FLUSH
10.0000 mL | INTRAVENOUS | Status: DC | PRN
  Administered 2018-07-01: 10 mL
  Filled 2018-07-01: qty 10

## 2018-07-01 MED ORDER — SODIUM CHLORIDE 0.9 % IV SOLN
420.0000 mg | Freq: Once | INTRAVENOUS | Status: AC
  Administered 2018-07-01: 420 mg via INTRAVENOUS
  Filled 2018-07-01: qty 14

## 2018-07-01 MED ORDER — PALONOSETRON HCL INJECTION 0.25 MG/5ML
INTRAVENOUS | Status: AC
  Filled 2018-07-01: qty 5

## 2018-07-01 MED ORDER — PALONOSETRON HCL INJECTION 0.25 MG/5ML
0.2500 mg | Freq: Once | INTRAVENOUS | Status: AC
  Administered 2018-07-01: 0.25 mg via INTRAVENOUS

## 2018-07-01 MED ORDER — SODIUM CHLORIDE 0.9 % IV SOLN
Freq: Once | INTRAVENOUS | Status: AC
  Administered 2018-07-01: 09:00:00 via INTRAVENOUS
  Filled 2018-07-01: qty 5

## 2018-07-01 MED ORDER — ACETAMINOPHEN 325 MG PO TABS
ORAL_TABLET | ORAL | Status: AC
  Filled 2018-07-01: qty 2

## 2018-07-01 MED ORDER — DIPHENHYDRAMINE HCL 25 MG PO CAPS
ORAL_CAPSULE | ORAL | Status: AC
  Filled 2018-07-01: qty 2

## 2018-07-01 MED ORDER — ACETAMINOPHEN 325 MG PO TABS
650.0000 mg | ORAL_TABLET | Freq: Once | ORAL | Status: AC
  Administered 2018-07-01: 650 mg via ORAL

## 2018-07-01 MED ORDER — TRASTUZUMAB CHEMO 150 MG IV SOLR
6.0000 mg/kg | Freq: Once | INTRAVENOUS | Status: AC
  Administered 2018-07-01: 378 mg via INTRAVENOUS
  Filled 2018-07-01: qty 18

## 2018-07-01 MED ORDER — SODIUM CHLORIDE 0.9 % IV SOLN
75.0000 mg/m2 | Freq: Once | INTRAVENOUS | Status: AC
  Administered 2018-07-01: 130 mg via INTRAVENOUS
  Filled 2018-07-01: qty 13

## 2018-07-01 NOTE — Research (Signed)
DCP-001 Use of a Clinical Trial Screening Tool to Address Cancer Health Disparities in the [employer] Community Oncology Research Program  07/01/18 This RN met with patient Alena Wilensky to discuss the DCP-001 study.  This RN met with the patient briefly yesterday and provided the informed consent form and HIPAA authorization for the patient to review at home. Today, this RN again reviewed the purpose of the study, and that the project includes individuals regardless of their decision to participate in an NCI clinical trial.  The patient is aware that this is a collection of demographic variables, with the majority of the information collected from the medical record.  No patient identifiers are being reported via the screening tool.   The patient had already reviewed the consent form and HIPAA authorization.  She understands the study and denies any questions or concerns regarding participation.  She understands that participation is voluntary and she agrees to participate.  The consent and HIPAA authorization were signed by the patient.  Copies of these signed documents were provided to the patient.  Approximately 30 minutes total were spent with the patient discussing the study.  The patient completed the DCP-001 worksheet with this RN.  The patient was thanked for her willingness to participate.  Stacey Phelps, RN, BSN, CCRC 07/01/2018 12:09 PM  

## 2018-07-01 NOTE — Patient Instructions (Signed)
[facility] Discharge Instructions for Patients Receiving Chemotherapy  Today you received the following chemotherapy agents: Trastuzumab (Herceptin), Pertuzumab (Perjeta), Docetaxel (Taxotere), and Carboplatin (Paraplatin)  To help prevent nausea and vomiting after your treatment, we encourage you to take your nausea medication as directed. Received Aloxi during your treatment today-->Take your Compazine prescription (not your Zofran) for the next 3 days as needed.    If you develop nausea and vomiting that is not controlled by your nausea medication, call the clinic.   BELOW ARE SYMPTOMS THAT SHOULD BE REPORTED IMMEDIATELY:  *FEVER GREATER THAN 100.5 F  *CHILLS WITH OR WITHOUT FEVER  NAUSEA AND VOMITING THAT IS NOT CONTROLLED WITH YOUR NAUSEA MEDICATION  *UNUSUAL SHORTNESS OF BREATH  *UNUSUAL BRUISING OR BLEEDING  TENDERNESS IN MOUTH AND THROAT WITH OR WITHOUT PRESENCE OF ULCERS  *URINARY PROBLEMS  *BOWEL PROBLEMS  UNUSUAL RASH Items with * indicate a potential emergency and should be followed up as soon as possible.  Feel free to call the clinic should you have any questions or concerns. The clinic phone number is [phone].  Please show the [place] at check-in to the Emergency Department and triage nurse.

## 2018-07-03 ENCOUNTER — Other Ambulatory Visit: Payer: Self-pay

## 2018-07-03 ENCOUNTER — Inpatient Hospital Stay: Payer: BC Managed Care – PPO

## 2018-07-03 VITALS — BP 92/62 | HR 60 | Temp 98.3°F | Resp 18

## 2018-07-03 DIAGNOSIS — Z170 Estrogen receptor positive status [ER+]: Principal | ICD-10-CM

## 2018-07-03 DIAGNOSIS — Z5112 Encounter for antineoplastic immunotherapy: Secondary | ICD-10-CM | POA: Diagnosis not present

## 2018-07-03 DIAGNOSIS — C50811 Malignant neoplasm of overlapping sites of right female breast: Secondary | ICD-10-CM

## 2018-07-03 MED ORDER — PEGFILGRASTIM-CBQV 6 MG/0.6ML ~~LOC~~ SOSY
PREFILLED_SYRINGE | SUBCUTANEOUS | Status: AC
  Filled 2018-07-03: qty 0.6

## 2018-07-03 MED ORDER — PEGFILGRASTIM-CBQV 6 MG/0.6ML ~~LOC~~ SOSY
6.0000 mg | PREFILLED_SYRINGE | Freq: Once | SUBCUTANEOUS | Status: AC
  Administered 2018-07-03: 6 mg via SUBCUTANEOUS

## 2018-07-03 NOTE — Patient Instructions (Signed)
Pegfilgrastim injection  What is this medicine?  PEGFILGRASTIM (PEG fil gra stim) is a long-acting granulocyte colony-stimulating factor that stimulates the growth of neutrophils, a type of white blood cell important in the body's fight against infection. It is used to reduce the incidence of fever and infection in patients with certain types of cancer who are receiving chemotherapy that affects the bone marrow, and to increase survival after being exposed to high doses of radiation.  This medicine may be used for other purposes; ask your health care provider or pharmacist if you have questions.  COMMON BRAND NAME(S): Fulphila, Neulasta, UDENYCA  What should I tell my health care provider before I take this medicine?  They need to know if you have any of these conditions:  -kidney disease  -latex allergy  -ongoing radiation therapy  -sickle cell disease  -skin reactions to acrylic adhesives (On-Body Injector only)  -an unusual or allergic reaction to pegfilgrastim, filgrastim, other medicines, foods, dyes, or preservatives  -pregnant or trying to get pregnant  -breast-feeding  How should I use this medicine?  This medicine is for injection under the skin. If you get this medicine at home, you will be taught how to prepare and give the pre-filled syringe or how to use the On-body Injector. Refer to the patient Instructions for Use for detailed instructions. Use exactly as directed. Tell your healthcare provider immediately if you suspect that the On-body Injector may not have performed as intended or if you suspect the use of the On-body Injector resulted in a missed or partial dose.  It is important that you put your used needles and syringes in a special sharps container. Do not put them in a trash can. If you do not have a sharps container, call your pharmacist or healthcare provider to get one.  Talk to your pediatrician regarding the use of this medicine in children. While this drug may be prescribed for  selected conditions, precautions do apply.  Overdosage: If you think you have taken too much of this medicine contact a poison control center or emergency room at once.  NOTE: This medicine is only for you. Do not share this medicine with others.  What if I miss a dose?  It is important not to miss your dose. Call your doctor or health care professional if you miss your dose. If you miss a dose due to an On-body Injector failure or leakage, a new dose should be administered as soon as possible using a single prefilled syringe for manual use.  What may interact with this medicine?  Interactions have not been studied.  Give your health care provider a list of all the medicines, herbs, non-prescription drugs, or dietary supplements you use. Also tell them if you smoke, drink alcohol, or use illegal drugs. Some items may interact with your medicine.  This list may not describe all possible interactions. Give your health care provider a list of all the medicines, herbs, non-prescription drugs, or dietary supplements you use. Also tell them if you smoke, drink alcohol, or use illegal drugs. Some items may interact with your medicine.  What should I watch for while using this medicine?  You may need blood work done while you are taking this medicine.  If you are going to need a MRI, CT scan, or other procedure, tell your doctor that you are using this medicine (On-Body Injector only).  What side effects may I notice from receiving this medicine?  Side effects that you should report to   your doctor or health care professional as soon as possible:  -allergic reactions like skin rash, itching or hives, swelling of the face, lips, or tongue  -back pain  -dizziness  -fever  -pain, redness, or irritation at site where injected  -pinpoint red spots on the skin  -red or dark-brown urine  -shortness of breath or breathing problems  -stomach or side pain, or pain at the shoulder  -swelling  -tiredness  -trouble passing urine or  change in the amount of urine  Side effects that usually do not require medical attention (report to your doctor or health care professional if they continue or are bothersome):  -bone pain  -muscle pain  This list may not describe all possible side effects. Call your doctor for medical advice about side effects. You may report side effects to FDA at 1-800-FDA-1088.  Where should I keep my medicine?  Keep out of the reach of children.  If you are using this medicine at home, you will be instructed on how to store it. Throw away any unused medicine after the expiration date on the label.  NOTE: This sheet is a summary. It may not cover all possible information. If you have questions about this medicine, talk to your doctor, pharmacist, or health care provider.   2019 Elsevier/Gold Standard (2017-07-08 16:57:08)

## 2018-07-07 ENCOUNTER — Telehealth: Payer: Self-pay

## 2018-07-07 NOTE — Telephone Encounter (Signed)
Patient calls with complaint of allergy systems, watery eyes, sinus pressure, runny nose, denies fever.  Asking what she can take?  Taking Tylenol for sinus pressure.  [phone]

## 2018-07-07 NOTE — Telephone Encounter (Signed)
Spoke with patient regarding her symptoms, per Dr. Burr Medico she can take Claritin, Benadryl, Flonase.  She verbalized an understanding and will call us if develops fever.

## 2018-07-09 ENCOUNTER — Telehealth: Payer: Self-pay | Admitting: Hematology

## 2018-07-09 ENCOUNTER — Telehealth: Payer: Self-pay

## 2018-07-09 NOTE — Telephone Encounter (Signed)
Patient calls stating she has had a headache for 3 days, Tylenol and Ibuprofen take the edge off, wants to know if you have any suggestions of what to take, and is this concerning?  Still has sinus congestion, no fever.   [phone]  Message given to Dr. Burr Medico.

## 2018-07-09 NOTE — Telephone Encounter (Signed)
Patient called this morning and reports new onset headaches for the past 3 to 4 days, relieved by Tylenol or ibuprofen, no vision changes or other neurologic symptom.  Her last chemo was 1 week ago, she has not been taking Zofran since her chemo.  She has had allergy symptoms lately, with nasal congestion, sinus pressure, and cough with mild sputum production.  Most of the nasal discharge is clear, she denies any fever or chills.  She has been taking Benadryl, Flonase, and her sinus symptoms has improved some.  She has been taking Tylenol and ibuprofen for headaches.   I think her headaches is likely related to her sinus congestion, from allergy, and or chemotherapy.  I encouraged her to continue the current management, and update [place] before the weekend, to see if she needs antibiotics.  She voiced good understanding, and agrees with the plan.  [name]  07/09/2018

## 2018-07-11 ENCOUNTER — Telehealth: Payer: Self-pay

## 2018-07-11 NOTE — Telephone Encounter (Signed)
Patient calls with continue sinus/allergy problems asking if it is okay for her take Sudafed or something else for the cough.  Called patient back and informed her will be fine to take or Robitussin for cough.  Left voice message.

## 2018-07-18 NOTE — Progress Notes (Signed)
[place]   Telephone:([phone] Fax:(336) [phone]   Clinic Follow up Note   Patient Care Team: [name], MD as PCP - General (Family Medicine) [name], MD as Consulting Physician (General Surgery) [name], MD as Consulting Physician (Hematology) [name], MD as Consulting Physician (Radiation Oncology) [name], MD as Consulting Physician (Obstetrics and Gynecology) [name], RN as Oncology Nurse Navigator [name], RN as Oncology Nurse Navigator  Date of Service:  07/21/2018  CHIEF COMPLAINT: F/u multifocal right breast cancer  SUMMARY OF ONCOLOGIC HISTORY: Oncology History   Cancer Staging Cancer of overlapping sites of right female breast [facility]) Staging form: Breast, AJCC 8th Edition - Clinical stage from 05/28/2018: Stage IB (cT2, cN1, cM0, G3, ER+, PR+, HER2+) - Signed by [name], MD on 05/28/2018  Malignant neoplasm of lower-outer quadrant of right breast of female, estrogen receptor positive ([place]) Staging form: Breast, AJCC 8th Edition - Clinical stage from 05/28/2018: Stage IIB (cT2, cN1, cM0, G3, ER+, PR+, HER2-) - Unsigned       Cancer of overlapping sites of right female breast ([place])   05/14/2018 Mammogram    Right breast mammogram 05/14/18  IMPRESSION  There is a 1.0 x 0.5 x 1.2 cm lobular hypoechoic mass right breast 7 o'clock position 3 cm from nipple, a 0.8 x 0.6 x 1.2 cm lobular hypoechoic mass right breast 7 o'clock position 2 cm from the nipple, and a 0.9 x 0.5 x 0.8 cm lobular hypoechoic mass right breast 5 o'clock position 3 cm from nipple.  There is a 3.8 x 1.0 x 1.3 cm lobular hypoechoic mass right breast 5 o'clock position 2 cm from nipple.  Multiple thickened right axillary lymph nodes (approximately 15). Measuring up to 1.0 cm in thickness. Adenopathy corresponds with axillary palpable abnormality.  Indeterminate round and punctate calcifications upper-outer quadrant right  breast.Indeterminate round and punctate calcifications upper-outer quadrant right breast.    05/21/2018 Initial Biopsy    Diagnosis 05/21/18  1. Breast, right, needle core biopsy, axilla, 5 o'clock, ribbon clip - INVASIVE MAMMARY CARCINOMA. - MAMMARY CARCINOMA IN SITU. 2. Breast, right, needle core biopsy, 7 o'clock, coil clip - INVASIVE MAMMARY CARCINOMA. - MAMMARY CARCINOMA IN SITU. 3. Lymph node, needle/core biopsy, right axillary LN, hydromark - METASTATIC MAMMARY CARCINOMA.     05/21/2018 Receptors her2    1. PROGNOSTIC INDICATORS Results: The tumor cells are EQUIVOCAL for Her2 (2+);  HER2 **NEGATIVE** by FISH   Estrogen Receptor: 80%, POSITIVE, MODERATE STAINING INTENSITY Progesterone Receptor: 5%, POSITIVE, MODERATE-WEAK STAINING INTENSITY Proliferation Marker Ki67: 10%  3. PROGNOSTIC INDICATORS Results: The tumor cells are POSITIVE for Her2 (3+). Estrogen Receptor: 90%, POSITIVE, STRONG STAINING INTENSITY Progesterone Receptor: 50%, POSITIVE, MODERATE STAINING INTENSITY Proliferation Marker Ki67: 5%    05/26/2018 Initial Diagnosis    Malignant neoplasm of lower-inner quadrant of right breast of female, estrogen receptor positive ([place])    05/27/2018 Mammogram    Left breast mammogram 05/27/18  IMPRESSION: No mammographic evidence of LEFT breast malignancy.    05/28/2018 Cancer Staging    Staging form: Breast, AJCC 8th Edition - Clinical stage from 05/28/2018: Stage IB (cT2, cN1, cM0, G3, ER+, PR+, HER2+) - Signed by [name], MD on 05/28/2018    06/03/2018 Imaging    MR BREAST BILATERAL W WO CONTRAST INC CAD  IMPRESSION: 1. Large enhancing masses and non mass enhancement identified throughout the majority of the right breast. Additionally, there are smaller masses identified within the upper inner and lower  inner right breast. Overall findings are concerning for extensive malignancy throughout all 4 quadrants of the right breast. 2. Extensive bulky lymphadenopathy  identified within the right axilla. The matted appearance makes counting the number of abnormal lymph nodes difficult as they are immediately approximated to each other. 3. At least one abnormal appearing right internal mammary lymph node. 4. Nonenhancing lesion within the sternum with associated sternal marrow heterogeneity, raising the possibility of osseous metastatic disease within the sternum. 5. Two enhancing masses within the left breast as described above.      06/05/2018 PET scan    NM PET Image Initial (PI) Skull Base To Thigh  IMPRESSION: 1. Multiple borderline enlarged right axillary lymph nodes identified exhibiting mild to moderate increased uptake. Can not rule out nodal metastasis. Correlation with biopsy recommended. 2. Asymmetric increased uptake within the right breast corresponding to MRI abnormality. 3. Subcentimeter right retropectoral and right internal mammary lymph nodes are again identified and exhibit nonspecific, low level FDG uptake. 4. Noncalcified solid nodule within the lateral right lower lobe measures 9 mm without significant radiotracer uptake. 5. No hypermetabolic osseous lesions identified.      06/09/2018 -  Chemotherapy    Neoadjuvant chemo TCHP every 3 weeks     06/10/2018 Pathology Results    Surgical pathology  Diagnosis 1. Breast, left, needle core biopsy, LOQ, barbell - MILD FIBROCYSTIC CHANGES. 2. Breast, left, needle core biopsy, UIQ cylinder - MILD FIBROCYSTIC CHANGES.     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 There were no deleterious mutations in AIP, ALK, APC, ATM, AXIN2,BAP1,  BARD1, BLM, BMPR1A, BRCA1, BRCA2, BRIP1, CASR, CDC73, CDH1, CDK4, CDKN1B, CDKN1C, CDKN2A (p14ARF), CDKN2A (p16INK4a), CEBPA, CHEK2, CTNNA1, DICER1, DIS3L2, EGFR (c.2369C>T,  p.Thr790Met variant only), EPCAM (Deletion/duplication testing only), FH, FLCN, GATA2, GPC3, GREM1 (Promoter region deletion/duplication testing only), HOXB13 (c.251G>A, p.Gly84Glu), HRAS, KIT, MAX, MEN1, MET, MITF (c.952G>A, p.Glu318Lys variant only), MLH1, MSH2, MSH3, MSH6, MUTYH, NBN, NF1, NF2, NTHL1, PDGFRA, PHOX2B, PMS2, POLD1, POLE, POT1, PRKAR1A, PTCH1, PTEN, RAD50, RAD51C, RAD51D, RB1, RECQL4, RET, RUNX1, SDHAF2, SDHA (sequence changes only), SDHB, SDHC, SDHD, SMAD4, SMARCA4, SMARCB1, SMARCE1, STK11, SUFU, TERC, TERT, TMEM127, TP53, TSC1, TSC2, VHL, WRN and WT1.  Marland Kitchen      CURRENT THERAPY:  Neoadjuvant chemotherapyTCHP, started2/24/2020  INTERVAL HISTORY:  Kim [name] is here for a follow up of treatment. She presents to the clinic today by herself. She notes she is doing well. She notes minor nausea and mild constipation. She notes she had a significant episode of allergy symptoms soon after her Udenyca injection. She also had a cough which was from her nasal draining. She took benadryl and cough medication. She notes her cough has cleared up and symptoms's improving. She denies skin rash, but notes skin dryness and peeling of her hands, especially with hand washing.  She feels she is abel to recover well from treatment and has been able to remain active. She denies medication refill.  She has not taken her urine sample accidentally, but notes she will take it during chemo.    REVIEW OF SYSTEMS:   Constitutional: Denies fevers, chills or abnormal weight loss Eyes: Denies blurriness of vision Ears, nose, mouth, throat, and face: Denies mucositis or sore throat (+) improved Sinus congestion and drainage (+) allergy symptoms  Respiratory: Denies cough, dyspnea or wheezes Cardiovascular: Denies palpitation, chest discomfort or lower  extremity swelling Gastrointestinal:  Denies nausea, heartburn or change in bowel habits Skin: Denies abnormal skin rashes (+) dry skin of hands with  peeling.  Lymphatics: Denies new lymphadenopathy or easy bruising Neurological:Denies numbness, tingling or new weaknesses Behavioral/Psych: Mood is stable, no new changes  All other systems were reviewed with the patient and are negative.  MEDICAL HISTORY:  Past Medical History:  Diagnosis Date   Acid reflux    Acute bronchitis    Anemia    Asthma    Family history of brain cancer    Family history of breast cancer    Family history of prostate cancer    Migraine     SURGICAL HISTORY: Past Surgical History:  Procedure Laterality Date   BREAST BIOPSY Right 05/23/2018   x3, malignant   DILATION AND CURETTAGE OF UTERUS     2006 x1, 2009 x2   LAPAROTOMY  2008   PORTACATH PLACEMENT Left 06/06/2018   Procedure: INSERTION PORT-A-CATH POSSIBLE ULTRASOUND;  Surgeon: [name], MD;  Location: [facility];  Service: General;  Laterality: Left;    I have reviewed the social history and family history with the patient and they are unchanged from previous note.  ALLERGIES:  has No Known Allergies.  MEDICATIONS:  Current Outpatient Medications  Medication Sig Dispense Refill   albuterol (PROVENTIL HFA;VENTOLIN HFA) 108 (90 BASE) MCG/ACT inhaler Inhale 2 puffs into the lungs every 6 (six) hours as needed for wheezing or shortness of breath.     ALPRAZolam (XANAX) 0.5 MG tablet Take 0.5 mg by mouth daily as needed for anxiety. 3 to 5 times month     calcium carbonate (TUMS - DOSED IN MG ELEMENTAL CALCIUM) 500 MG chewable tablet Chew 1 tablet by mouth daily as needed. heartburn     cetirizine (ZYRTEC) 10 MG tablet Take 10 mg by mouth daily as needed for allergies.     dexamethasone (DECADRON) 4 MG tablet Take 1 tab twice daily, start the day before Taxotere. Take once the day after X3 days 30 tablet 1   diphenoxylate-atropine (LOMOTIL) 2.5-0.025 MG tablet Take 1-2 tablets by mouth 4 (four) times daily as needed for diarrhea or loose stools. 40 tablet 0     fish oil-omega-3 fatty acids 1000 MG capsule Take 1 g by mouth daily.      HYDROcodone-acetaminophen (NORCO/VICODIN) 5-325 MG tablet Take 1-2 tablets by mouth every 6 (six) hours as needed for moderate pain or severe pain. 10 tablet 0   lidocaine-prilocaine (EMLA) cream Apply 1 application topically as needed. 30 g 0   ondansetron (ZOFRAN) 8 MG tablet Take 1 tablet (8 mg total) by mouth every 8 (eight) hours as needed for nausea or vomiting. 20 tablet 0   Probiotic Product (PROBIOTIC DAILY PO) Take 1 capsule by mouth daily.      prochlorperazine (COMPAZINE) 10 MG tablet Take 1 tablet (10 mg total) by mouth every 6 (six) hours as needed for nausea or vomiting. 30 tablet 0   propranolol (INDERAL) 10 MG tablet Take 5 mg by mouth daily as needed. Takes as needed for shaking hand less than 10 times per year     No current facility-administered medications for this visit.    Facility-Administered Medications Ordered in Other Visits  Medication Dose Route Frequency Provider Last Rate Last Dose   CARBOplatin (PARAPLATIN) 670 mg in sodium chloride 0.9 % 250 mL chemo infusion  670 mg Intravenous Once [name], MD       DOCEtaxel (  TAXOTERE) 130 mg in sodium chloride 0.9 % 250 mL chemo infusion  75 mg/m2 (Treatment Plan Recorded) Intravenous Once [name], MD       heparin lock flush 100 unit/mL  500 Units Intracatheter Once PRN [name], MD       sodium chloride flush (NS) 0.9 % injection 10 mL  10 mL Intracatheter PRN [name], MD        PHYSICAL EXAMINATION: ECOG PERFORMANCE STATUS: 1 - Symptomatic but completely ambulatory  Vitals with BMI 07/21/2018  Height 5'9"  Weight 137 lbs 8 oz  BMI 70.9  Systolic 97  Diastolic 62  Pulse 54  Respirations 17    GENERAL:alert, no distress and comfortable SKIN: skin color, texture, turgor are normal, no rashes or significant lesions EYES: normal, Conjunctiva are pink and non-injected, sclera clear OROPHARYNX:no exudate, no erythema and  lips, buccal mucosa, and tongue normal  NECK: supple, thyroid normal size, non-tender, without nodularity LYMPH:  no palpable lymphadenopathy in the cervical, axillary or inguinal LUNGS: clear to auscultation and percussion with normal breathing effort HEART: regular rate & rhythm and no murmurs and no lower extremity edema ABDOMEN:abdomen soft, non-tender and normal bowel sounds Musculoskeletal:no cyanosis of digits and no clubbing  NEURO: alert & oriented x 3 with fluent speech, no focal motor/sensory deficits Breat exam not performed today   LABORATORY DATA:  I have reviewed the data as listed CBC Latest Ref Rng & Units 07/21/2018 06/30/2018 06/17/2018  WBC 4.0 - 10.5 K/uL 5.0 3.9(L) 25.7(H)  Hemoglobin 12.0 - 15.0 g/dL 10.0(L) 11.0(L) 12.3  Hematocrit 36.0 - 46.0 % 30.9(L) 33.9(L) 37.3  Platelets 150 - 400 K/uL 256 230 203     CMP Latest Ref Rng & Units 07/21/2018 06/30/2018 06/17/2018  Glucose 70 - 99 mg/dL 85 85 107(H)  BUN 6 - 20 mg/dL _0 Creatinine 0.44 - 1.00 mg/dL 0.75 0.68 0.77  Sodium 135 - 145 mmol/L 138 141 137  Potassium 3.5 - 5.1 mmol/L 4.2 3.8 4.4  Chloride 98 - 111 mmol/L 102 106 103  CO2 22 - 32 mmol/L _1 Calcium 8.9 - 10.3 mg/dL 9.7 8.9 8.9  Total Protein 6.5 - 8.1 g/dL 6.6 6.6 7.0  Total Bilirubin 0.3 - 1.2 mg/dL 0.6 0.7 <0.2(L)  Alkaline Phos 38 - 126 U/L 63 53 87  AST 15 - 41 U/L 9(L) 10(L) 9(L)  ALT 0 - 44 U/L _2 RADIOGRAPHIC STUDIES: I have personally reviewed the radiological images as listed and agreed with the findings in the report. No results found.   ASSESSMENT & PLAN:  Kim [name] is a 43 y.o. 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 [place] also showed 15 abnormallymph nodes. -There is discrepancy of her to test results between breast biopsy and axillary lymph node biopsy,we plan to repeat HER2 on her surgical sample. -She was seen by Med Onc Dr. Hiram Comber at [facility] in 05/2018 for second opinion. They recommended she proceed with her current neoadjuvant chemo regimen.  -Shehasstartedfirst cycleneoadjuvantTCHP on 06/09/18 with Udenyca on day 3. She tolerates well with mild nausea and mild constipation and dry skin of hands.  -I encouraged her to use gloves more to  reduce her need to wash hands.  -Labs reviewed, CBC and CMP WNL except hg at 10, prealbumin pending. Overall adequate to proceed with cycle 3 TCHP today.  -F/u in 3 weeks. I encouraged her to call us with any concerns.   2. Genetics: PALB2 mutation+ -Genetic testing from [facility] done 05/2018 was positive for PALB2 mutation. She has discovered a stronger maternal family history of prostate and breast cancer in her 2nd and 3rd degree relatives.  -We discussed the risk of future breast cancer from [facility] to mutation, which depends on her family history. I think it is very reasonable to proceed with b/l breast mastectomy given her positive family history of cancers. She is agreeable and is considering reconstruction.  She has not determined where to have surgery and reconstruction. -We previously reviewed the risk for other cancers in carriers for PALB2 mutations, including female breast cancer, prostate, ovarian and pancreatic cancer, although these risks have not yet been quantified. While there is an increased risk for other cancers, there are no current guidelines for screening for these cancers, and at this time NCCN does not suggest a discussion of risk reducing salpingo-oophorectomy.    3. Asthma  -mild, continuealbuterol inhaler as needed -I reviewed she is higher risk for infection. I discussed infection precautions and advised her  to avoid unnecessary public venues.  -If she develops fever or productive cough with phlegm she can contact our clinic.  -stable   4. Diet and fasting  -Pt's husband found some literature of fasting for cancer and chemo, pt may want to try fasting around chemo  -f/u with dietician -She is still fasting 72 hours around her chemo. She did lose 6 pounds with her first cycle of chemo, but has been eating better and able to gain weight.   -Will monitor her pre-albumin, pending today  5. Allergy Symptoms  -She had a recent significant episode of sinus congestion and drainage. Has improved recently.  -I encouraged her to use claritin, Flonase and continue use of inhaler as needed.  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   No orders of the defined types were placed in this encounter.  All questions were answered. The patient knows to call the clinic with any problems, questions or concerns. No barriers to learning was detected.     [name], MD 07/21/2018   I, Joslyn Devon, am acting as scribe for [name], MD.   I have reviewed the above documentation for accuracy and completeness, and I agree with the above.

## 2018-07-21 ENCOUNTER — Inpatient Hospital Stay (HOSPITAL_BASED_OUTPATIENT_CLINIC_OR_DEPARTMENT_OTHER): Payer: BC Managed Care – PPO | Admitting: Hematology

## 2018-07-21 ENCOUNTER — Encounter: Payer: Self-pay | Admitting: Hematology

## 2018-07-21 ENCOUNTER — Inpatient Hospital Stay: Payer: BC Managed Care – PPO

## 2018-07-21 ENCOUNTER — Other Ambulatory Visit: Payer: BC Managed Care – PPO

## 2018-07-21 ENCOUNTER — Ambulatory Visit: Payer: BC Managed Care – PPO | Admitting: Hematology

## 2018-07-21 ENCOUNTER — Inpatient Hospital Stay: Payer: BC Managed Care – PPO | Attending: Hematology

## 2018-07-21 ENCOUNTER — Other Ambulatory Visit: Payer: Self-pay

## 2018-07-21 VITALS — BP 97/62 | HR 54 | Temp 98.2°F | Resp 17 | Wt 137.5 lb

## 2018-07-21 DIAGNOSIS — C50811 Malignant neoplasm of overlapping sites of right female breast: Secondary | ICD-10-CM

## 2018-07-21 DIAGNOSIS — Z5112 Encounter for antineoplastic immunotherapy: Secondary | ICD-10-CM | POA: Diagnosis not present

## 2018-07-21 DIAGNOSIS — J45909 Unspecified asthma, uncomplicated: Secondary | ICD-10-CM | POA: Diagnosis not present

## 2018-07-21 DIAGNOSIS — D6481 Anemia due to antineoplastic chemotherapy: Secondary | ICD-10-CM

## 2018-07-21 DIAGNOSIS — Z170 Estrogen receptor positive status [ER+]: Secondary | ICD-10-CM

## 2018-07-21 DIAGNOSIS — Z95828 Presence of other vascular implants and grafts: Secondary | ICD-10-CM

## 2018-07-21 DIAGNOSIS — Z5111 Encounter for antineoplastic chemotherapy: Secondary | ICD-10-CM | POA: Diagnosis present

## 2018-07-21 DIAGNOSIS — Z5189 Encounter for other specified aftercare: Secondary | ICD-10-CM | POA: Diagnosis not present

## 2018-07-21 LAB — CMP (CANCER CENTER ONLY)
ALT: 20 U/L (ref 0–44)
AST: 9 U/L — ABNORMAL LOW (ref 15–41)
Albumin: 3.9 g/dL (ref 3.5–5.0)
Alkaline Phosphatase: 63 U/L (ref 38–126)
Anion gap: 10 (ref 5–15)
BUN: 12 mg/dL (ref 6–20)
CO2: 26 mmol/L (ref 22–32)
Calcium: 9.7 mg/dL (ref 8.9–10.3)
Chloride: 102 mmol/L (ref 98–111)
Creatinine: 0.75 mg/dL (ref 0.44–1.00)
GFR, Est AFR Am: >60 mL/min (ref >60)
GFR, Estimated: >60 mL/min (ref >60)
Glucose, Bld: 85 mg/dL (ref 70–99)
Potassium: 4.2 mmol/L (ref 3.5–5.1)
Sodium: 138 mmol/L (ref 135–145)
Total Bilirubin: 0.6 mg/dL (ref 0.3–1.2)
Total Protein: 6.6 g/dL (ref 6.5–8.1)

## 2018-07-21 LAB — CBC WITH DIFFERENTIAL (CANCER CENTER ONLY)
Abs Immature Granulocytes: 0.01 10*3/uL (ref 0.00–0.07)
Basophils Absolute: 0.1 10*3/uL (ref 0.0–0.1)
Basophils Relative: 1 %
Eosinophils Absolute: 0 10*3/uL (ref 0.0–0.5)
Eosinophils Relative: 0 %
HCT: 30.9 % — ABNORMAL LOW (ref 36.0–46.0)
Hemoglobin: 10 g/dL — ABNORMAL LOW (ref 12.0–15.0)
Immature Granulocytes: 0 %
Lymphocytes Relative: 27 %
Lymphs Abs: 1.3 10*3/uL (ref 0.7–4.0)
MCH: 30.8 pg (ref 26.0–34.0)
MCHC: 32.4 g/dL (ref 30.0–36.0)
MCV: 95.1 fL (ref 80.0–100.0)
Monocytes Absolute: 0.4 10*3/uL (ref 0.1–1.0)
Monocytes Relative: 8 %
Neutro Abs: 3.2 10*3/uL (ref 1.7–7.7)
Neutrophils Relative %: 64 %
Platelet Count: 256 10*3/uL (ref 150–400)
RBC: 3.25 MIL/uL — ABNORMAL LOW (ref 3.87–5.11)
RDW: 15.2 % (ref 11.5–15.5)
WBC Count: 5 10*3/uL (ref 4.0–10.5)
nRBC: 0 % (ref 0.0–0.2)

## 2018-07-21 LAB — PREGNANCY, URINE: Preg Test, Ur: NEGATIVE

## 2018-07-21 LAB — PREALBUMIN: Prealbumin: 17.6 mg/dL — ABNORMAL LOW (ref 18–38)

## 2018-07-21 MED ORDER — SODIUM CHLORIDE 0.9% FLUSH
10.0000 mL | INTRAVENOUS | Status: DC | PRN
  Administered 2018-07-21: 10 mL
  Filled 2018-07-21: qty 10

## 2018-07-21 MED ORDER — SODIUM CHLORIDE 0.9 % IV SOLN
420.0000 mg | Freq: Once | INTRAVENOUS | Status: AC
  Administered 2018-07-21: 420 mg via INTRAVENOUS
  Filled 2018-07-21: qty 14

## 2018-07-21 MED ORDER — ACETAMINOPHEN 325 MG PO TABS
650.0000 mg | ORAL_TABLET | Freq: Once | ORAL | Status: AC
  Administered 2018-07-21: 650 mg via ORAL

## 2018-07-21 MED ORDER — SODIUM CHLORIDE 0.9 % IV SOLN
Freq: Once | INTRAVENOUS | Status: AC
  Administered 2018-07-21: 09:00:00 via INTRAVENOUS
  Filled 2018-07-21: qty 250

## 2018-07-21 MED ORDER — SODIUM CHLORIDE 0.9 % IV SOLN
671.4000 mg | Freq: Once | INTRAVENOUS | Status: AC
  Administered 2018-07-21: 670 mg via INTRAVENOUS
  Filled 2018-07-21: qty 67

## 2018-07-21 MED ORDER — PALONOSETRON HCL INJECTION 0.25 MG/5ML
0.2500 mg | Freq: Once | INTRAVENOUS | Status: AC
  Administered 2018-07-21: 0.25 mg via INTRAVENOUS

## 2018-07-21 MED ORDER — DIPHENHYDRAMINE HCL 25 MG PO CAPS
ORAL_CAPSULE | ORAL | Status: AC
  Filled 2018-07-21: qty 2

## 2018-07-21 MED ORDER — DIPHENHYDRAMINE HCL 25 MG PO CAPS
50.0000 mg | ORAL_CAPSULE | Freq: Once | ORAL | Status: AC
  Administered 2018-07-21: 50 mg via ORAL

## 2018-07-21 MED ORDER — SODIUM CHLORIDE 0.9 % IV SOLN
Freq: Once | INTRAVENOUS | Status: AC
  Administered 2018-07-21: 10:00:00 via INTRAVENOUS
  Filled 2018-07-21: qty 5

## 2018-07-21 MED ORDER — TRASTUZUMAB CHEMO 150 MG IV SOLR
6.0000 mg/kg | Freq: Once | INTRAVENOUS | Status: AC
  Administered 2018-07-21: 378 mg via INTRAVENOUS
  Filled 2018-07-21: qty 18

## 2018-07-21 MED ORDER — ACETAMINOPHEN 325 MG PO TABS
ORAL_TABLET | ORAL | Status: AC
  Filled 2018-07-21: qty 2

## 2018-07-21 MED ORDER — SODIUM CHLORIDE 0.9% FLUSH
10.0000 mL | INTRAVENOUS | Status: DC | PRN
  Administered 2018-07-21: 08:00:00 10 mL
  Filled 2018-07-21: qty 10

## 2018-07-21 MED ORDER — PALONOSETRON HCL INJECTION 0.25 MG/5ML
INTRAVENOUS | Status: AC
  Filled 2018-07-21: qty 5

## 2018-07-21 MED ORDER — HEPARIN SOD (PORK) LOCK FLUSH 100 UNIT/ML IV SOLN
500.0000 [IU] | Freq: Once | INTRAVENOUS | Status: AC | PRN
  Administered 2018-07-21: 500 [IU]
  Filled 2018-07-21: qty 5

## 2018-07-21 MED ORDER — SODIUM CHLORIDE 0.9 % IV SOLN
75.0000 mg/m2 | Freq: Once | INTRAVENOUS | Status: AC
  Administered 2018-07-21: 130 mg via INTRAVENOUS
  Filled 2018-07-21: qty 13

## 2018-07-21 NOTE — Addendum Note (Signed)
Addended by: Domingo Madeira on: 07/21/2018 08:35 AM   Modules accepted: Miquel Dunn

## 2018-07-21 NOTE — Progress Notes (Signed)
Okay to start treatment and then send urine sample for pregnancy test per Dr. Burr Medico

## 2018-07-21 NOTE — Patient Instructions (Signed)
[place] Discharge Instructions for Patients Receiving Chemotherapy  Today you received the following chemotherapy agents: Herceptin, Perjeta, Taxotere, Carboplatin  To help prevent nausea and vomiting after your treatment, we encourage you to take your nausea medication as directed.   If you develop nausea and vomiting that is not controlled by your nausea medication, call the clinic.   BELOW ARE SYMPTOMS THAT SHOULD BE REPORTED IMMEDIATELY:  *FEVER GREATER THAN 100.5 F  *CHILLS WITH OR WITHOUT FEVER  NAUSEA AND VOMITING THAT IS NOT CONTROLLED WITH YOUR NAUSEA MEDICATION  *UNUSUAL SHORTNESS OF BREATH  *UNUSUAL BRUISING OR BLEEDING  TENDERNESS IN MOUTH AND THROAT WITH OR WITHOUT PRESENCE OF ULCERS  *URINARY PROBLEMS  *BOWEL PROBLEMS  UNUSUAL RASH Items with * indicate a potential emergency and should be followed up as soon as possible.  Feel free to call the clinic should you have any questions or concerns. The clinic phone number is [phone].  Please show the [place] at check-in to the Emergency Department and triage nurse.  Coronavirus (COVID-19) Are you at risk?  Are you at risk for the Coronavirus (COVID-19)?  To be considered HIGH RISK for Coronavirus (COVID-19), you have to meet the following criteria:  . Traveled to [place], [place], [place], [place] or [place]; or in the [place] to [place], [place], [place], or [place]; and have fever, cough, and shortness of breath within the last 2 weeks of travel OR . Been in close contact with a person diagnosed with COVID-19 within the last 2 weeks and have fever, cough, and shortness of breath . IF YOU DO NOT MEET THESE CRITERIA, YOU ARE CONSIDERED LOW RISK FOR COVID-19.  What to do if you are HIGH RISK for COVID-19?  Marland Kitchen If you are having a medical emergency, call 911. . Seek medical care right away. Before you go to a doctor's office, urgent care or emergency department, call  ahead and tell them about your recent travel, contact with someone diagnosed with COVID-19, and your symptoms. You should receive instructions from your physician's office regarding next steps of care.  . When you arrive at healthcare provider, tell the healthcare staff immediately you have returned from visiting [place], [place], [place], [place] or [place]; or traveled in the [place] to [place], [place], [place], or [place]; in the last two weeks or you have been in close contact with a person diagnosed with COVID-19 in the last 2 weeks.   . Tell the health care staff about your symptoms: fever, cough and shortness of breath. . After you have been seen by a medical provider, you will be either: o Tested for (COVID-19) and discharged home on quarantine except to seek medical care if symptoms worsen, and asked to  - Stay home and avoid contact with others until you get your results (4-5 days)  - Avoid travel on public transportation if possible (such as bus, train, or airplane) or o Sent to the Emergency Department by EMS for evaluation, COVID-19 testing, and possible admission depending on your condition and test results.  What to do if you are LOW RISK for COVID-19?  Reduce your risk of any infection by using the same precautions used for avoiding the common cold or flu:  Marland Kitchen Wash your hands often with soap and warm water for at least 20 seconds.  If soap and water are not readily available, use an alcohol-based hand sanitizer with at least 60% alcohol.  . If  coughing or sneezing, cover your mouth and nose by coughing or sneezing into the elbow areas of your shirt or coat, into a tissue or into your sleeve (not your hands). . Avoid shaking hands with others and consider head nods or verbal greetings only. . Avoid touching your eyes, nose, or mouth with unwashed hands.  . Avoid close contact with people who are sick. . Avoid places or events with large numbers of people in one location,  like concerts or sporting events. . Carefully consider travel plans you have or are making. . If you are planning any travel outside or inside the [place], visit the CDC's Travelers' Health webpage for the latest health notices. . If you have some symptoms but not all symptoms, continue to monitor at home and seek medical attention if your symptoms worsen. . If you are having a medical emergency, call 911.   [place] / e-Visit: [email or web address]         [facility]: [facility]: [phone]                   [facility]: [phone]

## 2018-07-22 ENCOUNTER — Telehealth: Payer: Self-pay | Admitting: Hematology

## 2018-07-22 ENCOUNTER — Ambulatory Visit: Payer: BC Managed Care – PPO

## 2018-07-22 NOTE — Telephone Encounter (Signed)
Called patient to inform her of her new appt date and times.  Patient aware of appts.

## 2018-07-22 NOTE — Telephone Encounter (Signed)
No los per 4/6. °

## 2018-07-23 ENCOUNTER — Other Ambulatory Visit: Payer: Self-pay

## 2018-07-23 ENCOUNTER — Inpatient Hospital Stay: Payer: BC Managed Care – PPO

## 2018-07-23 DIAGNOSIS — C50811 Malignant neoplasm of overlapping sites of right female breast: Secondary | ICD-10-CM

## 2018-07-23 DIAGNOSIS — Z170 Estrogen receptor positive status [ER+]: Principal | ICD-10-CM

## 2018-07-23 DIAGNOSIS — Z5112 Encounter for antineoplastic immunotherapy: Secondary | ICD-10-CM | POA: Diagnosis not present

## 2018-07-23 MED ORDER — PEGFILGRASTIM-CBQV 6 MG/0.6ML ~~LOC~~ SOSY
PREFILLED_SYRINGE | SUBCUTANEOUS | Status: AC
  Filled 2018-07-23: qty 0.6

## 2018-07-23 MED ORDER — PEGFILGRASTIM-CBQV 6 MG/0.6ML ~~LOC~~ SOSY
6.0000 mg | PREFILLED_SYRINGE | Freq: Once | SUBCUTANEOUS | Status: AC
  Administered 2018-07-23: 6 mg via SUBCUTANEOUS

## 2018-07-23 NOTE — Patient Instructions (Signed)
Pegfilgrastim injection  What is this medicine?  PEGFILGRASTIM (PEG fil gra stim) is a long-acting granulocyte colony-stimulating factor that stimulates the growth of neutrophils, a type of white blood cell important in the body's fight against infection. It is used to reduce the incidence of fever and infection in patients with certain types of cancer who are receiving chemotherapy that affects the bone marrow, and to increase survival after being exposed to high doses of radiation.  This medicine may be used for other purposes; ask your health care provider or pharmacist if you have questions.  COMMON BRAND NAME(S): Fulphila, Neulasta, UDENYCA  What should I tell my health care provider before I take this medicine?  They need to know if you have any of these conditions:  -kidney disease  -latex allergy  -ongoing radiation therapy  -sickle cell disease  -skin reactions to acrylic adhesives (On-Body Injector only)  -an unusual or allergic reaction to pegfilgrastim, filgrastim, other medicines, foods, dyes, or preservatives  -pregnant or trying to get pregnant  -breast-feeding  How should I use this medicine?  This medicine is for injection under the skin. If you get this medicine at home, you will be taught how to prepare and give the pre-filled syringe or how to use the On-body Injector. Refer to the patient Instructions for Use for detailed instructions. Use exactly as directed. Tell your healthcare provider immediately if you suspect that the On-body Injector may not have performed as intended or if you suspect the use of the On-body Injector resulted in a missed or partial dose.  It is important that you put your used needles and syringes in a special sharps container. Do not put them in a trash can. If you do not have a sharps container, call your pharmacist or healthcare provider to get one.  Talk to your pediatrician regarding the use of this medicine in children. While this drug may be prescribed for  selected conditions, precautions do apply.  Overdosage: If you think you have taken too much of this medicine contact a poison control center or emergency room at once.  NOTE: This medicine is only for you. Do not share this medicine with others.  What if I miss a dose?  It is important not to miss your dose. Call your doctor or health care professional if you miss your dose. If you miss a dose due to an On-body Injector failure or leakage, a new dose should be administered as soon as possible using a single prefilled syringe for manual use.  What may interact with this medicine?  Interactions have not been studied.  Give your health care provider a list of all the medicines, herbs, non-prescription drugs, or dietary supplements you use. Also tell them if you smoke, drink alcohol, or use illegal drugs. Some items may interact with your medicine.  This list may not describe all possible interactions. Give your health care provider a list of all the medicines, herbs, non-prescription drugs, or dietary supplements you use. Also tell them if you smoke, drink alcohol, or use illegal drugs. Some items may interact with your medicine.  What should I watch for while using this medicine?  You may need blood work done while you are taking this medicine.  If you are going to need a MRI, CT scan, or other procedure, tell your doctor that you are using this medicine (On-Body Injector only).  What side effects may I notice from receiving this medicine?  Side effects that you should report to   your doctor or health care professional as soon as possible:  -allergic reactions like skin rash, itching or hives, swelling of the face, lips, or tongue  -back pain  -dizziness  -fever  -pain, redness, or irritation at site where injected  -pinpoint red spots on the skin  -red or dark-brown urine  -shortness of breath or breathing problems  -stomach or side pain, or pain at the shoulder  -swelling  -tiredness  -trouble passing urine or  change in the amount of urine  Side effects that usually do not require medical attention (report to your doctor or health care professional if they continue or are bothersome):  -bone pain  -muscle pain  This list may not describe all possible side effects. Call your doctor for medical advice about side effects. You may report side effects to FDA at 1-800-FDA-1088.  Where should I keep my medicine?  Keep out of the reach of children.  If you are using this medicine at home, you will be instructed on how to store it. Throw away any unused medicine after the expiration date on the label.  NOTE: This sheet is a summary. It may not cover all possible information. If you have questions about this medicine, talk to your doctor, pharmacist, or health care provider.   2019 Elsevier/Gold Standard (2017-07-08 16:57:08)

## 2018-08-07 ENCOUNTER — Telehealth: Payer: Self-pay | Admitting: *Deleted

## 2018-08-07 NOTE — Telephone Encounter (Signed)
Neulasta On Pro Outreach Note  Patient was contacted on                   In regards to switching G-CSF therapy to Neulasta Onpro ([email or web address]).  Patient was educated on the proposed change in therapy dur to COVID-19 pandemic.  Patient was educated about Neulasta On Pro on-body injector and patient will be prNovided with an educational video while in infusion on their next scheduled date if changed to [place]   Patient agrees to change in therapy.  Begin process to change to Neulasta Onpro therapy  Patient does not agree to change in therapy.  No change to Neulasta  Onpro at this time

## 2018-08-07 NOTE — Telephone Encounter (Signed)
Neulasta Onpro Patient Outreach Note  Patient was contacted on 08/06/2018  in regards to switching G-CSF therapy to Neulasta Onpro ([email or web address]). Patient was educated on the purpose of this proposed change in therapy due to COVID-19 pandemic. Patient was educated about Neulasta Onpro on-body injector and patient will be provided with an educational video while in infusion on their next scheduled date if changed to Neulasta Onpro.   [x]  Patient agrees to change in therapy. Begin process to change to Neulasta Onpro therapy.  []  Patient does not agree to change in therapy. No change to Neulasta Onpro at this time.    Thank You,  Caprice Renshaw  08/07/2018 1:39 PM

## 2018-08-08 NOTE — Progress Notes (Addendum)
[place]   [phone] [phone]   Clinic Follow up Note   Patient Care Team: [name], MD as PCP - General (Family Medicine) [name], MD as Consulting Physician (General Surgery) [name], MD as Consulting Physician (Hematology) [name], MD as Consulting Physician (Radiation Oncology) [name], MD as Consulting Physician (Obstetrics and Gynecology) [name], RN as Oncology Nurse Navigator [name], RN as Oncology Nurse Navigator  Date of Service:  08/11/2018  CHIEF COMPLAINT: F/u multifocal right breast cancer  SUMMARY OF ONCOLOGIC HISTORY: Oncology History   Cancer Staging Cancer of overlapping sites of right female breast [facility]) Staging form: Breast, AJCC 8th Edition - Clinical stage from 05/28/2018: Stage IB (cT2, cN1, cM0, G3, ER+, PR+, HER2+) - Signed by [name], MD on 05/28/2018  Malignant neoplasm of lower-outer quadrant of right breast of female, estrogen receptor positive ([place]) Staging form: Breast, AJCC 8th Edition - Clinical stage from 05/28/2018: Stage IIB (cT2, cN1, cM0, G3, ER+, PR+, HER2-) - Unsigned       Cancer of overlapping sites of right female breast ([place])   05/14/2018 Mammogram    Right breast mammogram 05/14/18  IMPRESSION  There is a 1.0 x 0.5 x 1.2 cm lobular hypoechoic mass right breast 7 o'clock position 3 cm from nipple, a 0.8 x 0.6 x 1.2 cm lobular hypoechoic mass right breast 7 o'clock position 2 cm from the nipple, and a 0.9 x 0.5 x 0.8 cm lobular hypoechoic mass right breast 5 o'clock position 3 cm from nipple.  There is a 3.8 x 1.0 x 1.3 cm lobular hypoechoic mass right breast 5 o'clock position 2 cm from nipple.  Multiple thickened right axillary lymph nodes (approximately 15). Measuring up to 1.0 cm in thickness. Adenopathy corresponds with axillary palpable abnormality.  Indeterminate round and punctate calcifications upper-outer quadrant right  breast.Indeterminate round and punctate calcifications upper-outer quadrant right breast.    05/21/2018 Initial Biopsy    Diagnosis 05/21/18  1. Breast, right, needle core biopsy, axilla, 5 o'clock, ribbon clip - INVASIVE MAMMARY CARCINOMA. - MAMMARY CARCINOMA IN SITU. 2. Breast, right, needle core biopsy, 7 o'clock, coil clip - INVASIVE MAMMARY CARCINOMA. - MAMMARY CARCINOMA IN SITU. 3. Lymph node, needle/core biopsy, right axillary LN, hydromark - METASTATIC MAMMARY CARCINOMA.     05/21/2018 Receptors her2    1. PROGNOSTIC INDICATORS Results: The tumor cells are EQUIVOCAL for Her2 (2+);  HER2 **NEGATIVE** by FISH   Estrogen Receptor: 80%, POSITIVE, MODERATE STAINING INTENSITY Progesterone Receptor: 5%, POSITIVE, MODERATE-WEAK STAINING INTENSITY Proliferation Marker Ki67: 10%  3. PROGNOSTIC INDICATORS Results: The tumor cells are POSITIVE for Her2 (3+). Estrogen Receptor: 90%, POSITIVE, STRONG STAINING INTENSITY Progesterone Receptor: 50%, POSITIVE, MODERATE STAINING INTENSITY Proliferation Marker Ki67: 5%    05/26/2018 Initial Diagnosis    Malignant neoplasm of lower-inner quadrant of right breast of female, estrogen receptor positive ([place])    05/27/2018 Mammogram    Left breast mammogram 05/27/18  IMPRESSION: No mammographic evidence of LEFT breast malignancy.    05/28/2018 Cancer Staging    Staging form: Breast, AJCC 8th Edition - Clinical stage from 05/28/2018: Stage IB (cT2, cN1, cM0, G3, ER+, PR+, HER2+) - Signed by [name], MD on 05/28/2018    06/03/2018 Imaging    MR BREAST BILATERAL W WO CONTRAST INC CAD  IMPRESSION: 1. Large enhancing masses and non mass enhancement identified throughout the majority of the right breast. Additionally, there are smaller masses identified within the upper inner and lower  inner right breast. Overall findings are concerning for extensive malignancy throughout all 4 quadrants of the right breast. 2. Extensive bulky lymphadenopathy  identified within the right axilla. The matted appearance makes counting the number of abnormal lymph nodes difficult as they are immediately approximated to each other. 3. At least one abnormal appearing right internal mammary lymph node. 4. Nonenhancing lesion within the sternum with associated sternal marrow heterogeneity, raising the possibility of osseous metastatic disease within the sternum. 5. Two enhancing masses within the left breast as described above.      06/05/2018 PET scan    NM PET Image Initial (PI) Skull Base To Thigh  IMPRESSION: 1. Multiple borderline enlarged right axillary lymph nodes identified exhibiting mild to moderate increased uptake. Can not rule out nodal metastasis. Correlation with biopsy recommended. 2. Asymmetric increased uptake within the right breast corresponding to MRI abnormality. 3. Subcentimeter right retropectoral and right internal mammary lymph nodes are again identified and exhibit nonspecific, low level FDG uptake. 4. Noncalcified solid nodule within the lateral right lower lobe measures 9 mm without significant radiotracer uptake. 5. No hypermetabolic osseous lesions identified.      06/09/2018 -  Chemotherapy    Neoadjuvant chemo TCHP every 3 weeks     06/10/2018 Pathology Results    Surgical pathology  Diagnosis 1. Breast, left, needle core biopsy, LOQ, barbell - MILD FIBROCYSTIC CHANGES. 2. Breast, left, needle core biopsy, UIQ cylinder - MILD FIBROCYSTIC CHANGES.     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 There were no deleterious mutations in AIP, ALK, APC, ATM, AXIN2,BAP1,  BARD1, BLM, BMPR1A, BRCA1, BRCA2, BRIP1, CASR, CDC73, CDH1, CDK4, CDKN1B, CDKN1C, CDKN2A (p14ARF), CDKN2A (p16INK4a), CEBPA, CHEK2, CTNNA1, DICER1, DIS3L2, EGFR (c.2369C>T,  p.Thr790Met variant only), EPCAM (Deletion/duplication testing only), FH, FLCN, GATA2, GPC3, GREM1 (Promoter region deletion/duplication testing only), HOXB13 (c.251G>A, p.Gly84Glu), HRAS, KIT, MAX, MEN1, MET, MITF (c.952G>A, p.Glu318Lys variant only), MLH1, MSH2, MSH3, MSH6, MUTYH, NBN, NF1, NF2, NTHL1, PDGFRA, PHOX2B, PMS2, POLD1, POLE, POT1, PRKAR1A, PTCH1, PTEN, RAD50, RAD51C, RAD51D, RB1, RECQL4, RET, RUNX1, SDHAF2, SDHA (sequence changes only), SDHB, SDHC, SDHD, SMAD4, SMARCA4, SMARCB1, SMARCE1, STK11, SUFU, TERC, TERT, TMEM127, TP53, TSC1, TSC2, VHL, WRN and WT1.  Marland Kitchen      CURRENT THERAPY:  Neoadjuvant chemotherapyTCHP, started2/24/2020  INTERVAL HISTORY:  Elisabella Hacker is here for a follow up and treatment. She presents to the clinic today by herself. She notes she is doing well. She notes having skin dryness, peeling and cracks of her hands and feet. She denies neuropathy. She denies and significant side effects except constipation for 4 days. That improved with Miralax and stool softener. She notes her b/l ankles ans feet were swollen for 26 hours and then resolved. She feels her side effects are slightly different each cycles but able to recover by week 3. She is able to continue to be active.  She feels her LN are improving and her mass in her breast she is not sure of the change. She feels she liked imaging at [facility] better than [facility].    REVIEW OF SYSTEMS:   Constitutional: Denies fevers, chills or abnormal weight loss Eyes: Denies blurriness of vision Ears, nose, mouth, throat, and face: Denies mucositis or sore throat Respiratory: Denies cough, dyspnea or wheezes Cardiovascular: Denies palpitation, chest discomfort or lower extremity swelling Gastrointestinal:  Denies nausea, heartburn or change in  bowel habits Skin: Denies abnormal skin rashes (+) Dry skin with peeling and cracking of hands and feet  Lymphatics: Denies new lymphadenopathy or easy  bruising Neurological:Denies numbness, tingling or new weaknesses Behavioral/Psych: Mood is stable, no new changes  All other systems were reviewed with the patient and are negative.  MEDICAL HISTORY:  Past Medical History:  Diagnosis Date   Acid reflux    Acute bronchitis    Anemia    Asthma    Family history of brain cancer    Family history of breast cancer    Family history of prostate cancer    Migraine     SURGICAL HISTORY: Past Surgical History:  Procedure Laterality Date   BREAST BIOPSY Right 05/23/2018   x3, malignant   DILATION AND CURETTAGE OF UTERUS     2006 x1, 2009 x2   LAPAROTOMY  2008   PORTACATH PLACEMENT Left 06/06/2018   Procedure: INSERTION PORT-A-CATH POSSIBLE ULTRASOUND;  Surgeon: [name], MD;  Location: [facility];  Service: General;  Laterality: Left;    I have reviewed the social history and family history with the patient and they are unchanged from previous note.  ALLERGIES:  has No Known Allergies.  MEDICATIONS:  Current Outpatient Medications  Medication Sig Dispense Refill   albuterol (PROVENTIL HFA;VENTOLIN HFA) 108 (90 BASE) MCG/ACT inhaler Inhale 2 puffs into the lungs every 6 (six) hours as needed for wheezing or shortness of breath.     ALPRAZolam (XANAX) 0.5 MG tablet Take 0.5 mg by mouth daily as needed for anxiety. 3 to 5 times month     calcium carbonate (TUMS - DOSED IN MG ELEMENTAL CALCIUM) 500 MG chewable tablet Chew 1 tablet by mouth daily as needed. heartburn     cetirizine (ZYRTEC) 10 MG tablet Take 10 mg by mouth daily as needed for allergies.     dexamethasone (DECADRON) 4 MG tablet Take 1 tab twice daily, start the day before Taxotere. Take once the day after X3 days 30 tablet 1   diphenoxylate-atropine (LOMOTIL) 2.5-0.025 MG tablet Take 1-2 tablets by mouth 4 (four) times daily as needed for diarrhea or loose stools. 40 tablet 0   fish oil-omega-3 fatty acids 1000 MG capsule Take 1 g by  mouth daily.      lidocaine-prilocaine (EMLA) cream Apply 1 application topically as needed. 30 g 0   ondansetron (ZOFRAN) 8 MG tablet Take 1 tablet (8 mg total) by mouth every 8 (eight) hours as needed for nausea or vomiting. 20 tablet 0   Probiotic Product (PROBIOTIC DAILY PO) Take 1 capsule by mouth daily.      prochlorperazine (COMPAZINE) 10 MG tablet Take 1 tablet (10 mg total) by mouth every 6 (six) hours as needed for nausea or vomiting. 30 tablet 0   propranolol (INDERAL) 10 MG tablet Take 5 mg by mouth daily as needed. Takes as needed for shaking hand less than 10 times per year     No current facility-administered medications for this visit.     PHYSICAL EXAMINATION: ECOG PERFORMANCE STATUS: 0 - Asymptomatic  Vitals:   08/11/18 0914  BP: 107/65  Pulse: 60  Resp: 18  Temp: 97.8 F (36.6 C)  SpO2: 100%   Filed Weights   08/11/18 0914  Weight: 133 lb 12.8 oz (60.7 kg)    GENERAL:alert, no distress and comfortable SKIN: skin color, texture, turgor are normal, no rashes or significant lesions EYES: normal, Conjunctiva are pink and non-injected, sclera clear OROPHARYNX:no exudate,  no erythema and lips, buccal mucosa, and tongue normal  NECK: supple, thyroid normal size, non-tender, without nodularity LYMPH:  no palpable lymphadenopathy in the cervical, axillary or inguinal LUNGS: clear to auscultation and percussion with normal breathing effort HEART: regular rate & rhythm and no murmurs and no lower extremity edema ABDOMEN:abdomen soft, non-tender and normal bowel sounds Musculoskeletal:no cyanosis of digits and no clubbing  NEURO: alert & oriented x 3 with fluent speech, no focal motor/sensory deficits BREAST: (+) right breast mass of lateral side, involving UOQ and LOQ is barely palpable and not measurable (previously 4x3 cm). (+) right axillary LN now 0.5-1cm (previously 1.5-2cm). Her right breast skin color is back to normal. Left breast exam benign.   LABORATORY  DATA:  I have reviewed the data as listed CBC Latest Ref Rng & Units 08/11/2018 07/21/2018 06/30/2018  WBC 4.0 - 10.5 K/uL 4.4 5.0 3.9(L)  Hemoglobin 12.0 - 15.0 g/dL 10.4(L) 10.0(L) 11.0(L)  Hematocrit 36.0 - 46.0 % 32.1(L) 30.9(L) 33.9(L)  Platelets 150 - 400 K/uL 214 256 230     CMP Latest Ref Rng & Units 08/11/2018 07/21/2018 06/30/2018  Glucose 70 - 99 mg/dL 73 85 85  BUN 6 - 20 mg/dL _0 Creatinine 0.44 - 1.00 mg/dL 0.72 0.75 0.68  Sodium 135 - 145 mmol/L 138 138 141  Potassium 3.5 - 5.1 mmol/L 4.1 4.2 3.8  Chloride 98 - 111 mmol/L 102 102 106  CO2 22 - 32 mmol/L _1 Calcium 8.9 - 10.3 mg/dL 9.6 9.7 8.9  Total Protein 6.5 - 8.1 g/dL 7.0 6.6 6.6  Total Bilirubin 0.3 - 1.2 mg/dL 0.6 0.6 0.7  Alkaline Phos 38 - 126 U/L 67 63 53  AST 15 - 41 U/L 10(L) 9(L) 10(L)  ALT 0 - 44 U/L _2 RADIOGRAPHIC STUDIES: I have personally reviewed the radiological images as listed and agreed with the findings in the report. No results found.   ASSESSMENT & PLAN:  Kim [name] is a 43 y.o. 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 [place] also showed 15abnormallymph nodes. -There is discrepancy of her to test results between breast biopsy and axillary lymph node biopsy,we plan to repeat HER2 on her surgical sample. -She was seen by Med Onc Dr. Hiram Comber at [facility] in 05/2018 for second opinion. They recommended she proceed with her current neoadjuvant chemo regimen. -Shehasstartedfirst cycleneoadjuvantTCHP on 2/24/20with Udenyca on day 3. She tolerates well with mild nausea and mild constipation and progressing dry skin of hands and feet.  -She is  clinically doing well. S/p cycle 3 her right breast mass is no longer palpable and her axillary LN is much smaller. Will continue with treatment. Will obtain breast MRI after completion for further evaluation of response to chemo.   -Labs reviewed, CBC WNL except Hg 10.4, CMPWNL, prealbumin pending. Overall adequate to proceed with cycle 4 TCHP today.  -F/u in 3 weeks  -She has decided bilateral mastectomy with reconstruction, I strongly encourage her to see plastic surgeon as soon as possible. She will call for appointments this week.   2.Genetics:PALB68mtation+ -Genetic testingfrom [facility] done 05/2018 was positive for PALB232mation. She has discovered a stronger maternal family history of prostate  and breast cancerin her 2nd and 3rd degree relatives. -We discussed the risk of future breast cancer from [place] mutation, which depends on her family history.I thinkit isveryreasonable to proceed with b/l breast mastectomy given her positive family history of cancers. She is agreeable and is considering reconstruction.She has not determined where to have surgery and reconstruction. -We previously reviewed the risk for other cancers in carriers for PALB2 mutations, including female breast cancer, prostate, ovarian and pancreatic cancer, although these risks have not yet been quantified. While there is an increased risk for other cancers, there are no current guidelines for screening for these cancers, and at this time NCCN does not suggest a discussion of risk reducing salpingo-oophorectomy.  3. Asthma  -mild, continuealbuterol inhaler as needed -I reviewedsheis higherrisk for infection. I discussedinfection precautions and advised herto avoid unnecessary public venues.  -If she develops fever or productive cough with phlegm she can contact our clinic. -stable  4. Diet and fasting  -Pt's husband found some literature of fasting for cancer and chemo, pt may want to try fasting around  chemo  -f/u with dietician -She is still fasting 72 hoursaround her chemo.She did lose 6 pounds with her first cycle of chemo,but has been eating better and able to gain weight.  -She did lose weight after cycle 3. I encourage her to balancing working out and eating adequate on week 3 to recover well.  -Will monitor her pre-albumin, pending today  5. Allergy Symptoms  -She had a recent significant episode of sinus congestion and drainage. Has improved recently.  -I encouraged her to use Claritin, Flonase and continue use of inhaler as needed.  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   No orders of the defined types were placed in this encounter.  All questions were answered. The patient knows to call the clinic with any problems, questions or concerns. No barriers to learning was detected. I spent 20 minutes counseling the patient face to face. The total time spent in the appointment was 25 minutes and more than 50% was on counseling and review of test results     [name], MD 08/11/2018   I, Joslyn Devon, am acting as scribe for [name], MD.   I have reviewed the above documentation for accuracy and completeness, and I agree with the above.

## 2018-08-11 ENCOUNTER — Ambulatory Visit: Payer: BC Managed Care – PPO | Admitting: Medical

## 2018-08-11 ENCOUNTER — Inpatient Hospital Stay: Payer: BC Managed Care – PPO

## 2018-08-11 ENCOUNTER — Inpatient Hospital Stay (HOSPITAL_BASED_OUTPATIENT_CLINIC_OR_DEPARTMENT_OTHER): Payer: BC Managed Care – PPO | Admitting: Hematology

## 2018-08-11 ENCOUNTER — Other Ambulatory Visit: Payer: Self-pay

## 2018-08-11 ENCOUNTER — Encounter: Payer: Self-pay | Admitting: Hematology

## 2018-08-11 ENCOUNTER — Telehealth: Payer: Self-pay | Admitting: Hematology

## 2018-08-11 VITALS — BP 107/65 | HR 60 | Temp 97.8°F | Resp 18 | Ht 69.0 in | Wt 133.8 lb

## 2018-08-11 DIAGNOSIS — C50811 Malignant neoplasm of overlapping sites of right female breast: Secondary | ICD-10-CM

## 2018-08-11 DIAGNOSIS — Z170 Estrogen receptor positive status [ER+]: Secondary | ICD-10-CM

## 2018-08-11 DIAGNOSIS — J45909 Unspecified asthma, uncomplicated: Secondary | ICD-10-CM | POA: Diagnosis not present

## 2018-08-11 DIAGNOSIS — Z5112 Encounter for antineoplastic immunotherapy: Secondary | ICD-10-CM | POA: Diagnosis not present

## 2018-08-11 DIAGNOSIS — D6481 Anemia due to antineoplastic chemotherapy: Secondary | ICD-10-CM

## 2018-08-11 DIAGNOSIS — Z95828 Presence of other vascular implants and grafts: Secondary | ICD-10-CM

## 2018-08-11 LAB — CMP (CANCER CENTER ONLY)
ALT: 19 U/L (ref 0–44)
AST: 10 U/L — ABNORMAL LOW (ref 15–41)
Albumin: 4.1 g/dL (ref 3.5–5.0)
Alkaline Phosphatase: 67 U/L (ref 38–126)
Anion gap: 13 (ref 5–15)
BUN: 14 mg/dL (ref 6–20)
CO2: 23 mmol/L (ref 22–32)
Calcium: 9.6 mg/dL (ref 8.9–10.3)
Chloride: 102 mmol/L (ref 98–111)
Creatinine: 0.72 mg/dL (ref 0.44–1.00)
GFR, Est AFR Am: >60 mL/min (ref >60)
GFR, Estimated: >60 mL/min (ref >60)
Glucose, Bld: 73 mg/dL (ref 70–99)
Potassium: 4.1 mmol/L (ref 3.5–5.1)
Sodium: 138 mmol/L (ref 135–145)
Total Bilirubin: 0.6 mg/dL (ref 0.3–1.2)
Total Protein: 7 g/dL (ref 6.5–8.1)

## 2018-08-11 LAB — CBC WITH DIFFERENTIAL (CANCER CENTER ONLY)
Abs Immature Granulocytes: 0 10*3/uL (ref 0.00–0.07)
Basophils Absolute: 0 10*3/uL (ref 0.0–0.1)
Basophils Relative: 1 %
Eosinophils Absolute: 0 10*3/uL (ref 0.0–0.5)
Eosinophils Relative: 0 %
HCT: 32.1 % — ABNORMAL LOW (ref 36.0–46.0)
Hemoglobin: 10.4 g/dL — ABNORMAL LOW (ref 12.0–15.0)
Immature Granulocytes: 0 %
Lymphocytes Relative: 26 %
Lymphs Abs: 1.2 10*3/uL (ref 0.7–4.0)
MCH: 31.8 pg (ref 26.0–34.0)
MCHC: 32.4 g/dL (ref 30.0–36.0)
MCV: 98.2 fL (ref 80.0–100.0)
Monocytes Absolute: 0.3 10*3/uL (ref 0.1–1.0)
Monocytes Relative: 7 %
Neutro Abs: 2.9 10*3/uL (ref 1.7–7.7)
Neutrophils Relative %: 66 %
Platelet Count: 214 10*3/uL (ref 150–400)
RBC: 3.27 MIL/uL — ABNORMAL LOW (ref 3.87–5.11)
RDW: 16.2 % — ABNORMAL HIGH (ref 11.5–15.5)
WBC Count: 4.4 10*3/uL (ref 4.0–10.5)
nRBC: 0 % (ref 0.0–0.2)

## 2018-08-11 LAB — PREGNANCY, URINE: Preg Test, Ur: NEGATIVE

## 2018-08-11 LAB — PREALBUMIN: Prealbumin: 21.1 mg/dL (ref 18–38)

## 2018-08-11 MED ORDER — PALONOSETRON HCL INJECTION 0.25 MG/5ML
INTRAVENOUS | Status: AC
  Filled 2018-08-11: qty 5

## 2018-08-11 MED ORDER — ACETAMINOPHEN 325 MG PO TABS
ORAL_TABLET | ORAL | Status: AC
  Filled 2018-08-11: qty 2

## 2018-08-11 MED ORDER — SODIUM CHLORIDE 0.9 % IV SOLN
420.0000 mg | Freq: Once | INTRAVENOUS | Status: AC
  Administered 2018-08-11: 420 mg via INTRAVENOUS
  Filled 2018-08-11: qty 14

## 2018-08-11 MED ORDER — HEPARIN SOD (PORK) LOCK FLUSH 100 UNIT/ML IV SOLN
500.0000 [IU] | Freq: Once | INTRAVENOUS | Status: AC | PRN
  Administered 2018-08-11: 500 [IU]
  Filled 2018-08-11: qty 5

## 2018-08-11 MED ORDER — DIPHENHYDRAMINE HCL 25 MG PO CAPS
ORAL_CAPSULE | ORAL | Status: AC
  Filled 2018-08-11: qty 2

## 2018-08-11 MED ORDER — SODIUM CHLORIDE 0.9 % IV SOLN
Freq: Once | INTRAVENOUS | Status: AC
  Administered 2018-08-11: 11:00:00 via INTRAVENOUS
  Filled 2018-08-11: qty 5

## 2018-08-11 MED ORDER — PALONOSETRON HCL INJECTION 0.25 MG/5ML
0.2500 mg | Freq: Once | INTRAVENOUS | Status: AC
  Administered 2018-08-11: 0.25 mg via INTRAVENOUS

## 2018-08-11 MED ORDER — TRASTUZUMAB CHEMO 150 MG IV SOLR
6.0000 mg/kg | Freq: Once | INTRAVENOUS | Status: AC
  Administered 2018-08-11: 378 mg via INTRAVENOUS
  Filled 2018-08-11: qty 18

## 2018-08-11 MED ORDER — SODIUM CHLORIDE 0.9% FLUSH
10.0000 mL | INTRAVENOUS | Status: DC | PRN
  Administered 2018-08-11: 10 mL
  Filled 2018-08-11: qty 10

## 2018-08-11 MED ORDER — DIPHENHYDRAMINE HCL 25 MG PO CAPS
50.0000 mg | ORAL_CAPSULE | Freq: Once | ORAL | Status: AC
  Administered 2018-08-11: 50 mg via ORAL

## 2018-08-11 MED ORDER — SODIUM CHLORIDE 0.9 % IV SOLN
Freq: Once | INTRAVENOUS | Status: AC
  Administered 2018-08-11: 10:00:00 via INTRAVENOUS
  Filled 2018-08-11: qty 250

## 2018-08-11 MED ORDER — SODIUM CHLORIDE 0.9 % IV SOLN
75.0000 mg/m2 | Freq: Once | INTRAVENOUS | Status: AC
  Administered 2018-08-11: 130 mg via INTRAVENOUS
  Filled 2018-08-11: qty 13

## 2018-08-11 MED ORDER — ACETAMINOPHEN 325 MG PO TABS
650.0000 mg | ORAL_TABLET | Freq: Once | ORAL | Status: AC
  Administered 2018-08-11: 650 mg via ORAL

## 2018-08-11 MED ORDER — SODIUM CHLORIDE 0.9 % IV SOLN
671.4000 mg | Freq: Once | INTRAVENOUS | Status: AC
  Administered 2018-08-11: 670 mg via INTRAVENOUS
  Filled 2018-08-11: qty 67

## 2018-08-11 NOTE — Telephone Encounter (Signed)
Scheduled appt per 4/27 los. °

## 2018-08-11 NOTE — Patient Instructions (Signed)
[facility] Discharge Instructions for Patients Receiving Chemotherapy  Today you received the following chemotherapy agents: Herceptin, Perjeta, Taxotere, Carboplatin  To help prevent nausea and vomiting after your treatment, we encourage you to take your nausea medication as directed.   If you develop nausea and vomiting that is not controlled by your nausea medication, call the clinic.   BELOW ARE SYMPTOMS THAT SHOULD BE REPORTED IMMEDIATELY:  *FEVER GREATER THAN 100.5 F  *CHILLS WITH OR WITHOUT FEVER  NAUSEA AND VOMITING THAT IS NOT CONTROLLED WITH YOUR NAUSEA MEDICATION  *UNUSUAL SHORTNESS OF BREATH  *UNUSUAL BRUISING OR BLEEDING  TENDERNESS IN MOUTH AND THROAT WITH OR WITHOUT PRESENCE OF ULCERS  *URINARY PROBLEMS  *BOWEL PROBLEMS  UNUSUAL RASH Items with * indicate a potential emergency and should be followed up as soon as possible.  Feel free to call the clinic should you have any questions or concerns. The clinic phone number is [phone].  Please show the [place] [facility] at check-in to the Emergency Department and triage nurse.  Coronavirus (COVID-19) Are you at risk?  Are you at risk for the Coronavirus (COVID-19)?  To be considered HIGH RISK for Coronavirus (COVID-19), you have to meet the following criteria:  . Traveled to [place], [place], [place], [place] or [place]; or in the [place] to [place], [place], [place], or [place]; and have fever, cough, and shortness of breath within the last 2 weeks of travel OR . Been in close contact with a person diagnosed with COVID-19 within the last 2 weeks and have fever, cough, and shortness of breath . IF YOU DO NOT MEET THESE CRITERIA, YOU ARE CONSIDERED LOW RISK FOR COVID-19.  What to do if you are HIGH RISK for COVID-19?  Marland Kitchen If you are having a medical emergency, call 911. . Seek medical care right away. Before you go to a doctor's office, urgent care or emergency department, call  ahead and tell them about your recent travel, contact with someone diagnosed with COVID-19, and your symptoms. You should receive instructions from your physician's office regarding next steps of care.  . When you arrive at healthcare provider, tell the healthcare staff immediately you have returned from visiting [place], [place], [place], [place] or [place]; or traveled in the [place] to [place], [place], [place], or [place]; in the last two weeks or you have been in close contact with a person diagnosed with COVID-19 in the last 2 weeks.   . Tell the health care staff about your symptoms: fever, cough and shortness of breath. . After you have been seen by a medical provider, you will be either: o Tested for (COVID-19) and discharged home on quarantine except to seek medical care if symptoms worsen, and asked to  - Stay home and avoid contact with others until you get your results (4-5 days)  - Avoid travel on public transportation if possible (such as bus, train, or airplane) or o Sent to the Emergency Department by EMS for evaluation, COVID-19 testing, and possible admission depending on your condition and test results.  What to do if you are LOW RISK for COVID-19?  Reduce your risk of any infection by using the same precautions used for avoiding the common cold or flu:  Marland Kitchen Wash your hands often with soap and warm water for at least 20 seconds.  If soap and water are not readily available, use an alcohol-based hand sanitizer with at least 60% alcohol.  . If  coughing or sneezing, cover your mouth and nose by coughing or sneezing into the elbow areas of your shirt or coat, into a tissue or into your sleeve (not your hands). . Avoid shaking hands with others and consider head nods or verbal greetings only. . Avoid touching your eyes, nose, or mouth with unwashed hands.  . Avoid close contact with people who are sick. . Avoid places or events with large numbers of people in one location,  like concerts or sporting events. . Carefully consider travel plans you have or are making. . If you are planning any travel outside or inside the [place], visit the CDC's Travelers' Health webpage for the latest health notices. . If you have some symptoms but not all symptoms, continue to monitor at home and seek medical attention if your symptoms worsen. . If you are having a medical emergency, call 911.   [place] / e-Visit: [email or web address]         [facility]: [facility]: [phone]                   [facility]: [phone]

## 2018-08-12 ENCOUNTER — Ambulatory Visit: Payer: BC Managed Care – PPO

## 2018-08-13 ENCOUNTER — Other Ambulatory Visit: Payer: Self-pay

## 2018-08-13 ENCOUNTER — Inpatient Hospital Stay: Payer: BC Managed Care – PPO

## 2018-08-13 VITALS — BP 96/56 | HR 58 | Temp 98.3°F

## 2018-08-13 DIAGNOSIS — Z170 Estrogen receptor positive status [ER+]: Principal | ICD-10-CM

## 2018-08-13 DIAGNOSIS — Z5112 Encounter for antineoplastic immunotherapy: Secondary | ICD-10-CM | POA: Diagnosis not present

## 2018-08-13 DIAGNOSIS — C50811 Malignant neoplasm of overlapping sites of right female breast: Secondary | ICD-10-CM

## 2018-08-13 MED ORDER — PEGFILGRASTIM-CBQV 6 MG/0.6ML ~~LOC~~ SOSY
PREFILLED_SYRINGE | SUBCUTANEOUS | Status: AC
  Filled 2018-08-13: qty 0.6

## 2018-08-13 MED ORDER — PEGFILGRASTIM-CBQV 6 MG/0.6ML ~~LOC~~ SOSY
6.0000 mg | PREFILLED_SYRINGE | Freq: Once | SUBCUTANEOUS | Status: AC
  Administered 2018-08-13: 13:00:00 6 mg via SUBCUTANEOUS

## 2018-08-14 ENCOUNTER — Encounter: Payer: Self-pay | Admitting: *Deleted

## 2018-08-14 ENCOUNTER — Ambulatory Visit: Payer: BC Managed Care – PPO

## 2018-08-19 ENCOUNTER — Other Ambulatory Visit (HOSPITAL_COMMUNITY): Payer: Self-pay | Admitting: *Deleted

## 2018-08-19 DIAGNOSIS — C50311 Malignant neoplasm of lower-inner quadrant of right female breast: Secondary | ICD-10-CM

## 2018-08-19 DIAGNOSIS — Z170 Estrogen receptor positive status [ER+]: Principal | ICD-10-CM

## 2018-08-19 NOTE — Progress Notes (Signed)
Per referred to cardio/onc clinic by Dr Burr Medico for her breast cancer.  Echo sch for 5/14 and telehealth visit with Dr Haroldine Laws 5/26.  Order for echo placed.

## 2018-08-22 ENCOUNTER — Encounter: Payer: Self-pay | Admitting: Hematology

## 2018-08-28 ENCOUNTER — Other Ambulatory Visit (HOSPITAL_COMMUNITY): Payer: BC Managed Care – PPO

## 2018-08-28 ENCOUNTER — Ambulatory Visit (HOSPITAL_COMMUNITY): Payer: BC Managed Care – PPO | Admitting: Internal Medicine

## 2018-08-28 ENCOUNTER — Ambulatory Visit (HOSPITAL_COMMUNITY)
Admission: RE | Admit: 2018-08-28 | Discharge: 2018-08-28 | Disposition: A | Discharge status: Home / Self Care | Payer: BC Managed Care – PPO | Source: Ambulatory Visit | Attending: Family Medicine | Admitting: Family Medicine

## 2018-08-28 ENCOUNTER — Ambulatory Visit (HOSPITAL_COMMUNITY): Payer: BC Managed Care – PPO

## 2018-08-28 ENCOUNTER — Other Ambulatory Visit: Payer: Self-pay

## 2018-08-28 DIAGNOSIS — C50311 Malignant neoplasm of lower-inner quadrant of right female breast: Secondary | ICD-10-CM | POA: Diagnosis not present

## 2018-08-28 DIAGNOSIS — Z170 Estrogen receptor positive status [ER+]: Secondary | ICD-10-CM | POA: Insufficient documentation

## 2018-08-28 NOTE — Progress Notes (Signed)
  Echocardiogram 2D Echocardiogram has been performed.  Darlina Sicilian M 08/28/2018, 9:40 AM

## 2018-08-29 NOTE — Progress Notes (Signed)
[place]   Telephone:([phone]) [phone] Fax:(336) [phone]   Clinic Follow up Note   Patient Care Team: [name], MD as PCP - General (Family Medicine) [name], MD as Consulting Physician (General Surgery) [name], MD as Consulting Physician (Hematology) [name], MD as Consulting Physician (Radiation Oncology) [name], MD as Consulting Physician (Obstetrics and Gynecology) [name], RN as Oncology Nurse Navigator [name], RN as Oncology Nurse Navigator  Date of Service:  09/01/2018  CHIEF COMPLAINT: F/u multifocal right breast cancer  SUMMARY OF ONCOLOGIC HISTORY: Oncology History   Cancer Staging Cancer of overlapping sites of right female breast [facility]) Staging form: Breast, AJCC 8th Edition - Clinical stage from 05/28/2018: Stage IB (cT2, cN1, cM0, G3, ER+, PR+, HER2+) - Signed by [name], MD on 05/28/2018  Malignant neoplasm of lower-outer quadrant of right breast of female, estrogen receptor positive ([place]) Staging form: Breast, AJCC 8th Edition - Clinical stage from 05/28/2018: Stage IIB (cT2, cN1, cM0, G3, ER+, PR+, HER2-) - Unsigned       Cancer of overlapping sites of right female breast ([place])   05/14/2018 Mammogram    Right breast mammogram 05/14/18  IMPRESSION  There is a 1.0 x 0.5 x 1.2 cm lobular hypoechoic mass right breast 7 o'clock position 3 cm from nipple, a 0.8 x 0.6 x 1.2 cm lobular hypoechoic mass right breast 7 o'clock position 2 cm from the nipple, and a 0.9 x 0.5 x 0.8 cm lobular hypoechoic mass right breast 5 o'clock position 3 cm from nipple.  There is a 3.8 x 1.0 x 1.3 cm lobular hypoechoic mass right breast 5 o'clock position 2 cm from nipple.  Multiple thickened right axillary lymph nodes (approximately 15). Measuring up to 1.0 cm in thickness. Adenopathy corresponds with axillary palpable abnormality.  Indeterminate round and punctate calcifications upper-outer quadrant right  breast.Indeterminate round and punctate calcifications upper-outer quadrant right breast.    05/21/2018 Initial Biopsy    Diagnosis 05/21/18  1. Breast, right, needle core biopsy, axilla, 5 o'clock, ribbon clip - INVASIVE MAMMARY CARCINOMA. - MAMMARY CARCINOMA IN SITU. 2. Breast, right, needle core biopsy, 7 o'clock, coil clip - INVASIVE MAMMARY CARCINOMA. - MAMMARY CARCINOMA IN SITU. 3. Lymph node, needle/core biopsy, right axillary LN, hydromark - METASTATIC MAMMARY CARCINOMA.     05/21/2018 Receptors her2    1. PROGNOSTIC INDICATORS Results: The tumor cells are EQUIVOCAL for Her2 (2+);  HER2 **NEGATIVE** by FISH   Estrogen Receptor: 80%, POSITIVE, MODERATE STAINING INTENSITY Progesterone Receptor: 5%, POSITIVE, MODERATE-WEAK STAINING INTENSITY Proliferation Marker Ki67: 10%  3. PROGNOSTIC INDICATORS Results: The tumor cells are POSITIVE for Her2 (3+). Estrogen Receptor: 90%, POSITIVE, STRONG STAINING INTENSITY Progesterone Receptor: 50%, POSITIVE, MODERATE STAINING INTENSITY Proliferation Marker Ki67: 5%    05/26/2018 Initial Diagnosis    Malignant neoplasm of lower-inner quadrant of right breast of female, estrogen receptor positive ([place])    05/27/2018 Mammogram    Left breast mammogram 05/27/18  IMPRESSION: No mammographic evidence of LEFT breast malignancy.    05/28/2018 Cancer Staging    Staging form: Breast, AJCC 8th Edition - Clinical stage from 05/28/2018: Stage IB (cT2, cN1, cM0, G3, ER+, PR+, HER2+) - Signed by [name], MD on 05/28/2018    06/03/2018 Imaging    MR BREAST BILATERAL W WO CONTRAST INC CAD  IMPRESSION: 1. Large enhancing masses and non mass enhancement identified throughout the majority of the right breast. Additionally, there are smaller masses identified within the upper inner and lower  inner right breast. Overall findings are concerning for extensive malignancy throughout all 4 quadrants of the right breast. 2. Extensive bulky lymphadenopathy  identified within the right axilla. The matted appearance makes counting the number of abnormal lymph nodes difficult as they are immediately approximated to each other. 3. At least one abnormal appearing right internal mammary lymph node. 4. Nonenhancing lesion within the sternum with associated sternal marrow heterogeneity, raising the possibility of osseous metastatic disease within the sternum. 5. Two enhancing masses within the left breast as described above.      06/05/2018 PET scan    NM PET Image Initial (PI) Skull Base To Thigh  IMPRESSION: 1. Multiple borderline enlarged right axillary lymph nodes identified exhibiting mild to moderate increased uptake. Can not rule out nodal metastasis. Correlation with biopsy recommended. 2. Asymmetric increased uptake within the right breast corresponding to MRI abnormality. 3. Subcentimeter right retropectoral and right internal mammary lymph nodes are again identified and exhibit nonspecific, low level FDG uptake. 4. Noncalcified solid nodule within the lateral right lower lobe measures 9 mm without significant radiotracer uptake. 5. No hypermetabolic osseous lesions identified.      06/09/2018 -  Chemotherapy    Neoadjuvant chemo TCHP every 3 weeks     06/10/2018 Pathology Results    Surgical pathology  Diagnosis 1. Breast, left, needle core biopsy, LOQ, barbell - MILD FIBROCYSTIC CHANGES. 2. Breast, left, needle core biopsy, UIQ cylinder - MILD FIBROCYSTIC CHANGES.     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 There were no deleterious mutations in AIP, ALK, APC, ATM, AXIN2,BAP1,  BARD1, BLM, BMPR1A, BRCA1, BRCA2, BRIP1, CASR, CDC73, CDH1, CDK4, CDKN1B, CDKN1C, CDKN2A (p14ARF), CDKN2A (p16INK4a), CEBPA, CHEK2, CTNNA1, DICER1, DIS3L2, EGFR (c.2369C>T,  p.Thr790Met variant only), EPCAM (Deletion/duplication testing only), FH, FLCN, GATA2, GPC3, GREM1 (Promoter region deletion/duplication testing only), HOXB13 (c.251G>A, p.Gly84Glu), HRAS, KIT, MAX, MEN1, MET, MITF (c.952G>A, p.Glu318Lys variant only), MLH1, MSH2, MSH3, MSH6, MUTYH, NBN, NF1, NF2, NTHL1, PDGFRA, PHOX2B, PMS2, POLD1, POLE, POT1, PRKAR1A, PTCH1, PTEN, RAD50, RAD51C, RAD51D, RB1, RECQL4, RET, RUNX1, SDHAF2, SDHA (sequence changes only), SDHB, SDHC, SDHD, SMAD4, SMARCA4, SMARCB1, SMARCE1, STK11, SUFU, TERC, TERT, TMEM127, TP53, TSC1, TSC2, VHL, WRN and WT1.  Marland Kitchen      CURRENT THERAPY:  Neoadjuvant chemotherapyTCHP, started2/24/2020  INTERVAL HISTORY:  Cataleah Stites is here for a follow up and treatment. She is here alone. She notes she went to [facility] to meet a Research scientist (medical). Plastic surgeon considered her own tissue with small implant. She is considering when to do reconstructive surgery and radiation.  She notes her mother was just diagnosed with breast cancer. She is living with her now. Her mother is 51 and good health. She has been stressed with this. She is eating well with her fasting. She was still able to gain weight. She has very little nausea, no more diarrhea, and has controlled constipation. She notes her fatigued has progressed as well as muscle soreness from exercise. She notes her mouth goes numb for 3-4 days and her taste changes. She notes tingling in her fingers and hands passes in 36 hours.  She notes she has concern about her children in school with COVID-19. She notes she has had her PNA vaccine in recent years.     REVIEW OF SYSTEMS:   Constitutional: Denies fevers, chills or abnormal weight loss (+) fatigue  Eyes: Denies blurriness of vision Ears, nose, mouth, throat, and face: Denies mucositis or sore throat Respiratory: Denies cough, dyspnea or wheezes Cardiovascular: Denies palpitation, chest discomfort or lower extremity swelling  Gastrointestinal:  Denies heartburn (+) very mild nausea, controlled constipation  MSK: (+) Muscle fatigue  Skin: Denies abnormal skin rashes Lymphatics: Denies new lymphadenopathy or easy bruising Neurological:Denies new weaknesses (+) Temporary mouth numbness and tingling of her fingers Behavioral/Psych: Mood is stable, no new changes  All other systems were reviewed with the patient and are negative.  MEDICAL HISTORY:  Past Medical History:  Diagnosis Date  . Acid reflux   . Acute bronchitis   . Anemia   . Asthma   . Family history of brain cancer   . Family history of breast cancer   . Family history of prostate cancer   . Migraine     SURGICAL HISTORY: Past Surgical History:  Procedure Laterality Date  . BREAST BIOPSY Right 05/23/2018   x3, malignant  . DILATION AND CURETTAGE OF UTERUS     2006 x1, 2009 x2  . LAPAROTOMY  2008  . PORTACATH PLACEMENT Left 06/06/2018   Procedure: INSERTION PORT-A-CATH POSSIBLE ULTRASOUND;  Surgeon: [name], MD;  Location: [facility];  Service: General;  Laterality: Left;    I have reviewed the social history and family history with the patient and they are unchanged from previous note.  ALLERGIES:  has No Known Allergies.  MEDICATIONS:  Current Outpatient Medications  Medication Sig Dispense Refill  . albuterol (PROVENTIL HFA;VENTOLIN HFA) 108 (90 BASE) MCG/ACT inhaler Inhale 2 puffs into the lungs every 6 (six) hours as needed for wheezing or shortness of breath.    . ALPRAZolam (XANAX) 0.5 MG tablet Take 0.5 mg by mouth daily as needed for anxiety. 3 to 5 times month    . calcium carbonate (TUMS - DOSED IN MG ELEMENTAL CALCIUM) 500 MG chewable tablet Chew 1 tablet by mouth daily as needed. heartburn    . cetirizine (ZYRTEC) 10 MG tablet Take 10 mg by mouth daily as needed for allergies.    Marland Kitchen dexamethasone (DECADRON) 4 MG tablet Take 1 tab twice daily, start the day before Taxotere. Take once the day after X3 days  30 tablet 1  . diphenoxylate-atropine (LOMOTIL) 2.5-0.025 MG tablet Take 1-2 tablets by mouth 4 (four) times daily as needed for diarrhea or loose stools. 40 tablet 0  . fish oil-omega-3 fatty acids 1000 MG capsule Take 1 g by mouth daily.     . Fluticasone Propionate (FLONASE ALLERGY RELIEF NA) Place into the nose.    . lidocaine-prilocaine (EMLA) cream Apply 1 application topically as needed. 30 g 0  . ondansetron (ZOFRAN) 8 MG tablet Take 1 tablet (8 mg total) by mouth every 8 (eight) hours as needed for nausea or vomiting. 20 tablet 0  . Probiotic Product (PROBIOTIC DAILY PO) Take 1 capsule by mouth daily.     . prochlorperazine (COMPAZINE) 10 MG tablet Take 1 tablet (10 mg total) by mouth every 6 (six) hours as needed for nausea or vomiting. 30 tablet 0  . propranolol (INDERAL) 10 MG tablet Take 5 mg by mouth daily as needed. Takes as needed for shaking hand less than 10 times per year     No current facility-administered medications for this visit.     PHYSICAL EXAMINATION: ECOG PERFORMANCE STATUS: 1 - Symptomatic but completely ambulatory  Vitals:   09/01/18 0846  BP: 105/66  Pulse: 62  Resp: 17  Temp: 98.5 F (36.9 C)  SpO2: 100%   Filed Weights   09/01/18 0846  Weight: 135 lb 11.2 oz (61.6 kg)    GENERAL:alert, no distress and comfortable SKIN: skin color, texture, turgor are normal, no rashes or significant lesions EYES: normal, Conjunctiva are pink and non-injected, sclera clear OROPHARYNX:no exudate, no erythema and lips, buccal mucosa, and tongue normal  NECK: supple, thyroid normal size, non-tender, without nodularity LYMPH:  no palpable lymphadenopathy in the cervical, axillary or inguinal LUNGS: clear to auscultation and percussion with normal breathing effort HEART: regular rate & rhythm and no murmurs and no lower extremity edema ABDOMEN:abdomen soft, non-tender and normal bowel sounds Musculoskeletal:no cyanosis of digits and no clubbing  NEURO: alert &  oriented x 3 with fluent speech, no focal motor/sensory deficits (+) right breast mass of lateral side, involving UOQ and LOQ is no longer palpable and not measurable (previously 4x3 cm). (+) right axillary LN no loner palpable (previously 0.5-1cm). Her right breast skin color is back to normal. Left breast exam benign.  LABORATORY DATA:  I have reviewed the data as listed CBC Latest Ref Rng & Units 09/01/2018 08/11/2018 07/21/2018  WBC 4.0 - 10.5 K/uL 5.0 4.4 5.0  Hemoglobin 12.0 - 15.0 g/dL 10.2(L) 10.4(L) 10.0(L)  Hematocrit 36.0 - 46.0 % 31.5(L) 32.1(L) 30.9(L)  Platelets 150 - 400 K/uL 190 214 256     CMP Latest Ref Rng & Units 09/01/2018 08/11/2018 07/21/2018  Glucose 70 - 99 mg/dL 84 73 85  BUN 6 - 20 mg/dL _0 Creatinine 0.44 - 1.00 mg/dL 0.71 0.72 0.75  Sodium 135 - 145 mmol/L 139 138 138  Potassium 3.5 - 5.1 mmol/L 4.0 4.1 4.2  Chloride 98 - 111 mmol/L 102 102 102  CO2 22 - 32 mmol/L _1 Calcium 8.9 - 10.3 mg/dL 9.5 9.6 9.7  Total Protein 6.5 - 8.1 g/dL 6.6 7.0 6.6  Total Bilirubin 0.3 - 1.2 mg/dL 0.6 0.6 0.6  Alkaline Phos 38 - 126 U/L 56 67 63  AST 15 - 41 U/L 11(L) 10(L) 9(L)  ALT 0 - 44 U/L _2 RADIOGRAPHIC STUDIES: I have personally reviewed the radiological images as listed and agreed with the findings in the report. No results found.   ASSESSMENT & PLAN:  Kim [name] is a 43 y.o. 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 [place] also showed 15abnormallymph nodes. -There is discrepancy of her to test results between breast biopsy and axillary lymph node biopsy,we plan to repeat HER2 on her surgical sample. -She  was seen by Med Onc Dr. Hiram Comber at [facility] in 05/2018 for second opinion. Theyrecommendedshe proceed with her current neoadjuvant chemoregimen. -ShehasstartedneoadjuvantTCHP on 2/24/20with Udenyca on day 3. She tolerates well with mild nausea and mild constipation and progressing dry skin of hands and feet.  -S/p cycle 4 she has progressed fatigue and muscle soreness, temporary mouth numbness and tingling of fingers, very little nausea and controlled constipation. Her right breast mass and LN are no longer palpable. Clinically she has had a good response to treatment.  -Labs reviewed, CBC WNL except Hg 10.2, CMP WNL, Pre-albumin WNL. Overall adequate to proceed with cycle 5 TCHP.  -F/u  in 3 weeks with last cycle chemo. She will proceed with MRI at [facility] afterward where she opted to have b/l mastectomy and reconstruction  -she has not decided where to have postmastectomy radiation  -I reviewed COVID-19 precautions and discussed her risk for infection. I encouraged her to stay up to date with her vaccinations.   2.Genetics:PALB87mtation+ -Genetic testingfrom [facility] done 05/2018 was positive for PALB275mation. She has discovered a stronger maternal family history of prostate and breast cancerin her 2nd and 3rd degree relatives. -We discussed the risk of future breast cancer from [place]AV[place]utation, which depends on her family history.I thinkit isveryreasonable to proceed with b/l breast mastectomy given her positive family history of cancers. She is agreeable and is considering reconstruction.She has not determined where to have surgery and reconstruction. -Wepreviouslyreviewed the risk for other cancers in carriers for PALB2 mutations, including female breast cancer, prostate, ovarian and pancreatic cancer, although these risks have not yet been quantified. While there is an increased risk for other cancers, there are no current guidelines for screening for these cancers, and at this time NCCN does  not suggest a discussion of risk reducing salpingo-oophorectomy. -Her mother, [id number] recently diagnosed with breast cancer, probably PALB2 mutation related   3. Asthma  -mild, continuealbuterol inhaler as needed -I reviewedsheis higherrisk for infection. I discussedinfection precautions and advised herto avoid unnecessary public venues.  -If she develops fever or productive cough with phlegm she can contact our clinic. -stable  4. Diet and fasting  -Pt's husband found some literature of fasting for cancer and chemo, pt may want to try fasting around chemo  -f/u with dietician -She is still fasting 72 hoursaround her chemo.She did lose 6 poundswithher first cycle of chemo,but has been eating better and was able to gain weight.  -She did lose weight after cycle 3. I encourage her to balance working out and eating adequately on week 3 to recover well.  -Will monitor. She has been able to gain weight lately.   5. Allergy Symptoms  -She had a recent significant episode of sinus congestion and drainage. Has improved recently.  -I encouraged her to useClaritin,Flonase and continue use of inhaler as needed.  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 72m72mLL lung nodule  -Her initial staging PET scan showed a 9 mm solid rounded nodule within the lateral right lower lobe, not hypermetabolic -will repeat CT chest after she completes neoadjuvant chemo   Plan -Labs reviewedand adequate to proceedwithcycle5TCHPtoday -Lab, flush, f/u and last TCHP in 3 weeks for last cycle chemo, will order CT chest on next visit     No problem-specific Assessment & Plan notes found for this encounter.   No orders of the defined types were placed in this encounter.  All  questions were answered. The patient knows to call the clinic with any problems, questions or concerns. No barriers to learning was detected. I spent 20 minutes counseling the patient face to face. The total time spent in the appointment was 25 minutes and more than 50% was on counseling and review of test results     YanTruitt MerleD 09/01/2018   I, AmoJoslyn Devonm acting as scribe for YanTruitt MerleD.   I have reviewed the above documentation for accuracy and completeness, and I agree  with the above.

## 2018-09-01 ENCOUNTER — Inpatient Hospital Stay: Payer: BC Managed Care – PPO

## 2018-09-01 ENCOUNTER — Telehealth: Payer: Self-pay | Admitting: Hematology

## 2018-09-01 ENCOUNTER — Telehealth: Payer: Self-pay | Admitting: *Deleted

## 2018-09-01 ENCOUNTER — Other Ambulatory Visit: Payer: Self-pay

## 2018-09-01 ENCOUNTER — Inpatient Hospital Stay (HOSPITAL_BASED_OUTPATIENT_CLINIC_OR_DEPARTMENT_OTHER): Payer: BC Managed Care – PPO | Admitting: Hematology

## 2018-09-01 ENCOUNTER — Encounter: Payer: Self-pay | Admitting: Hematology

## 2018-09-01 ENCOUNTER — Inpatient Hospital Stay: Payer: BC Managed Care – PPO | Attending: Hematology

## 2018-09-01 VITALS — BP 105/66 | HR 62 | Temp 98.5°F | Resp 17 | Ht 69.0 in | Wt 135.7 lb

## 2018-09-01 DIAGNOSIS — C773 Secondary and unspecified malignant neoplasm of axilla and upper limb lymph nodes: Secondary | ICD-10-CM

## 2018-09-01 DIAGNOSIS — D6481 Anemia due to antineoplastic chemotherapy: Secondary | ICD-10-CM | POA: Diagnosis not present

## 2018-09-01 DIAGNOSIS — Z5111 Encounter for antineoplastic chemotherapy: Secondary | ICD-10-CM | POA: Insufficient documentation

## 2018-09-01 DIAGNOSIS — Z170 Estrogen receptor positive status [ER+]: Secondary | ICD-10-CM

## 2018-09-01 DIAGNOSIS — J45909 Unspecified asthma, uncomplicated: Secondary | ICD-10-CM | POA: Insufficient documentation

## 2018-09-01 DIAGNOSIS — Z5189 Encounter for other specified aftercare: Secondary | ICD-10-CM | POA: Insufficient documentation

## 2018-09-01 DIAGNOSIS — Z5112 Encounter for antineoplastic immunotherapy: Secondary | ICD-10-CM | POA: Diagnosis not present

## 2018-09-01 DIAGNOSIS — C50811 Malignant neoplasm of overlapping sites of right female breast: Secondary | ICD-10-CM

## 2018-09-01 DIAGNOSIS — Z95828 Presence of other vascular implants and grafts: Secondary | ICD-10-CM

## 2018-09-01 LAB — CBC WITH DIFFERENTIAL (CANCER CENTER ONLY)
Abs Immature Granulocytes: 0.01 10*3/uL (ref 0.00–0.07)
Basophils Absolute: 0 10*3/uL (ref 0.0–0.1)
Basophils Relative: 1 %
Eosinophils Absolute: 0 10*3/uL (ref 0.0–0.5)
Eosinophils Relative: 0 %
HCT: 31.5 % — ABNORMAL LOW (ref 36.0–46.0)
Hemoglobin: 10.2 g/dL — ABNORMAL LOW (ref 12.0–15.0)
Immature Granulocytes: 0 %
Lymphocytes Relative: 26 %
Lymphs Abs: 1.3 10*3/uL (ref 0.7–4.0)
MCH: 32.2 pg (ref 26.0–34.0)
MCHC: 32.4 g/dL (ref 30.0–36.0)
MCV: 99.4 fL (ref 80.0–100.0)
Monocytes Absolute: 0.4 10*3/uL (ref 0.1–1.0)
Monocytes Relative: 9 %
Neutro Abs: 3.2 10*3/uL (ref 1.7–7.7)
Neutrophils Relative %: 64 %
Platelet Count: 190 10*3/uL (ref 150–400)
RBC: 3.17 MIL/uL — ABNORMAL LOW (ref 3.87–5.11)
RDW: 15.9 % — ABNORMAL HIGH (ref 11.5–15.5)
WBC Count: 5 10*3/uL (ref 4.0–10.5)
nRBC: 0 % (ref 0.0–0.2)

## 2018-09-01 LAB — CMP (CANCER CENTER ONLY)
ALT: 24 U/L (ref 0–44)
AST: 11 U/L — ABNORMAL LOW (ref 15–41)
Albumin: 4 g/dL (ref 3.5–5.0)
Alkaline Phosphatase: 56 U/L (ref 38–126)
Anion gap: 12 (ref 5–15)
BUN: 13 mg/dL (ref 6–20)
CO2: 25 mmol/L (ref 22–32)
Calcium: 9.5 mg/dL (ref 8.9–10.3)
Chloride: 102 mmol/L (ref 98–111)
Creatinine: 0.71 mg/dL (ref 0.44–1.00)
GFR, Est AFR Am: >60 mL/min (ref >60)
GFR, Estimated: >60 mL/min (ref >60)
Glucose, Bld: 84 mg/dL (ref 70–99)
Potassium: 4 mmol/L (ref 3.5–5.1)
Sodium: 139 mmol/L (ref 135–145)
Total Bilirubin: 0.6 mg/dL (ref 0.3–1.2)
Total Protein: 6.6 g/dL (ref 6.5–8.1)

## 2018-09-01 LAB — PREGNANCY, URINE: Preg Test, Ur: NEGATIVE

## 2018-09-01 LAB — PREALBUMIN: Prealbumin: 20.2 mg/dL (ref 18–38)

## 2018-09-01 MED ORDER — SODIUM CHLORIDE 0.9% FLUSH
10.0000 mL | INTRAVENOUS | Status: DC | PRN
  Administered 2018-09-01: 10 mL
  Filled 2018-09-01: qty 10

## 2018-09-01 MED ORDER — ACETAMINOPHEN 325 MG PO TABS
650.0000 mg | ORAL_TABLET | Freq: Once | ORAL | Status: AC
  Administered 2018-09-01: 650 mg via ORAL

## 2018-09-01 MED ORDER — SODIUM CHLORIDE 0.9 % IV SOLN
671.4000 mg | Freq: Once | INTRAVENOUS | Status: AC
  Administered 2018-09-01: 670 mg via INTRAVENOUS
  Filled 2018-09-01: qty 67

## 2018-09-01 MED ORDER — ACETAMINOPHEN 325 MG PO TABS
ORAL_TABLET | ORAL | Status: AC
  Filled 2018-09-01: qty 2

## 2018-09-01 MED ORDER — SODIUM CHLORIDE 0.9 % IV SOLN
75.0000 mg/m2 | Freq: Once | INTRAVENOUS | Status: AC
  Administered 2018-09-01: 130 mg via INTRAVENOUS
  Filled 2018-09-01: qty 13

## 2018-09-01 MED ORDER — PALONOSETRON HCL INJECTION 0.25 MG/5ML
0.2500 mg | Freq: Once | INTRAVENOUS | Status: AC
  Administered 2018-09-01: 10:00:00 0.25 mg via INTRAVENOUS

## 2018-09-01 MED ORDER — SODIUM CHLORIDE 0.9 % IV SOLN
420.0000 mg | Freq: Once | INTRAVENOUS | Status: AC
  Administered 2018-09-01: 420 mg via INTRAVENOUS
  Filled 2018-09-01: qty 14

## 2018-09-01 MED ORDER — PALONOSETRON HCL INJECTION 0.25 MG/5ML
INTRAVENOUS | Status: AC
  Filled 2018-09-01: qty 5

## 2018-09-01 MED ORDER — HEPARIN SOD (PORK) LOCK FLUSH 100 UNIT/ML IV SOLN
500.0000 [IU] | Freq: Once | INTRAVENOUS | Status: AC | PRN
  Administered 2018-09-01: 15:00:00 500 [IU]
  Filled 2018-09-01: qty 5

## 2018-09-01 MED ORDER — DIPHENHYDRAMINE HCL 25 MG PO CAPS
50.0000 mg | ORAL_CAPSULE | Freq: Once | ORAL | Status: AC
  Administered 2018-09-01: 50 mg via ORAL

## 2018-09-01 MED ORDER — SODIUM CHLORIDE 0.9 % IV SOLN
Freq: Once | INTRAVENOUS | Status: AC
  Administered 2018-09-01: 10:00:00 via INTRAVENOUS
  Filled 2018-09-01: qty 5

## 2018-09-01 MED ORDER — TRASTUZUMAB CHEMO 150 MG IV SOLR
6.0000 mg/kg | Freq: Once | INTRAVENOUS | Status: AC
  Administered 2018-09-01: 378 mg via INTRAVENOUS
  Filled 2018-09-01: qty 18

## 2018-09-01 MED ORDER — SODIUM CHLORIDE 0.9 % IV SOLN
Freq: Once | INTRAVENOUS | Status: AC
  Administered 2018-09-01: 10:00:00 via INTRAVENOUS
  Filled 2018-09-01: qty 250

## 2018-09-01 MED ORDER — DIPHENHYDRAMINE HCL 25 MG PO CAPS
ORAL_CAPSULE | ORAL | Status: AC
  Filled 2018-09-01: qty 2

## 2018-09-01 NOTE — Patient Instructions (Signed)
[facility] Discharge Instructions for Patients Receiving Chemotherapy  Today you received the following chemotherapy agents Herceptin, Perjeta, Taxotere and Carboplatin   To help prevent nausea and vomiting after your treatment, we encourage you to take your nausea medication as directed.    If you develop nausea and vomiting that is not controlled by your nausea medication, call the clinic.   BELOW ARE SYMPTOMS THAT SHOULD BE REPORTED IMMEDIATELY:  *FEVER GREATER THAN 100.5 F  *CHILLS WITH OR WITHOUT FEVER  NAUSEA AND VOMITING THAT IS NOT CONTROLLED WITH YOUR NAUSEA MEDICATION  *UNUSUAL SHORTNESS OF BREATH  *UNUSUAL BRUISING OR BLEEDING  TENDERNESS IN MOUTH AND THROAT WITH OR WITHOUT PRESENCE OF ULCERS  *URINARY PROBLEMS  *BOWEL PROBLEMS  UNUSUAL RASH Items with * indicate a potential emergency and should be followed up as soon as possible.  Feel free to call the clinic should you have any questions or concerns. The clinic phone number is [phone].  Please show the CHEMO ALERT CARD at check-in to the Emergency Department and triage nurse.   

## 2018-09-01 NOTE — Telephone Encounter (Signed)
Spoke with patient to follow up during her treatment.  She is scheduled for her 5th cycle of TCH/P today. She states she is doing well. She informed me that she will be having surgery in [facility] with Dr. Dorann Ou.  I will inform team.  Encouraged her to call with any needs or concerns.

## 2018-09-01 NOTE — Telephone Encounter (Signed)
No los per 5/18. °

## 2018-09-03 ENCOUNTER — Other Ambulatory Visit: Payer: Self-pay

## 2018-09-03 ENCOUNTER — Inpatient Hospital Stay: Payer: BC Managed Care – PPO

## 2018-09-03 VITALS — BP 94/61 | HR 58 | Temp 97.4°F | Resp 16

## 2018-09-03 DIAGNOSIS — C50811 Malignant neoplasm of overlapping sites of right female breast: Secondary | ICD-10-CM

## 2018-09-03 DIAGNOSIS — Z5112 Encounter for antineoplastic immunotherapy: Secondary | ICD-10-CM | POA: Diagnosis not present

## 2018-09-03 DIAGNOSIS — Z170 Estrogen receptor positive status [ER+]: Secondary | ICD-10-CM

## 2018-09-03 MED ORDER — PEGFILGRASTIM-CBQV 6 MG/0.6ML ~~LOC~~ SOSY
PREFILLED_SYRINGE | SUBCUTANEOUS | Status: AC
  Filled 2018-09-03: qty 0.6

## 2018-09-03 MED ORDER — PEGFILGRASTIM-CBQV 6 MG/0.6ML ~~LOC~~ SOSY
6.0000 mg | PREFILLED_SYRINGE | Freq: Once | SUBCUTANEOUS | Status: AC
  Administered 2018-09-03: 6 mg via SUBCUTANEOUS

## 2018-09-03 NOTE — Patient Instructions (Signed)
Pegfilgrastim injection  What is this medicine?  PEGFILGRASTIM (PEG fil gra stim) is a long-acting granulocyte colony-stimulating factor that stimulates the growth of neutrophils, a type of white blood cell important in the body's fight against infection. It is used to reduce the incidence of fever and infection in patients with certain types of cancer who are receiving chemotherapy that affects the bone marrow, and to increase survival after being exposed to high doses of radiation.  This medicine may be used for other purposes; ask your health care provider or pharmacist if you have questions.  COMMON BRAND NAME(S): Fulphila, Neulasta, UDENYCA  What should I tell my health care provider before I take this medicine?  They need to know if you have any of these conditions:  -kidney disease  -latex allergy  -ongoing radiation therapy  -sickle cell disease  -skin reactions to acrylic adhesives (On-Body Injector only)  -an unusual or allergic reaction to pegfilgrastim, filgrastim, other medicines, foods, dyes, or preservatives  -pregnant or trying to get pregnant  -breast-feeding  How should I use this medicine?  This medicine is for injection under the skin. If you get this medicine at home, you will be taught how to prepare and give the pre-filled syringe or how to use the On-body Injector. Refer to the patient Instructions for Use for detailed instructions. Use exactly as directed. Tell your healthcare provider immediately if you suspect that the On-body Injector may not have performed as intended or if you suspect the use of the On-body Injector resulted in a missed or partial dose.  It is important that you put your used needles and syringes in a special sharps container. Do not put them in a trash can. If you do not have a sharps container, call your pharmacist or healthcare provider to get one.  Talk to your pediatrician regarding the use of this medicine in children. While this drug may be prescribed for  selected conditions, precautions do apply.  Overdosage: If you think you have taken too much of this medicine contact a poison control center or emergency room at once.  NOTE: This medicine is only for you. Do not share this medicine with others.  What if I miss a dose?  It is important not to miss your dose. Call your doctor or health care professional if you miss your dose. If you miss a dose due to an On-body Injector failure or leakage, a new dose should be administered as soon as possible using a single prefilled syringe for manual use.  What may interact with this medicine?  Interactions have not been studied.  Give your health care provider a list of all the medicines, herbs, non-prescription drugs, or dietary supplements you use. Also tell them if you smoke, drink alcohol, or use illegal drugs. Some items may interact with your medicine.  This list may not describe all possible interactions. Give your health care provider a list of all the medicines, herbs, non-prescription drugs, or dietary supplements you use. Also tell them if you smoke, drink alcohol, or use illegal drugs. Some items may interact with your medicine.  What should I watch for while using this medicine?  You may need blood work done while you are taking this medicine.  If you are going to need a MRI, CT scan, or other procedure, tell your doctor that you are using this medicine (On-Body Injector only).  What side effects may I notice from receiving this medicine?  Side effects that you should report to   your doctor or health care professional as soon as possible:  -allergic reactions like skin rash, itching or hives, swelling of the face, lips, or tongue  -back pain  -dizziness  -fever  -pain, redness, or irritation at site where injected  -pinpoint red spots on the skin  -red or dark-brown urine  -shortness of breath or breathing problems  -stomach or side pain, or pain at the shoulder  -swelling  -tiredness  -trouble passing urine or  change in the amount of urine  Side effects that usually do not require medical attention (report to your doctor or health care professional if they continue or are bothersome):  -bone pain  -muscle pain  This list may not describe all possible side effects. Call your doctor for medical advice about side effects. You may report side effects to FDA at 1-800-FDA-1088.  Where should I keep my medicine?  Keep out of the reach of children.  If you are using this medicine at home, you will be instructed on how to store it. Throw away any unused medicine after the expiration date on the label.  NOTE: This sheet is a summary. It may not cover all possible information. If you have questions about this medicine, talk to your doctor, pharmacist, or health care provider.   2019 Elsevier/Gold Standard (2017-07-08 16:57:08)

## 2018-09-09 ENCOUNTER — Encounter (HOSPITAL_COMMUNITY): Payer: Self-pay | Admitting: *Deleted

## 2018-09-09 ENCOUNTER — Ambulatory Visit (HOSPITAL_COMMUNITY)
Admission: RE | Admit: 2018-09-09 | Discharge: 2018-09-09 | Disposition: A | Discharge status: Home / Self Care | Payer: BC Managed Care – PPO | Source: Ambulatory Visit | Attending: Internal Medicine | Admitting: Internal Medicine

## 2018-09-09 ENCOUNTER — Other Ambulatory Visit: Payer: Self-pay

## 2018-09-09 DIAGNOSIS — Z170 Estrogen receptor positive status [ER+]: Secondary | ICD-10-CM

## 2018-09-09 DIAGNOSIS — C50311 Malignant neoplasm of lower-inner quadrant of right female breast: Secondary | ICD-10-CM | POA: Diagnosis not present

## 2018-09-09 NOTE — Patient Instructions (Signed)
Your physician recommends that you schedule a follow-up appointment in: 3 months with echocardiogram  

## 2018-09-09 NOTE — Progress Notes (Signed)
Heart Failure TeleHealth Note  Due to national recommendations of social distancing due to [place] 19, Audio/video telehealth visit is felt to be most appropriate for this patient at this time.  See MyChart message from today for patient consent regarding telehealth for [facility].  Date:  09/09/2018   ID:  Mahari Vankirk, DOB 13-May-1975, MRN [id number]  Location: Home  Provider location: [facility] Type of Visit: New patient  PCP:  [name], MD  Cardiologist:  No primary care provider on file. Primary HF: [place]  Chief Complaint:  Breast cancer   History of Present Illness:  Ms. Urias is a 43 y/o female with h/o GERD, asthma and right breast CA referred by Dr. Annamaria Boots for enrollment in the Cardio-oncology clinic for echo surveillance during chemotherapy.    Cancer Staging Cancer of overlapping sites of right female breast [facility]) Staging form: Breast, AJCC 8th Edition - Clinical stage from 05/28/2018: Stage IB (cT2, cN1, cM0, G3, ER+, PR+, HER2+) - Signed by [name], MD on 05/28/2018  Malignant neoplasm of lower-outer quadrant of right breast of female, estrogen receptor positive ([place]) Staging form: Breast, AJCC 8th Edition - Clinical stage from 05/28/2018: Stage IIB (cT2, cN1, cM0, G3, ER+, PR+, HER2-) - Unsigned       Cancer of overlapping sites of right female breast ([place])   05/14/2018 Mammogram    Right breast mammogram 05/14/18  IMPRESSION  There is a 1.0 x 0.5 x 1.2 cm lobular hypoechoic mass right breast 7 o'clock position 3 cm from nipple, a 0.8 x 0.6 x 1.2 cm lobular hypoechoic mass right breast 7 o'clock position 2 cm from the nipple, and a 0.9 x 0.5 x 0.8 cm lobular hypoechoic mass right breast 5 o'clock position 3 cm from nipple.  There is a 3.8 x 1.0 x 1.3 cm lobular hypoechoic mass right breast 5 o'clock position 2 cm from nipple.  Multiple thickened right axillary lymph nodes (approximately 15). Measuring up  to 1.0 cm in thickness. Adenopathy corresponds with axillary palpable abnormality.  Indeterminate round and punctate calcifications upper-outer quadrant right breast.Indeterminate round and punctate calcifications upper-outer quadrant right breast.    05/21/2018 Initial Biopsy    Diagnosis 05/21/18  1. Breast, right, needle core biopsy, axilla, 5 o'clock, ribbon clip - INVASIVE MAMMARY CARCINOMA. - MAMMARY CARCINOMA IN SITU. 2. Breast, right, needle core biopsy, 7 o'clock, coil clip - INVASIVE MAMMARY CARCINOMA. - MAMMARY CARCINOMA IN SITU. 3. Lymph node, needle/core biopsy, right axillary LN, hydromark - METASTATIC MAMMARY CARCINOMA.     05/21/2018 Receptors her2    1. PROGNOSTIC INDICATORS Results: The tumor cells are EQUIVOCAL for Her2 (2+);  HER2 **NEGATIVE** by FISH   Estrogen Receptor: 80%, POSITIVE, MODERATE STAINING INTENSITY Progesterone Receptor: 5%, POSITIVE, MODERATE-WEAK STAINING INTENSITY Proliferation Marker Ki67: 10%  3. PROGNOSTIC INDICATORS Results: The tumor cells are POSITIVE for Her2 (3+). Estrogen Receptor: 90%, POSITIVE, STRONG STAINING INTENSITY Progesterone Receptor: 50%, POSITIVE, MODERATE STAINING INTENSITY Proliferation Marker Ki67: 5%    05/26/2018 Initial Diagnosis    Malignant neoplasm of lower-inner quadrant of right breast of female, estrogen receptor positive ([place])    05/27/2018 Mammogram    Left breast mammogram 05/27/18  IMPRESSION: No mammographic evidence of LEFT breast malignancy.    05/28/2018 Cancer Staging    Staging form: Breast, AJCC 8th Edition - Clinical stage from 05/28/2018: Stage IB (cT2, cN1, cM0, G3, ER+, PR+, HER2+) - Signed by [name], MD on 05/28/2018    06/03/2018 Imaging  MR BREAST BILATERAL W WO CONTRAST INC CAD  IMPRESSION: 1. Large enhancing masses and non mass enhancement identified throughout the majority of the right breast. Additionally, there are smaller masses identified within the  upper inner and lower inner right breast. Overall findings are concerning for extensive malignancy throughout all 4 quadrants of the right breast. 2. Extensive bulky lymphadenopathy identified within the right axilla. The matted appearance makes counting the number of abnormal lymph nodes difficult as they are immediately approximated to each other. 3. At least one abnormal appearing right internal mammary lymph node. 4. Nonenhancing lesion within the sternum with associated sternal marrow heterogeneity, raising the possibility of osseous metastatic disease within the sternum. 5. Two enhancing masses within the left breast as described above.      06/05/2018 PET scan    NM PET Image Initial (PI) Skull Base To Thigh  IMPRESSION: 1. Multiple borderline enlarged right axillary lymph nodes identified exhibiting mild to moderate increased uptake. Can not rule out nodal metastasis. Correlation with biopsy recommended. 2. Asymmetric increased uptake within the right breast corresponding to MRI abnormality. 3. Subcentimeter right retropectoral and right internal mammary lymph nodes are again identified and exhibit nonspecific, low level FDG uptake. 4. Noncalcified solid nodule within the lateral right lower lobe measures 9 mm without significant radiotracer uptake. 5. No hypermetabolic osseous lesions identified.      06/09/2018 -  Chemotherapy    Neoadjuvant chemo TCHP every 3 weeks     06/10/2018 Pathology Results    Surgical pathology  Diagnosis 1. Breast, left, needle core biopsy, LOQ, barbell - MILD FIBROCYSTIC CHANGES. 2. Breast, left, needle core biopsy, UIQ cylinder - MILD FIBROCYSTIC CHANGES.     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 There were no deleterious mutationsinAIP,  ALK, APC, ATM, AXIN2,BAP1, BARD1, BLM, BMPR1A, BRCA1, BRCA2, BRIP1, CASR, CDC73, CDH1, CDK4, CDKN1B, CDKN1C, CDKN2A (p14ARF), CDKN2A (p16INK4a), CEBPA, CHEK2, CTNNA1, DICER1, DIS3L2, EGFR (c.2369C>T, p.Thr790Met variant only), EPCAM (Deletion/duplication testing only), FH, FLCN, GATA2, GPC3, GREM1 (Promoter region deletion/duplication testing only), HOXB13 (c.251G>A, p.Gly84Glu), HRAS, KIT, MAX, MEN1, MET, MITF (c.952G>A, p.Glu318Lys variant only), MLH1, MSH2, MSH3, MSH6, MUTYH, NBN, NF1, NF2, NTHL1, PDGFRA, PHOX2B, PMS2, POLD1, POLE, POT1, PRKAR1A, PTCH1, PTEN, RAD50, RAD51C, RAD51D, RB1, RECQL4, RET, RUNX1, SDHAF2, SDHA (sequence changes only), SDHB, SDHC, SDHD, SMAD4, SMARCA4, SMARCB1, SMARCE1, STK11, SUFU, TERC, TERT, TMEM127, TP53, TSC1, TSC2, VHL, WRN and WT1. .     She presents via Engineer, civil (consulting) for a telehealth visit today. She is a Chartered certified accountant at [employer]. No h/o any cardiology issues. Has always been active. She started [facility] in 2/20. Tolerating well. By the 3rd week after therapy she is out running and doing all her activities. No HF symptoms. BP often runs in the 90s but asymptomatic.   Echo 05/30/18 EF 55-60% GLS - 27.1% Echo 08/28/18 EF 55-60% GLS -21.8%  Bobette Leyh denies symptoms worrisome for COVID 19.   Past Medical History:  Diagnosis Date  . Acid reflux   . Acute bronchitis   . Anemia   . Asthma   . Family history of brain cancer   . Family history of breast cancer   . Family history of prostate cancer   . Migraine    Past Surgical History:  Procedure Laterality Date  . BREAST BIOPSY Right 05/23/2018   x3, malignant  . DILATION AND CURETTAGE OF UTERUS  2006 x1, 2009 x2  . LAPAROTOMY  2008  . PORTACATH PLACEMENT Left 06/06/2018   Procedure: INSERTION PORT-A-CATH POSSIBLE ULTRASOUND;  Surgeon: [name], MD;  Location: [facility];  Service: General;  Laterality: Left;     Current Outpatient Medications  Medication Sig  Dispense Refill  . albuterol (PROVENTIL HFA;VENTOLIN HFA) 108 (90 BASE) MCG/ACT inhaler Inhale 2 puffs into the lungs every 6 (six) hours as needed for wheezing or shortness of breath.    . ALPRAZolam (XANAX) 0.5 MG tablet Take 0.5 mg by mouth daily as needed for anxiety. 3 to 5 times month    . calcium carbonate (TUMS - DOSED IN MG ELEMENTAL CALCIUM) 500 MG chewable tablet Chew 1 tablet by mouth daily as needed. heartburn    . cetirizine (ZYRTEC) 10 MG tablet Take 10 mg by mouth daily as needed for allergies.    Marland Kitchen dexamethasone (DECADRON) 4 MG tablet Take 1 tab twice daily, start the day before Taxotere. Take once the day after X3 days 30 tablet 1  . diphenoxylate-atropine (LOMOTIL) 2.5-0.025 MG tablet Take 1-2 tablets by mouth 4 (four) times daily as needed for diarrhea or loose stools. 40 tablet 0  . fish oil-omega-3 fatty acids 1000 MG capsule Take 1 g by mouth daily.     . Fluticasone Propionate (FLONASE ALLERGY RELIEF NA) Place into the nose.    . lidocaine-prilocaine (EMLA) cream Apply 1 application topically as needed. 30 g 0  . ondansetron (ZOFRAN) 8 MG tablet Take 1 tablet (8 mg total) by mouth every 8 (eight) hours as needed for nausea or vomiting. 20 tablet 0  . Probiotic Product (PROBIOTIC DAILY PO) Take 1 capsule by mouth daily.     . prochlorperazine (COMPAZINE) 10 MG tablet Take 1 tablet (10 mg total) by mouth every 6 (six) hours as needed for nausea or vomiting. 30 tablet 0  . propranolol (INDERAL) 10 MG tablet Take 5 mg by mouth daily as needed. Takes as needed for shaking hand less than 10 times per year     No current facility-administered medications for this encounter.     Allergies:   Patient has no known allergies.   Social History:  The patient  reports that she has never smoked. She has never used smokeless tobacco. She reports current alcohol use. She reports that she does not use drugs.   Family History:  The patient's family history includes Breast cancer in an  other family member; Breast cancer (age of onset: 56) in her maternal great-grandmother; Cancer in her maternal aunt; Cancer (age of onset: 15) in her sister; Cancer - Other in an other family member; Hyperlipidemia in her father; Prostate cancer (age of onset: 59) in her maternal uncle; Prostate cancer (age of onset: 49) in her maternal grandfather; Skin cancer in her maternal grandmother.   ROS:  Please see the history of present illness.   All other systems are personally reviewed and negative.   Exam:  (Video/Tele Health Call; Exam is subjective and or/visual.) General:  Speaks in full sentences. No resp difficulty. Lungs: Normal respiratory effort with conversation.  Abdomen: Non-distended per patient report Extremities: Pt denies edema. Neuro: Alert & oriented x 3.   Recent Labs: 09/01/2018: ALT 24; BUN 13; Creatinine 0.71; Hemoglobin 10.2; Platelet Count 190; Potassium 4.0; Sodium 139  Personally reviewed   Wt Readings from Last 3 Encounters:  09/01/18 61.6 kg (135 lb 11.2 oz)  08/11/18 60.7 kg (133 lb 12.8 oz)  07/21/18 62.4  kg (137 lb 8 oz)      ASSESSMENT AND PLAN:  1.  Breast CA - - Explained incidence of Herceptin cardiotoxicity and role of Cardio-oncology clinic at length. Echo images reviewed personally. All parameters stable. Reviewed signs and symptoms of HF to look for. Continue Herceptin. Follow-up with echo in 3 months.  COVID screen The patient does not have any symptoms that suggest any further testing/ screening at this time.  Social distancing reinforced today.  Recommended follow-up:  As above  Relevant cardiac medications were reviewed at length with the patient today.   The patient does not have concerns regarding their medications at this time.   The following changes were made today:  As above  Today, I have spent 16 minutes with the patient with telehealth technology discussing the above issues .    Signed, Glori Bickers, MD  09/09/2018 2:27 PM   [facility] [place] [address] [facility] [place] [address] [phone] (office) [phone] (fax)

## 2018-09-09 NOTE — Progress Notes (Signed)
Order placed, message to schedulers to arrange, AVS sent via mychart.

## 2018-09-09 NOTE — Addendum Note (Signed)
Encounter addended by: Scarlette Calico, RN on: 09/09/2018 3:31 PM  Actions taken: Order list changed, Diagnosis association updated, Clinical Note Signed

## 2018-09-16 ENCOUNTER — Telehealth: Payer: Self-pay

## 2018-09-16 NOTE — Telephone Encounter (Signed)
Patient calls for advice.  She states she has a sore on one of her fingers. Has been there for three days, painful to touch, skin is intact but not going away.  Also has several cyst like areas that have developed in the vulvar region which seem to be getting better. These areas were not painful.   [phone]  Message given to Cira Rue NP for review.

## 2018-09-17 ENCOUNTER — Telehealth: Payer: Self-pay | Admitting: Emergency Medicine

## 2018-09-17 NOTE — Telephone Encounter (Signed)
Pt called reporting a possible infection of her finger, recently sent request about sores on hand and possible sores in vulvar region.  Pt denies F/C or resp symptoms or exposure to possible Covid-19.  Denies N/V/D.  Spoke to [facility] NP about pt's concerns, agreed to visit in [facility] for an assessment by PA Lucianne Lei.  PT VU and agreement to appt at 1pm on 09/18/2018.  Scheduling message sent.

## 2018-09-18 ENCOUNTER — Other Ambulatory Visit: Payer: Self-pay | Admitting: Hematology

## 2018-09-18 ENCOUNTER — Other Ambulatory Visit: Payer: Self-pay | Admitting: Medical

## 2018-09-18 ENCOUNTER — Other Ambulatory Visit: Payer: Self-pay

## 2018-09-18 ENCOUNTER — Inpatient Hospital Stay: Payer: BC Managed Care – PPO | Attending: Hematology | Admitting: Medical

## 2018-09-18 VITALS — BP 102/64 | HR 60 | Temp 99.1°F | Resp 17 | Ht 69.0 in | Wt 139.3 lb

## 2018-09-18 DIAGNOSIS — Z170 Estrogen receptor positive status [ER+]: Secondary | ICD-10-CM

## 2018-09-18 DIAGNOSIS — M7989 Other specified soft tissue disorders: Secondary | ICD-10-CM | POA: Insufficient documentation

## 2018-09-18 DIAGNOSIS — C50811 Malignant neoplasm of overlapping sites of right female breast: Secondary | ICD-10-CM | POA: Diagnosis not present

## 2018-09-18 DIAGNOSIS — L089 Local infection of the skin and subcutaneous tissue, unspecified: Secondary | ICD-10-CM | POA: Insufficient documentation

## 2018-09-18 DIAGNOSIS — Z5112 Encounter for antineoplastic immunotherapy: Secondary | ICD-10-CM | POA: Insufficient documentation

## 2018-09-18 DIAGNOSIS — Z5111 Encounter for antineoplastic chemotherapy: Secondary | ICD-10-CM | POA: Insufficient documentation

## 2018-09-18 DIAGNOSIS — R253 Fasciculation: Secondary | ICD-10-CM | POA: Insufficient documentation

## 2018-09-18 DIAGNOSIS — J45909 Unspecified asthma, uncomplicated: Secondary | ICD-10-CM | POA: Insufficient documentation

## 2018-09-18 DIAGNOSIS — R911 Solitary pulmonary nodule: Secondary | ICD-10-CM | POA: Insufficient documentation

## 2018-09-18 DIAGNOSIS — C50311 Malignant neoplasm of lower-inner quadrant of right female breast: Secondary | ICD-10-CM

## 2018-09-18 DIAGNOSIS — Z5189 Encounter for other specified aftercare: Secondary | ICD-10-CM | POA: Insufficient documentation

## 2018-09-18 DIAGNOSIS — D6481 Anemia due to antineoplastic chemotherapy: Secondary | ICD-10-CM | POA: Insufficient documentation

## 2018-09-18 MED ORDER — MUPIROCIN CALCIUM 2 % EX CREA: 1.0000 "application " | TOPICAL_CREAM | Freq: Three times a day (TID) | CUTANEOUS | 1 refills | Status: DC

## 2018-09-18 MED ORDER — CEPHALEXIN 500 MG PO CAPS: 500.0000 mg | ORAL_CAPSULE | Freq: Four times a day (QID) | ORAL | 0 refills | Status: DC

## 2018-09-18 NOTE — Progress Notes (Signed)
[place]   [phone] [phone]) [phone]   Clinic Follow up Note   Patient Care Team: [name], MD as PCP - General (Family Medicine) [name], MD as Consulting Physician (General Surgery) [name], MD as Consulting Physician (Hematology) [name], MD as Consulting Physician (Radiation Oncology) [name], MD as Consulting Physician (Obstetrics and Gynecology) [name], RN as Oncology Nurse Navigator [name], RN as Oncology Nurse Navigator  Date of Service:  09/22/2018  CHIEF COMPLAINT: F/u multifocal right breast cancer  SUMMARY OF ONCOLOGIC HISTORY: Oncology History   Cancer Staging Cancer of overlapping sites of right female breast [facility]) Staging form: Breast, AJCC 8th Edition - Clinical stage from 05/28/2018: Stage IB (cT2, cN1, cM0, G3, ER+, PR+, HER2+) - Signed by [name], MD on 05/28/2018  Malignant neoplasm of lower-outer quadrant of right breast of female, estrogen receptor positive ([place]) Staging form: Breast, AJCC 8th Edition - Clinical stage from 05/28/2018: Stage IIB (cT2, cN1, cM0, G3, ER+, PR+, HER2-) - Unsigned       Cancer of overlapping sites of right female breast ([place])   05/14/2018 Mammogram    Right breast mammogram 05/14/18  IMPRESSION  There is a 1.0 x 0.5 x 1.2 cm lobular hypoechoic mass right breast 7 o'clock position 3 cm from nipple, a 0.8 x 0.6 x 1.2 cm lobular hypoechoic mass right breast 7 o'clock position 2 cm from the nipple, and a 0.9 x 0.5 x 0.8 cm lobular hypoechoic mass right breast 5 o'clock position 3 cm from nipple.  There is a 3.8 x 1.0 x 1.3 cm lobular hypoechoic mass right breast 5 o'clock position 2 cm from nipple.  Multiple thickened right axillary lymph nodes (approximately 15). Measuring up to 1.0 cm in thickness. Adenopathy corresponds with axillary palpable abnormality.  Indeterminate round and punctate calcifications upper-outer quadrant right  breast.Indeterminate round and punctate calcifications upper-outer quadrant right breast.    05/21/2018 Initial Biopsy    Diagnosis 05/21/18  1. Breast, right, needle core biopsy, axilla, 5 o'clock, ribbon clip - INVASIVE MAMMARY CARCINOMA. - MAMMARY CARCINOMA IN SITU. 2. Breast, right, needle core biopsy, 7 o'clock, coil clip - INVASIVE MAMMARY CARCINOMA. - MAMMARY CARCINOMA IN SITU. 3. Lymph node, needle/core biopsy, right axillary LN, hydromark - METASTATIC MAMMARY CARCINOMA.     05/21/2018 Receptors her2    1. PROGNOSTIC INDICATORS Results: The tumor cells are EQUIVOCAL for Her2 (2+);  HER2 **NEGATIVE** by FISH   Estrogen Receptor: 80%, POSITIVE, MODERATE STAINING INTENSITY Progesterone Receptor: 5%, POSITIVE, MODERATE-WEAK STAINING INTENSITY Proliferation Marker Ki67: 10%  3. PROGNOSTIC INDICATORS Results: The tumor cells are POSITIVE for Her2 (3+). Estrogen Receptor: 90%, POSITIVE, STRONG STAINING INTENSITY Progesterone Receptor: 50%, POSITIVE, MODERATE STAINING INTENSITY Proliferation Marker Ki67: 5%    05/26/2018 Initial Diagnosis    Malignant neoplasm of lower-inner quadrant of right breast of female, estrogen receptor positive ([place])    05/27/2018 Mammogram    Left breast mammogram 05/27/18  IMPRESSION: No mammographic evidence of LEFT breast malignancy.    05/28/2018 Cancer Staging    Staging form: Breast, AJCC 8th Edition - Clinical stage from 05/28/2018: Stage IB (cT2, cN1, cM0, G3, ER+, PR+, HER2+) - Signed by [name], MD on 05/28/2018    06/03/2018 Imaging    MR BREAST BILATERAL W WO CONTRAST INC CAD  IMPRESSION: 1. Large enhancing masses and non mass enhancement identified throughout the majority of the right breast. Additionally, there are smaller masses identified within the upper inner and lower  inner right breast. Overall findings are concerning for extensive malignancy throughout all 4 quadrants of the right breast. 2. Extensive bulky lymphadenopathy  identified within the right axilla. The matted appearance makes counting the number of abnormal lymph nodes difficult as they are immediately approximated to each other. 3. At least one abnormal appearing right internal mammary lymph node. 4. Nonenhancing lesion within the sternum with associated sternal marrow heterogeneity, raising the possibility of osseous metastatic disease within the sternum. 5. Two enhancing masses within the left breast as described above.      06/05/2018 PET scan    NM PET Image Initial (PI) Skull Base To Thigh  IMPRESSION: 1. Multiple borderline enlarged right axillary lymph nodes identified exhibiting mild to moderate increased uptake. Can not rule out nodal metastasis. Correlation with biopsy recommended. 2. Asymmetric increased uptake within the right breast corresponding to MRI abnormality. 3. Subcentimeter right retropectoral and right internal mammary lymph nodes are again identified and exhibit nonspecific, low level FDG uptake. 4. Noncalcified solid nodule within the lateral right lower lobe measures 9 mm without significant radiotracer uptake. 5. No hypermetabolic osseous lesions identified.      06/09/2018 -  Chemotherapy    Neoadjuvant chemotherapyTCHP, 06/09/2018-09/22/18. Followed by Maintenance Herceptin/Perjeta q3weeks starting 10/13/18 to continue 1 year treatment.     06/10/2018 Pathology Results    Surgical pathology  Diagnosis 1. Breast, left, needle core biopsy, LOQ, barbell - MILD FIBROCYSTIC CHANGES. 2. Breast, left, needle core biopsy, UIQ cylinder - MILD FIBROCYSTIC CHANGES.     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 There were no deleterious mutations in AIP, ALK, APC, ATM, AXIN2,BAP1,  BARD1, BLM, BMPR1A, BRCA1, BRCA2, BRIP1, CASR, CDC73, CDH1, CDK4,  CDKN1B, CDKN1C, CDKN2A (p14ARF), CDKN2A (p16INK4a), CEBPA, CHEK2, CTNNA1, DICER1, DIS3L2, EGFR (c.2369C>T, p.Thr790Met variant only), EPCAM (Deletion/duplication testing only), FH, FLCN, GATA2, GPC3, GREM1 (Promoter region deletion/duplication testing only), HOXB13 (c.251G>A, p.Gly84Glu), HRAS, KIT, MAX, MEN1, MET, MITF (c.952G>A, p.Glu318Lys variant only), MLH1, MSH2, MSH3, MSH6, MUTYH, NBN, NF1, NF2, NTHL1, PDGFRA, PHOX2B, PMS2, POLD1, POLE, POT1, PRKAR1A, PTCH1, PTEN, RAD50, RAD51C, RAD51D, RB1, RECQL4, RET, RUNX1, SDHAF2, SDHA (sequence changes only), SDHB, SDHC, SDHD, SMAD4, SMARCA4, SMARCB1, SMARCE1, STK11, SUFU, TERC, TERT, TMEM127, TP53, TSC1, TSC2, VHL, WRN and WT1.  Marland Kitchen      CURRENT THERAPY:  Neoadjuvant chemotherapyTCHP, 06/09/2018-09/22/18. Followed by Maintenance Herceptin/Perjeta q3weeks starting 10/13/18 to continue 1 year treatment.   INTERVAL HISTORY:  Kim [name] is here for a follow up and treatment. She presents to the clinic alone. She had a cut on her right index finger with pus filled center and blistered which she saw [facility] for. She is on Keflex until 6/11. This improved with 3-4 doses.  With last cycle chemo she had increased fatigued which was not significant. This improved as time went but was not gone by week 3. She notes having more b/l sore and swollen legs which resolves by morning. She denies neuropathy except for a hint of nubness for 1 day. She is taking a lot of miralax to control her constipation. She has a breast MRI at [facility] tomorrow to determine her surgery and radiation route.  She notes her mother was found to be triple negative in her breast cancer and she will liekly get b/l mastectomy.    REVIEW OF SYSTEMS:   Constitutional: Denies fevers, chills or abnormal weight loss (+)  more fatigue Eyes: Denies blurriness of vision Ears, nose, mouth, throat, and face: Denies mucositis or sore throat Respiratory: Denies cough, dyspnea or wheezes Cardiovascular: Denies  palpitation, chest discomfort or lower extremity swelling Gastrointestinal:  Denies nausea, heartburn (+) control her constipation.  Skin: Denies abnormal skin rashes (+) right index finger cut with infection, improving.  Lymphatics: Denies new lymphadenopathy or easy bruising Neurological:Denies numbness, tingling or new weaknesses Behavioral/Psych: Mood is stable, no new changes  All other systems were reviewed with the patient and are negative.  MEDICAL HISTORY:  Past Medical History:  Diagnosis Date  . Acid reflux   . Acute bronchitis   . Anemia   . Asthma   . Family history of brain cancer   . Family history of breast cancer   . Family history of prostate cancer   . Migraine     SURGICAL HISTORY: Past Surgical History:  Procedure Laterality Date  . BREAST BIOPSY Right 05/23/2018   x3, malignant  . DILATION AND CURETTAGE OF UTERUS     2006 x1, 2009 x2  . LAPAROTOMY  2008  . PORTACATH PLACEMENT Left 06/06/2018   Procedure: INSERTION PORT-A-CATH POSSIBLE ULTRASOUND;  Surgeon: [name], MD;  Location: [place];  Service: General;  Laterality: Left;    I have reviewed the social history and family history with the patient and they are unchanged from previous note.  ALLERGIES:  has No Known Allergies.  MEDICATIONS:  Current Outpatient Medications  Medication Sig Dispense Refill  . albuterol (PROVENTIL HFA;VENTOLIN HFA) 108 (90 BASE) MCG/ACT inhaler Inhale 2 puffs into the lungs every 6 (six) hours as needed for wheezing or shortness of breath.    . ALPRAZolam (XANAX) 0.5 MG tablet Take 0.5 mg by mouth daily as needed for anxiety. 3 to 5 times month    . calcium carbonate (TUMS - DOSED IN MG ELEMENTAL CALCIUM) 500 MG chewable tablet Chew 1 tablet by mouth daily as needed. heartburn    . cephALEXin (KEFLEX) 500 MG capsule Take 1 capsule (500 mg total) by mouth 4 (four) times daily. 28 capsule 0  . cetirizine (ZYRTEC) 10 MG tablet Take 10 mg by mouth  daily as needed for allergies.    Marland Kitchen dexamethasone (DECADRON) 4 MG tablet Take 1 tab twice daily, start the day before Taxotere. Take once the day after X3 days 30 tablet 1  . diphenoxylate-atropine (LOMOTIL) 2.5-0.025 MG tablet Take 1-2 tablets by mouth 4 (four) times daily as needed for diarrhea or loose stools. 40 tablet 0  . fish oil-omega-3 fatty acids 1000 MG capsule Take 1 g by mouth daily.     . Fluticasone Propionate (FLONASE ALLERGY RELIEF NA) Place into the nose.    . lidocaine-prilocaine (EMLA) cream Apply 1 application topically as needed. 30 g 0  . mupirocin cream (BACTROBAN) 2 % Apply 1 application topically 3 (three) times daily. 30 g 1  . ondansetron (ZOFRAN) 8 MG tablet Take 1 tablet (8 mg total) by mouth every 8 (eight) hours as needed for nausea or vomiting. 20 tablet 0  . Probiotic Product (PROBIOTIC DAILY PO) Take 1 capsule by mouth daily.     . prochlorperazine (COMPAZINE) 10 MG tablet TAKE 1 TABLET (10 MG TOTAL) BY MOUTH EVERY 6 (SIX) HOURS AS NEEDED FOR NAUSEA OR VOMITING. 30 tablet 0  . propranolol (INDERAL) 10 MG tablet Take 5 mg by mouth daily as needed. Takes as needed for shaking hand less than 10 times per year  No current facility-administered medications for this visit.     PHYSICAL EXAMINATION: ECOG PERFORMANCE STATUS: 1 - Symptomatic but completely ambulatory  Vitals:   09/22/18 0840  BP: (!) 97/58  Pulse: (!) 52  Resp: 17  Temp: 98.7 F (37.1 C)  SpO2: 100%   Filed Weights   09/22/18 0840  Weight: 138 lb 8 oz (62.8 kg)    GENERAL:alert, no distress and comfortable SKIN: skin color, texture, turgor are normal, no rashes or significant lesions EYES: normal, Conjunctiva are pink and non-injected, sclera clear  NECK: supple, thyroid normal size, non-tender, without nodularity LYMPH:  no palpable lymphadenopathy in the cervical, axillary  LUNGS: clear to auscultation and percussion with normal breathing effort HEART: regular rate & rhythm and no  murmurs and no lower extremity edema ABDOMEN:abdomen soft, non-tender and normal bowel sounds Musculoskeletal:no cyanosis of digits and no clubbing  NEURO: alert & oriented x 3 with fluent speech, no focal motor/sensory deficits  LABORATORY DATA:  I have reviewed the data as listed CBC Latest Ref Rng & Units 09/22/2018 09/01/2018 08/11/2018  WBC 4.0 - 10.5 K/uL 3.1(L) 5.0 4.4  Hemoglobin 12.0 - 15.0 g/dL 9.7(L) 10.2(L) 10.4(L)  Hematocrit 36.0 - 46.0 % 29.8(L) 31.5(L) 32.1(L)  Platelets 150 - 400 K/uL 169 190 214     CMP Latest Ref Rng & Units 09/22/2018 09/01/2018 08/11/2018  Glucose 70 - 99 mg/dL 73 84 73  BUN 6 - 20 mg/dL _0 Creatinine 0.44 - 1.00 mg/dL 0.70 0.71 0.72  Sodium 135 - 145 mmol/L 140 139 138  Potassium 3.5 - 5.1 mmol/L 3.4(L) 4.0 4.1  Chloride 98 - 111 mmol/L 103 102 102  CO2 22 - 32 mmol/L _1 Calcium 8.9 - 10.3 mg/dL 9.1 9.5 9.6  Total Protein 6.5 - 8.1 g/dL 6.4(L) 6.6 7.0  Total Bilirubin 0.3 - 1.2 mg/dL 0.5 0.6 0.6  Alkaline Phos 38 - 126 U/L 55 56 67  AST 15 - 41 U/L 14(L) 11(L) 10(L)  ALT 0 - 44 U/L _2 RADIOGRAPHIC STUDIES: I have personally reviewed the radiological images as listed and agreed with the findings in the report. No results found.   ASSESSMENT & PLAN:  Abiha Lukehart is a 43 y.o. 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 [place] also showed 15abnormallymph nodes. -There is discrepancy of her to test results between breast biopsy and axillary lymph node biopsy,we plan to repeat HER2 on her surgical sample. -She was seen by Med Onc Dr. Hiram Comber at [facility] in 05/2018 for second opinion.  Theyrecommendedshe proceed with her current neoadjuvant chemoregimen. -ShehasstartedneoadjuvantTCHP on 2/24/20with Udenyca on day 3. She tolerates well with mild nausea, constipation (controlled), progressingdry skin of handsand feet with cracking and 1 instance of infection, and progressing fatigue.  -By cycle 4 her right breast mass and right axillary LN was no longer palpable.  -Labs reviewed, WBC 3.1, Hg 9.7, ANC 1.4, CMP WNL except K3.4 and slightly low protein level. Overall adeqaute to proceed with last cycle today.  -She will proceed with Breast MRI at [facility] tomorrow to determine her surgery and radiation route.  -I will repat CT chest to monitor her lung nodule.  -I disucssed after  completeing TCHP, she will proced with maintenance Herceptin/Perjeta starting 6/29. If she has significant residual cancer on her surgical sample, will change to TDM-1 ( [place]) in the adjuvant setting.  -she has not decided where to get adjuvant radiation yet  -f/u in 3 weeks    2.Genetics:PALB44mtation+ -Genetic testingfrom [facility] done 05/2018 was positive for PALB210mation. She has discovered a stronger maternal family history of prostate and breast cancerin her 2nd and 3rd degree relatives. -We discussed the risk of future breast cancer from PALB2m58mtion, which depends on her family history.I thinkit isveryreasonable to proceed with b/l breast mastectomy given her positive family history of cancers. She is agreeable and is considering reconstruction.She has not determined where to have surgery and reconstruction. -Wepreviouslyreviewed the risk for other cancers in carriers for PALB2 mutations, including female breast cancer, prostate, ovarian and pancreatic cancer, although these risks have not yet been quantified. While there is an increased risk for other cancers, there are no current guidelines for screening for these cancers, and at this time NCCN does not suggest a discussion of risk  reducing salpingo-oophorectomy. -Her mother, 73,65as recently diagnosed with breast cancer triple negative, probably PALB2 mutation related. She will proceed with b/l mastectomy in 09/2018.    3. Asthma  -mild, continuealbuterol inhaler as needed -I reviewedsheis higherrisk for infection. I discussedinfection precautions and advised herto avoid unnecessary public venues.  -If she develops fever or productive cough with phlegm she can contact our clinic. -stable  4. Diet and fasting  -Pt's husband found some literature of fasting for cancer and chemo, pt may want to try fasting around chemo  -f/u with dietician -She is still fasting 72 hoursaround her chemo.She did lose 6 poundswithher first cycle of chemo,but has been eating better and was able to gain weight.  -I encouraged her to balance working out and eating adequately on week 3 to recover well. -Will monitor. She has been able to gain weight lately.   5. Allergy Symptoms  -She had a recent significant episode of sinus congestion and drainage. Has improved recently.  -I encouraged her to useClaritin,Flonase and continue use of inhaler as needed.  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 9mm34mL lung nodule  -Her initial staging PET scan showed a 9 mm solid rounded nodule within the lateral right lower lobe, not hypermetabolic -will repeat CT chest before next visit.   8. Right index finger skin infection -[facility] on [facility] antibiotics -much better now   Plan -Labs reviewedand adequate to proceedwithcycle6TCHPtoday with Udenyca on day 3 -Lab, flush, f/u and Herceptin/Perjeta in 3 weeks -CT chest wo contrast in 2-3 weeks  [facility]u with her surgeon after MRI     No problem-specific Assessment & Plan notes found for this encounter.   Orders Placed This Encounter  Procedures  . CT Chest Wo Contrast    Standing Status:   Future    Standing Expiration Date:   09/22/2019    Order Specific Question:   Is patient pregnant?    Answer:   No    Order Specific Question:   [facility]?    [facility]:   Radiology Contrast Protocol - do  NOT remove file path    Answer:   _0 [email or web address]   All questions were answered. The patient knows to call the clinic with any problems, questions or concerns. No barriers to learning was detected. I spent 20 minutes counseling the patient face to face. The total time spent in the appointment was 25 minutes and more than 50% was on counseling and review of test results     [name], MD 09/22/2018   I, Joslyn Devon, am acting as scribe for [name], MD.   I have reviewed the above documentation for accuracy and completeness, and I agree with the above.

## 2018-09-18 NOTE — Patient Instructions (Signed)
Abrasion    An abrasion is a cut or a scrape on the surface of your skin. An abrasion does not go through all the layers of your skin. It is important to take good care of your abrasion to prevent infection.  Follow these instructions at home:  Medicines  · Take or apply over-the-counter and prescription medicines only as told by your doctor.  · If you were prescribed an antibiotic medicine, apply it as told by your doctor. Do not stop using the antibiotic even if you start to feel better.  Wound care  · Clean the wound 2-3 times a day or as often as told by your doctor. To do this:  ? Wash the wound with mild soap and water.  ? Rinse off the soap.  ? Pat a clean towel on the wound to dry it. Do not rub it.  · Keep the bandage (dressing) clean and dry as told by your doctor.  · Follow instructions from your doctor about how to take care of your wound. Make sure you:  ? Wash your hands with soap and water before you change your bandage. If you cannot use soap and water, use hand sanitizer.  ? Change your bandage as told by your doctor.  · Check your wound every day for signs of infection. Check for:  ? Redness, swelling, or pain.  ? Fluid or blood.  ? Warmth.  ? Pus or a bad smell.  · If directed, put ice on the injured area. To do this:  ? Put ice in a plastic bag.  ? Place a towel between your skin and the bag.  ? Leave the ice on for 20 minutes, 2-3 times a day.  General instructions  · Do not take baths, swim, or use a hot tub until your doctor says it is okay.  · If there is swelling, raise (elevate) the injured area above the level of your heart while you are sitting or lying down.  · Keep all follow-up visits as told by your doctor. This is important.  Contact a doctor if:  · You were given a tetanus shot, and you have any of these where the needle went in:  ? Swelling.  ? Very bad pain.  ? Redness.  ? Bleeding.  · You have a lot of pain, and medicine does not help.  · You have any of these at the site of the  wound:  ? More redness.  ? More swelling.  ? More pain.  Get help right away if:  · You have a red streak going away from your wound.  · You have a fever.  · You have fluid, blood, or pus coming from your wound.  · There is a bad smell coming from your wound or bandage.  Summary  · An abrasion is a cut or a scrape on the surface of your skin.  · Take good care of your abrasion so it does not get infected.  · Clean the wound with mild soap and water, and change your bandage as told by your doctor.  · Call your doctor if you have redness, swelling, or more pain in your wound.  · Get help right away if you have a fever or if you have fluid, blood, pus, a bad smell, or a red streak coming from the wound.  This information is not intended to replace advice given to you by your health care provider. Make sure you discuss any questions 

## 2018-09-18 NOTE — Progress Notes (Signed)
Pt presents with swollen red tender area on inside of right index finger.  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  Reports that the spot occurred a few days ago and has been changing since, growing redder with some white discharge.  Afebrile.  Denies any other concerns or symptoms of infection.  Asked pt about recent concerns of two hardened pea sized bumps beneath the skin around labia majora.  Pt states that they appeared to be cyst-like and similar to ingrown hairs, and that since they have been decreased in size and are not painful she is not concerned about them.  PA Lucianne Lei made aware.

## 2018-09-18 NOTE — Progress Notes (Signed)
[facility] Progress Note   Kim [name] [id number] [birth year] 43 y.o.  Deeksha Cotrell is managed by Dr. [name]  Actively treated with chemotherapy/immunotherapy/hormonal therapy: yes  Current therapy:  TCHP  Last treated: 09/01/2018 (cycle #5)  Next scheduled appointment with provider: 09/22/2018  Assessment: Plan:    Skin infection - Plan: cephALEXin (KEFLEX) 500 MG capsule, mupirocin cream (BACTROBAN) 2 %  Malignant neoplasm of overlapping sites of right breast in female, estrogen receptor positive ([place])   Cancer of overlapping sites of right female breast: Ms. Raglin continues to be managed by Dr. [name] and is status post cycle #5 of TCHP which was dosed on 09/01/2018.  She is scheduled to return for next treatment on 09/22/2018.  Skin infection of the right palmar index finger: Patient was given a prescription for Keflex 500 mg p.o. 4 times daily x7 days and a prescription for Bactroban 2% cream to use to the area 3 times daily.  Please see After Visit Summary for patient specific instructions.  Future Appointments  Date Time Provider [place]  09/22/2018  8:15 AM [facility] LAB 2 [facility] None  09/22/2018  8:30 AM [facility] [place] FLUSH [facility] None  09/22/2018  9:00 AM [name], MD [facility] None  09/22/2018 10:00 AM [facility] INFUSION [facility] None  12/10/2018  9:00 AM [facility] OP 1 [facility]  12/10/2018 10:00 AM Bensimhon, Shaune Pascal, MD [facility] None    No orders of the defined types were placed in this encounter.      Subjective:   Patient ID:  Kim [name] is a 43 y.o. (DOB [birth year]) female.  Chief Complaint:  Chief Complaint  Patient presents with  . Wound Check    HPI Kim [name]  Is a 43 year old female with a diagnosis of a cancer of overlapping sites in her right female breast. She is managed by Dr. [name] and is status post cycle #5 of TCHP which was dosed on 09/01/2018. She contacted our office on  09/16/2018 reporting that she has a sore on one of her fingers which has been there for three days and is painful to touch. The skin is intact but the area is not going away.  She also has several cyst like areas that have developed in the vulvar region which seem to be getting better. These areas were not painful. She is scheduled for her next cycle of chemotherapy on 09/22/2018.  The patient is a violinist and reports that she often will have small cuts and abrasions her fingers.  She denies any remembrance of having any injury to her right palmar index finger recently.  She reports that the area has on her labia have gotten smaller and have nearly resolved.  She reports that she believes that these could have been small infected hair follicles or sores.  She denies fevers, chills, or sweats.  Medications: I have reviewed the patient's current medications.  Allergies: No Known Allergies  Past Medical History:  Diagnosis Date  . Acid reflux   . Acute bronchitis   . Anemia   . Asthma   . Family history of brain cancer   . Family history of breast cancer   . Family history of prostate cancer   . Migraine     Past Surgical History:  Procedure Laterality Date  . BREAST BIOPSY Right 05/23/2018   x3, malignant  . DILATION AND CURETTAGE OF UTERUS     2006 x1, 2009 x2  . LAPAROTOMY  2008  .  PORTACATH PLACEMENT Left 06/06/2018   Procedure: INSERTION PORT-A-CATH POSSIBLE ULTRASOUND;  Surgeon: [name], MD;  Location: [place];  Service: General;  Laterality: Left;    Family History  Problem Relation Age of Onset  . Hyperlipidemia Father   . Cancer Sister 43       Astrocytoma and Meningioma  . Cancer Maternal Aunt        pitutory cancer  . Breast cancer Maternal Great-grandmother 40       MGFs mother  . Prostate cancer Maternal Grandfather 15  . Prostate cancer Maternal Uncle 56  . Skin cancer Maternal Grandmother   . Cancer - Other Other        pituitary, MGMs  sister, died in her 20s  . Breast cancer Other        PGFs sister    Social History   Socioeconomic History  . Marital status: Married    Spouse name: Not on file  . Number of children: 3  . Years of education: Not on file  . Highest education level: Not on file  Occupational History  . Not on file  Social Needs  . Financial resource strain: Not on file  . Food insecurity:    Worry: Not on file    Inability: Not on file  . Transportation needs:    Medical: Not on file    Non-medical: Not on file  Tobacco Use  . Smoking status: Never Smoker  . Smokeless tobacco: Never Used  Substance and Sexual Activity  . Alcohol use: Yes    Comment: 3-4 drinks a week, wine and beer   . Drug use: No  . Sexual activity: Not Currently    Birth control/protection: I.U.D.  Lifestyle  . Physical activity:    Days per week: Not on file    Minutes per session: Not on file  . Stress: Not on file  Relationships  . Social connections:    Talks on phone: Not on file    Gets together: Not on file    Attends religious service: Not on file    Active member of club or organization: Not on file    Attends meetings of clubs or organizations: Not on file    Relationship status: Not on file  . Intimate partner violence:    Fear of current or ex partner: Not on file    Emotionally abused: Not on file    Physically abused: Not on file    Forced sexual activity: Not on file  Other Topics Concern  . Not on file  Social History Narrative  . Not on file    Past Medical History, Surgical history, Social history, and Family history were reviewed and updated as appropriate.   Please see review of systems for further details on the patient's review from today.   Review of Systems:  Review of Systems  Constitutional: Negative for chills, diaphoresis and fever.  HENT: Negative for facial swelling, trouble swallowing and voice change.   Respiratory: Negative for cough, chest tightness, shortness of  breath and wheezing.   Cardiovascular: Negative for chest pain and palpitations.  Gastrointestinal: Negative for abdominal pain, constipation, diarrhea, nausea and vomiting.  Musculoskeletal: Negative for back pain and myalgias.  Skin: Positive for color change.       Area of erythema and small blister like lesion over the right palmar distal index finger.  The area had drained a small amount of clear fluid earlier today and is tender to  the touch.  Patient also reports having small pustules in her labia majora which she believes may be secondary to infected hair follicles for infected pores.  These areas have gotten smaller and nearly resolved.  Neurological: Negative for dizziness, light-headedness and headaches.    Objective:   Physical Exam:  BP 102/64 (BP Location: Left Arm, Patient Position: Sitting)   Pulse 60   Temp 99.1 F (37.3 C) (Oral)   Resp 17   Ht 5\' 9"  (1.753 m)   Wt 139 lb 4.8 oz (63.2 kg)   SpO2 100%   BMI 20.57 kg/m  ECOG: 0  Physical Exam Constitutional:      General: She is not in acute distress.    Appearance: She is not diaphoretic.  HENT:     Head: Normocephalic and atraumatic.  Eyes:     General: No scleral icterus.       Right eye: No discharge.        Left eye: No discharge.     Conjunctiva/sclera: Conjunctivae normal.  Cardiovascular:     Rate and Rhythm: Normal rate and regular rhythm.     Heart sounds: Normal heart sounds. No murmur. No friction rub. No gallop.   Pulmonary:     Effort: Pulmonary effort is normal. No respiratory distress.     Breath sounds: Normal breath sounds. No wheezing or rales.  Skin:    General: Skin is warm and dry.     Findings: Erythema and lesion present. No rash.     Comments: 1.3 x 0.5 cm area of erythema over the right distal palmar index finger with a 0.5 x 0.3 cm blister with a small opening.  The area is tender.  No increased warmth is appreciated.  No exudate is noted.  Neurological:     Mental Status: She  is alert.     Coordination: Coordination normal.     Gait: Gait normal.        Lab Review:     Component Value Date/Time   NA 139 09/01/2018 0809   K 4.0 09/01/2018 0809   CL 102 09/01/2018 0809   CO2 25 09/01/2018 0809   GLUCOSE 84 09/01/2018 0809   BUN 13 09/01/2018 0809   CREATININE 0.71 09/01/2018 0809   CALCIUM 9.5 09/01/2018 0809   PROT 6.6 09/01/2018 0809   ALBUMIN 4.0 09/01/2018 0809   AST 11 (L) 09/01/2018 0809   ALT 24 09/01/2018 0809   ALKPHOS 56 09/01/2018 0809   BILITOT 0.6 09/01/2018 0809   GFRNONAA >60 09/01/2018 0809   GFRAA >60 09/01/2018 0809       Component Value Date/Time   WBC 5.0 09/01/2018 0809   WBC 5.5 06/17/2014 0540   RBC 3.17 (L) 09/01/2018 0809   HGB 10.2 (L) 09/01/2018 0809   HCT 31.5 (L) 09/01/2018 0809   PLT 190 09/01/2018 0809   MCV 99.4 09/01/2018 0809   MCH 32.2 09/01/2018 0809   MCHC 32.4 09/01/2018 0809   RDW 15.9 (H) 09/01/2018 0809   LYMPHSABS 1.3 09/01/2018 0809   MONOABS 0.4 09/01/2018 0809   EOSABS 0.0 09/01/2018 0809   BASOSABS 0.0 09/01/2018 0809   -------------------------------  Imaging from last 24 hours (if applicable):  Radiology interpretation: No results found.      This case was discussed with Dr. Burr Medico. She expressed agreement with my management of this patient.

## 2018-09-22 ENCOUNTER — Inpatient Hospital Stay: Payer: BC Managed Care – PPO

## 2018-09-22 ENCOUNTER — Telehealth: Payer: Self-pay | Admitting: Hematology

## 2018-09-22 ENCOUNTER — Other Ambulatory Visit: Payer: Self-pay

## 2018-09-22 ENCOUNTER — Other Ambulatory Visit: Payer: Self-pay | Admitting: Medical

## 2018-09-22 ENCOUNTER — Inpatient Hospital Stay (HOSPITAL_BASED_OUTPATIENT_CLINIC_OR_DEPARTMENT_OTHER): Payer: BC Managed Care – PPO | Admitting: Hematology

## 2018-09-22 ENCOUNTER — Encounter: Payer: Self-pay | Admitting: Hematology

## 2018-09-22 VITALS — BP 97/58 | HR 52 | Temp 98.7°F | Resp 17 | Ht 69.0 in | Wt 138.5 lb

## 2018-09-22 DIAGNOSIS — Z170 Estrogen receptor positive status [ER+]: Secondary | ICD-10-CM | POA: Diagnosis not present

## 2018-09-22 DIAGNOSIS — J45909 Unspecified asthma, uncomplicated: Secondary | ICD-10-CM

## 2018-09-22 DIAGNOSIS — Z5111 Encounter for antineoplastic chemotherapy: Secondary | ICD-10-CM | POA: Diagnosis present

## 2018-09-22 DIAGNOSIS — R918 Other nonspecific abnormal finding of lung field: Secondary | ICD-10-CM | POA: Diagnosis not present

## 2018-09-22 DIAGNOSIS — R253 Fasciculation: Secondary | ICD-10-CM | POA: Diagnosis not present

## 2018-09-22 DIAGNOSIS — Z95828 Presence of other vascular implants and grafts: Secondary | ICD-10-CM

## 2018-09-22 DIAGNOSIS — R911 Solitary pulmonary nodule: Secondary | ICD-10-CM | POA: Diagnosis not present

## 2018-09-22 DIAGNOSIS — Z5189 Encounter for other specified aftercare: Secondary | ICD-10-CM | POA: Diagnosis not present

## 2018-09-22 DIAGNOSIS — C50811 Malignant neoplasm of overlapping sites of right female breast: Secondary | ICD-10-CM | POA: Diagnosis not present

## 2018-09-22 DIAGNOSIS — D6481 Anemia due to antineoplastic chemotherapy: Secondary | ICD-10-CM

## 2018-09-22 DIAGNOSIS — L089 Local infection of the skin and subcutaneous tissue, unspecified: Secondary | ICD-10-CM | POA: Diagnosis not present

## 2018-09-22 DIAGNOSIS — Z5112 Encounter for antineoplastic immunotherapy: Secondary | ICD-10-CM | POA: Diagnosis not present

## 2018-09-22 DIAGNOSIS — M7989 Other specified soft tissue disorders: Secondary | ICD-10-CM | POA: Diagnosis not present

## 2018-09-22 LAB — CMP (CANCER CENTER ONLY)
ALT: 20 U/L (ref 0–44)
AST: 14 U/L — ABNORMAL LOW (ref 15–41)
Albumin: 4 g/dL (ref 3.5–5.0)
Alkaline Phosphatase: 55 U/L (ref 38–126)
Anion gap: 12 (ref 5–15)
BUN: 11 mg/dL (ref 6–20)
CO2: 25 mmol/L (ref 22–32)
Calcium: 9.1 mg/dL (ref 8.9–10.3)
Chloride: 103 mmol/L (ref 98–111)
Creatinine: 0.7 mg/dL (ref 0.44–1.00)
GFR, Est AFR Am: >60 mL/min (ref >60)
GFR, Estimated: >60 mL/min (ref >60)
Glucose, Bld: 73 mg/dL (ref 70–99)
Potassium: 3.4 mmol/L — ABNORMAL LOW (ref 3.5–5.1)
Sodium: 140 mmol/L (ref 135–145)
Total Bilirubin: 0.5 mg/dL (ref 0.3–1.2)
Total Protein: 6.4 g/dL — ABNORMAL LOW (ref 6.5–8.1)

## 2018-09-22 LAB — CBC WITH DIFFERENTIAL (CANCER CENTER ONLY)
Abs Immature Granulocytes: 0.01 10*3/uL (ref 0.00–0.07)
Basophils Absolute: 0 10*3/uL (ref 0.0–0.1)
Basophils Relative: 1 %
Eosinophils Absolute: 0 10*3/uL (ref 0.0–0.5)
Eosinophils Relative: 0 %
HCT: 29.8 % — ABNORMAL LOW (ref 36.0–46.0)
Hemoglobin: 9.7 g/dL — ABNORMAL LOW (ref 12.0–15.0)
Immature Granulocytes: 0 %
Lymphocytes Relative: 41 %
Lymphs Abs: 1.3 10*3/uL (ref 0.7–4.0)
MCH: 33 pg (ref 26.0–34.0)
MCHC: 32.6 g/dL (ref 30.0–36.0)
MCV: 101.4 fL — ABNORMAL HIGH (ref 80.0–100.0)
Monocytes Absolute: 0.4 10*3/uL (ref 0.1–1.0)
Monocytes Relative: 13 %
Neutro Abs: 1.4 10*3/uL — ABNORMAL LOW (ref 1.7–7.7)
Neutrophils Relative %: 45 %
Platelet Count: 169 10*3/uL (ref 150–400)
RBC: 2.94 MIL/uL — ABNORMAL LOW (ref 3.87–5.11)
RDW: 15.1 % (ref 11.5–15.5)
WBC Count: 3.1 10*3/uL — ABNORMAL LOW (ref 4.0–10.5)
nRBC: 0 % (ref 0.0–0.2)

## 2018-09-22 LAB — PREGNANCY, URINE: Preg Test, Ur: NEGATIVE

## 2018-09-22 LAB — PREALBUMIN: Prealbumin: 18.9 mg/dL (ref 18–38)

## 2018-09-22 MED ORDER — SODIUM CHLORIDE 0.9% FLUSH
10.0000 mL | INTRAVENOUS | Status: DC | PRN
  Administered 2018-09-22: 10 mL
  Filled 2018-09-22: qty 10

## 2018-09-22 MED ORDER — DIPHENHYDRAMINE HCL 25 MG PO CAPS
50.0000 mg | ORAL_CAPSULE | Freq: Once | ORAL | Status: AC
  Administered 2018-09-22: 50 mg via ORAL

## 2018-09-22 MED ORDER — TRASTUZUMAB CHEMO 150 MG IV SOLR
6.0000 mg/kg | Freq: Once | INTRAVENOUS | Status: AC
  Administered 2018-09-22: 12:00:00 378 mg via INTRAVENOUS
  Filled 2018-09-22: qty 18

## 2018-09-22 MED ORDER — SODIUM CHLORIDE 0.9 % IV SOLN
Freq: Once | INTRAVENOUS | Status: AC
  Administered 2018-09-22: 10:00:00 via INTRAVENOUS
  Filled 2018-09-22: qty 250

## 2018-09-22 MED ORDER — PALONOSETRON HCL INJECTION 0.25 MG/5ML
INTRAVENOUS | Status: AC
  Filled 2018-09-22: qty 5

## 2018-09-22 MED ORDER — MUPIROCIN 2 % EX OINT: TOPICAL_OINTMENT | CUTANEOUS | 0 refills | Status: DC

## 2018-09-22 MED ORDER — SODIUM CHLORIDE 0.9 % IV SOLN
75.0000 mg/m2 | Freq: Once | INTRAVENOUS | Status: AC
  Administered 2018-09-22: 130 mg via INTRAVENOUS
  Filled 2018-09-22: qty 13

## 2018-09-22 MED ORDER — PALONOSETRON HCL INJECTION 0.25 MG/5ML
0.2500 mg | Freq: Once | INTRAVENOUS | Status: AC
  Administered 2018-09-22: 0.25 mg via INTRAVENOUS

## 2018-09-22 MED ORDER — HEPARIN SOD (PORK) LOCK FLUSH 100 UNIT/ML IV SOLN
500.0000 [IU] | Freq: Once | INTRAVENOUS | Status: AC | PRN
  Administered 2018-09-22: 500 [IU]
  Filled 2018-09-22: qty 5

## 2018-09-22 MED ORDER — ACETAMINOPHEN 325 MG PO TABS
ORAL_TABLET | ORAL | Status: AC
  Filled 2018-09-22: qty 2

## 2018-09-22 MED ORDER — SODIUM CHLORIDE 0.9 % IV SOLN
420.0000 mg | Freq: Once | INTRAVENOUS | Status: AC
  Administered 2018-09-22: 12:00:00 420 mg via INTRAVENOUS
  Filled 2018-09-22: qty 14

## 2018-09-22 MED ORDER — ACETAMINOPHEN 325 MG PO TABS
650.0000 mg | ORAL_TABLET | Freq: Once | ORAL | Status: AC
  Administered 2018-09-22: 10:00:00 650 mg via ORAL

## 2018-09-22 MED ORDER — SODIUM CHLORIDE 0.9 % IV SOLN
671.4000 mg | Freq: Once | INTRAVENOUS | Status: AC
  Administered 2018-09-22: 15:00:00 670 mg via INTRAVENOUS
  Filled 2018-09-22: qty 67

## 2018-09-22 MED ORDER — DIPHENHYDRAMINE HCL 25 MG PO CAPS
ORAL_CAPSULE | ORAL | Status: AC
  Filled 2018-09-22: qty 2

## 2018-09-22 MED ORDER — SODIUM CHLORIDE 0.9 % IV SOLN
Freq: Once | INTRAVENOUS | Status: AC
  Administered 2018-09-22: 11:00:00 via INTRAVENOUS
  Filled 2018-09-22: qty 5

## 2018-09-22 NOTE — Patient Instructions (Signed)
[facility] Discharge Instructions for Patients Receiving Chemotherapy  Today you received the following chemotherapy agents Herceptin, Perjeta, Taxotere and Carboplatin   To help prevent nausea and vomiting after your treatment, we encourage you to take your nausea medication as directed.    If you develop nausea and vomiting that is not controlled by your nausea medication, call the clinic.   BELOW ARE SYMPTOMS THAT SHOULD BE REPORTED IMMEDIATELY:  *FEVER GREATER THAN 100.5 F  *CHILLS WITH OR WITHOUT FEVER  NAUSEA AND VOMITING THAT IS NOT CONTROLLED WITH YOUR NAUSEA MEDICATION  *UNUSUAL SHORTNESS OF BREATH  *UNUSUAL BRUISING OR BLEEDING  TENDERNESS IN MOUTH AND THROAT WITH OR WITHOUT PRESENCE OF ULCERS  *URINARY PROBLEMS  *BOWEL PROBLEMS  UNUSUAL RASH Items with * indicate a potential emergency and should be followed up as soon as possible.  Feel free to call the clinic should you have any questions or concerns. The clinic phone number is [phone].  Please show the CHEMO ALERT CARD at check-in to the Emergency Department and triage nurse.   

## 2018-09-22 NOTE — Telephone Encounter (Signed)
Scheduled appt per 6/8 los. °

## 2018-09-22 NOTE — Telephone Encounter (Addendum)
Cost $ 400.00 please adviser alternative ointment needed.   09-23-2018 No new orders noted.  Printed request submitted to [employer] prior authorization letter tray receptacle.

## 2018-09-24 ENCOUNTER — Other Ambulatory Visit: Payer: Self-pay

## 2018-09-24 ENCOUNTER — Inpatient Hospital Stay: Payer: BC Managed Care – PPO

## 2018-09-24 VITALS — BP 112/62 | HR 78 | Temp 98.2°F | Resp 17

## 2018-09-24 DIAGNOSIS — Z5112 Encounter for antineoplastic immunotherapy: Secondary | ICD-10-CM | POA: Diagnosis not present

## 2018-09-24 DIAGNOSIS — Z170 Estrogen receptor positive status [ER+]: Secondary | ICD-10-CM

## 2018-09-24 DIAGNOSIS — C50811 Malignant neoplasm of overlapping sites of right female breast: Secondary | ICD-10-CM

## 2018-09-24 MED ORDER — PEGFILGRASTIM-CBQV 6 MG/0.6ML ~~LOC~~ SOSY
PREFILLED_SYRINGE | SUBCUTANEOUS | Status: AC
  Filled 2018-09-24: qty 0.6

## 2018-09-24 MED ORDER — PEGFILGRASTIM-CBQV 6 MG/0.6ML ~~LOC~~ SOSY
6.0000 mg | PREFILLED_SYRINGE | Freq: Once | SUBCUTANEOUS | Status: AC
  Administered 2018-09-24: 6 mg via SUBCUTANEOUS

## 2018-09-24 NOTE — Patient Instructions (Signed)
Pegfilgrastim injection  What is this medicine?  PEGFILGRASTIM (PEG fil gra stim) is a long-acting granulocyte colony-stimulating factor that stimulates the growth of neutrophils, a type of white blood cell important in the body's fight against infection. It is used to reduce the incidence of fever and infection in patients with certain types of cancer who are receiving chemotherapy that affects the bone marrow, and to increase survival after being exposed to high doses of radiation.  This medicine may be used for other purposes; ask your health care provider or pharmacist if you have questions.  COMMON BRAND NAME(S): Fulphila, Neulasta, UDENYCA  What should I tell my health care provider before I take this medicine?  They need to know if you have any of these conditions:  -kidney disease  -latex allergy  -ongoing radiation therapy  -sickle cell disease  -skin reactions to acrylic adhesives (On-Body Injector only)  -an unusual or allergic reaction to pegfilgrastim, filgrastim, other medicines, foods, dyes, or preservatives  -pregnant or trying to get pregnant  -breast-feeding  How should I use this medicine?  This medicine is for injection under the skin. If you get this medicine at home, you will be taught how to prepare and give the pre-filled syringe or how to use the On-body Injector. Refer to the patient Instructions for Use for detailed instructions. Use exactly as directed. Tell your healthcare provider immediately if you suspect that the On-body Injector may not have performed as intended or if you suspect the use of the On-body Injector resulted in a missed or partial dose.  It is important that you put your used needles and syringes in a special sharps container. Do not put them in a trash can. If you do not have a sharps container, call your pharmacist or healthcare provider to get one.  Talk to your pediatrician regarding the use of this medicine in children. While this drug may be prescribed for  selected conditions, precautions do apply.  Overdosage: If you think you have taken too much of this medicine contact a poison control center or emergency room at once.  NOTE: This medicine is only for you. Do not share this medicine with others.  What if I miss a dose?  It is important not to miss your dose. Call your doctor or health care professional if you miss your dose. If you miss a dose due to an On-body Injector failure or leakage, a new dose should be administered as soon as possible using a single prefilled syringe for manual use.  What may interact with this medicine?  Interactions have not been studied.  Give your health care provider a list of all the medicines, herbs, non-prescription drugs, or dietary supplements you use. Also tell them if you smoke, drink alcohol, or use illegal drugs. Some items may interact with your medicine.  This list may not describe all possible interactions. Give your health care provider a list of all the medicines, herbs, non-prescription drugs, or dietary supplements you use. Also tell them if you smoke, drink alcohol, or use illegal drugs. Some items may interact with your medicine.  What should I watch for while using this medicine?  You may need blood work done while you are taking this medicine.  If you are going to need a MRI, CT scan, or other procedure, tell your doctor that you are using this medicine (On-Body Injector only).  What side effects may I notice from receiving this medicine?  Side effects that you should report to   your doctor or health care professional as soon as possible:  -allergic reactions like skin rash, itching or hives, swelling of the face, lips, or tongue  -back pain  -dizziness  -fever  -pain, redness, or irritation at site where injected  -pinpoint red spots on the skin  -red or dark-brown urine  -shortness of breath or breathing problems  -stomach or side pain, or pain at the shoulder  -swelling  -tiredness  -trouble passing urine or  change in the amount of urine  Side effects that usually do not require medical attention (report to your doctor or health care professional if they continue or are bothersome):  -bone pain  -muscle pain  This list may not describe all possible side effects. Call your doctor for medical advice about side effects. You may report side effects to FDA at 1-800-FDA-1088.  Where should I keep my medicine?  Keep out of the reach of children.  If you are using this medicine at home, you will be instructed on how to store it. Throw away any unused medicine after the expiration date on the label.  NOTE: This sheet is a summary. It may not cover all possible information. If you have questions about this medicine, talk to your doctor, pharmacist, or health care provider.   2019 Elsevier/Gold Standard (2017-07-08 16:57:08)

## 2018-09-25 ENCOUNTER — Telehealth: Payer: Self-pay | Admitting: *Deleted

## 2018-09-25 NOTE — Telephone Encounter (Signed)
Spoke with patient to follow up and congratulate her on completing chemo.  She had her MRI and [facility] and will be seeing the surgeon there next week. She is unsure where she will receive radiation therapy.  Encouraged her to call with any needs or concerns.

## 2018-10-03 NOTE — Telephone Encounter (Signed)
E-prescribing error message received for this order.  Voicemail left that Harlon Ditty instructed provider to cancel Mupirocin ointment order.

## 2018-10-03 NOTE — Telephone Encounter (Signed)
Called Daianna Vasques in reference to receipt of this order and need.  "My finger is good.  I thought [facility] did not have it in stock is why there was a delay.  I used the generic I found left over from my kids.  Don't need it so please go ahead and cancel the order.  Thanks."

## 2018-10-09 ENCOUNTER — Encounter (HOSPITAL_COMMUNITY): Payer: Self-pay

## 2018-10-09 ENCOUNTER — Ambulatory Visit (HOSPITAL_COMMUNITY)
Admission: RE | Admit: 2018-10-09 | Discharge: 2018-10-09 | Disposition: A | Discharge status: Home / Self Care | Payer: BC Managed Care – PPO | Source: Ambulatory Visit | Attending: Hematology | Admitting: Hematology

## 2018-10-09 ENCOUNTER — Other Ambulatory Visit: Payer: Self-pay

## 2018-10-09 DIAGNOSIS — R918 Other nonspecific abnormal finding of lung field: Secondary | ICD-10-CM | POA: Insufficient documentation

## 2018-10-13 NOTE — Progress Notes (Signed)
[place]   Telephone:([phone]) [phone] Fax:(336) [phone]   Clinic Follow up Note   Patient Care Team: [name], MD as PCP - General (Family Medicine) [name], MD as Consulting Physician (General Surgery) [name], MD as Consulting Physician (Hematology) [name], MD as Consulting Physician (Radiation Oncology) [name], MD as Consulting Physician (Obstetrics and Gynecology) [name], RN as Oncology Nurse Navigator [name], RN as Oncology Nurse Navigator  Date of Service:  10/14/2018  CHIEF COMPLAINT:  F/u multifocal right breast cancer  SUMMARY OF ONCOLOGIC HISTORY: Oncology History Overview Note  Cancer Staging Cancer of overlapping sites of right female breast [facility]) Staging form: Breast, AJCC 8th Edition - Clinical stage from 05/28/2018: Stage IB (cT2, cN1, cM0, G3, ER+, PR+, HER2+) - Signed by [name], MD on 05/28/2018  Malignant neoplasm of lower-outer quadrant of right breast of female, estrogen receptor positive ([place]) Staging form: Breast, AJCC 8th Edition - Clinical stage from 05/28/2018: Stage IIB (cT2, cN1, cM0, G3, ER+, PR+, HER2-) - Unsigned     Cancer of overlapping sites of right female breast ([place])  05/14/2018 Mammogram   Right breast mammogram 05/14/18  IMPRESSION  There is a 1.0 x 0.5 x 1.2 cm lobular hypoechoic mass right breast 7 o'clock position 3 cm from nipple, a 0.8 x 0.6 x 1.2 cm lobular hypoechoic mass right breast 7 o'clock position 2 cm from the nipple, and a 0.9 x 0.5 x 0.8 cm lobular hypoechoic mass right breast 5 o'clock position 3 cm from nipple.  There is a 3.8 x 1.0 x 1.3 cm lobular hypoechoic mass right breast 5 o'clock position 2 cm from nipple.  Multiple thickened right axillary lymph nodes (approximately 15). Measuring up to 1.0 cm in thickness. Adenopathy corresponds with axillary palpable abnormality.  Indeterminate round and punctate calcifications upper-outer quadrant right  breast.Indeterminate round and punctate calcifications upper-outer quadrant right breast.   05/21/2018 Initial Biopsy   Diagnosis 05/21/18  1. Breast, right, needle core biopsy, axilla, 5 o'clock, ribbon clip - INVASIVE MAMMARY CARCINOMA. - MAMMARY CARCINOMA IN SITU. 2. Breast, right, needle core biopsy, 7 o'clock, coil clip - INVASIVE MAMMARY CARCINOMA. - MAMMARY CARCINOMA IN SITU. 3. Lymph node, needle/core biopsy, right axillary LN, hydromark - METASTATIC MAMMARY CARCINOMA.    05/21/2018 Receptors her2   1. PROGNOSTIC INDICATORS Results: The tumor cells are EQUIVOCAL for Her2 (2+);  HER2 **NEGATIVE** by FISH   Estrogen Receptor: 80%, POSITIVE, MODERATE STAINING INTENSITY Progesterone Receptor: 5%, POSITIVE, MODERATE-WEAK STAINING INTENSITY Proliferation Marker Ki67: 10%  3. PROGNOSTIC INDICATORS Results: The tumor cells are POSITIVE for Her2 (3+). Estrogen Receptor: 90%, POSITIVE, STRONG STAINING INTENSITY Progesterone Receptor: 50%, POSITIVE, MODERATE STAINING INTENSITY Proliferation Marker Ki67: 5%   05/26/2018 Initial Diagnosis   Malignant neoplasm of lower-inner quadrant of right breast of female, estrogen receptor positive ([place])   05/27/2018 Mammogram   Left breast mammogram 05/27/18  IMPRESSION: No mammographic evidence of LEFT breast malignancy.   05/28/2018 Cancer Staging   Staging form: Breast, AJCC 8th Edition - Clinical stage from 05/28/2018: Stage IB (cT2, cN1, cM0, G3, ER+, PR+, HER2+) - Signed by [name], MD on 05/28/2018   06/03/2018 Imaging   MR BREAST BILATERAL W WO CONTRAST INC CAD  IMPRESSION: 1. Large enhancing masses and non mass enhancement identified throughout the majority of the right breast. Additionally, there are smaller masses identified within the upper inner and lower inner right breast. Overall findings are concerning for extensive malignancy throughout all 4 quadrants  of the right breast. 2. Extensive bulky lymphadenopathy identified within  the right axilla. The matted appearance makes counting the number of abnormal lymph nodes difficult as they are immediately approximated to each other. 3. At least one abnormal appearing right internal mammary lymph node. 4. Nonenhancing lesion within the sternum with associated sternal marrow heterogeneity, raising the possibility of osseous metastatic disease within the sternum. 5. Two enhancing masses within the left breast as described above.     06/05/2018 PET scan   NM PET Image Initial (PI) Skull Base To Thigh  IMPRESSION: 1. Multiple borderline enlarged right axillary lymph nodes identified exhibiting mild to moderate increased uptake. Can not rule out nodal metastasis. Correlation with biopsy recommended. 2. Asymmetric increased uptake within the right breast corresponding to MRI abnormality. 3. Subcentimeter right retropectoral and right internal mammary lymph nodes are again identified and exhibit nonspecific, low level FDG uptake. 4. Noncalcified solid nodule within the lateral right lower lobe measures 9 mm without significant radiotracer uptake. 5. No hypermetabolic osseous lesions identified.     06/09/2018 -  Chemotherapy   Neoadjuvant chemotherapy TCHP, 06/09/2018-09/22/18. Followed by Maintenance Herceptin/Perjeta q3weeks starting 10/14/18 to continue 1 year treatment.    06/10/2018 Pathology Results   Surgical pathology  Diagnosis 1. Breast, left, needle core biopsy, LOQ, barbell - MILD FIBROCYSTIC CHANGES. 2. Breast, left, needle core biopsy, UIQ cylinder - MILD FIBROCYSTIC CHANGES.    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 There were no deleterious mutations in AIP, ALK, APC, ATM, AXIN2,BAP1,  BARD1, BLM, BMPR1A, BRCA1, BRCA2, BRIP1, CASR, CDC73, CDH1, CDK4, CDKN1B, CDKN1C, CDKN2A (p14ARF),  CDKN2A (p16INK4a), CEBPA, CHEK2, CTNNA1, DICER1, DIS3L2, EGFR (c.2369C>T, p.Thr790Met variant only), EPCAM (Deletion/duplication testing only), FH, FLCN, GATA2, GPC3, GREM1 (Promoter region deletion/duplication testing only), HOXB13 (c.251G>A, p.Gly84Glu), HRAS, KIT, MAX, MEN1, MET, MITF (c.952G>A, p.Glu318Lys variant only), MLH1, MSH2, MSH3, MSH6, MUTYH, NBN, NF1, NF2, NTHL1, PDGFRA, PHOX2B, PMS2, POLD1, POLE, POT1, PRKAR1A, PTCH1, PTEN, RAD50, RAD51C, RAD51D, RB1, RECQL4, RET, RUNX1, SDHAF2, SDHA (sequence changes only), SDHB, SDHC, SDHD, SMAD4, SMARCA4, SMARCB1, SMARCE1, STK11, SUFU, TERC, TERT, TMEM127, TP53, TSC1, TSC2, VHL, WRN and WT1.  .   09/24/2018 Breast MRI   Bre[facility] on 09/24/18 Right breast: Signal void inferior right breast, posterior depth from biopsy marker clip. Interval decreased size of the right breast, and now symmetric to the left, compared to prior MRI. Resolution of abnormal nonmass enhancement without evidence of residual enhancing mass. No evidence of axillary or internal mammary lymphadenopathy is seen, markedly improved from prior. There is no abnormal skin, nipple, or pectoralis muscle enhancement.  Left breast: There are no suspicious enhancing masses or areas of non-mass enhancement. No evidence of axillary or internal mammary lymphadenopathy is seen. There is no abnormal skin, nipple, or pectoralis muscle enhancement.   10/09/2018 Imaging   CT chest W Contrast 10/09/18  IMPRESSION: 1. Stable 10 mm right lower lobe pulmonary nodule since 06/05/2018. Nodule is not substantially hypermetabolic on the PET-CT. Continued attention on follow-up recommended. 2. No findings to suggest metastatic disease in the thorax.      CURRENT THERAPY:  Neoadjuvant chemotherapyTCHP, 06/09/2018-09/22/18. Followed by Maintenance Herceptin/Perjeta q3weeks starting 10/14/18 to continue 1 year treatment.   INTERVAL HISTORY:  Kim [name] is here for a follow up and treatment. She  presents to the clinic alone. She notes residual leg swelling from chemo.  She also notes eyelid twitching and foot fungal infection. She is interested in knowing her lung status sooner than later and is interested in lung biopsy after breast surgery.  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     REVIEW OF SYSTEMS:   Constitutional: Denies fevers, chills or abnormal weight loss Eyes: Denies blurriness of vision Ears, nose, mouth, throat, and face: Denies mucositis or sore throat Respiratory: Denies cough, dyspnea or wheezes Cardiovascular: Denies palpitation, chest discomfort (+) lower extremity swelling Gastrointestinal:  Denies nausea, heartburn or change in bowel habits Skin: Denies abnormal skin rashes Lymphatics: Denies new lymphadenopathy or easy bruising Neurological:Denies numbness, tingling or new weaknesses Behavioral/Psych: Mood is stable, no new changes  All other systems were reviewed with the patient and are negative.  MEDICAL HISTORY:  Past Medical History:  Diagnosis Date  . Acid reflux   . Acute bronchitis   . Anemia   . Asthma   . Family history of brain cancer   . Family history of breast cancer   . Family history of prostate cancer   . Migraine     SURGICAL HISTORY: Past Surgical History:  Procedure Laterality Date  . BREAST BIOPSY Right 05/23/2018   x3, malignant  . DILATION AND CURETTAGE OF UTERUS     2006 x1, 2009 x2  . LAPAROTOMY  2008  . PORTACATH PLACEMENT Left 06/06/2018   Procedure: INSERTION PORT-A-CATH POSSIBLE ULTRASOUND;  Surgeon: [name], MD;  Location: [facility];  Service: General;  Laterality: Left;    I have reviewed the social history and family history with the patient and they are unchanged from previous note.  ALLERGIES:  has No Known Allergies.  MEDICATIONS:  Current Outpatient Medications  Medication Sig Dispense Refill  . albuterol (PROVENTIL HFA;VENTOLIN HFA) 108 (90 BASE) MCG/ACT inhaler Inhale 2  puffs into the lungs every 6 (six) hours as needed for wheezing or shortness of breath.    . ALPRAZolam (XANAX) 0.5 MG tablet Take 0.5 mg by mouth daily as needed for anxiety. 3 to 5 times month    . calcium carbonate (TUMS - DOSED IN MG ELEMENTAL CALCIUM) 500 MG chewable tablet Chew 1 tablet by mouth daily as needed. heartburn    . cetirizine (ZYRTEC) 10 MG tablet Take 10 mg by mouth daily as needed for allergies.    Marland Kitchen dexamethasone (DECADRON) 4 MG tablet Take 1 tab twice daily, start the day before Taxotere. Take once the day after X3 days 30 tablet 1  . fish oil-omega-3 fatty acids 1000 MG capsule Take 1 g by mouth daily.     . Fluticasone Propionate (FLONASE ALLERGY RELIEF NA) Place into the nose.    . lidocaine-prilocaine (EMLA) cream Apply 1 application topically as needed. 30 g 0  . ondansetron (ZOFRAN) 8 MG tablet Take 1 tablet (8 mg total) by mouth every 8 (eight) hours as needed for nausea or vomiting. 20 tablet 0  . Probiotic Product (PROBIOTIC DAILY PO) Take 1 capsule by mouth daily.     . prochlorperazine (COMPAZINE) 10 MG tablet TAKE 1 TABLET (10 MG TOTAL) BY MOUTH EVERY 6 (SIX) HOURS AS NEEDED FOR NAUSEA OR VOMITING. 30 tablet 0  . propranolol (INDERAL) 10 MG tablet Take 5 mg by mouth daily as needed. Takes as needed for shaking hand less than 10 times per year    . diphenoxylate-atropine (LOMOTIL) 2.5-0.025 MG tablet Take 1-2 tablets by mouth 4 (four) times daily as needed for diarrhea or loose  stools. (Patient not taking: Reported on 10/14/2018) 40 tablet 0  . mupirocin cream (BACTROBAN) 2 % Apply 1 application topically 3 (three) times daily. (Patient not taking: Reported on 10/14/2018) 30 g 1  . mupirocin ointment (BACTROBAN) 2 % Apply to affected area TID until improvement is noted (Patient not taking: Reported on 10/14/2018) 30 g 0   No current facility-administered medications for this visit.    Facility-Administered Medications Ordered in Other Visits  Medication Dose Route  Frequency Provider Last Rate Last Dose  . heparin lock flush 100 unit/mL  500 Units Intracatheter Once PRN [name], MD      . sodium chloride flush (NS) 0.9 % injection 10 mL  10 mL Intracatheter PRN [name], MD        PHYSICAL EXAMINATION: ECOG PERFORMANCE STATUS: 1 - Symptomatic but completely ambulatory  Vitals:   10/14/18 1108  BP: 91/61  Pulse: 62  Resp: 17  Temp: 98.9 F (37.2 C)  SpO2: 100%   Filed Weights   10/14/18 1108  Weight: 142 lb 3.2 oz (64.5 kg)    GENERAL:alert, no distress and comfortable SKIN: skin color, texture, turgor are normal, no rashes or significant lesions (+) Left foot fungal infection LUNGS: clear to auscultation and percussion with normal breathing effort HEART: regular rate & rhythm and no murmurs and no lower extremity edema ABDOMEN:abdomen soft, non-tender and normal bowel sounds Musculoskeletal:no cyanosis of digits and no clubbing  NEURO: alert & oriented x 3 with fluent speech, no focal motor/sensory deficits BREAST exam deferred today   LABORATORY DATA:  I have reviewed the data as listed CBC Latest Ref Rng & Units 10/14/2018 09/22/2018 09/01/2018  WBC 4.0 - 10.5 K/uL 3.0(L) 3.1(L) 5.0  Hemoglobin 12.0 - 15.0 g/dL 10.4(L) 9.7(L) 10.2(L)  Hematocrit 36.0 - 46.0 % 32.2(L) 29.8(L) 31.5(L)  Platelets 150 - 400 K/uL 193 169 190     CMP Latest Ref Rng & Units 10/14/2018 09/22/2018 09/01/2018  Glucose 70 - 99 mg/dL 91 73 84  BUN 6 - 20 mg/dL _0 Creatinine 0.44 - 1.00 mg/dL 0.70 0.70 0.71  Sodium 135 - 145 mmol/L 142 140 139  Potassium 3.5 - 5.1 mmol/L 4.1 3.4(L) 4.0  Chloride 98 - 111 mmol/L 106 103 102  CO2 22 - 32 mmol/L _1 Calcium 8.9 - 10.3 mg/dL 9.0 9.1 9.5  Total Protein 6.5 - 8.1 g/dL 6.8 6.4(L) 6.6  Total Bilirubin 0.3 - 1.2 mg/dL 0.3 0.5 0.6  Alkaline Phos 38 - 126 U/L 55 55 56  AST 15 - 41 U/L 17 14(L) 11(L)  ALT 0 - 44 U/L _2 ANC 1.6K today    RADIOGRAPHIC STUDIES: I have personally reviewed the  radiological images as listed and agreed with the findings in the report. No results found.   ASSESSMENT & PLAN:  Kim [name] is a 43 y.o. 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 [place] also showed 15abnormallymph nodes. -There is discrepancy of her to test results between breast biopsy and axillary lymph node biopsy,we plan to repeat HER2 on her surgical sample. -She was seen by Med Onc Dr. Hiram Comber at [facility] in 05/2018 for second opinion. Theyrecommendedshe proceed with her current  neoadjuvant chemoregimen. -ShehasstartedneoadjuvantTCHP on 2/24/20with Udenyca on day 3. She tolerates well with mild nausea, constipation (controlled), progressingdry skin of handsand feet with cracking and 1 instance of infection, and progressing fatigue.  -By cycle 4 her right breast mass and right axillary LN was no longer palpable. She has completed planned 6 cycle chemo -Her breast MRI at [facility] was 09/24/18 showed complete response from chemotherapy. She is scheduled to have right maste[facility] at [facility] 7/7 -I personally reviewed and discussed her CT chest form 10/09/18 with pt which shows stable 9-54m right lung nodule, no change form her PET scan in 05/2018.  We discussed that her lung nodule likely benign given the stable appearance in 4 months, however malignancy is not completely ruled out. I spoke with IR Dr. YKathlene Cote who feels needle biopsy is feasible, given the size and location. I recommended a f/u CT in 3-4 months, or biopsy after she recovers from breast surgery. She opted to have biopsy  -She is recovering well from chemo. She does still have residual leg swelling,  eyelid twitching and notes left foot fungal infection. I advised her to elevate feet when sitting and use OTC Fluconazole cream on her feet.  -[facility].  -Labs reviewed, CBC and CMP WNL except WBC 3, Hg 10.4, ANC 1.6. Overall adequate to proceed with maintenance Herceptin/Perjeta today -If she has significant residual cancer on her surgical sample, will change to TDM-1 ( [place]) in the adjuvant setting. Will repeat her HER2 maker on surgical sample.  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 She has discovered a stronger maternal family history of prostate and breast cancerin her 2nd and 3rd degree relatives. -We discussed the risk of future breast cancer from PALB2mu65mion, which depends on her family history.I thinkit isveryreasonable to proceed with b/l breast mastectomy given her positive family history of cancers. She is agreeable and will have breast reconstruction. -Wepreviouslyreviewed the risk for other cancers in carriers for PALB2 mutations, including female breast cancer, prostate, ovarian and pancreatic cancer, although these risks have not yet been quantified. While there is an increased risk for other cancers, there are no current guidelines for screening for these cancers, and at this time NCCN does not suggest a discussion of risk reducing salpingo-oophorectomy. -Her mother, 73, 70s recently diagnosed with breast cancer triple negative, probably PALB2 mutation related. She will proceed with b/l mastectomy in 09/2018.   3. Asthma  -mild, continuealbuterol inhaler as needed -I reviewedsheis higherrisk for infection. I discussedinfection precautions and advised herto avoid unnecessary public venues.  -If she develops fever or productive cough with phlegm  she can contact our clinic. -stable  4. Diet and fasting  -She was overall stable and able to maintain her weight during chemo.  -Will monitor. She has been able to gain weight lately.  5. Allergy Symptoms  -She had a recent significant episode of sinus congestion and drainage. Has improved recently.  -I encouraged her to useClaritin,Flonase and continue use of inhaler as needed.  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 9mm 58m lung nodule  -Her initial staging PET scan showed a 9 mm solid  rounded nodule within the lateral right lower lobe, not hypermetabolic -CT chest from 3/66/81 shows stable 53m right lung nodule.  -I discussed we can biopsy after breast surgery or follow up every 3-6 months by CT scan. She is interested in biopsy after surgery.   8. Leg edema  -likely related to chemo and steroids, resolves in the morning, symmetric, low suspicion for DVT -She will continue wearing compression stocks during the day   Plan -Reviewed CT chest, will likely biopsy her right lung nodule after she recovers from breast surgery. - Breast MRI shows complete radiographic response.  -Labs reviewedand adequate to proceedwithHerceptin/Perjeta today  -Lab, flush, f/u andHerceptin/Perjeta in 3 weeks   No problem-specific Assessment & Plan notes found for this encounter.   No orders of the defined types were placed in this encounter.  All questions were answered. The patient knows to call the clinic with any problems, questions or concerns. No barriers to learning was detected. I spent 25 minutes counseling the patient face to face. The total time spent in the appointment was 30 minutes and more than 50% was on counseling and review of test results     YTruitt Merle MD  10/14/2018   I, AJoslyn Devon am acting as scribe for YTruitt Merle MD.   I have reviewed the above documentation for accuracy and completeness, and I agree with the above.

## 2018-10-14 ENCOUNTER — Encounter: Payer: Self-pay | Admitting: Hematology

## 2018-10-14 ENCOUNTER — Telehealth: Payer: Self-pay | Admitting: Hematology

## 2018-10-14 ENCOUNTER — Inpatient Hospital Stay: Payer: BC Managed Care – PPO

## 2018-10-14 ENCOUNTER — Other Ambulatory Visit: Payer: Self-pay

## 2018-10-14 ENCOUNTER — Inpatient Hospital Stay (HOSPITAL_BASED_OUTPATIENT_CLINIC_OR_DEPARTMENT_OTHER): Payer: BC Managed Care – PPO | Admitting: Hematology

## 2018-10-14 VITALS — BP 91/61 | HR 62 | Temp 98.9°F | Resp 17 | Ht 69.0 in | Wt 142.2 lb

## 2018-10-14 VITALS — BP 101/68 | HR 57 | Temp 97.7°F | Resp 18

## 2018-10-14 DIAGNOSIS — Z170 Estrogen receptor positive status [ER+]: Secondary | ICD-10-CM

## 2018-10-14 DIAGNOSIS — R253 Fasciculation: Secondary | ICD-10-CM

## 2018-10-14 DIAGNOSIS — C50811 Malignant neoplasm of overlapping sites of right female breast: Secondary | ICD-10-CM | POA: Diagnosis not present

## 2018-10-14 DIAGNOSIS — J45909 Unspecified asthma, uncomplicated: Secondary | ICD-10-CM

## 2018-10-14 DIAGNOSIS — Z95828 Presence of other vascular implants and grafts: Secondary | ICD-10-CM

## 2018-10-14 DIAGNOSIS — Z5112 Encounter for antineoplastic immunotherapy: Secondary | ICD-10-CM | POA: Diagnosis not present

## 2018-10-14 DIAGNOSIS — M7989 Other specified soft tissue disorders: Secondary | ICD-10-CM | POA: Diagnosis not present

## 2018-10-14 DIAGNOSIS — R911 Solitary pulmonary nodule: Secondary | ICD-10-CM

## 2018-10-14 DIAGNOSIS — D6481 Anemia due to antineoplastic chemotherapy: Secondary | ICD-10-CM

## 2018-10-14 LAB — CMP (CANCER CENTER ONLY)
ALT: 27 U/L (ref 0–44)
AST: 17 U/L (ref 15–41)
Albumin: 3.9 g/dL (ref 3.5–5.0)
Alkaline Phosphatase: 55 U/L (ref 38–126)
Anion gap: 9 (ref 5–15)
BUN: 10 mg/dL (ref 6–20)
CO2: 27 mmol/L (ref 22–32)
Calcium: 9 mg/dL (ref 8.9–10.3)
Chloride: 106 mmol/L (ref 98–111)
Creatinine: 0.7 mg/dL (ref 0.44–1.00)
GFR, Est AFR Am: >60 mL/min (ref >60)
GFR, Estimated: >60 mL/min (ref >60)
Glucose, Bld: 91 mg/dL (ref 70–99)
Potassium: 4.1 mmol/L (ref 3.5–5.1)
Sodium: 142 mmol/L (ref 135–145)
Total Bilirubin: 0.3 mg/dL (ref 0.3–1.2)
Total Protein: 6.8 g/dL (ref 6.5–8.1)

## 2018-10-14 LAB — CBC WITH DIFFERENTIAL (CANCER CENTER ONLY)
Abs Immature Granulocytes: 0 10*3/uL (ref 0.00–0.07)
Basophils Absolute: 0 10*3/uL (ref 0.0–0.1)
Basophils Relative: 1 %
Eosinophils Absolute: 0 10*3/uL (ref 0.0–0.5)
Eosinophils Relative: 0 %
HCT: 32.2 % — ABNORMAL LOW (ref 36.0–46.0)
Hemoglobin: 10.4 g/dL — ABNORMAL LOW (ref 12.0–15.0)
Immature Granulocytes: 0 %
Lymphocytes Relative: 32 %
Lymphs Abs: 1 10*3/uL (ref 0.7–4.0)
MCH: 33.5 pg (ref 26.0–34.0)
MCHC: 32.3 g/dL (ref 30.0–36.0)
MCV: 103.9 fL — ABNORMAL HIGH (ref 80.0–100.0)
Monocytes Absolute: 0.4 10*3/uL (ref 0.1–1.0)
Monocytes Relative: 13 %
Neutro Abs: 1.6 10*3/uL — ABNORMAL LOW (ref 1.7–7.7)
Neutrophils Relative %: 54 %
Platelet Count: 193 10*3/uL (ref 150–400)
RBC: 3.1 MIL/uL — ABNORMAL LOW (ref 3.87–5.11)
RDW: 14.6 % (ref 11.5–15.5)
WBC Count: 3 10*3/uL — ABNORMAL LOW (ref 4.0–10.5)
nRBC: 0 % (ref 0.0–0.2)

## 2018-10-14 MED ORDER — SODIUM CHLORIDE 0.9 % IV SOLN
Freq: Once | INTRAVENOUS | Status: AC
  Administered 2018-10-14: 12:00:00 via INTRAVENOUS
  Filled 2018-10-14: qty 250

## 2018-10-14 MED ORDER — HEPARIN SOD (PORK) LOCK FLUSH 100 UNIT/ML IV SOLN
500.0000 [IU] | Freq: Once | INTRAVENOUS | Status: AC | PRN
  Administered 2018-10-14: 15:00:00 500 [IU]
  Filled 2018-10-14: qty 5

## 2018-10-14 MED ORDER — SODIUM CHLORIDE 0.9% FLUSH
10.0000 mL | INTRAVENOUS | Status: DC | PRN
  Administered 2018-10-14: 10 mL
  Filled 2018-10-14: qty 10

## 2018-10-14 MED ORDER — DIPHENHYDRAMINE HCL 25 MG PO CAPS
50.0000 mg | ORAL_CAPSULE | Freq: Once | ORAL | Status: AC
  Administered 2018-10-14: 50 mg via ORAL

## 2018-10-14 MED ORDER — SODIUM CHLORIDE 0.9 % IV SOLN
420.0000 mg | Freq: Once | INTRAVENOUS | Status: AC
  Administered 2018-10-14: 420 mg via INTRAVENOUS
  Filled 2018-10-14: qty 14

## 2018-10-14 MED ORDER — ACETAMINOPHEN 325 MG PO TABS
650.0000 mg | ORAL_TABLET | Freq: Once | ORAL | Status: AC
  Administered 2018-10-14: 12:00:00 650 mg via ORAL

## 2018-10-14 MED ORDER — TRASTUZUMAB CHEMO 150 MG IV SOLR
6.0000 mg/kg | Freq: Once | INTRAVENOUS | Status: AC
  Administered 2018-10-14: 13:00:00 378 mg via INTRAVENOUS
  Filled 2018-10-14: qty 18

## 2018-10-14 MED ORDER — DIPHENHYDRAMINE HCL 25 MG PO CAPS
ORAL_CAPSULE | ORAL | Status: AC
  Filled 2018-10-14: qty 2

## 2018-10-14 MED ORDER — ACETAMINOPHEN 325 MG PO TABS
ORAL_TABLET | ORAL | Status: AC
  Filled 2018-10-14: qty 2

## 2018-10-14 NOTE — Patient Instructions (Addendum)
[facility] Discharge Instructions for Patients Receiving Chemotherapy  Today you received the following Immunotherapy:  Herceptin, Projeta  To help prevent nausea and vomiting after your treatment, we encourage you to take your nausea medication as directed by your MD.   If you develop nausea and vomiting that is not controlled by your nausea medication, call the clinic.   BELOW ARE SYMPTOMS THAT SHOULD BE REPORTED IMMEDIATELY:  *FEVER GREATER THAN 100.5 F  *CHILLS WITH OR WITHOUT FEVER  NAUSEA AND VOMITING THAT IS NOT CONTROLLED WITH YOUR NAUSEA MEDICATION  *UNUSUAL SHORTNESS OF BREATH  *UNUSUAL BRUISING OR BLEEDING  TENDERNESS IN MOUTH AND THROAT WITH OR WITHOUT PRESENCE OF ULCERS  *URINARY PROBLEMS  *BOWEL PROBLEMS  UNUSUAL RASH Items with * indicate a potential emergency and should be followed up as soon as possible.  Feel free to call the clinic should you have any questions or concerns. The clinic phone number is [phone].  Please show the [place] [facility] at check-in to the Emergency Department and triage nurse.  Coronavirus (COVID-19) Are you at risk?  Are you at risk for the Coronavirus (COVID-19)?  To be considered HIGH RISK for Coronavirus (COVID-19), you have to meet the following criteria:  . Traveled to [place], [place], [place], [place] or [place]; or in the [place] to [place], [place], [place], or [place]; and have fever, cough, and shortness of breath within the last 2 weeks of travel OR . Been in close contact with a person diagnosed with COVID-19 within the last 2 weeks and have fever, cough, and shortness of breath . IF YOU DO NOT MEET THESE CRITERIA, YOU ARE CONSIDERED LOW RISK FOR COVID-19.  What to do if you are HIGH RISK for COVID-19?  Marland Kitchen If you are having a medical emergency, call 911. . Seek medical care right away. Before you go to a doctor's office, urgent care or emergency department, call ahead and tell them  about your recent travel, contact with someone diagnosed with COVID-19, and your symptoms. You should receive instructions from your physician's office regarding next steps of care.  . When you arrive at healthcare provider, tell the healthcare staff immediately you have returned from visiting [place], [place], [place], [place] or [place]; or traveled in the [place] to [place], [place], [place], or [place]; in the last two weeks or you have been in close contact with a person diagnosed with COVID-19 in the last 2 weeks.   . Tell the health care staff about your symptoms: fever, cough and shortness of breath. . After you have been seen by a medical provider, you will be either: o Tested for (COVID-19) and discharged home on quarantine except to seek medical care if symptoms worsen, and asked to  - Stay home and avoid contact with others until you get your results (4-5 days)  - Avoid travel on public transportation if possible (such as bus, train, or airplane) or o Sent to the Emergency Department by EMS for evaluation, COVID-19 testing, and possible admission depending on your condition and test results.  What to do if you are LOW RISK for COVID-19?  Reduce your risk of any infection by using the same precautions used for avoiding the common cold or flu:  Marland Kitchen Wash your hands often with soap and warm water for at least 20 seconds.  If soap and water are not readily available, use an alcohol-based hand sanitizer with at least 60% alcohol.  Marland Kitchen  If coughing or sneezing, cover your mouth and nose by coughing or sneezing into the elbow areas of your shirt or coat, into a tissue or into your sleeve (not your hands). . Avoid shaking hands with others and consider head nods or verbal greetings only. . Avoid touching your eyes, nose, or mouth with unwashed hands.  . Avoid close contact with people who are sick. . Avoid places or events with large numbers of people in one location, like concerts or  sporting events. . Carefully consider travel plans you have or are making. . If you are planning any travel outside or inside the [place], visit the CDC's Travelers' Health webpage for the latest health notices. . If you have some symptoms but not all symptoms, continue to monitor at home and seek medical attention if your symptoms worsen. . If you are having a medical emergency, call 911.   [place] / e-Visit: [email or web address]         [facility]: [facility]: [phone]                   [facility]: [phone]

## 2018-10-14 NOTE — Telephone Encounter (Signed)
Scheduled appt per 6/30 los.

## 2018-10-28 ENCOUNTER — Telehealth: Payer: Self-pay | Admitting: Hematology

## 2018-10-28 NOTE — Telephone Encounter (Signed)
YF out 7/20 f/u moved to [facility] and associated appointment times adjusted. Left message. Schedule mailed.

## 2018-10-31 ENCOUNTER — Telehealth: Payer: Self-pay | Admitting: Hematology

## 2018-10-31 NOTE — Telephone Encounter (Signed)
LB out 7/20 f/u moved to [facility]. Confirmed with patient.

## 2018-11-03 ENCOUNTER — Other Ambulatory Visit: Payer: Self-pay

## 2018-11-03 ENCOUNTER — Inpatient Hospital Stay: Payer: BC Managed Care – PPO | Attending: Hematology | Admitting: Physician Assistant

## 2018-11-03 ENCOUNTER — Inpatient Hospital Stay: Payer: BC Managed Care – PPO

## 2018-11-03 ENCOUNTER — Telehealth: Payer: Self-pay

## 2018-11-03 ENCOUNTER — Other Ambulatory Visit: Payer: BC Managed Care – PPO

## 2018-11-03 VITALS — BP 92/63 | HR 70 | Temp 99.1°F | Resp 17 | Ht 69.0 in | Wt 138.3 lb

## 2018-11-03 VITALS — BP 93/69 | HR 55 | Resp 18

## 2018-11-03 DIAGNOSIS — D6481 Anemia due to antineoplastic chemotherapy: Secondary | ICD-10-CM | POA: Insufficient documentation

## 2018-11-03 DIAGNOSIS — C50811 Malignant neoplasm of overlapping sites of right female breast: Secondary | ICD-10-CM | POA: Diagnosis not present

## 2018-11-03 DIAGNOSIS — Z170 Estrogen receptor positive status [ER+]: Secondary | ICD-10-CM

## 2018-11-03 DIAGNOSIS — R911 Solitary pulmonary nodule: Secondary | ICD-10-CM | POA: Insufficient documentation

## 2018-11-03 DIAGNOSIS — Z95828 Presence of other vascular implants and grafts: Secondary | ICD-10-CM

## 2018-11-03 DIAGNOSIS — Z5112 Encounter for antineoplastic immunotherapy: Secondary | ICD-10-CM | POA: Diagnosis not present

## 2018-11-03 LAB — CBC WITH DIFFERENTIAL (CANCER CENTER ONLY)
Abs Immature Granulocytes: 0.01 10*3/uL (ref 0.00–0.07)
Basophils Absolute: 0 10*3/uL (ref 0.0–0.1)
Basophils Relative: 1 %
Eosinophils Absolute: 0.1 10*3/uL (ref 0.0–0.5)
Eosinophils Relative: 6 %
HCT: 28.4 % — ABNORMAL LOW (ref 36.0–46.0)
Hemoglobin: 9 g/dL — ABNORMAL LOW (ref 12.0–15.0)
Immature Granulocytes: 1 %
Lymphocytes Relative: 36 %
Lymphs Abs: 0.8 10*3/uL (ref 0.7–4.0)
MCH: 32.5 pg (ref 26.0–34.0)
MCHC: 31.7 g/dL (ref 30.0–36.0)
MCV: 102.5 fL — ABNORMAL HIGH (ref 80.0–100.0)
Monocytes Absolute: 0.2 10*3/uL (ref 0.1–1.0)
Monocytes Relative: 8 %
Neutro Abs: 1 10*3/uL — ABNORMAL LOW (ref 1.7–7.7)
Neutrophils Relative %: 48 %
Platelet Count: 160 10*3/uL (ref 150–400)
RBC: 2.77 MIL/uL — ABNORMAL LOW (ref 3.87–5.11)
RDW: 13 % (ref 11.5–15.5)
WBC Count: 2.1 10*3/uL — ABNORMAL LOW (ref 4.0–10.5)
nRBC: 0 % (ref 0.0–0.2)

## 2018-11-03 LAB — PREALBUMIN: Prealbumin: 25.4 mg/dL (ref 18–38)

## 2018-11-03 LAB — CMP (CANCER CENTER ONLY)
ALT: 20 U/L (ref 0–44)
AST: 11 U/L — ABNORMAL LOW (ref 15–41)
Albumin: 3.9 g/dL (ref 3.5–5.0)
Alkaline Phosphatase: 52 U/L (ref 38–126)
Anion gap: 9 (ref 5–15)
BUN: 13 mg/dL (ref 6–20)
CO2: 27 mmol/L (ref 22–32)
Calcium: 8.9 mg/dL (ref 8.9–10.3)
Chloride: 105 mmol/L (ref 98–111)
Creatinine: 0.8 mg/dL (ref 0.44–1.00)
GFR, Est AFR Am: >60 mL/min (ref >60)
GFR, Estimated: >60 mL/min (ref >60)
Glucose, Bld: 141 mg/dL — ABNORMAL HIGH (ref 70–99)
Potassium: 4.1 mmol/L (ref 3.5–5.1)
Sodium: 141 mmol/L (ref 135–145)
Total Bilirubin: 0.3 mg/dL (ref 0.3–1.2)
Total Protein: 6.4 g/dL — ABNORMAL LOW (ref 6.5–8.1)

## 2018-11-03 LAB — PREGNANCY, URINE: Preg Test, Ur: NEGATIVE

## 2018-11-03 MED ORDER — DIPHENHYDRAMINE HCL 25 MG PO CAPS
ORAL_CAPSULE | ORAL | Status: AC
  Filled 2018-11-03: qty 2

## 2018-11-03 MED ORDER — SODIUM CHLORIDE 0.9% FLUSH
10.0000 mL | INTRAVENOUS | Status: DC | PRN
  Administered 2018-11-03: 10 mL
  Filled 2018-11-03: qty 10

## 2018-11-03 MED ORDER — HEPARIN SOD (PORK) LOCK FLUSH 100 UNIT/ML IV SOLN
500.0000 [IU] | Freq: Once | INTRAVENOUS | Status: AC | PRN
  Administered 2018-11-03: 500 [IU]
  Filled 2018-11-03: qty 5

## 2018-11-03 MED ORDER — ACETAMINOPHEN 325 MG PO TABS
ORAL_TABLET | ORAL | Status: AC
  Filled 2018-11-03: qty 2

## 2018-11-03 MED ORDER — DIPHENHYDRAMINE HCL 25 MG PO CAPS
50.0000 mg | ORAL_CAPSULE | Freq: Once | ORAL | Status: AC
  Administered 2018-11-03: 50 mg via ORAL

## 2018-11-03 MED ORDER — ACETAMINOPHEN 325 MG PO TABS
650.0000 mg | ORAL_TABLET | Freq: Once | ORAL | Status: AC
  Administered 2018-11-03: 650 mg via ORAL

## 2018-11-03 MED ORDER — SODIUM CHLORIDE 0.9 % IV SOLN
420.0000 mg | Freq: Once | INTRAVENOUS | Status: AC
  Administered 2018-11-03: 420 mg via INTRAVENOUS
  Filled 2018-11-03: qty 14

## 2018-11-03 MED ORDER — TRASTUZUMAB CHEMO 150 MG IV SOLR
6.0000 mg/kg | Freq: Once | INTRAVENOUS | Status: AC
  Administered 2018-11-03: 378 mg via INTRAVENOUS
  Filled 2018-11-03: qty 18

## 2018-11-03 MED ORDER — SODIUM CHLORIDE 0.9 % IV SOLN
Freq: Once | INTRAVENOUS | Status: AC
  Administered 2018-11-03: 11:00:00 via INTRAVENOUS
  Filled 2018-11-03: qty 250

## 2018-11-03 NOTE — Telephone Encounter (Signed)
Faxed request to have Governor Specking pathologist at [facility] call Dr. Burr Medico on her cell phone regarding [phone]

## 2018-11-03 NOTE — Patient Instructions (Signed)
[facility] Discharge Instructions for Patients Receiving Chemotherapy  Today you received the following Immunotherapy: Herceptin, Perjeta  To help prevent nausea and vomiting after your treatment, we encourage you to take your nausea medication as directed by your MD.   If you develop nausea and vomiting that is not controlled by your nausea medication, call the clinic.   BELOW ARE SYMPTOMS THAT SHOULD BE REPORTED IMMEDIATELY:  *FEVER GREATER THAN 100.5 F  *CHILLS WITH OR WITHOUT FEVER  NAUSEA AND VOMITING THAT IS NOT CONTROLLED WITH YOUR NAUSEA MEDICATION  *UNUSUAL SHORTNESS OF BREATH  *UNUSUAL BRUISING OR BLEEDING  TENDERNESS IN MOUTH AND THROAT WITH OR WITHOUT PRESENCE OF ULCERS  *URINARY PROBLEMS  *BOWEL PROBLEMS  UNUSUAL RASH Items with * indicate a potential emergency and should be followed up as soon as possible.  Feel free to call the clinic should you have any questions or concerns. The clinic phone number is [phone].  Please show the [facility] at check-in to the Emergency Department and triage nurse.  Coronavirus (COVID-19) Are you at risk?  Are you at risk for the Coronavirus (COVID-19)?  To be considered HIGH RISK for Coronavirus (COVID-19), you have to meet the following criteria:  . Traveled to [place], [place], [place], [place] or [place]; or in the [place] to [place], [place], [place], or [place]; and have fever, cough, and shortness of breath within the last 2 weeks of travel OR . Been in close contact with a person diagnosed with COVID-19 within the last 2 weeks and have fever, cough, and shortness of breath . IF YOU DO NOT MEET THESE CRITERIA, YOU ARE CONSIDERED LOW RISK FOR COVID-19.  What to do if you are HIGH RISK for COVID-19?  Marland Kitchen If you are having a medical emergency, call 911. . Seek medical care right away. Before you go to a doctor's office, urgent care or emergency department, call ahead and tell them  about your recent travel, contact with someone diagnosed with COVID-19, and your symptoms. You should receive instructions from your physician's office regarding next steps of care.  . When you arrive at healthcare provider, tell the healthcare staff immediately you have returned from visiting [place], [place], [place], [place] or [place]; or traveled in the [place] to [place], [place], [place], or [place]; in the last two weeks or you have been in close contact with a person diagnosed with COVID-19 in the last 2 weeks.   . Tell the health care staff about your symptoms: fever, cough and shortness of breath. . After you have been seen by a medical provider, you will be either: o Tested for (COVID-19) and discharged home on quarantine except to seek medical care if symptoms worsen, and asked to  - Stay home and avoid contact with others until you get your results (4-5 days)  - Avoid travel on public transportation if possible (such as bus, train, or airplane) or o Sent to the Emergency Department by EMS for evaluation, COVID-19 testing, and possible admission depending on your condition and test results.  What to do if you are LOW RISK for COVID-19?  Reduce your risk of any infection by using the same precautions used for avoiding the common cold or flu:  Marland Kitchen Wash your hands often with soap and warm water for at least 20 seconds.  If soap and water are not readily available, use an alcohol-based hand sanitizer with at least 60% alcohol.  . If  coughing or sneezing, cover your mouth and nose by coughing or sneezing into the elbow areas of your shirt or coat, into a tissue or into your sleeve (not your hands). . Avoid shaking hands with others and consider head nods or verbal greetings only. . Avoid touching your eyes, nose, or mouth with unwashed hands.  . Avoid close contact with people who are sick. . Avoid places or events with large numbers of people in one location, like concerts or  sporting events. . Carefully consider travel plans you have or are making. . If you are planning any travel outside or inside the [place], visit the CDC's Travelers' Health webpage for the latest health notices. . If you have some symptoms but not all symptoms, continue to monitor at home and seek medical attention if your symptoms worsen. . If you are having a medical emergency, call 911.   ADDITIONAL HEALTHCARE OPTIONS FOR PATIENTS  [facility] / e-Visit: [email or web address]         [facility]: [phone]  [facility]: [phone]                   [facility]: [phone]   

## 2018-11-03 NOTE — Progress Notes (Signed)
[facility] OFFICE PROGRESS NOTE  Patient Care Team: [name], MD as PCP - General (Family Medicine) [name], MD as Consulting Physician (General Surgery) [name], MD as Consulting Physician (Hematology) [name], MD as Consulting Physician (Radiation Oncology) [name], MD as Consulting Physician (Obstetrics and Gynecology) [name], RN as Oncology Nurse Navigator [name], RN as Oncology Nurse Navigator  Date of Service:  10/14/2018  CHIEF COMPLAINT: F/u multifocal right breast cancer  SUMMARY OF ONCOLOGIC HISTORY:     Oncology History Overview Note   Cancer Staging Cancer of overlapping sites of right female breast [facility]) Staging form: Breast, AJCC 8th Edition - Clinical stage from 05/28/2018: Stage IB (cT2, cN1, cM0, G3, ER+, PR+, HER2+) - Signed by [name], MD on 05/28/2018  Malignant neoplasm of lower-outer quadrant of right breast of female, estrogen receptor positive ([place]) Staging form: Breast, AJCC 8th Edition - Clinical stage from 05/28/2018: Stage IIB (cT2, cN1, cM0, G3, ER+, PR+, HER2-) - Unsigned     Cancer of overlapping sites of right female breast ([place])   05/14/2018 Mammogram    Right breast mammogram 05/14/18  IMPRESSION  There is a 1.0 x 0.5 x 1.2 cm lobular hypoechoic mass right breast 7 o'clock position 3 cm from nipple, a 0.8 x 0.6 x 1.2 cm lobular hypoechoic mass right breast 7 o'clock position 2 cm from the nipple, and a 0.9 x 0.5 x 0.8 cm lobular hypoechoic mass right breast 5 o'clock position 3 cm from nipple.  There is a 3.8 x 1.0 x 1.3 cm lobular hypoechoic mass right breast 5 o'clock position 2 cm from nipple.  Multiple thickened right axillary lymph nodes (approximately 15). Measuring up to 1.0 cm in thickness. Adenopathy corresponds with axillary palpable abnormality.  Indeterminate round and punctate calcifications upper-outer quadrant right breast.Indeterminate round and  punctate calcifications upper-outer quadrant right breast.    05/21/2018 Initial Biopsy    Diagnosis 05/21/18  1. Breast, right, needle core biopsy, axilla, 5 o'clock, ribbon clip - INVASIVE MAMMARY CARCINOMA. - MAMMARY CARCINOMA IN SITU. 2. Breast, right, needle core biopsy, 7 o'clock, coil clip - INVASIVE MAMMARY CARCINOMA. - MAMMARY CARCINOMA IN SITU. 3. Lymph node, needle/core biopsy, right axillary LN, hydromark - METASTATIC MAMMARY CARCINOMA.     05/21/2018 Receptors her2    1. PROGNOSTIC INDICATORS Results: The tumor cells are EQUIVOCAL for Her2 (2+);  HER2 **NEGATIVE** by FISH   Estrogen Receptor: 80%, POSITIVE, MODERATE STAINING INTENSITY Progesterone Receptor: 5%, POSITIVE, MODERATE-WEAK STAINING INTENSITY Proliferation Marker Ki67: 10%  3. PROGNOSTIC INDICATORS Results: The tumor cells are POSITIVE for Her2 (3+). Estrogen Receptor: 90%, POSITIVE, STRONG STAINING INTENSITY Progesterone Receptor: 50%, POSITIVE, MODERATE STAINING INTENSITY Proliferation Marker Ki67: 5%    05/26/2018 Initial Diagnosis    Malignant neoplasm of lower-inner quadrant of right breast of female, estrogen receptor positive ([place])    05/27/2018 Mammogram    Left breast mammogram 05/27/18  IMPRESSION: No mammographic evidence of LEFT breast malignancy.    05/28/2018 Cancer Staging    Staging form: Breast, AJCC 8th Edition - Clinical stage from 05/28/2018: Stage IB (cT2, cN1, cM0, G3, ER+, PR+, HER2+) - Signed by [name], MD on 05/28/2018    06/03/2018 Imaging    MR BREAST BILATERAL W WO CONTRAST INC CAD  IMPRESSION: 1. Large enhancing masses and non mass enhancement identified throughout the majority of the right breast. Additionally, there are smaller masses identified within the upper inner and lower inner right breast. Overall findings are  concerning for extensive malignancy throughout all 4 quadrants of the right breast. 2. Extensive bulky lymphadenopathy identified within the  right axilla. The matted appearance makes counting the number of abnormal lymph nodes difficult as they are immediately approximated to each other. 3. At least one abnormal appearing right internal mammary lymph node. 4. Nonenhancing lesion within the sternum with associated sternal marrow heterogeneity, raising the possibility of osseous metastatic disease within the sternum. 5. Two enhancing masses within the left breast as described above.      06/05/2018 PET scan    NM PET Image Initial (PI) Skull Base To Thigh  IMPRESSION: 1. Multiple borderline enlarged right axillary lymph nodes identified exhibiting mild to moderate increased uptake. Can not rule out nodal metastasis. Correlation with biopsy recommended. 2. Asymmetric increased uptake within the right breast corresponding to MRI abnormality. 3. Subcentimeter right retropectoral and right internal mammary lymph nodes are again identified and exhibit nonspecific, low level FDG uptake. 4. Noncalcified solid nodule within the lateral right lower lobe measures 9 mm without significant radiotracer uptake. 5. No hypermetabolic osseous lesions identified.      06/09/2018 -  Chemotherapy    Neoadjuvant chemotherapyTCHP, 06/09/2018-09/22/18. Followed by Maintenance Herceptin/Perjeta q3weeks starting 10/14/18 to continue 1 year treatment.     06/10/2018 Pathology Results    Surgical pathology  Diagnosis 1. Breast, left, needle core biopsy, LOQ, barbell - MILD FIBROCYSTIC CHANGES. 2. Breast, left, needle core biopsy, UIQ cylinder - MILD FIBROCYSTIC CHANGES.     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 There were no deleterious mutationsinAIP, ALK, APC, ATM, AXIN2,BAP1, BARD1, BLM, BMPR1A, BRCA1, BRCA2, BRIP1, CASR, CDC73, CDH1, CDK4, CDKN1B, CDKN1C,  CDKN2A (p14ARF), CDKN2A (p16INK4a), CEBPA, CHEK2, CTNNA1, DICER1, DIS3L2, EGFR (c.2369C>T, p.Thr790Met variant only), EPCAM (Deletion/duplication testing only), FH, FLCN, GATA2, GPC3, GREM1 (Promoter region deletion/duplication testing only), HOXB13 (c.251G>A, p.Gly84Glu), HRAS, KIT, MAX, MEN1, MET, MITF (c.952G>A, p.Glu318Lys variant only), MLH1, MSH2, MSH3, MSH6, MUTYH, NBN, NF1, NF2, NTHL1, PDGFRA, PHOX2B, PMS2, POLD1, POLE, POT1, PRKAR1A, PTCH1, PTEN, RAD50, RAD51C, RAD51D, RB1, RECQL4, RET, RUNX1, SDHAF2, SDHA (sequence changes only), SDHB, SDHC, SDHD, SMAD4, SMARCA4, SMARCB1, SMARCE1, STK11, SUFU, TERC, TERT, TMEM127, TP53, TSC1, TSC2, VHL, WRN and WT1. .    09/24/2018 Breast MRI    Breast MRI from [facility] on 09/24/18 Right breast: Signal void inferior right breast, posterior depth from biopsy marker clip. Interval decreased size of the right breast, and now symmetric to the left, compared to prior MRI. Resolution of abnormal nonmass enhancement without evidence of residual enhancing mass. No evidence of axillary or internal mammary lymphadenopathy is seen, markedly improved from prior. There is no abnormal skin, nipple, or pectoralis muscle enhancement.  Left breast: There are no suspicious enhancing masses or areas of non-mass enhancement. No evidence of axillary or internal mammary lymphadenopathy is seen. There is no abnormal skin, nipple, or pectoralis muscle enhancement.    10/09/2018 Imaging    CT chest W Contrast 10/09/18  IMPRESSION: 1. Stable 10 mm right lower lobe pulmonary nodule since 06/05/2018. Nodule is not substantially hypermetabolic on the PET-CT. Continued attention on follow-up recommended. 2. No findings to suggest metastatic disease in the thorax.      CURRENT THERAPY:  Neoadjuvant chemotherapyTCHP, 06/09/2018-09/22/18. Followed by Maintenance Herceptin/Perjeta q3weeks starting 10/14/18 to continue 1 year treatment.  INTERVAL HISTORY: Kim [name] 43 y.o. female  returns to the clinic for a follow up visit. The patient  is feeling well today without any concerning complaints. She recently had a right nipple sparing mastectomy under the care of Dr. Ernst Bowler at [facility] on 10/21/2018. She is doing well since her surgery. She is planning on getting her drains removed tomorrow on 7/21 and 11/07/2018. She also was given a prescription for bactrim from her surgery which she has taken as prescribed. She denies any purulent drainage, erythema, or increased pain. She denies any upper or lower extremity swelling.   The patient tolerated her most recent treatment well on 10/14/2018. She denies any recent fevers, chills, lymphadenopathy, or night sweats. She reports a 4 lb weight loss but she states she is eating well. She attributes this weight loss to "improved swelling" and because she is "more active now". She denies any nausea, vomiting, or diarrhea. She has mild constipation for which she uses miralax. She denies any headaches or visual changes. She denies any shortness of breath, cough, or hemoptysis. She denies any numbness, tingling, or weakness. At her last appointment, the patient had experienced a foot fungal infection and eye lid twitching. For her foot, she denies anymore erythema or rashes. She still has some residual dry skin. Regarding her eyelid twitching, she states this is improved but she still experiences symptoms occasionally. She is here today for evaluation before starting her second treatment with maintenance herceptin/perjeta.   MEDICAL HISTORY: Past Medical History:  Diagnosis Date  . Acid reflux   . Acute bronchitis   . Anemia   . Asthma   . Family history of brain cancer   . Family history of breast cancer   . Family history of prostate cancer   . Migraine     ALLERGIES:  has No Known Allergies.  MEDICATIONS:  Current Outpatient Medications  Medication Sig Dispense Refill  . albuterol (PROVENTIL HFA;VENTOLIN HFA) 108 (90 BASE) MCG/ACT inhaler  Inhale 2 puffs into the lungs every 6 (six) hours as needed for wheezing or shortness of breath.    . ALPRAZolam (XANAX) 0.5 MG tablet Take 0.5 mg by mouth daily as needed for anxiety. 3 to 5 times month    . calcium carbonate (TUMS - DOSED IN MG ELEMENTAL CALCIUM) 500 MG chewable tablet Chew 1 tablet by mouth daily as needed. heartburn    . fish oil-omega-3 fatty acids 1000 MG capsule Take 1 g by mouth daily.     . Probiotic Product (PROBIOTIC DAILY PO) Take 1 capsule by mouth daily.     . propranolol (INDERAL) 10 MG tablet Take 5 mg by mouth daily as needed. Takes as needed for shaking hand less than 10 times per year    . sulfamethoxazole-trimethoprim (BACTRIM DS) 800-160 MG tablet Take 1 tablet by mouth every 12 (twelve) hours. For 14 days    . cetirizine (ZYRTEC) 10 MG tablet Take 10 mg by mouth daily as needed for allergies.    . diphenoxylate-atropine (LOMOTIL) 2.5-0.025 MG tablet Take 1-2 tablets by mouth 4 (four) times daily as needed for diarrhea or loose stools. (Patient not taking: Reported on 10/14/2018) 40 tablet 0  . Fluticasone Propionate (FLONASE ALLERGY RELIEF NA) Place into the nose.    . lidocaine-prilocaine (EMLA) cream Apply 1 application topically as needed. (Patient not taking: Reported on 11/03/2018) 30 g 0  . ondansetron (ZOFRAN) 8 MG tablet Take 1 tablet (8 mg total) by mouth every 8 (eight) hours as needed for nausea or vomiting. (Patient not taking: Reported on 11/03/2018) 20 tablet 0  . prochlorperazine (COMPAZINE) 10 MG tablet  TAKE 1 TABLET (10 MG TOTAL) BY MOUTH EVERY 6 (SIX) HOURS AS NEEDED FOR NAUSEA OR VOMITING. (Patient not taking: Reported on 11/03/2018) 30 tablet 0   No current facility-administered medications for this visit.     SURGICAL HISTORY:  Past Surgical History:  Procedure Laterality Date  . BREAST BIOPSY Right 05/23/2018   x3, malignant  . DILATION AND CURETTAGE OF UTERUS     2006 x1, 2009 x2  . LAPAROTOMY  2008  . PORTACATH PLACEMENT Left  06/06/2018   Procedure: INSERTION PORT-A-CATH POSSIBLE ULTRASOUND;  Surgeon: [name], MD;  Location: [facility];  Service: General;  Laterality: Left;    REVIEW OF SYSTEMS:   Review of Systems  Constitutional: Negative for appetite change, chills, fatigue, fever and unexpected weight change.  HENT:   Negative for mouth sores, nosebleeds, sore throat and trouble swallowing.   Eyes: Negative for eye problems and icterus.  Respiratory: Negative for cough, hemoptysis, shortness of breath and wheezing.   Cardiovascular: Positive for right chest pain secondary to her recent surgery. Negative for leg swelling.  Gastrointestinal: Positive for occasional constipation. Negative for abdominal pain, diarrhea, nausea and vomiting.  Genitourinary: Negative for bladder incontinence, difficulty urinating, dysuria, frequency and hematuria.   Musculoskeletal: Negative for back pain, gait problem, neck pain and neck stiffness.  Skin: Positive for dry skin on her foot.  Neurological: Negative for dizziness, extremity weakness, gait problem, headaches, light-headedness and seizures.  Hematological: Negative for adenopathy. Does not bruise/bleed easily.  Psychiatric/Behavioral: Negative for confusion, depression and sleep disturbance. The patient is not nervous/anxious.     PHYSICAL EXAMINATION:  Blood pressure 92/63, pulse 70, temperature 99.1 F (37.3 C), temperature source Oral, resp. rate 17, height '5\' 9"'  (1.753 m), weight 138 lb 4.8 oz (62.7 kg), SpO2 100 %, unknown if currently breastfeeding.  ECOG PERFORMANCE STATUS: 1 - Symptomatic but completely ambulatory  Physical Exam  Constitutional: Oriented to person, place, and time and well-developed, well-nourished, and in no distress.  HENT:  Head: Normocephalic and atraumatic.  Mouth/Throat: Oropharynx is clear and moist. No oropharyngeal exudate.  Eyes: Conjunctivae are normal. Right eye exhibits no discharge. Left eye exhibits no  discharge. No scleral icterus.  Neck: Normal range of motion. Neck supple.  Cardiovascular: Normal rate, regular rhythm, normal heart sounds and intact distal pulses.   Pulmonary/Chest: Effort normal and breath sounds normal. No respiratory distress. No wheezes. No rales.  Abdominal: Soft. Bowel sounds are normal. Exhibits no distension and no mass. There is no tenderness.  Musculoskeletal: Normal range of motion. Exhibits no edema.  Lymphadenopathy:    No cervical adenopathy.  Neurological: Alert and oriented to person, place, and time. Exhibits normal muscle tone. Gait normal. Coordination normal.  Skin: Dry skin on her left foot. Skin is warm and dry. Not diaphoretic. No erythema. No pallor.  Psychiatric: Mood, memory and judgment normal.  Vitals reviewed.  LABORATORY DATA: Lab Results  Component Value Date   WBC 2.1 (L) 11/03/2018   HGB 9.0 (L) 11/03/2018   HCT 28.4 (L) 11/03/2018   MCV 102.5 (H) 11/03/2018   PLT 160 11/03/2018      Chemistry      Component Value Date/Time   NA 141 11/03/2018 0810   K 4.1 11/03/2018 0810   CL 105 11/03/2018 0810   CO2 27 11/03/2018 0810   BUN 13 11/03/2018 0810   CREATININE 0.80 11/03/2018 0810      Component Value Date/Time   CALCIUM 8.9 11/03/2018 0810  ALKPHOS 52 11/03/2018 0810   AST 11 (L) 11/03/2018 0810   ALT 20 11/03/2018 0810   BILITOT 0.3 11/03/2018 0810       RADIOGRAPHIC STUDIES:  Ct Chest Wo Contrast  Result Date: 10/09/2018 CLINICAL DATA:  Breast cancer.  Pulmonary nodule. EXAM: CT CHEST WITHOUT CONTRAST TECHNIQUE: Multidetector CT imaging of the chest was performed following the standard protocol without IV contrast. COMPARISON:  PET-CT 06/05/2018 FINDINGS: Cardiovascular: The heart size is normal. No substantial pericardial effusion. Left Port-A-Cath tip is positioned at the SVC/RA junction. Mediastinum/Nodes: Mediastinum No mediastinal lymphadenopathy. No evidence for gross hilar lymphadenopathy although assessment  is limited by the lack of intravenous contrast on today's study. The esophagus has normal imaging features. There is no axillary lymphadenopathy. Lungs/Pleura: The central tracheobronchial airways are patent. 10 x 7 mm right lower lobe pulmonary nodule (89/8) is not substantially changed when comparing to PET-CT of 06/05/2018. No other suspicious pulmonary nodule or mass. No focal consolidation. No pleural effusion. Upper Abdomen: 12 mm low-density lesion in the posterior right liver has been incompletely visualized but is stable in the interval and measures water attenuation, compatible with cyst. Musculoskeletal: Musculoskeletal IMPRESSION: 1. Stable 10 mm right lower lobe pulmonary nodule since 06/05/2018. Nodule is not substantially hypermetabolic on the PET-CT. Continued attention on follow-up recommended. 2. No findings to suggest metastatic disease in the thorax. Electronically Signed   By: Misty Stanley M.D.   On: 10/09/2018 12:38     ASSESSMENT/PLAN:  Kim [name] is a 43 y.o. 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 [place] also showed 15abnormallymph nodes. -There is discrepancy of her to test results between breast biopsy and axillary lymph node biopsy,we plan to repeat HER2 on her surgical sample. -She was seen by Med Onc Dr. Hiram Comber at [facility] in 05/2018 for second opinion. Theyrecommendedshe proceed with her current neoadjuvant chemoregimen. -ShehasstartedneoadjuvantTCHP on 2/24/20with Udenyca on day 3. She tolerates well with mild nausea,constipation(controlled),progressingdry skin of handsand feetwith cracking and 1 instance of infection, and  progressing fatigue.  -By cycle 4 her right breast mass and right axillary LN was no longer palpable. She has completed planned 6 cycle chemo -Her breast MRI at [facility] was 09/24/18 showed complete response from chemotherapy. She is scheduled to have right mastectomy at [facility] 7/7 -Her CT chest form 10/09/18 with pt which shows stable 9-33m right lung nodule, no change form her PET scan in 05/2018.  Dr. FBurr Medicodiscussed that her lung nodule likely benign given the stable appearance in 4 months, however malignancy is not completely ruled out. Dr. FBurr Medicospoke with IR Dr. YKathlene Cote who feels needle biopsy is feasible, given the size and location. Dr. FBurr Medicorecommended a f/u CT in 3-4 months, or biopsy after she recovers from breast surgery. She opted to have biopsy  -She is recovering well from chemo. Her residual leg swelling is improved at this time. She only experiences mild lower extremity swelling at the end of the day. She was encouraged to continue to elevate her legs when sitting and to use compression stockings. For her eyelid twitching, this has also improved.She still has dry skin on her left foot without any erythema. Her rash has improved. The patient had used OTC antifungal. The patient was advised to notify  us should she develop recurrence of her fungal infection. 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  -She recently had a right mastectomy at [facility] on 10/21/18. Dr. Burr Medico will discuss further evaluation and recommendations at her next appointment on 11/20/2018. The plan Per Dr. Ernestina Penna last note is that the patient will proceed with RT at [facility] through [facility] before completing reconstruction surgery.  -Labs reviewed today, CBC and CMP WNL except WBC 2.1, Hg 9.0, ANC 1.0. I discussed the labs from today with Pharmacy as well as other physician's in the cancer center. She will proceed with maintenance Herceptin/Perjeta today. I reviewed neutropenic precautions with the patient. She knows to contact the clinic if  she develops signs and symptoms of infection.  -Per Dr. Burr Medico, the patient's surgical sample showed ypT1cN2a disease. Given the residual disease, Dr. Burr Medico recommends changing the patient's maintenance treatment to Kadcyla starting from her next cycle in 3 weeks.  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 She has discovered a stronger maternal family history of prostate and breast cancerin her 2nd and 3rd degree relatives. -The patient and Dr. FeBurr Medicoiscussed the risk of future breast cancer from PALB2m21mtion, which depends on her family history.After discussion, they decided it isveryreasonable to proceed with b/l breast mastectomy given her positive family history of cancers. She is agreeable and will have breast reconstruction. She had a mastectomy on 10/21/2018.  -Dr. FenBurr Medicod the patient hadpreviouslyreviewed the risk for other cancers in carriers for PALB2 mutations, including female breast cancer, prostate, ovarian and pancreatic cancer, although these risks have not yet been quantified. While there is an increased risk for other cancers, there are no current guidelines for screening for these cancers, and at this time NCCN does not suggest a discussion of risk reducing salpingo-oophorectomy. -Her mother, 73,14as recently diagnosed with breast cancertriple negative, probably PALB2 mutation related. She will proceed with b/l mastectomy in 09/2018.  3. Asthma  -mild, continuealbuterol inhaler as needed -Given her WBCs the last several weeks, I reviewedinfection precautions and advised herto avoid unnecessary public venues.  -If she develops fever or productive cough with phlegm she can contact our clinic. -stable  4. Diet and fasting  -She was overall stable and able to maintain her weight during chemo.  -Will monitor. The patient states that she is eating well. She has lost a few pounds since her last  appointment but she attributes that to improved swelling and increased activity.   5. Allergy Symptoms  -She had a recent significant episode of sinus congestion and drainage. Has improved recently.  -She will continue to useClaritin,Flonase and continue use of inhaler as needed.  -No recent symptoms reported today 11/03/2018.   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 9mm21mL lung nodule  -Her initial staging PET scan showed a 9 mm solid rounded nodule within the lateral right lower lobe, not hypermetabolic -CT chest from 6/257/65/46ws stable 10mm57mht lung nodule.  -Dr. Feng Burr Medicoussed with the patient that they can biopsy after breast surgery or follow up every 3-6 months by CT scan. She is interested in biopsy after surgery.   8. Leg edema  -likely related to chemo and steroids, resolves in the morning, symmetric, low suspicion for DVT -She will continue wearing compression stocks during the day. Her lower extremity  swelling has greatly improved at this time.    Plan:  -Labs were reviewed with pharmacy and colleagues. Proceed with herceptin/Perjeta today. -The patient's pathology report from [facility] from her recent mastectomy on 10/21/2018 showed significant residual disease. I will contact the patient to update her of Dr. Ernestina Penna new recommendation. The plan is to modify her maintenance adjuvant therapy to Kadcyla starting at her next cycle of treatment.   -labs, flush, f/u and Kadcyla in 3 weeks.   No orders of the defined types were placed in this encounter.    Damali Broadfoot L Maykayla Highley, PA-C 11/03/18

## 2018-11-03 NOTE — Patient Instructions (Signed)

## 2018-11-08 ENCOUNTER — Telehealth: Payer: Self-pay | Admitting: Hematology

## 2018-11-08 ENCOUNTER — Other Ambulatory Visit: Payer: Self-pay | Admitting: Hematology

## 2018-11-08 DIAGNOSIS — C50811 Malignant neoplasm of overlapping sites of right female breast: Secondary | ICD-10-CM

## 2018-11-08 NOTE — Progress Notes (Signed)
DISCONTINUE ON PATHWAY REGIMEN - Breast     A cycle is every 21 days:     Pertuzumab      Pertuzumab      Trastuzumab-xxxx      Trastuzumab-xxxx      Carboplatin      Docetaxel   **Always confirm dose/schedule in your pharmacy ordering system**  REASON: Other Reason PRIOR TREATMENT: [id number]: Docetaxel + Carboplatin + Trastuzumab + Pertuzumab (TCHP) q21 Days x 6 Cycles TREATMENT RESPONSE: Partial Response (PR)  START ON PATHWAY REGIMEN - Breast     A cycle is every 21 days:     Ado-trastuzumab emtansine   **Always confirm dose/schedule in your pharmacy ordering system**  Patient Characteristics: Post-Neoadjuvant Therapy and Resection, HER2 Positive, ER Positive, Residual Disease, Adjuvant Targeted Therapy After Neoadjuvant Chemo/Targeted Therapy Therapeutic Status: Post-Neoadjuvant Therapy and Resection ER Status: Positive (+) HER2 Status: Positive (+) PR Status: Positive (+) Residual Invasive Disease Post-Neoadjuvant Therapy<= Yes Intent of Therapy: Curative Intent, Discussed with Patient

## 2018-11-08 NOTE — Telephone Encounter (Signed)
I called Kim [name] and reviewed Kim [name] surgical path results. Due to Kim [name] multiple positive lymph nodes, I recommend change Kim [name] maintenance therapy from Kim [name]/Kim [name] to Kim [name], starting next cycle on 8/10. I will also request Kim [name] lung nodule biopsy to be schedule in the next 2-4 weeks. She agrees with the plan.  I spoke with [facility] pathologist Dr. Governor Specking and requested Kim [name] breast tumor to be tested for Kim [name] (biopsy Kim [name] was equivocal). The largest tumor Kim [name] IHC was negative, the second largest tumor test result still pending.   I will see Kim [name] back on 11/24/2018.  [name]  11/08/2018

## 2018-11-10 ENCOUNTER — Telehealth: Payer: Self-pay | Admitting: Hematology

## 2018-11-10 NOTE — Telephone Encounter (Signed)
R/s appt per 7/24 sch message - pt aware of new appt date and time

## 2018-11-18 ENCOUNTER — Ambulatory Visit (HOSPITAL_COMMUNITY)
Admission: RE | Admit: 2018-11-18 | Discharge: 2018-11-18 | Disposition: A | Discharge status: Home / Self Care | Payer: BC Managed Care – PPO | Source: Ambulatory Visit | Attending: Hematology | Admitting: Hematology

## 2018-11-18 DIAGNOSIS — Z20828 Contact with and (suspected) exposure to other viral communicable diseases: Secondary | ICD-10-CM | POA: Diagnosis not present

## 2018-11-18 DIAGNOSIS — Z01812 Encounter for preprocedural laboratory examination: Secondary | ICD-10-CM | POA: Insufficient documentation

## 2018-11-18 LAB — SARS CORONAVIRUS 2 (TAT 6-24 HRS): SARS Coronavirus 2: NEGATIVE

## 2018-11-19 ENCOUNTER — Telehealth: Payer: Self-pay

## 2018-11-19 NOTE — Telephone Encounter (Signed)
Left voice message for patient regarding lab results.  Per Dr. Burr Medico COVID test was negative.

## 2018-11-19 NOTE — Telephone Encounter (Signed)
-----   Message from [name], MD sent at 11/19/2018  6:38 AM EDT ----- Please let pt know her COVID test was negative, she is scheduled lung biopsy 8/7  Thanks  Krista Blue

## 2018-11-20 ENCOUNTER — Other Ambulatory Visit: Payer: BC Managed Care – PPO

## 2018-11-20 ENCOUNTER — Ambulatory Visit: Payer: BC Managed Care – PPO

## 2018-11-20 ENCOUNTER — Ambulatory Visit: Payer: BC Managed Care – PPO | Admitting: Hematology

## 2018-11-20 ENCOUNTER — Other Ambulatory Visit: Payer: Self-pay | Admitting: Radiology

## 2018-11-20 NOTE — Progress Notes (Signed)
[place]   [phone] [phone]   Clinic Follow up Note   Patient Care Team: [name], MD as PCP - General (Family Medicine) [name], MD as Consulting Physician (General Surgery) [name], MD as Consulting Physician (Hematology) [name], MD as Consulting Physician (Radiation Oncology) [name], MD as Consulting Physician (Obstetrics and Gynecology) [name], RN as Oncology Nurse Navigator [name], RN as Oncology Nurse Navigator  Date of Service:  11/24/2018  CHIEF COMPLAINT: F/u multifocal right breast cancer  SUMMARY OF ONCOLOGIC HISTORY: Oncology History Overview Note  Cancer Staging Cancer of overlapping sites of right female breast [facility]) Staging form: Breast, AJCC 8th Edition - Clinical stage from 05/28/2018: Stage IB (cT2, cN1, cM0, G3, ER+, PR+, HER2+) - Signed by [name], MD on 05/28/2018  Malignant neoplasm of lower-outer quadrant of right breast of female, estrogen receptor positive ([place]) Staging form: Breast, AJCC 8th Edition - Clinical stage from 05/28/2018: Stage IIB (cT2, cN1, cM0, G3, ER+, PR+, HER2-) - Unsigned     Cancer of overlapping sites of right female breast ([place])  05/14/2018 Mammogram   Right breast mammogram 05/14/18  IMPRESSION  There is a 1.0 x 0.5 x 1.2 cm lobular hypoechoic mass right breast 7 o'clock position 3 cm from nipple, a 0.8 x 0.6 x 1.2 cm lobular hypoechoic mass right breast 7 o'clock position 2 cm from the nipple, and a 0.9 x 0.5 x 0.8 cm lobular hypoechoic mass right breast 5 o'clock position 3 cm from nipple.  There is a 3.8 x 1.0 x 1.3 cm lobular hypoechoic mass right breast 5 o'clock position 2 cm from nipple.  Multiple thickened right axillary lymph nodes (approximately 15). Measuring up to 1.0 cm in thickness. Adenopathy corresponds with axillary palpable abnormality.  Indeterminate round and punctate calcifications upper-outer quadrant right  breast.Indeterminate round and punctate calcifications upper-outer quadrant right breast.   05/21/2018 Initial Biopsy   Diagnosis 05/21/18  1. Breast, right, needle core biopsy, axilla, 5 o'clock, ribbon clip - INVASIVE MAMMARY CARCINOMA. - MAMMARY CARCINOMA IN SITU. 2. Breast, right, needle core biopsy, 7 o'clock, coil clip - INVASIVE MAMMARY CARCINOMA. - MAMMARY CARCINOMA IN SITU. 3. Lymph node, needle/core biopsy, right axillary LN, hydromark - METASTATIC MAMMARY CARCINOMA.    05/21/2018 Receptors her2   1. PROGNOSTIC INDICATORS Results: The tumor cells are EQUIVOCAL for Her2 (2+);  HER2 **NEGATIVE** by FISH   Estrogen Receptor: 80%, POSITIVE, MODERATE STAINING INTENSITY Progesterone Receptor: 5%, POSITIVE, MODERATE-WEAK STAINING INTENSITY Proliferation Marker Ki67: 10%  3. PROGNOSTIC INDICATORS Results: The tumor cells are POSITIVE for Her2 (3+). Estrogen Receptor: 90%, POSITIVE, STRONG STAINING INTENSITY Progesterone Receptor: 50%, POSITIVE, MODERATE STAINING INTENSITY Proliferation Marker Ki67: 5%   05/26/2018 Initial Diagnosis   Malignant neoplasm of lower-inner quadrant of right breast of female, estrogen receptor positive ([place])   05/27/2018 Mammogram   Left breast mammogram 05/27/18  IMPRESSION: No mammographic evidence of LEFT breast malignancy.   05/28/2018 Cancer Staging   Staging form: Breast, AJCC 8th Edition - Clinical stage from 05/28/2018: Stage IB (cT2, cN1, cM0, G3, ER+, PR+, HER2+) - Signed by [name], MD on 05/28/2018   06/03/2018 Imaging   MR BREAST BILATERAL W WO CONTRAST INC CAD  IMPRESSION: 1. Large enhancing masses and non mass enhancement identified throughout the majority of the right breast. Additionally, there are smaller masses identified within the upper inner and lower inner right breast. Overall findings are concerning for extensive malignancy throughout all 4 quadrants of  the right breast. 2. Extensive bulky lymphadenopathy identified within  the right axilla. The matted appearance makes counting the number of abnormal lymph nodes difficult as they are immediately approximated to each other. 3. At least one abnormal appearing right internal mammary lymph node. 4. Nonenhancing lesion within the sternum with associated sternal marrow heterogeneity, raising the possibility of osseous metastatic disease within the sternum. 5. Two enhancing masses within the left breast as described above.     06/05/2018 PET scan   NM PET Image Initial (PI) Skull Base To Thigh  IMPRESSION: 1. Multiple borderline enlarged right axillary lymph nodes identified exhibiting mild to moderate increased uptake. Can not rule out nodal metastasis. Correlation with biopsy recommended. 2. Asymmetric increased uptake within the right breast corresponding to MRI abnormality. 3. Subcentimeter right retropectoral and right internal mammary lymph nodes are again identified and exhibit nonspecific, low level FDG uptake. 4. Noncalcified solid nodule within the lateral right lower lobe measures 9 mm without significant radiotracer uptake. 5. No hypermetabolic osseous lesions identified.     06/09/2018 - 11/03/2018 Chemotherapy   Neoadjuvant chemotherapy TCHP, 06/09/2018-09/22/18. Followed by Maintenance Herceptin/Perjeta q3weeks starting 10/14/18 to continue 1 year treatment. Stopped Herceptin/Perjeta on 11/03/18 due to multiple Positive LN of pathology.    06/10/2018 Pathology Results   Surgical pathology  Diagnosis 1. Breast, left, needle core biopsy, LOQ, barbell - MILD FIBROCYSTIC CHANGES. 2. Breast, left, needle core biopsy, UIQ cylinder - MILD FIBROCYSTIC CHANGES.    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 There were no deleterious mutations in AIP, ALK, APC, ATM, AXIN2,BAP1,  BARD1,  BLM, BMPR1A, BRCA1, BRCA2, BRIP1, CASR, CDC73, CDH1, CDK4, CDKN1B, CDKN1C, CDKN2A (p14ARF), CDKN2A (p16INK4a), CEBPA, CHEK2, CTNNA1, DICER1, DIS3L2, EGFR (c.2369C>T, p.Thr790Met variant only), EPCAM (Deletion/duplication testing only), FH, FLCN, GATA2, GPC3, GREM1 (Promoter region deletion/duplication testing only), HOXB13 (c.251G>A, p.Gly84Glu), HRAS, KIT, MAX, MEN1, MET, MITF (c.952G>A, p.Glu318Lys variant only), MLH1, MSH2, MSH3, MSH6, MUTYH, NBN, NF1, NF2, NTHL1, PDGFRA, PHOX2B, PMS2, POLD1, POLE, POT1, PRKAR1A, PTCH1, PTEN, RAD50, RAD51C, RAD51D, RB1, RECQL4, RET, RUNX1, SDHAF2, SDHA (sequence changes only), SDHB, SDHC, SDHD, SMAD4, SMARCA4, SMARCB1, SMARCE1, STK11, SUFU, TERC, TERT, TMEM127, TP53, TSC1, TSC2, VHL, WRN and WT1.  .   09/24/2018 Breast MRI   Bre[facility] from [facility] on 09/24/18 Right breast: Signal void inferior right breast, posterior depth from biopsy marker clip. Interval decreased size of the right breast, and now symmetric to the left, compared to prior MRI. Resolution of abnormal nonmass enhancement without evidence of residual enhancing mass. No evidence of axillary or internal mammary lymphadenopathy is seen, markedly improved from prior. There is no abnormal skin, nipple, or pectoralis muscle enhancement.  Left breast: There are no suspicious enhancing masses or areas of non-mass enhancement. No evidence of axillary or internal mammary lymphadenopathy is seen. There is no abnormal skin, nipple, or pectoralis muscle enhancement.   10/09/2018 Imaging   CT chest W Contrast 10/09/18  IMPRESSION: 1. Stable 10 mm right lower lobe pulmonary nodule since 06/05/2018. Nodule is not substantially hypermetabolic on the PET-CT. Continued attention on follow-up recommended. 2. No findings to suggest metastatic disease in the thorax.   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 - Biopsy clips identified - Margin status (see extended anterior margin below)  Invasive carcinoma: Positive anterior-inferior margin  Ductal carcinoma in situ: Positive anterior-inferior margin - Ancillary studies previously reported on [id number](5:00, ribbon clip) Estrogen receptor: Positive (80%) Progesterone receptor: Positive (10%) HER2 IHC: Equivocal (2+) HER2 FISH: Negative HER2/CEP17 ratio: 1.35 Mean HER2 copy number: 1.55 - Ancillary studies previously reported on [id number] (right axillary lymph node) Estrogen receptor: Positive (90%) Progesterone receptor: Positive (20%) HER2 IHC: Positive (3+) - Residual Cancer Burden  Tumor bed size: 41 mm x 20 mm Overall tumor cellularity: 10% Percentage in situ: 60% Number of involved lymph nodes: 7 Diameter of largest metastasis: 6 mm Residual Cancer Burden Score: 3.108 Residual Cancer Burden Class: RCB-II - Other findings: Atypical lobular hyperplasia and columnar cell change with microcalcifications; fibroadenomas with microcalcifications  B: Right axilla, targeted lymph node excision - Six of seven lymph nodes positive for carcinoma (6/7) - Size of largest tumor deposit: 6 mm - Biopsy site identified - Treatment effect: Present - Extracapsular extension: Absent  C: Right axilla, lymph node dissection - One of seven lymph nodes positive for carcinoma, macrometastasis, 6 mm (1/7) - Treatment effect: Absent - Extracapsular extension: Absent  D: Right breast, nipple core biopsy - Negative for carcinoma  E: Breast, right, anterior margin, excision - Microscopic foci of ductal carcinoma in situ - Margin: Negative, <1 mm    11/05/2018 Cancer Staging   Staging form: Breast, AJCC 8th Edition -  Pathologic stage from 11/05/2018: No Stage Recommended (ypT1c, pN2a, cM0, G2, ER+, PR+, HER2-) - Signed by [name], MD on 11/23/2018   11/24/2018 -  Chemotherapy   Due to positive LN on surgical pathology her maintance Herceptin/Perjeta was changed to [place] for about 14 cycles on 11/24/18      CURRENT THERAPY:  Due to positive LN on surgical pathology her maintenance Herceptin/Perjeta was changed to Kadcyla q3weeks for about 14 cycles on 11/24/18   INTERVAL HISTORY:  Kim [name] is here for a follow up and treatment. She presents to the clinic alone. She notes post lung biopsy she has pain with deep breaths and neuropathy of her arm. This has improved some. She is also still recovering from right mastectomy. She has limited ROM of right arm currently. She feels carding of right axilla. She has not heard of start date for RT but plans to start in a month.  She notes significant itching of upper extremities. She has scratched so much and broken skin. She has tried benadryl.    REVIEW OF SYSTEMS:   Constitutional: Denies fevers, chills or abnormal weight loss Eyes: Denies blurriness of vision Ears, nose, mouth, throat, and face: Denies mucositis or sore throat Respiratory: Denies cough, dyspnea or wheezes Cardiovascular: Denies palpitation, chest discomfort or lower extremity swelling Gastrointestinal:  Denies nausea, heartburn or change in bowel habits Skin: Denies abnormal skin rashes (+) itching of upper extremities Lymphatics: Denies new lymphadenopathy or easy bruising Neurological: (+) Mild neuropathy of right arm and pain with dep breathing from biopsy  Behavioral/Psych: Mood is stable, no new changes  Breast: (+) Cording of right axilla and limited ROM of right arm s/p surgery  All other systems were reviewed with the patient and are negative.  MEDICAL HISTORY:  Past Medical History:  Diagnosis Date   Acid reflux    Acute  bronchitis    Anemia    Asthma     Family history of brain cancer    Family history of breast cancer    Family history of prostate cancer    Migraine     SURGICAL HISTORY: Past Surgical History:  Procedure Laterality Date   BREAST BIOPSY Right 05/23/2018   x3, malignant   DILATION AND CURETTAGE OF UTERUS     2006 x1, 2009 x2   LAPAROTOMY  2008   PORTACATH PLACEMENT Left 06/06/2018   Procedure: INSERTION PORT-A-CATH POSSIBLE ULTRASOUND;  Surgeon: [name], MD;  Location: [facility];  Service: General;  Laterality: Left;    I have reviewed the social history and family history with the patient and they are unchanged from previous note.  ALLERGIES:  has No Known Allergies.  MEDICATIONS:  Current Outpatient Medications  Medication Sig Dispense Refill   BioGaia Probiotic (BIOGAIA PROBIOTIC) LIQD Take by mouth daily at 8 pm.     cetirizine (ZYRTEC) 10 MG chewable tablet Chew 10 mg by mouth daily.     albuterol (PROVENTIL HFA;VENTOLIN HFA) 108 (90 BASE) MCG/ACT inhaler Inhale 2 puffs into the lungs every 6 (six) hours as needed for wheezing or shortness of breath.     ALPRAZolam (XANAX) 0.5 MG tablet Take 0.25-0.5 mg by mouth at bedtime as needed for anxiety.      diphenhydrAMINE (BENADRYL) 25 mg capsule Take 25 mg by mouth at bedtime as needed for sleep.     ibuprofen (ADVIL) 200 MG tablet Take 400 mg by mouth every 6 (six) hours as needed for headache or moderate pain.     Melatonin 3 MG CAPS Take 3 mg by mouth at bedtime.     propranolol (INDERAL) 10 MG tablet Take 5 mg by mouth daily as needed (for shaking hands).      No current facility-administered medications for this visit.     PHYSICAL EXAMINATION: ECOG PERFORMANCE STATUS: 1 - Symptomatic but completely ambulatory  Vitals:   11/24/18 1204  BP: 102/67  Pulse: 63  Resp: 17  Temp: 99.8 F (37.7 C)  SpO2: 97%   Filed Weights   11/24/18 1204  Weight: 136 lb 12.8 oz (62.1 kg)    GENERAL:alert, no distress and  comfortable SKIN: skin color, texture, turgor are normal, no rashes or significant lesions EYES: normal, Conjunctiva are pink and non-injected, sclera clear  NECK: supple, thyroid normal size, non-tender, without nodularity LYMPH:  no palpable lymphadenopathy in the cervical, axillary  LUNGS: clear to auscultation and percussion with normal breathing effort HEART: regular rate & rhythm and no murmurs and no lower extremity edema ABDOMEN:abdomen soft, non-tender and normal bowel sounds Musculoskeletal:no cyanosis of digits and no clubbing  NEURO: alert & oriented x 3 with fluent speech, no focal motor/sensory deficits BREAST: S/p right mastectomy. No palpable mass, nodules or adenopathy bilaterally. Breast exam benign.   LABORATORY DATA:  I have reviewed the data as listed CBC Latest Ref Rng & Units 11/24/2018 11/21/2018 11/03/2018  WBC 4.0 - 10.5 K/uL 2.4(L) 2.2(L) 2.1(L)  Hemoglobin 12.0 - 15.0 g/dL 10.1(L) 10.6(L) 9.0(L)  Hematocrit 36.0 - 46.0 % 31.6(L) 34.1(L) 28.4(L)  Platelets 150 - 400 K/uL 155 150 160     CMP Latest Ref Rng & Units 11/24/2018 11/03/2018 10/14/2018  Glucose 70 - 99 mg/dL 85 141(H) 91  BUN 6 - 20 mg/dL '13 13 10  ' Creatinine 0.44 - 1.00 mg/dL 0.81 0.80 0.70  Sodium 135 - 145 mmol/L 142 141  142  Potassium 3.5 - 5.1 mmol/L 4.0 4.1 4.1  Chloride 98 - 111 mmol/L 106 105 106  CO2 22 - 32 mmol/L '25 27 27  ' Calcium 8.9 - 10.3 mg/dL 9.6 8.9 9.0  Total Protein 6.5 - 8.1 g/dL 6.9 6.4(L) 6.8  Total Bilirubin 0.3 - 1.2 mg/dL 0.3 0.3 0.3  Alkaline Phos 38 - 126 U/L 60 52 55  AST 15 - 41 U/L 10(L) 11(L) 17  ALT 0 - 44 U/L '15 20 27      ' RADIOGRAPHIC STUDIES: I have personally reviewed the radiological images as listed and agreed with the findings in the report. No results found.   ASSESSMENT & PLAN:  Kim [name] is a 43 y.o. 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 [place] also showed 15abnormallymph nodes. -There is discrepancy of her to test results between breast biopsy and axillary lymph node biopsy --Shehascompleted Neoadjuvant TCHP and startedmaintenance Herceptin/Perjeta. She overall had good response to chemo as seen on Breast MRI. She also has 37m right lung nodule, which has been stable --[facility] Pathology shows tumor was completely resected however she had multiple (7) positive LNs which predicts high risk of recurrence. HER2 was repeated in 2 of the primary breast tumor which were all negative. -To reduce her risk of recurrence I recommended changing her maintenance Herceptin/Perjeta to Kadcyla q3weeks for 14 treatments starting today. [facility] --Chemotherapy consent: Side effects including but does not not limited to, fatigue, nausea, vomiting, diarrhea, neuropathy, fluid retention, renal and kidney dysfunction, neutropenic fever, needed for blood transfusion, bleeding, were discussed with patient in great detail. She agrees to proceed. -To reduce her risk of local recurrence [facility].  -Given ER/PR positive disease, I recommend antiestrogen therapy. I discussed AI is more beneficial than Tamoxifen given her young age and high risk disease, given she is still prememopausal. I recommended Ovarian suppression with Zoladex injections to start AI. She is agreeable and plan to have BSO in future. She is little anxious, and I will start her Zoladex in 3 weeks -Given higher risk of recurrence and HER2 positive LNs I also briefly  discussed she is eligible for Neralyx which is oral anti-Her2 treatment. She will start after she completes kaycyla  -Labs reviewed, CBC and CMP WNL except WBC 2.4, HG 10.1, ANC 1.0. Overall adequate to proceed with start of Kadcyla today. She still has mild residual neutropenia  -plan to start Zoladex in 3 weeks, and AI in 6 weeks. 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 She has discovered a stronger maternal family history of prostate and breast cancerin her 2nd and 3rd degree relatives. -We discussed the risk of future breast cancer from PALB2mu54mion, which depends on her family history.I thinkit isveryreasonable to proceed with b/l breast mastectomy given her positive family history of cancers. She is agreeable and will have breast reconstruction. -Wepreviouslyreviewed the risk for other cancers in carriers for PALB2 mutations, including female breast cancer, prostate, ovarian and pancreatic cancer, although these risks have not yet been  quantified. While there is an increased risk for other cancers, there are no current guidelines for screening for these cancers, and at this time NCCN does not suggest a discussion of risk reducing salpingo-oophorectomy. -Her mother, 72, was recently diagnosed with breast cancertriple negative, probably PALB2 mutation related. She will proceed with b/l mastectomy in 09/2018.Her Sister had an abnormal breast MRI and is undergoing biopsy.   3. Asthma  -mild, continuealbuterol inhaler as needed -I reviewedsheis higherrisk for infection. I discussedinfection precautions and advised herto avoid unnecessary public venues.  -If she develops fever or productive cough with phlegm she can contact our clinic. -stable  4. Diet and fasting  -She was overall stable and able to maintain her weight during chemo.  -Will monitor. She has been able to gain weight lately.  5.  Allergy Symptoms  -She had a recent significant episode of sinus congestion and drainage. Has improved recently.  -I encouraged her to useClaritin,Flonase and continue use of inhaler as needed.  -some of her symptoms could also be related to Docetaxel -She has had recent skin itching of only upper extremities. I recommend she try benadryl and OTC hydrocortisone cream.   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 35m RLL lung nodule  -Her initial staging PET scan showed a 9 mm solid rounded nodule within the lateral right lower lobe, not hypermetabolic -CT chest from 68/01/65shows stable 162mright lung nodule.  -Her attempted lung biopsy was unsuccessful. Will monitor with follow up CT scan in 3 months  -Lungs normal on exam today (11/24/18)   Plan -Labs reviewed and adequate to proceed with Kadcyla today  -Lab and Kadcyla in 3 and 6 weeks  -start Zoladex in 3 weeks -F/u in 6 weeks, plan to start AI in 7 weeks, will order CT chest on next visit   No problem-specific Assessment & Plan notes found for this encounter.   No orders of the defined types were placed in this encounter.  All questions were answered. The patient knows to call the clinic with any problems, questions or concerns. No barriers to learning was detected. I spent 25 minutes counseling the patient face to face. The total time spent in the appointment was 30 minutes and more than 50% was on counseling and review of test results     YaTruitt MerleMD 11/24/2018   I, AmJoslyn Devonam acting as scribe for YaTruitt MerleMD.   I have reviewed the above documentation for accuracy and completeness, and I agree with the above.

## 2018-11-21 ENCOUNTER — Ambulatory Visit (HOSPITAL_COMMUNITY)
Admission: RE | Admit: 2018-11-21 | Discharge: 2018-11-21 | Disposition: A | Discharge status: Home / Self Care | Payer: BC Managed Care – PPO | Source: Ambulatory Visit | Attending: Diagnostic Radiology | Admitting: Diagnostic Radiology

## 2018-11-21 ENCOUNTER — Ambulatory Visit (HOSPITAL_COMMUNITY)
Admission: RE | Admit: 2018-11-21 | Discharge: 2018-11-21 | Disposition: A | Discharge status: Home / Self Care | Payer: BC Managed Care – PPO | Source: Ambulatory Visit | Attending: Hematology | Admitting: Hematology

## 2018-11-21 ENCOUNTER — Telehealth: Payer: Self-pay | Admitting: Student

## 2018-11-21 ENCOUNTER — Encounter (HOSPITAL_COMMUNITY): Payer: Self-pay

## 2018-11-21 DIAGNOSIS — Z79899 Other long term (current) drug therapy: Secondary | ICD-10-CM | POA: Insufficient documentation

## 2018-11-21 DIAGNOSIS — Z808 Family history of malignant neoplasm of other organs or systems: Secondary | ICD-10-CM | POA: Insufficient documentation

## 2018-11-21 DIAGNOSIS — Z803 Family history of malignant neoplasm of breast: Secondary | ICD-10-CM | POA: Diagnosis not present

## 2018-11-21 DIAGNOSIS — F419 Anxiety disorder, unspecified: Secondary | ICD-10-CM | POA: Insufficient documentation

## 2018-11-21 DIAGNOSIS — Z8042 Family history of malignant neoplasm of prostate: Secondary | ICD-10-CM | POA: Insufficient documentation

## 2018-11-21 DIAGNOSIS — J45909 Unspecified asthma, uncomplicated: Secondary | ICD-10-CM | POA: Diagnosis not present

## 2018-11-21 DIAGNOSIS — C50811 Malignant neoplasm of overlapping sites of right female breast: Secondary | ICD-10-CM | POA: Insufficient documentation

## 2018-11-21 DIAGNOSIS — Z170 Estrogen receptor positive status [ER+]: Secondary | ICD-10-CM | POA: Diagnosis not present

## 2018-11-21 DIAGNOSIS — Z9889 Other specified postprocedural states: Secondary | ICD-10-CM

## 2018-11-21 LAB — CBC
HCT: 34.1 % — ABNORMAL LOW (ref 36.0–46.0)
Hemoglobin: 10.6 g/dL — ABNORMAL LOW (ref 12.0–15.0)
MCH: 31.8 pg (ref 26.0–34.0)
MCHC: 31.1 g/dL (ref 30.0–36.0)
MCV: 102.4 fL — ABNORMAL HIGH (ref 80.0–100.0)
Platelets: 150 10*3/uL (ref 150–400)
RBC: 3.33 MIL/uL — ABNORMAL LOW (ref 3.87–5.11)
RDW: 12 % (ref 11.5–15.5)
WBC: 2.2 10*3/uL — ABNORMAL LOW (ref 4.0–10.5)
nRBC: 0 % (ref 0.0–0.2)

## 2018-11-21 LAB — PROTIME-INR
INR: 1 (ref 0.8–1.2)
Prothrombin Time: 13.2 seconds (ref 11.4–15.2)

## 2018-11-21 MED ORDER — FENTANYL CITRATE (PF) 100 MCG/2ML IJ SOLN
INTRAMUSCULAR | Status: AC
  Filled 2018-11-21: qty 2

## 2018-11-21 MED ORDER — HYDROCODONE-ACETAMINOPHEN 5-325 MG PO TABS
1.0000 | ORAL_TABLET | ORAL | Status: DC | PRN
  Filled 2018-11-21: qty 2

## 2018-11-21 MED ORDER — SODIUM CHLORIDE 0.9 % IV SOLN
INTRAVENOUS | Status: AC | PRN
  Administered 2018-11-21: 10 mL/h via INTRAVENOUS

## 2018-11-21 MED ORDER — FENTANYL CITRATE (PF) 100 MCG/2ML IJ SOLN
INTRAMUSCULAR | Status: AC | PRN
  Administered 2018-11-21: 50 ug via INTRAVENOUS
  Administered 2018-11-21: 25 ug via INTRAVENOUS

## 2018-11-21 MED ORDER — MIDAZOLAM HCL 2 MG/2ML IJ SOLN
INTRAMUSCULAR | Status: AC
  Filled 2018-11-21: qty 2

## 2018-11-21 MED ORDER — MIDAZOLAM HCL 2 MG/2ML IJ SOLN
INTRAMUSCULAR | Status: AC | PRN
  Administered 2018-11-21 (×2): 0.5 mg via INTRAVENOUS
  Administered 2018-11-21: 1 mg via INTRAVENOUS

## 2018-11-21 MED ORDER — LIDOCAINE HCL 1 % IJ SOLN
INTRAMUSCULAR | Status: AC
  Filled 2018-11-21: qty 20

## 2018-11-21 MED ORDER — SODIUM CHLORIDE 0.9 % IV SOLN: INTRAVENOUS | Status: DC

## 2018-11-21 NOTE — Discharge Instructions (Signed)
Lung Biopsy ° °A lung biopsy is a procedure to remove a tissue sample from the lung. The tissue can be examined under a microscope to help diagnose various lung disorders. °There are three types of lung biopsies: °· Needle biopsy. In this type, the sample is removed with a needle. °· Bronchoscopy. In this type, the sample is removed through a flexible tube inserted into the lungs (bronchoscope). °· Open biopsy. In this type, the sample is removed through an incision made in the chest. °Tell a health care provider about: °· Any allergies you have. °· All medicines you are taking, including vitamins, herbs, eye drops, creams, and over-the-counter medicines. °· Any problems you or family members have had with anesthetic medicines. °· Any blood disorders or bleeding problems that you have. °· Any surgeries you have had. °· Any medical conditions you have. °· Whether you are pregnant or may be pregnant. °What are the risks? °Generally, this is a safe procedure. However, problems may occur, including: °· Collapsed lung. °· Bleeding. °· Infection. °· Pain. °What happens before the procedure? °Staying hydrated °Follow instructions from your health care provider about hydration, which may include: °· Up to 2 hours before the procedure - you may continue to drink clear liquids, such as water, clear fruit juice, black coffee, and plain tea. °Eating and drinking restrictions °Follow instructions from your health care provider about eating and drinking, which may include: °· 8 hours before the procedure - stop eating heavy meals or foods such as meat, fried foods, or fatty foods. °· 6 hours before the procedure - stop eating light meals or foods, such as toast or cereal. °· 6 hours before the procedure - stop drinking milk or drinks that contain milk. °· 2 hours before the procedure - stop drinking clear liquids. °General instructions °· Ask your health care provider about: °? Changing or stopping your regular medicines. This is  especially important if you take diabetes medicines or blood thinners. °? Taking medicines such as aspirin and ibuprofen. These medicines can thin your blood. Do not take these medicines before your procedure if your health care provider instructs you not to. °· Plan to have someone take you home from the hospital or clinic. °What happens during the procedure? °· To lower your risk of infection: °? Your health care team will wash or sanitize their hands. °? If you are having an open biopsy, your skin will be washed with soap. °· An IV tube may be inserted into one of your veins. °· You may be given one or both of the following: °? A medicine to help you relax (sedative) during the procedure. °? A medicine to numb the area where the biopsy sample will be taken (local anesthetic). °? A medicine to make you sleep through the procedure (general anesthetic). °· If you have a needle biopsy: °? A biopsy needle will be inserted into your lung. A CT scanner may be used to guide the needle to the right place. °? The needle will be used to collect the tissue sample. °· If you have bronchoscopy: °? A bronchoscope will be inserted into your lungs through your mouth or nose. °? A needle or forceps will be passed through the bronchoscope to remove the tissue sample. °· If you have an open biopsy: °? An incision will be made in your chest. °? The tissue sample will be removed using surgical tools. °? The incision will be closed with skin glue, skin adhesive strips, or stitches (sutures). °The   procedure may vary among health care providers and hospitals. °What happens after the procedure? °· Your blood pressure, heart rate, breathing rate, and blood oxygen level will be monitored until the medicines you were given have worn off. °· If a needle biopsy was performed, a bandage (dressing) will be applied over the area where the needle was inserted. You may be asked to apply pressure to the bandage for several minutes to ensure there is  minimal bleeding. °· If a bronchoscope was used, you may have a cough and some soreness in your throat. °· Do not drive for 24 hours if you were given a sedative. °Summary °· A lung biopsy is a procedure to remove a tissue sample from your lung. The sample is examined to help diagnose various lung disorders. °· A lung biopsy may be done using a needle, by inserting a tube into the lungs, or through an incision in the chest. °· After your lung biopsy, you may have a cough and some throat soreness. °This information is not intended to replace advice given to you by your health care provider. Make sure you discuss any questions you have with your health care provider. °Document Released: 06/21/2004 Document Revised: 03/15/2017 Document Reviewed: 02/22/2016 °Elsevier Patient Education © 2020 [employer] °Moderate Conscious Sedation, Adult, Care After °These instructions provide you with information about caring for yourself after your procedure. Your health care provider may also give you more specific instructions. Your treatment has been planned according to current medical practices, but problems sometimes occur. Call your health care provider if you have any problems or questions after your procedure. °What can I expect after the procedure? °After your procedure, it is common: °· To feel sleepy for several hours. °· To feel clumsy and have poor balance for several hours. °· To have poor judgment for several hours. °· To vomit if you eat too soon. °Follow these instructions at home: °For at least 24 hours after the procedure: ° °· Do not: °? Participate in activities where you could fall or become injured. °? Drive. °? Use heavy machinery. °? Drink alcohol. °? Take sleeping pills or medicines that cause drowsiness. °? Make important decisions or sign legal documents. °? Take care of children on your own. °· Rest. °Eating and drinking °· Follow the diet recommended by your health care provider. °· If you  vomit: °? Drink water, juice, or soup when you can drink without vomiting. °? Make sure you have little or no nausea before eating solid foods. °General instructions °· Have a responsible adult stay with you until you are awake and alert. °· Take over-the-counter and prescription medicines only as told by your health care provider. °· If you smoke, do not smoke without supervision. °· Keep all follow-up visits as told by your health care provider. This is important. °Contact a health care provider if: °· You keep feeling nauseous or you keep vomiting. °· You feel light-headed. °· You develop a rash. °· You have a fever. °Get help right away if: °· You have trouble breathing. °This information is not intended to replace advice given to you by your health care provider. Make sure you discuss any questions you have with your health care provider. °Document Released: 01/21/2013 Document Revised: 03/15/2017 Document Reviewed: 07/23/2015 °Elsevier Patient Education © 2020 [employer] ° °

## 2018-11-21 NOTE — H&P (Signed)
Chief Complaint: Patient was seen in consultation today for lung nodule biopsy at the request of Feng,Yan  Referring Physician(s): Feng,Yan  Supervising Physician: Markus Daft  Patient Status: [facility] - Out-pt  History of Present Illness: Kim [name] is a 43 y.o. female with breast cancer. She is found to have RLL pulm nodule on her imaging and is referred for biopsy. PMHx, meds, labs, imaging, allergies reviewed. Feels well, no recent fevers, chills, illness. Has been NPO today as directed.   Past Medical History:  Diagnosis Date  . Acid reflux   . Acute bronchitis   . Anemia   . Asthma   . Family history of brain cancer   . Family history of breast cancer   . Family history of prostate cancer   . Migraine     Past Surgical History:  Procedure Laterality Date  . BREAST BIOPSY Right 05/23/2018   x3, malignant  . DILATION AND CURETTAGE OF UTERUS     2006 x1, 2009 x2  . LAPAROTOMY  2008  . PORTACATH PLACEMENT Left 06/06/2018   Procedure: INSERTION PORT-A-CATH POSSIBLE ULTRASOUND;  Surgeon: [name], MD;  Location: [place];  Service: General;  Laterality: Left;    Allergies: Patient has no known allergies.  Medications: Prior to Admission medications   Medication Sig Start Date End Date Taking? Authorizing Provider  ALPRAZolam Duanne Moron) 0.5 MG tablet Take 0.25-0.5 mg by mouth at bedtime as needed for anxiety.    Yes [provider]  diphenhydrAMINE (BENADRYL) 25 mg capsule Take 25 mg by mouth at bedtime as needed for sleep.   Yes [provider]  ibuprofen (ADVIL) 200 MG tablet Take 400 mg by mouth every 6 (six) hours as needed for headache or moderate pain.   Yes [provider]  Melatonin 3 MG CAPS Take 3 mg by mouth at bedtime.   Yes [provider]  albuterol (PROVENTIL HFA;VENTOLIN HFA) 108 (90 BASE) MCG/ACT inhaler Inhale 2 puffs into the lungs every 6 (six) hours as needed for wheezing or shortness  of breath.    [provider]  propranolol (INDERAL) 10 MG tablet Take 5 mg by mouth daily as needed (for shaking hands).     [provider]     Family History  Problem Relation Age of Onset  . Hyperlipidemia Father   . Cancer Sister 43       Astrocytoma and Meningioma  . Cancer Maternal Aunt        pitutory cancer  . Breast cancer Maternal Great-grandmother 43       MGFs mother  . Prostate cancer Maternal Grandfather 95  . Prostate cancer Maternal Uncle 65  . Skin cancer Maternal Grandmother   . Cancer - Other Other        pituitary, MGMs sister, died in her 61s  . Breast cancer Other        PGFs sister    Social History   Socioeconomic History  . Marital status: Married    Spouse name: Not on file  . Number of children: 3  . Years of education: Not on file  . Highest education level: Not on file  Occupational History  . Not on file  Social Needs  . Financial resource strain: Not on file  . Food insecurity    Worry: Not on file    Inability: Not on file  . Transportation needs    Medical: Not on file    Non-medical: Not on file  Tobacco Use  . Smoking status: Never Smoker  . Smokeless tobacco: Never Used  Substance and Sexual Activity  . Alcohol use: Not Currently    Comment: 3-4 drinks a week, wine and beer   . Drug use: No  . Sexual activity: Not Currently    Birth control/protection: I.U.D.  Lifestyle  . Physical activity    Days per week: Not on file    Minutes per session: Not on file  . Stress: Not on file  Relationships  . Social Herbalist on phone: Not on file    Gets together: Not on file    Attends religious service: Not on file    Active member of club or organization: Not on file    Attends meetings of clubs or organizations: Not on file    Relationship status: Not on file  Other Topics Concern  . Not on file  Social History Narrative  . Not on file    Review of Systems: A 12 point ROS discussed and  pertinent positives are indicated in the HPI above.  All other systems are negative.  Review of Systems  Vital Signs: BP 101/68   Pulse (!) 53   Temp 97.8 F (36.6 C) (Oral)   Resp 16   Ht 5\' 9"  (1.753 m)   Wt 63.5 kg   SpO2 99%   BMI 20.67 kg/m   Physical Exam Constitutional:      Appearance: Normal appearance.  HENT:     Mouth/Throat:     Mouth: Mucous membranes are moist.     Pharynx: Oropharynx is clear.  Cardiovascular:     Rate and Rhythm: Normal rate and regular rhythm.     Heart sounds: Normal heart sounds.  Pulmonary:     Effort: Pulmonary effort is normal. No respiratory distress.     Breath sounds: Normal breath sounds.  Skin:    General: Skin is warm and dry.  Neurological:     General: No focal deficit present.     Mental Status: She is alert and oriented to person, place, and time.  Psychiatric:        Mood and Affect: Mood normal.        Judgment: Judgment normal.     Imaging: No results found.  Labs:  CBC: Recent Labs    09/22/18 0830 10/14/18 1038 11/03/18 0810 11/21/18 0615  WBC 3.1* 3.0* 2.1* 2.2*  HGB 9.7* 10.4* 9.0* 10.6*  HCT 29.8* 32.2* 28.4* 34.1*  PLT 169 193 160 150    COAGS: Recent Labs    11/21/18 0615  INR 1.0    BMP: Recent Labs    09/01/18 0809 09/22/18 0830 10/14/18 1038 11/03/18 0810  NA 139 140 142 141  K 4.0 3.4* 4.1 4.1  CL 102 103 106 105  CO2 25 25 27 27   GLUCOSE 84 73 91 141*  BUN 13 11 10 13   CALCIUM 9.5 9.1 9.0 8.9  CREATININE 0.71 0.70 0.70 0.80  GFRNONAA >60 >60 >60 >60  GFRAA >60 >60 >60 >60    LIVER FUNCTION TESTS: Recent Labs    09/01/18 0809 09/22/18 0830 10/14/18 1038 11/03/18 0810  BILITOT 0.6 0.5 0.3 0.3  AST 11* 14* 17 11*  ALT 24 20 27 20   ALKPHOS 56 55 55 52  PROT 6.6 6.4* 6.8 6.4*  ALBUMIN 4.0 4.0 3.9 3.9    TUMOR MARKERS: No results for input(s): AFPTM, CEA, CA199, CHROMGRNA in the last 8760 hours.  Assessment  and Plan: RLL pulmonary nodule in setting of breast  cancer For CT guided biopsy Labs pending Risks and benefits of CT guided lung nodule biopsy was discussed with the patient including, but not limited to bleeding, hemoptysis, respiratory failure requiring intubation, infection, pneumothorax requiring chest tube placement, stroke from air embolism or even death.  All of the patient's questions were answered and the patient is agreeable to proceed.  Consent signed and in chart.    Thank you for this interesting consult.  I greatly enjoyed meeting Karine Garn and look forward to participating in their care.  A copy of this report was sent to the requesting provider on this date.  Electronically Signed: Ascencion Dike, PA-C 11/21/2018, 7:34 AM   I spent a total of 25 minutes in face to face in clinical consultation, greater than 50% of which was counseling/coordinating care for lung biopsy

## 2018-11-21 NOTE — Telephone Encounter (Signed)
Patient called to report pain after biopsy today.  States she is breathing well, although has pain when she tries to take a deep breath.  She has no shortness of breath, no cough, no nausea, no dizziness.  She recovered well in Short Stay prior to leaving for home.  No hemoptysis.  Her post-procedure CXR was stable without pneumothorax.    Discussed with Dr. Anselm Pancoast.  Procedure was uncomplicated and patietn was reportedly stable post-procedure prior to discharge home.  As patient is complaining of pain only at this time this likely represents post-procedure pain and referred pain.    Discussed further with patient.  Instructed to continue monitoring symptoms, treat pain conservatively as needed.  If she develops worsening of symptoms, shortness of breath, increased work of breathing she should present to the ED for further evaluation.  Patient verbalizes understanding of care plan.  Brynda Greathouse, MS RD PA-C 1:15 PM

## 2018-11-21 NOTE — Procedures (Signed)
Interventional Radiology Procedure:   Indications: Breast cancer and indeterminate right lung nodule  Procedure: CT biopsy of lung RLL nodule  Findings: Small peripheral nodule.  Very difficult to puncture nodule with needle.  Unable to get biopsy samples.    Complications: None     EBL: less than 10 ml  Plan: CXR in one hour and anticipate discharge in 2 hours if asymptomatic and negative CXR.     Aarini Slee R. Anselm Pancoast, MD  Pager: [phone]

## 2018-11-24 ENCOUNTER — Inpatient Hospital Stay: Payer: BC Managed Care – PPO | Attending: Hematology

## 2018-11-24 ENCOUNTER — Inpatient Hospital Stay: Payer: BC Managed Care – PPO

## 2018-11-24 ENCOUNTER — Encounter: Payer: Self-pay | Admitting: Hematology

## 2018-11-24 ENCOUNTER — Other Ambulatory Visit: Payer: Self-pay

## 2018-11-24 ENCOUNTER — Inpatient Hospital Stay (HOSPITAL_BASED_OUTPATIENT_CLINIC_OR_DEPARTMENT_OTHER): Payer: BC Managed Care – PPO | Admitting: Hematology

## 2018-11-24 VITALS — BP 102/67 | HR 63 | Temp 99.8°F | Resp 17 | Ht 69.0 in | Wt 136.8 lb

## 2018-11-24 DIAGNOSIS — C50811 Malignant neoplasm of overlapping sites of right female breast: Secondary | ICD-10-CM

## 2018-11-24 DIAGNOSIS — Z170 Estrogen receptor positive status [ER+]: Secondary | ICD-10-CM | POA: Insufficient documentation

## 2018-11-24 DIAGNOSIS — R911 Solitary pulmonary nodule: Secondary | ICD-10-CM | POA: Insufficient documentation

## 2018-11-24 DIAGNOSIS — Z5112 Encounter for antineoplastic immunotherapy: Secondary | ICD-10-CM | POA: Diagnosis not present

## 2018-11-24 DIAGNOSIS — D6481 Anemia due to antineoplastic chemotherapy: Secondary | ICD-10-CM | POA: Insufficient documentation

## 2018-11-24 DIAGNOSIS — Z95828 Presence of other vascular implants and grafts: Secondary | ICD-10-CM

## 2018-11-24 DIAGNOSIS — J45909 Unspecified asthma, uncomplicated: Secondary | ICD-10-CM | POA: Diagnosis not present

## 2018-11-24 LAB — CMP (CANCER CENTER ONLY)
ALT: 15 U/L (ref 0–44)
AST: 10 U/L — ABNORMAL LOW (ref 15–41)
Albumin: 4.4 g/dL (ref 3.5–5.0)
Alkaline Phosphatase: 60 U/L (ref 38–126)
Anion gap: 11 (ref 5–15)
BUN: 13 mg/dL (ref 6–20)
CO2: 25 mmol/L (ref 22–32)
Calcium: 9.6 mg/dL (ref 8.9–10.3)
Chloride: 106 mmol/L (ref 98–111)
Creatinine: 0.81 mg/dL (ref 0.44–1.00)
GFR, Est AFR Am: >60 mL/min (ref >60)
GFR, Estimated: >60 mL/min (ref >60)
Glucose, Bld: 85 mg/dL (ref 70–99)
Potassium: 4 mmol/L (ref 3.5–5.1)
Sodium: 142 mmol/L (ref 135–145)
Total Bilirubin: 0.3 mg/dL (ref 0.3–1.2)
Total Protein: 6.9 g/dL (ref 6.5–8.1)

## 2018-11-24 LAB — CBC WITH DIFFERENTIAL (CANCER CENTER ONLY)
Abs Immature Granulocytes: 0 10*3/uL (ref 0.00–0.07)
Basophils Absolute: 0 10*3/uL (ref 0.0–0.1)
Basophils Relative: 1 %
Eosinophils Absolute: 0.1 10*3/uL (ref 0.0–0.5)
Eosinophils Relative: 4 %
HCT: 31.6 % — ABNORMAL LOW (ref 36.0–46.0)
Hemoglobin: 10.1 g/dL — ABNORMAL LOW (ref 12.0–15.0)
Immature Granulocytes: 0 %
Lymphocytes Relative: 46 %
Lymphs Abs: 1.1 10*3/uL (ref 0.7–4.0)
MCH: 31.4 pg (ref 26.0–34.0)
MCHC: 32 g/dL (ref 30.0–36.0)
MCV: 98.1 fL (ref 80.0–100.0)
Monocytes Absolute: 0.2 10*3/uL (ref 0.1–1.0)
Monocytes Relative: 8 %
Neutro Abs: 1 10*3/uL — ABNORMAL LOW (ref 1.7–7.7)
Neutrophils Relative %: 41 %
Platelet Count: 155 10*3/uL (ref 150–400)
RBC: 3.22 MIL/uL — ABNORMAL LOW (ref 3.87–5.11)
RDW: 12 % (ref 11.5–15.5)
WBC Count: 2.4 10*3/uL — ABNORMAL LOW (ref 4.0–10.5)
nRBC: 0 % (ref 0.0–0.2)

## 2018-11-24 LAB — PREALBUMIN: Prealbumin: 21.8 mg/dL (ref 18–38)

## 2018-11-24 MED ORDER — SODIUM CHLORIDE 0.9 % IV SOLN
Freq: Once | INTRAVENOUS | Status: AC
  Administered 2018-11-24: 13:00:00 via INTRAVENOUS
  Filled 2018-11-24: qty 250

## 2018-11-24 MED ORDER — SODIUM CHLORIDE 0.9% FLUSH
10.0000 mL | INTRAVENOUS | Status: DC | PRN
  Administered 2018-11-24: 10 mL
  Filled 2018-11-24: qty 10

## 2018-11-24 MED ORDER — HEPARIN SOD (PORK) LOCK FLUSH 100 UNIT/ML IV SOLN
500.0000 [IU] | Freq: Once | INTRAVENOUS | Status: AC | PRN
  Administered 2018-11-24: 500 [IU]
  Filled 2018-11-24: qty 5

## 2018-11-24 MED ORDER — ACETAMINOPHEN 325 MG PO TABS
ORAL_TABLET | ORAL | Status: AC
  Filled 2018-11-24: qty 2

## 2018-11-24 MED ORDER — DIPHENHYDRAMINE HCL 25 MG PO CAPS
50.0000 mg | ORAL_CAPSULE | Freq: Once | ORAL | Status: AC
  Administered 2018-11-24: 50 mg via ORAL

## 2018-11-24 MED ORDER — ACETAMINOPHEN 325 MG PO TABS
650.0000 mg | ORAL_TABLET | Freq: Once | ORAL | Status: AC
  Administered 2018-11-24: 650 mg via ORAL

## 2018-11-24 MED ORDER — SODIUM CHLORIDE 0.9 % IV SOLN
3.6000 mg/kg | Freq: Once | INTRAVENOUS | Status: AC
  Administered 2018-11-24: 220 mg via INTRAVENOUS
  Filled 2018-11-24: qty 8

## 2018-11-24 MED ORDER — DIPHENHYDRAMINE HCL 25 MG PO CAPS
ORAL_CAPSULE | ORAL | Status: AC
  Filled 2018-11-24: qty 2

## 2018-11-24 NOTE — Progress Notes (Signed)
Per Dr. Burr Medico: OK to treat today with ANC of 1.0

## 2018-11-24 NOTE — Patient Instructions (Signed)
[facility] Discharge Instructions for Patients Receiving Chemotherapy  Today you received the following chemotherapy agents: Ado-Trastuzumab Emtansine (Kadcyla)  To help prevent nausea and vomiting after your treatment, we encourage you to take your nausea medication as directed.   If you develop nausea and vomiting that is not controlled by your nausea medication, call the clinic.   BELOW ARE SYMPTOMS THAT SHOULD BE REPORTED IMMEDIATELY:  *FEVER GREATER THAN 100.5 F  *CHILLS WITH OR WITHOUT FEVER  NAUSEA AND VOMITING THAT IS NOT CONTROLLED WITH YOUR NAUSEA MEDICATION  *UNUSUAL SHORTNESS OF BREATH  *UNUSUAL BRUISING OR BLEEDING  TENDERNESS IN MOUTH AND THROAT WITH OR WITHOUT PRESENCE OF ULCERS  *URINARY PROBLEMS  *BOWEL PROBLEMS  UNUSUAL RASH Items with * indicate a potential emergency and should be followed up as soon as possible.  Feel free to call the clinic should you have any questions or concerns. The clinic phone number is [phone].  Please show the [facility] at check-in to the Emergency Department and triage nurse.  Ado-Trastuzumab Emtansine for injection What is this medicine? ADO-TRASTUZUMAB EMTANSINE (ADD oh traz TOO zuh mab em TAN zine) is a monoclonal antibody combined with chemotherapy. It is used to treat breast cancer. This medicine may be used for other purposes; ask your health care provider or pharmacist if you have questions. COMMON BRAND NAME(S): Kadcyla What should I tell my health care provider before I take this medicine? They need to know if you have any of these conditions:  heart disease  heart failure  infection (especially a virus infection such as chickenpox, cold sores, or herpes)  liver disease  lung or breathing disease, like asthma  tingling of the fingers or toes, or other nerve disorder  an unusual or allergic reaction to ado-trastuzumab emtansine, other medications, foods, dyes, or  preservatives  pregnant or trying to get pregnant  breast-feeding How should I use this medicine? This medicine is for infusion into a vein. It is given by a health care professional in a hospital or clinic setting. Talk to your pediatrician regarding the use of this medicine in children. Special care may be needed. Overdosage: If you think you have taken too much of this medicine contact a poison control center or emergency room at once. NOTE: This medicine is only for you. Do not share this medicine with others. What if I miss a dose? It is important not to miss your dose. Call your doctor or health care professional if you are unable to keep an appointment. What may interact with this medicine? This medicine may also interact with the following medications:  atazanavir  boceprevir  clarithromycin  delavirdine  indinavir  dalfopristin; quinupristin  isoniazid, INH  itraconazole  ketoconazole  nefazodone  nelfinavir  ritonavir  telaprevir  telithromycin  tipranavir  voriconazole This list may not describe all possible interactions. Give your health care provider a list of all the medicines, herbs, non-prescription drugs, or dietary supplements you use. Also tell them if you smoke, drink alcohol, or use illegal drugs. Some items may interact with your medicine. What should I watch for while using this medicine? Visit your doctor for checks on your progress. This drug may make you feel generally unwell. This is not uncommon, as chemotherapy can affect healthy cells as well as cancer cells. Report any side effects. Continue your course of treatment even though you feel ill unless your doctor tells you to stop. You may need blood work done while you are taking  this medicine. Call your doctor or health care professional for advice if you get a fever, chills or sore throat, or other symptoms of a cold or flu. Do not treat yourself. This drug decreases your body's ability  to fight infections. Try to avoid being around people who are sick. Be careful brushing and flossing your teeth or using a toothpick because you may get an infection or bleed more easily. If you have any dental work done, tell your dentist you are receiving this medicine. Avoid taking products that contain aspirin, acetaminophen, ibuprofen, naproxen, or ketoprofen unless instructed by your doctor. These medicines may hide a fever. Do not become pregnant while taking this medicine or for 7 months after stopping it, men with female partners should use contraception during treatment and for 4 months after the last dose. Women should inform their doctor if they wish to become pregnant or think they might be pregnant. There is a potential for serious side effects to an unborn child. Do not breast-feed an infant while taking this medicine or for 7 months after the last dose. Men who have a partner who is pregnant or who is capable of becoming pregnant should use a condom during sexual activity while taking this medicine and for 4 months after stopping it. Men should inform their doctors if they wish to father a child. This medicine may lower sperm counts. Talk to your health care professional or pharmacist for more information. What side effects may I notice from receiving this medicine? Side effects that you should report to your doctor or health care professional as soon as possible:  allergic reactions like skin rash, itching or hives, swelling of the face, lips, or tongue  breathing problems  chest pain or palpitations  fever or chills, sore throat  general ill feeling or flu-like symptoms  light-colored stools  nausea, vomiting  pain, tingling, numbness in the hands or feet  signs and symptoms of bleeding such as bloody or black, tarry stools; red or dark-brown urine; spitting up blood or brown material that looks like coffee grounds; red spots on the skin; unusual bruising or bleeding from  the eye, gums, or nose  swelling of the legs or ankles  yellowing of the eyes or skin Side effects that usually do not require medical attention (report to your doctor or health care professional if they continue or are bothersome):  changes in taste  constipation  dizziness  headache  joint pain  muscle pain  trouble sleeping  unusually weak or tired This list may not describe all possible side effects. Call your doctor for medical advice about side effects. You may report side effects to FDA at 1-800-FDA-1088. Where should I keep my medicine? This drug is given in a hospital or clinic and will not be stored at home. NOTE: This sheet is a summary. It may not cover all possible information. If you have questions about this medicine, talk to your doctor, pharmacist, or health care provider.  2020 Elsevier/Gold Standard (2017-08-30 10:03:15)  Coronavirus (COVID-19) Are you at risk?  Are you at risk for the Coronavirus (COVID-19)?  To be considered HIGH RISK for Coronavirus (COVID-19), you have to meet the following criteria:  . Traveled to [place], [place], [place], [place] or [place]; or in the [place] to [place], [place], [place], or [place]; and have fever, cough, and shortness of breath within the last 2 weeks of travel OR . Been in close contact with a person diagnosed with  COVID-19 within the last 2 weeks and have fever, cough, and shortness of breath . IF YOU DO NOT MEET THESE CRITERIA, YOU ARE CONSIDERED LOW RISK FOR COVID-19.  What to do if you are HIGH RISK for COVID-19?  Marland Kitchen If you are having a medical emergency, call 911. . Seek medical care right away. Before you go to a doctor's office, urgent care or emergency department, call ahead and tell them about your recent travel, contact with someone diagnosed with COVID-19, and your symptoms. You should receive instructions from your physician's office regarding next steps of care.  . When you arrive at  healthcare provider, tell the healthcare staff immediately you have returned from visiting [place], [place], [place], [place] or [place]; or traveled in the [place] to [place], [place], [place], or [place]; in the last two weeks or you have been in close contact with a person diagnosed with COVID-19 in the last 2 weeks.   . Tell the health care staff about your symptoms: fever, cough and shortness of breath. . After you have been seen by a medical provider, you will be either: o Tested for (COVID-19) and discharged home on quarantine except to seek medical care if symptoms worsen, and asked to  - Stay home and avoid contact with others until you get your results (4-5 days)  - Avoid travel on public transportation if possible (such as bus, train, or airplane) or o Sent to the Emergency Department by EMS for evaluation, COVID-19 testing, and possible admission depending on your condition and test results.  What to do if you are LOW RISK for COVID-19?  Reduce your risk of any infection by using the same precautions used for avoiding the common cold or flu:  Marland Kitchen Wash your hands often with soap and warm water for at least 20 seconds.  If soap and water are not readily available, use an alcohol-based hand sanitizer with at least 60% alcohol.  . If coughing or sneezing, cover your mouth and nose by coughing or sneezing into the elbow areas of your shirt or coat, into a tissue or into your sleeve (not your hands). . Avoid shaking hands with others and consider head nods or verbal greetings only. . Avoid touching your eyes, nose, or mouth with unwashed hands.  . Avoid close contact with people who are sick. . Avoid places or events with large numbers of people in one location, like concerts or sporting events. . Carefully consider travel plans you have or are making. . If you are planning any travel outside or inside the [place], visit the CDC's Travelers' Health webpage for the latest health  notices. . If you have some symptoms but not all symptoms, continue to monitor at home and seek medical attention if your symptoms worsen. . If you are having a medical emergency, call 911.   [place] / e-Visit: [email or web address]         [facility]: [facility]: [phone]                   [facility]: [phone]

## 2018-11-25 ENCOUNTER — Telehealth: Payer: Self-pay | Admitting: Hematology

## 2018-11-25 NOTE — Telephone Encounter (Signed)
Scheduled appt per 8/10 los.  Will call patient tomorrow. (8/12)

## 2018-11-26 ENCOUNTER — Telehealth: Payer: Self-pay | Admitting: Hematology

## 2018-11-26 NOTE — Telephone Encounter (Signed)
Called patient to inform her of her appt date and time.  She is aware of her appt date and time.

## 2018-11-28 ENCOUNTER — Telehealth: Payer: Self-pay | Admitting: *Deleted

## 2018-11-28 NOTE — Telephone Encounter (Signed)
Kim [name] returned call for chemotherapy F/U.  Patient is doing well.  Denies n/v.  Denies any new side effects or symptoms.  Bowel and bladder functioning well.  Eating well.  "Drinking but never quite enough; have a glass water now."  Instructed to drink 64 oz minimum daily or at least the day before, of and after treatment.   Denies questions or needs at this time.  Encouraged to call [phone] Mon -Fri 8:00 am - 4:30 pm or anytime as needed for symptoms, changes or event outside office hours.

## 2018-11-28 NOTE — Telephone Encounter (Signed)
error 

## 2018-11-28 NOTE — Telephone Encounter (Signed)
Called Kim [name] at [phone] number.  Message left requesting a return call for chemotherapy follow up.  Awaiting return call from patient.

## 2018-11-28 NOTE — Telephone Encounter (Signed)
-----   Message from Zola Button, RN sent at 11/24/2018  4:48 PM EDT ----- Regarding: Dr. Burr Medico; First time F/U Call Pt received first time Kadcyla on 11/24/18. Tolerated well. Thank you!

## 2018-12-10 ENCOUNTER — Ambulatory Visit (HOSPITAL_COMMUNITY)
Admission: RE | Admit: 2018-12-10 | Discharge: 2018-12-10 | Disposition: A | Discharge status: Home / Self Care | Payer: BC Managed Care – PPO | Source: Ambulatory Visit | Attending: Internal Medicine | Admitting: Internal Medicine

## 2018-12-10 ENCOUNTER — Other Ambulatory Visit: Payer: Self-pay

## 2018-12-10 ENCOUNTER — Encounter (HOSPITAL_COMMUNITY): Payer: BC Managed Care – PPO | Admitting: Internal Medicine

## 2018-12-10 DIAGNOSIS — Z170 Estrogen receptor positive status [ER+]: Secondary | ICD-10-CM | POA: Diagnosis not present

## 2018-12-10 DIAGNOSIS — C50311 Malignant neoplasm of lower-inner quadrant of right female breast: Secondary | ICD-10-CM | POA: Insufficient documentation

## 2018-12-10 NOTE — Progress Notes (Signed)
  Echocardiogram 2D Echocardiogram has been performed.  Bobbye Charleston 12/10/2018, 9:57 AM

## 2018-12-12 ENCOUNTER — Other Ambulatory Visit: Payer: Self-pay

## 2018-12-12 ENCOUNTER — Ambulatory Visit (HOSPITAL_COMMUNITY)
Admission: RE | Admit: 2018-12-12 | Discharge: 2018-12-12 | Disposition: A | Discharge status: Home / Self Care | Payer: BC Managed Care – PPO | Source: Ambulatory Visit | Attending: Internal Medicine | Admitting: Internal Medicine

## 2018-12-12 DIAGNOSIS — C50311 Malignant neoplasm of lower-inner quadrant of right female breast: Secondary | ICD-10-CM | POA: Diagnosis not present

## 2018-12-12 DIAGNOSIS — Z170 Estrogen receptor positive status [ER+]: Secondary | ICD-10-CM | POA: Diagnosis not present

## 2018-12-12 NOTE — Progress Notes (Signed)
Cardio-Oncology TeleHealth Note  Due to national recommendations of social distancing due to Barry 19, Audio/video telehealth visit is felt to be most appropriate for this patient at this time.  See MyChart message from today for patient consent regarding telehealth for [facility].  Date:  12/12/2018   ID:  Rylann Munford, DOB 1975/12/26, MRN [id number]  Location: Home  Provider location: [facility] Type of Visit: New patient  PCP:  [name], MD  Cardiologist:  No primary care provider on file. Primary HF: Rufus Beske  Chief Complaint:  Breast cancer   History of Present Illness:  Ms. Kim [name] is a 43 y/o female with h/o GERD, asthma and right breast CA referred by Dr. Annamaria Boots for enrollment in the Cardio-oncology clinic for echo surveillance during chemotherapy.    Cancer Staging Cancer of overlapping sites of right female breast [facility]) Staging form: Breast, AJCC 8th Edition - Clinical stage from 05/28/2018: Stage IB (cT2, cN1, cM0, G3, ER+, PR+, HER2+) - Signed by [name], MD on 05/28/2018  Malignant neoplasm of lower-outer quadrant of right breast of female, estrogen receptor positive ([place]) Staging form: Breast, AJCC 8th Edition - Clinical stage from 05/28/2018: Stage IIB (cT2, cN1, cM0, G3, ER+, PR+, HER2-) - Unsigned       Cancer of overlapping sites of right female breast ([place])   05/14/2018 Mammogram    Right breast mammogram 05/14/18  IMPRESSION  There is a 1.0 x 0.5 x 1.2 cm lobular hypoechoic mass right breast 7 o'clock position 3 cm from nipple, a 0.8 x 0.6 x 1.2 cm lobular hypoechoic mass right breast 7 o'clock position 2 cm from the nipple, and a 0.9 x 0.5 x 0.8 cm lobular hypoechoic mass right breast 5 o'clock position 3 cm from nipple.  There is a 3.8 x 1.0 x 1.3 cm lobular hypoechoic mass right breast 5 o'clock position 2 cm from nipple.  Multiple thickened right axillary lymph nodes (approximately 15). Measuring  up to 1.0 cm in thickness. Adenopathy corresponds with axillary palpable abnormality.  Indeterminate round and punctate calcifications upper-outer quadrant right breast.Indeterminate round and punctate calcifications upper-outer quadrant right breast.    05/21/2018 Initial Biopsy    Diagnosis 05/21/18  1. Breast, right, needle core biopsy, axilla, 5 o'clock, ribbon clip - INVASIVE MAMMARY CARCINOMA. - MAMMARY CARCINOMA IN SITU. 2. Breast, right, needle core biopsy, 7 o'clock, coil clip - INVASIVE MAMMARY CARCINOMA. - MAMMARY CARCINOMA IN SITU. 3. Lymph node, needle/core biopsy, right axillary LN, hydromark - METASTATIC MAMMARY CARCINOMA.     05/21/2018 Receptors her2    1. PROGNOSTIC INDICATORS Results: The tumor cells are EQUIVOCAL for Her2 (2+);  HER2 **NEGATIVE** by FISH   Estrogen Receptor: 80%, POSITIVE, MODERATE STAINING INTENSITY Progesterone Receptor: 5%, POSITIVE, MODERATE-WEAK STAINING INTENSITY Proliferation Marker Ki67: 10%  3. PROGNOSTIC INDICATORS Results: The tumor cells are POSITIVE for Her2 (3+). Estrogen Receptor: 90%, POSITIVE, STRONG STAINING INTENSITY Progesterone Receptor: 50%, POSITIVE, MODERATE STAINING INTENSITY Proliferation Marker Ki67: 5%    05/26/2018 Initial Diagnosis    Malignant neoplasm of lower-inner quadrant of right breast of female, estrogen receptor positive ([place])    05/27/2018 Mammogram    Left breast mammogram 05/27/18  IMPRESSION: No mammographic evidence of LEFT breast malignancy.    05/28/2018 Cancer Staging    Staging form: Breast, AJCC 8th Edition - Clinical stage from 05/28/2018: Stage IB (cT2, cN1, cM0, G3, ER+, PR+, HER2+) - Signed by [name], MD on 05/28/2018    06/03/2018 Imaging  MR BREAST BILATERAL W WO CONTRAST INC CAD  IMPRESSION: 1. Large enhancing masses and non mass enhancement identified throughout the majority of the right breast. Additionally, there are smaller masses identified within  the upper inner and lower inner right breast. Overall findings are concerning for extensive malignancy throughout all 4 quadrants of the right breast. 2. Extensive bulky lymphadenopathy identified within the right axilla. The matted appearance makes counting the number of abnormal lymph nodes difficult as they are immediately approximated to each other. 3. At least one abnormal appearing right internal mammary lymph node. 4. Nonenhancing lesion within the sternum with associated sternal marrow heterogeneity, raising the possibility of osseous metastatic disease within the sternum. 5. Two enhancing masses within the left breast as described above.      06/05/2018 PET scan    NM PET Image Initial (PI) Skull Base To Thigh  IMPRESSION: 1. Multiple borderline enlarged right axillary lymph nodes identified exhibiting mild to moderate increased uptake. Can not rule out nodal metastasis. Correlation with biopsy recommended. 2. Asymmetric increased uptake within the right breast corresponding to MRI abnormality. 3. Subcentimeter right retropectoral and right internal mammary lymph nodes are again identified and exhibit nonspecific, low level FDG uptake. 4. Noncalcified solid nodule within the lateral right lower lobe measures 9 mm without significant radiotracer uptake. 5. No hypermetabolic osseous lesions identified.      06/09/2018 -  Chemotherapy    Neoadjuvant chemo TCHP every 3 weeks     06/10/2018 Pathology Results    Surgical pathology  Diagnosis 1. Breast, left, needle core biopsy, LOQ, barbell - MILD FIBROCYSTIC CHANGES. 2. Breast, left, needle core biopsy, UIQ cylinder - MILD FIBROCYSTIC CHANGES.     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 There were no deleterious mutationsinAIP,  ALK, APC, ATM, AXIN2,BAP1, BARD1, BLM, BMPR1A, BRCA1, BRCA2, BRIP1, CASR, CDC73, CDH1, CDK4, CDKN1B, CDKN1C, CDKN2A (p14ARF), CDKN2A (p16INK4a), CEBPA, CHEK2, CTNNA1, DICER1, DIS3L2, EGFR (c.2369C>T, p.Thr790Met variant only), EPCAM (Deletion/duplication testing only), FH, FLCN, GATA2, GPC3, GREM1 (Promoter region deletion/duplication testing only), HOXB13 (c.251G>A, p.Gly84Glu), HRAS, KIT, MAX, MEN1, MET, MITF (c.952G>A, p.Glu318Lys variant only), MLH1, MSH2, MSH3, MSH6, MUTYH, NBN, NF1, NF2, NTHL1, PDGFRA, PHOX2B, PMS2, POLD1, POLE, POT1, PRKAR1A, PTCH1, PTEN, RAD50, RAD51C, RAD51D, RB1, RECQL4, RET, RUNX1, SDHAF2, SDHA (sequence changes only), SDHB, SDHC, SDHD, SMAD4, SMARCA4, SMARCB1, SMARCE1, STK11, SUFU, TERC, TERT, TMEM127, TP53, TSC1, TSC2, VHL, WRN and WT1. .     She presents via Engineer, civil (consulting) for a telehealth visit today. She is a Chartered certified accountant at [employer]. No h/o any cardiology issues. Has always been active. She started [facility] in 2/20. Had surgery on July 7. Now on Kadcyla. Starts XRT in 2 weeks. Running in the am.   Echo 12/10/18 EF 60-65% GLS -22.2% Personally reviewed Echo 05/30/18 EF 55-60% GLS - 27.1% Echo 08/28/18 EF 55-60% GLS -21.8%  Harlon Ditty denies symptoms worrisome for COVID 19.   Past Medical History:  Diagnosis Date  . Acid reflux   . Acute bronchitis   . Anemia   . Asthma   . Family history of brain cancer   . Family history of breast cancer   . Family history of prostate cancer   . Migraine    Past Surgical History:  Procedure Laterality Date  . BREAST BIOPSY Right 05/23/2018   x3, malignant  . DILATION AND CURETTAGE OF UTERUS     2006 x1, 2009  x2  . LAPAROTOMY  2008  . PORTACATH PLACEMENT Left 06/06/2018   Procedure: INSERTION PORT-A-CATH POSSIBLE ULTRASOUND;  Surgeon: [name], MD;  Location: [facility];  Service: General;  Laterality: Left;     Current Outpatient Medications  Medication Sig Dispense Refill  .  albuterol (PROVENTIL HFA;VENTOLIN HFA) 108 (90 BASE) MCG/ACT inhaler Inhale 2 puffs into the lungs every 6 (six) hours as needed for wheezing or shortness of breath.    . ALPRAZolam (XANAX) 0.5 MG tablet Take 0.25-0.5 mg by mouth at bedtime as needed for anxiety.     Haze Justin Probiotic (BIOGAIA PROBIOTIC) LIQD Take by mouth daily at 8 pm.    . cetirizine (ZYRTEC) 10 MG chewable tablet Chew 10 mg by mouth daily.    . diphenhydrAMINE (BENADRYL) 25 mg capsule Take 25 mg by mouth at bedtime as needed for sleep.    Marland Kitchen ibuprofen (ADVIL) 200 MG tablet Take 400 mg by mouth every 6 (six) hours as needed for headache or moderate pain.    . Melatonin 3 MG CAPS Take 3 mg by mouth at bedtime.    . propranolol (INDERAL) 10 MG tablet Take 5 mg by mouth daily as needed (for shaking hands).      No current facility-administered medications for this encounter.     Allergies:   Patient has no known allergies.   Social History:  The patient  reports that she has never smoked. She has never used smokeless tobacco. She reports previous alcohol use. She reports that she does not use drugs.   Family History:  The patient's family history includes Breast cancer in an other family member; Breast cancer (age of onset: 29) in her maternal great-grandmother; Cancer in her maternal aunt; Cancer (age of onset: 16) in her sister; Cancer - Other in an other family member; Hyperlipidemia in her father; Prostate cancer (age of onset: 25) in her maternal uncle; Prostate cancer (age of onset: 27) in her maternal grandfather; Skin cancer in her maternal grandmother.   ROS:  Please see the history of present illness.   All other systems are personally reviewed and negative.   Exam:  (Video/Tele Health Call; Exam is subjective and or/visual.) General:  Speaks in full sentences. No resp difficulty. Lungs: Normal respiratory effort with conversation.  Abdomen: Non-distended per patient report Extremities: Pt denies edema. Neuro:  Alert & oriented x 3.   Recent Labs: 11/24/2018: ALT 15; BUN 13; Creatinine 0.81; Hemoglobin 10.1; Platelet Count 155; Potassium 4.0; Sodium 142  Personally reviewed   Wt Readings from Last 3 Encounters:  11/24/18 62.1 kg (136 lb 12.8 oz)  11/21/18 63.5 kg (140 lb)  11/03/18 62.7 kg (138 lb 4.8 oz)      ASSESSMENT AND PLAN:  1.  Breast CA - - I reviewed echos personally. EF and Doppler parameters stable. No HF on exam. Continue Kadcyla   COVID screen The patient does not have any symptoms that suggest any further testing/ screening at this time.  Social distancing reinforced today.  Recommended follow-up:  As above  Relevant cardiac medications were reviewed at length with the patient today.   The patient does not have concerns regarding their medications at this time.   The following changes were made today:  As above  Today, I have spent 14 minutes with the patient with telehealth technology discussing the above issues .    Signed, Glori Bickers, MD  12/12/2018 2:23 PM  [facility] [address]  [address] Heart and [place] [address] [phone] (office) [phone] (fax)

## 2018-12-15 ENCOUNTER — Encounter: Payer: Self-pay | Admitting: Hematology

## 2018-12-15 ENCOUNTER — Other Ambulatory Visit: Payer: Self-pay

## 2018-12-15 ENCOUNTER — Encounter (HOSPITAL_COMMUNITY): Payer: Self-pay | Admitting: *Deleted

## 2018-12-15 ENCOUNTER — Inpatient Hospital Stay: Payer: BC Managed Care – PPO

## 2018-12-15 ENCOUNTER — Telehealth: Payer: Self-pay | Admitting: Hematology

## 2018-12-15 VITALS — BP 99/70 | HR 45 | Temp 98.3°F | Resp 16

## 2018-12-15 DIAGNOSIS — Z170 Estrogen receptor positive status [ER+]: Secondary | ICD-10-CM

## 2018-12-15 DIAGNOSIS — Z95828 Presence of other vascular implants and grafts: Secondary | ICD-10-CM

## 2018-12-15 DIAGNOSIS — C50811 Malignant neoplasm of overlapping sites of right female breast: Secondary | ICD-10-CM

## 2018-12-15 DIAGNOSIS — Z5112 Encounter for antineoplastic immunotherapy: Secondary | ICD-10-CM | POA: Diagnosis not present

## 2018-12-15 LAB — CMP (CANCER CENTER ONLY)
ALT: 24 U/L (ref 0–44)
AST: 13 U/L — ABNORMAL LOW (ref 15–41)
Albumin: 4.3 g/dL (ref 3.5–5.0)
Alkaline Phosphatase: 68 U/L (ref 38–126)
Anion gap: 8 (ref 5–15)
BUN: 16 mg/dL (ref 6–20)
CO2: 29 mmol/L (ref 22–32)
Calcium: 9.5 mg/dL (ref 8.9–10.3)
Chloride: 105 mmol/L (ref 98–111)
Creatinine: 0.78 mg/dL (ref 0.44–1.00)
GFR, Est AFR Am: >60 mL/min (ref >60)
GFR, Estimated: >60 mL/min (ref >60)
Glucose, Bld: 73 mg/dL (ref 70–99)
Potassium: 4.1 mmol/L (ref 3.5–5.1)
Sodium: 142 mmol/L (ref 135–145)
Total Bilirubin: 0.4 mg/dL (ref 0.3–1.2)
Total Protein: 7.1 g/dL (ref 6.5–8.1)

## 2018-12-15 LAB — CBC WITH DIFFERENTIAL (CANCER CENTER ONLY)
Abs Immature Granulocytes: 0 10*3/uL (ref 0.00–0.07)
Basophils Absolute: 0 10*3/uL (ref 0.0–0.1)
Basophils Relative: 1 %
Eosinophils Absolute: 0.1 10*3/uL (ref 0.0–0.5)
Eosinophils Relative: 5 %
HCT: 34.2 % — ABNORMAL LOW (ref 36.0–46.0)
Hemoglobin: 11 g/dL — ABNORMAL LOW (ref 12.0–15.0)
Immature Granulocytes: 0 %
Lymphocytes Relative: 52 %
Lymphs Abs: 1.2 10*3/uL (ref 0.7–4.0)
MCH: 31.2 pg (ref 26.0–34.0)
MCHC: 32.2 g/dL (ref 30.0–36.0)
MCV: 96.9 fL (ref 80.0–100.0)
Monocytes Absolute: 0.3 10*3/uL (ref 0.1–1.0)
Monocytes Relative: 11 %
Neutro Abs: 0.7 10*3/uL — ABNORMAL LOW (ref 1.7–7.7)
Neutrophils Relative %: 31 %
Platelet Count: 145 10*3/uL — ABNORMAL LOW (ref 150–400)
RBC: 3.53 MIL/uL — ABNORMAL LOW (ref 3.87–5.11)
RDW: 12.4 % (ref 11.5–15.5)
WBC Count: 2.3 10*3/uL — ABNORMAL LOW (ref 4.0–10.5)
nRBC: 0 % (ref 0.0–0.2)

## 2018-12-15 MED ORDER — DIPHENHYDRAMINE HCL 25 MG PO CAPS
50.0000 mg | ORAL_CAPSULE | Freq: Once | ORAL | Status: AC
  Administered 2018-12-15: 12:00:00 50 mg via ORAL

## 2018-12-15 MED ORDER — SODIUM CHLORIDE 0.9 % IV SOLN
3.6000 mg/kg | Freq: Once | INTRAVENOUS | Status: AC
  Administered 2018-12-15: 13:00:00 220 mg via INTRAVENOUS
  Filled 2018-12-15: qty 8

## 2018-12-15 MED ORDER — SODIUM CHLORIDE 0.9% FLUSH
10.0000 mL | INTRAVENOUS | Status: DC | PRN
  Administered 2018-12-15: 13:00:00 10 mL
  Filled 2018-12-15: qty 10

## 2018-12-15 MED ORDER — HEPARIN SOD (PORK) LOCK FLUSH 100 UNIT/ML IV SOLN
500.0000 [IU] | Freq: Once | INTRAVENOUS | Status: AC | PRN
  Administered 2018-12-15: 13:00:00 500 [IU]
  Filled 2018-12-15: qty 5

## 2018-12-15 MED ORDER — GOSERELIN ACETATE 3.6 MG ~~LOC~~ IMPL
DRUG_IMPLANT | SUBCUTANEOUS | Status: AC
  Filled 2018-12-15: qty 3.6

## 2018-12-15 MED ORDER — ACETAMINOPHEN 325 MG PO TABS
ORAL_TABLET | ORAL | Status: AC
  Filled 2018-12-15: qty 2

## 2018-12-15 MED ORDER — DIPHENHYDRAMINE HCL 25 MG PO CAPS
ORAL_CAPSULE | ORAL | Status: AC
  Filled 2018-12-15: qty 2

## 2018-12-15 MED ORDER — GOSERELIN ACETATE 3.6 MG ~~LOC~~ IMPL: 3.6000 mg | DRUG_IMPLANT | Freq: Once | SUBCUTANEOUS | Status: DC

## 2018-12-15 MED ORDER — SODIUM CHLORIDE 0.9 % IV SOLN
Freq: Once | INTRAVENOUS | Status: AC
  Administered 2018-12-15: 12:00:00 via INTRAVENOUS
  Filled 2018-12-15: qty 250

## 2018-12-15 MED ORDER — ACETAMINOPHEN 325 MG PO TABS
650.0000 mg | ORAL_TABLET | Freq: Once | ORAL | Status: AC
  Administered 2018-12-15: 12:00:00 650 mg via ORAL

## 2018-12-15 NOTE — Patient Instructions (Signed)
Your physician has requested that you have an echocardiogram. Echocardiography is a painless test that uses sound waves to create images of your heart. It provides your doctor with information about the size and shape of your heart and how well your heart's chambers and valves are working. This procedure takes approximately one hour. There are no restrictions for this procedure.  IN 3 MONTHS  Your physician recommends that you schedule a follow-up appointment in: 3 months  Our office will call you to schedule both of these appointments

## 2018-12-15 NOTE — Progress Notes (Signed)
No PA obtained yet for Zoladex.  Hold dose until PA approved.  MD aware.  Kennith Center, Pharm.D., CPP 12/15/2018@12 :12 PM

## 2018-12-15 NOTE — Telephone Encounter (Signed)
I saw pt in infusion room today, due to her neutropenia, ANC 0.7K.  She has had mild neutropenia since her surgery, ANC 1.0 before her first Kadcyla 3 weeks ago.  She feels well, denies any fever chills or recent infection.  I discussed pharmacist, no dose adjustment is recommended by manufacturer, due to her high risk disease, will proceed Kadcyla at a full dose today.  I recommend Udenyca injection within the next week when it is approved by her insurance.  Pt is going to start radiation on 9/9, her radiation oncologist recommends holding aromatase inhibitor until she completes radiation.  Plan to start Zoladex injection when it is approved next week.  I will see her in 3 weeks.   [name]  12/15/2018

## 2018-12-15 NOTE — Patient Instructions (Signed)
[facility] Discharge Instructions for Patients Receiving Chemotherapy  Today you received the following chemotherapy agents: Ado-Trastuzumab Emtansine (Kadcyla)  To help prevent nausea and vomiting after your treatment, we encourage you to take your nausea medication as directed.   If you develop nausea and vomiting that is not controlled by your nausea medication, call the clinic.   BELOW ARE SYMPTOMS THAT SHOULD BE REPORTED IMMEDIATELY:  *FEVER GREATER THAN 100.5 F  *CHILLS WITH OR WITHOUT FEVER  NAUSEA AND VOMITING THAT IS NOT CONTROLLED WITH YOUR NAUSEA MEDICATION  *UNUSUAL SHORTNESS OF BREATH  *UNUSUAL BRUISING OR BLEEDING  TENDERNESS IN MOUTH AND THROAT WITH OR WITHOUT PRESENCE OF ULCERS  *URINARY PROBLEMS  *BOWEL PROBLEMS  UNUSUAL RASH Items with * indicate a potential emergency and should be followed up as soon as possible.  Feel free to call the clinic should you have any questions or concerns. The clinic phone number is [phone].  Please show the CHEMO ALERT CARD at check-in to the Emergency Department and triage nurse.  Ado-Trastuzumab Emtansine for injection What is this medicine? ADO-TRASTUZUMAB EMTANSINE (ADD oh traz TOO zuh mab em TAN zine) is a monoclonal antibody combined with chemotherapy. It is used to treat breast cancer. This medicine may be used for other purposes; ask your health care provider or pharmacist if you have questions. COMMON BRAND NAME(S): Kadcyla What should I tell my health care provider before I take this medicine? They need to know if you have any of these conditions:  heart disease  heart failure  infection (especially a virus infection such as chickenpox, cold sores, or herpes)  liver disease  lung or breathing disease, like asthma  tingling of the fingers or toes, or other nerve disorder  an unusual or allergic reaction to ado-trastuzumab emtansine, other medications, foods, dyes, or  preservatives  pregnant or trying to get pregnant  breast-feeding How should I use this medicine? This medicine is for infusion into a vein. It is given by a health care professional in a hospital or clinic setting. Talk to your pediatrician regarding the use of this medicine in children. Special care may be needed. Overdosage: If you think you have taken too much of this medicine contact a poison control center or emergency room at once. NOTE: This medicine is only for you. Do not share this medicine with others. What if I miss a dose? It is important not to miss your dose. Call your doctor or health care professional if you are unable to keep an appointment. What may interact with this medicine? This medicine may also interact with the following medications:  atazanavir  boceprevir  clarithromycin  delavirdine  indinavir  dalfopristin; quinupristin  isoniazid, INH  itraconazole  ketoconazole  nefazodone  nelfinavir  ritonavir  telaprevir  telithromycin  tipranavir  voriconazole This list may not describe all possible interactions. Give your health care provider a list of all the medicines, herbs, non-prescription drugs, or dietary supplements you use. Also tell them if you smoke, drink alcohol, or use illegal drugs. Some items may interact with your medicine. What should I watch for while using this medicine? Visit your doctor for checks on your progress. This drug may make you feel generally unwell. This is not uncommon, as chemotherapy can affect healthy cells as well as cancer cells. Report any side effects. Continue your course of treatment even though you feel ill unless your doctor tells you to stop. You may need blood work done while you are taking   this medicine. Call your doctor or health care professional for advice if you get a fever, chills or sore throat, or other symptoms of a cold or flu. Do not treat yourself. This drug decreases your body's ability  to fight infections. Try to avoid being around people who are sick. Be careful brushing and flossing your teeth or using a toothpick because you may get an infection or bleed more easily. If you have any dental work done, tell your dentist you are receiving this medicine. Avoid taking products that contain aspirin, acetaminophen, ibuprofen, naproxen, or ketoprofen unless instructed by your doctor. These medicines may hide a fever. Do not become pregnant while taking this medicine or for 7 months after stopping it, men with female partners should use contraception during treatment and for 4 months after the last dose. Women should inform their doctor if they wish to become pregnant or think they might be pregnant. There is a potential for serious side effects to an unborn child. Do not breast-feed an infant while taking this medicine or for 7 months after the last dose. Men who have a partner who is pregnant or who is capable of becoming pregnant should use a condom during sexual activity while taking this medicine and for 4 months after stopping it. Men should inform their doctors if they wish to father a child. This medicine may lower sperm counts. Talk to your health care professional or pharmacist for more information. What side effects may I notice from receiving this medicine? Side effects that you should report to your doctor or health care professional as soon as possible:  allergic reactions like skin rash, itching or hives, swelling of the face, lips, or tongue  breathing problems  chest pain or palpitations  fever or chills, sore throat  general ill feeling or flu-like symptoms  light-colored stools  nausea, vomiting  pain, tingling, numbness in the hands or feet  signs and symptoms of bleeding such as bloody or black, tarry stools; red or dark-brown urine; spitting up blood or brown material that looks like coffee grounds; red spots on the skin; unusual bruising or bleeding from  the eye, gums, or nose  swelling of the legs or ankles  yellowing of the eyes or skin Side effects that usually do not require medical attention (report to your doctor or health care professional if they continue or are bothersome):  changes in taste  constipation  dizziness  headache  joint pain  muscle pain  trouble sleeping  unusually weak or tired This list may not describe all possible side effects. Call your doctor for medical advice about side effects. You may report side effects to FDA at 1-800-FDA-1088. Where should I keep my medicine? This drug is given in a hospital or clinic and will not be stored at home. NOTE: This sheet is a summary. It may not cover all possible information. If you have questions about this medicine, talk to your doctor, pharmacist, or health care provider.  2020 Elsevier/Gold Standard (2017-08-30 10:03:15)  Coronavirus (COVID-19) Are you at risk?  Are you at risk for the Coronavirus (COVID-19)?  To be considered HIGH RISK for Coronavirus (COVID-19), you have to meet the following criteria:  . Traveled to [place], [place], [place], [place] or [place]; or in the [place] to [place], [place], [place], or [place]; and have fever, cough, and shortness of breath within the last 2 weeks of travel OR . Been in close contact with a person diagnosed with   COVID-19 within the last 2 weeks and have fever, cough, and shortness of breath . IF YOU DO NOT MEET THESE CRITERIA, YOU ARE CONSIDERED LOW RISK FOR COVID-19.  What to do if you are HIGH RISK for COVID-19?  . If you are having a medical emergency, call 911. . Seek medical care right away. Before you go to a doctor's office, urgent care or emergency department, call ahead and tell them about your recent travel, contact with someone diagnosed with COVID-19, and your symptoms. You should receive instructions from your physician's office regarding next steps of care.  . When you arrive at  healthcare provider, tell the healthcare staff immediately you have returned from visiting [place], [place], [place], [place] or [place]; or traveled in the [place] to [place], [place], [place], or [place]; in the last two weeks or you have been in close contact with a person diagnosed with COVID-19 in the last 2 weeks.   . Tell the health care staff about your symptoms: fever, cough and shortness of breath. . After you have been seen by a medical provider, you will be either: o Tested for (COVID-19) and discharged home on quarantine except to seek medical care if symptoms worsen, and asked to  - Stay home and avoid contact with others until you get your results (4-5 days)  - Avoid travel on public transportation if possible (such as bus, train, or airplane) or o Sent to the Emergency Department by EMS for evaluation, COVID-19 testing, and possible admission depending on your condition and test results.  What to do if you are LOW RISK for COVID-19?  Reduce your risk of any infection by using the same precautions used for avoiding the common cold or flu:  . Wash your hands often with soap and warm water for at least 20 seconds.  If soap and water are not readily available, use an alcohol-based hand sanitizer with at least 60% alcohol.  . If coughing or sneezing, cover your mouth and nose by coughing or sneezing into the elbow areas of your shirt or coat, into a tissue or into your sleeve (not your hands). . Avoid shaking hands with others and consider head nods or verbal greetings only. . Avoid touching your eyes, nose, or mouth with unwashed hands.  . Avoid close contact with people who are sick. . Avoid places or events with large numbers of people in one location, like concerts or sporting events. . Carefully consider travel plans you have or are making. . If you are planning any travel outside or inside the [place], visit the CDC's Travelers' Health webpage for the latest health  notices. . If you have some symptoms but not all symptoms, continue to monitor at home and seek medical attention if your symptoms worsen. . If you are having a medical emergency, call 911.   ADDITIONAL HEALTHCARE OPTIONS FOR PATIENTS  [facility] / e-Visit: [email or web address]         [facility]: [phone]  [facility]: [phone]                   [facility]: [phone]   

## 2018-12-15 NOTE — Progress Notes (Signed)
Orders placed per Dr Haroldine Laws, message sent to schedulers to arrange next appts, AVS sent to pt via mychart

## 2018-12-15 NOTE — Addendum Note (Signed)
Encounter addended by: Scarlette Calico, RN on: 12/15/2018 10:41 AM  Actions taken: Order list changed, Diagnosis association updated, Clinical Note Signed

## 2018-12-15 NOTE — Progress Notes (Signed)
Ok to treat with ANC 0.7 per Dr. Burr Medico.

## 2018-12-19 ENCOUNTER — Other Ambulatory Visit: Payer: Self-pay

## 2018-12-19 ENCOUNTER — Inpatient Hospital Stay: Payer: BC Managed Care – PPO | Attending: Hematology

## 2018-12-19 VITALS — BP 103/66 | HR 61 | Temp 98.7°F | Resp 18

## 2018-12-19 DIAGNOSIS — Z23 Encounter for immunization: Secondary | ICD-10-CM | POA: Insufficient documentation

## 2018-12-19 DIAGNOSIS — Z95828 Presence of other vascular implants and grafts: Secondary | ICD-10-CM

## 2018-12-19 DIAGNOSIS — J45909 Unspecified asthma, uncomplicated: Secondary | ICD-10-CM | POA: Insufficient documentation

## 2018-12-19 DIAGNOSIS — D6481 Anemia due to antineoplastic chemotherapy: Secondary | ICD-10-CM | POA: Insufficient documentation

## 2018-12-19 DIAGNOSIS — R911 Solitary pulmonary nodule: Secondary | ICD-10-CM | POA: Diagnosis not present

## 2018-12-19 DIAGNOSIS — Z5112 Encounter for antineoplastic immunotherapy: Secondary | ICD-10-CM | POA: Insufficient documentation

## 2018-12-19 DIAGNOSIS — D709 Neutropenia, unspecified: Secondary | ICD-10-CM | POA: Diagnosis not present

## 2018-12-19 DIAGNOSIS — Z170 Estrogen receptor positive status [ER+]: Secondary | ICD-10-CM | POA: Diagnosis not present

## 2018-12-19 DIAGNOSIS — Z5111 Encounter for antineoplastic chemotherapy: Secondary | ICD-10-CM | POA: Diagnosis present

## 2018-12-19 DIAGNOSIS — Z5189 Encounter for other specified aftercare: Secondary | ICD-10-CM | POA: Insufficient documentation

## 2018-12-19 DIAGNOSIS — C50811 Malignant neoplasm of overlapping sites of right female breast: Secondary | ICD-10-CM | POA: Diagnosis present

## 2018-12-19 MED ORDER — PEGFILGRASTIM-CBQV 6 MG/0.6ML ~~LOC~~ SOSY
6.0000 mg | PREFILLED_SYRINGE | Freq: Once | SUBCUTANEOUS | Status: AC
  Administered 2018-12-19: 6 mg via SUBCUTANEOUS

## 2018-12-19 MED ORDER — PEGFILGRASTIM-CBQV 6 MG/0.6ML ~~LOC~~ SOSY
PREFILLED_SYRINGE | SUBCUTANEOUS | Status: AC
  Filled 2018-12-19: qty 0.6

## 2018-12-19 MED ORDER — GOSERELIN ACETATE 3.6 MG ~~LOC~~ IMPL
DRUG_IMPLANT | SUBCUTANEOUS | Status: AC
  Filled 2018-12-19: qty 3.6

## 2018-12-19 MED ORDER — GOSERELIN ACETATE 3.6 MG ~~LOC~~ IMPL
3.6000 mg | DRUG_IMPLANT | Freq: Once | SUBCUTANEOUS | Status: AC
  Administered 2018-12-19: 12:00:00 3.6 mg via SUBCUTANEOUS

## 2018-12-19 NOTE — Patient Instructions (Addendum)
Goserelin injection What is this medicine? GOSERELIN (GOE se rel in) is similar to a hormone found in the body. It lowers the amount of sex hormones that the body makes. Men will have lower testosterone levels and women will have lower estrogen levels while taking this medicine. In men, this medicine is used to treat prostate cancer; the injection is either given once per month or once every 12 weeks. A once per month injection (only) is used to treat women with endometriosis, dysfunctional uterine bleeding, or advanced breast cancer. This medicine may be used for other purposes; ask your health care provider or pharmacist if you have questions. COMMON BRAND NAME(S): Zoladex What should I tell my health care provider before I take this medicine? They need to know if you have any of these conditions:  bone problems  diabetes  heart disease  history of irregular heartbeat  an unusual or allergic reaction to goserelin, other medicines, foods, dyes, or preservatives  pregnant or trying to get pregnant  breast-feeding How should I use this medicine? This medicine is for injection under the skin. It is given by a health care professional in a hospital or clinic setting. Talk to your pediatrician regarding the use of this medicine in children. Special care may be needed. Overdosage: If you think you have taken too much of this medicine contact a poison control center or emergency room at once. NOTE: This medicine is only for you. Do not share this medicine with others. What if I miss a dose? It is important not to miss your dose. Call your doctor or health care professional if you are unable to keep an appointment. What may interact with this medicine? Do not take this medicine with any of the following medications:  cisapride  dronedarone  pimozide  thioridazine This medicine may also interact with the following medications:  other medicines that prolong the QT interval (an abnormal  heart rhythm) This list may not describe all possible interactions. Give your health care provider a list of all the medicines, herbs, non-prescription drugs, or dietary supplements you use. Also tell them if you smoke, drink alcohol, or use illegal drugs. Some items may interact with your medicine. What should I watch for while using this medicine? Visit your doctor or health care provider for regular checks on your progress. Your symptoms may appear to get worse during the first weeks of this therapy. Tell your doctor or healthcare provider if your symptoms do not start to get better or if they get worse after this time. Your bones may get weaker if you take this medicine for a long time. If you smoke or frequently drink alcohol you may increase your risk of bone loss. A family history of osteoporosis, chronic use of drugs for seizures (convulsions), or corticosteroids can also increase your risk of bone loss. Talk to your doctor about how to keep your bones strong. This medicine should stop regular monthly menstruation in women. Tell your doctor if you continue to menstruate. Women should not become pregnant while taking this medicine or for 12 weeks after stopping this medicine. Women should inform their doctor if they wish to become pregnant or think they might be pregnant. There is a potential for serious side effects to an unborn child. Talk to your health care professional or pharmacist for more information. Do not breast-feed an infant while taking this medicine. Men should inform their doctors if they wish to father a child. This medicine may lower sperm counts. Talk   to your health care professional or pharmacist for more information. This medicine may increase blood sugar. Ask your healthcare provider if changes in diet or medicines are needed if you have diabetes. What side effects may I notice from receiving this medicine? Side effects that you should report to your doctor or health care  professional as soon as possible:  allergic reactions like skin rash, itching or hives, swelling of the face, lips, or tongue  bone pain  breathing problems  changes in vision  chest pain  feeling faint or lightheaded, falls  fever, chills  pain, swelling, warmth in the leg  pain, tingling, numbness in the hands or feet  signs and symptoms of high blood sugar such as being more thirsty or hungry or having to urinate more than normal. You may also feel very tired or have blurry vision  signs and symptoms of low blood pressure like dizziness; feeling faint or lightheaded, falls; unusually weak or tired  stomach pain  swelling of the ankles, feet, hands  trouble passing urine or change in the amount of urine  unusually high or low blood pressure  unusually weak or tired Side effects that usually do not require medical attention (report to your doctor or health care professional if they continue or are bothersome):  change in sex drive or performance  changes in breast size in both males and females  changes in emotions or moods  headache  hot flashes  irritation at site where injected  loss of appetite  skin problems like acne, dry skin  vaginal dryness This list may not describe all possible side effects. Call your doctor for medical advice about side effects. You may report side effects to FDA at 1-800-FDA-1088. Where should I keep my medicine? This drug is given in a hospital or clinic and will not be stored at home. NOTE: This sheet is a summary. It may not cover all possible information. If you have questions about this medicine, talk to your doctor, pharmacist, or health care provider.  2020 Elsevier/Gold Standard (2018-07-21 14:05:56) Pegfilgrastim injection What is this medicine? PEGFILGRASTIM (PEG fil gra stim) is a long-acting granulocyte colony-stimulating factor that stimulates the growth of neutrophils, a type of white blood cell important in the  body's fight against infection. It is used to reduce the incidence of fever and infection in patients with certain types of cancer who are receiving chemotherapy that affects the bone marrow, and to increase survival after being exposed to high doses of radiation. This medicine may be used for other purposes; ask your health care provider or pharmacist if you have questions. COMMON BRAND NAME(S): Fulphila, Neulasta, UDENYCA, Ziextenzo What should I tell my health care provider before I take this medicine? They need to know if you have any of these conditions:  kidney disease  latex allergy  ongoing radiation therapy  sickle cell disease  skin reactions to acrylic adhesives (On-Body Injector only)  an unusual or allergic reaction to pegfilgrastim, filgrastim, other medicines, foods, dyes, or preservatives  pregnant or trying to get pregnant  breast-feeding How should I use this medicine? This medicine is for injection under the skin. If you get this medicine at home, you will be taught how to prepare and give the pre-filled syringe or how to use the On-body Injector. Refer to the patient Instructions for Use for detailed instructions. Use exactly as directed. Tell your healthcare provider immediately if you suspect that the On-body Injector may not have performed as intended or if   you suspect the use of the On-body Injector resulted in a missed or partial dose. It is important that you put your used needles and syringes in a special sharps container. Do not put them in a trash can. If you do not have a sharps container, call your pharmacist or healthcare provider to get one. Talk to your pediatrician regarding the use of this medicine in children. While this drug may be prescribed for selected conditions, precautions do apply. Overdosage: If you think you have taken too much of this medicine contact a poison control center or emergency room at once. NOTE: This medicine is only for you. Do  not share this medicine with others. What if I miss a dose? It is important not to miss your dose. Call your doctor or health care professional if you miss your dose. If you miss a dose due to an On-body Injector failure or leakage, a new dose should be administered as soon as possible using a single prefilled syringe for manual use. What may interact with this medicine? Interactions have not been studied. Give your health care provider a list of all the medicines, herbs, non-prescription drugs, or dietary supplements you use. Also tell them if you smoke, drink alcohol, or use illegal drugs. Some items may interact with your medicine. This list may not describe all possible interactions. Give your health care provider a list of all the medicines, herbs, non-prescription drugs, or dietary supplements you use. Also tell them if you smoke, drink alcohol, or use illegal drugs. Some items may interact with your medicine. What should I watch for while using this medicine? You may need blood work done while you are taking this medicine. If you are going to need a MRI, CT scan, or other procedure, tell your doctor that you are using this medicine (On-Body Injector only). What side effects may I notice from receiving this medicine? Side effects that you should report to your doctor or health care professional as soon as possible:  allergic reactions like skin rash, itching or hives, swelling of the face, lips, or tongue  back pain  dizziness  fever  pain, redness, or irritation at site where injected  pinpoint red spots on the skin  red or dark-brown urine  shortness of breath or breathing problems  stomach or side pain, or pain at the shoulder  swelling  tiredness  trouble passing urine or change in the amount of urine Side effects that usually do not require medical attention (report to your doctor or health care professional if they continue or are bothersome):  bone pain  muscle  pain This list may not describe all possible side effects. Call your doctor for medical advice about side effects. You may report side effects to FDA at 1-800-FDA-1088. Where should I keep my medicine? Keep out of the reach of children. If you are using this medicine at home, you will be instructed on how to store it. Throw away any unused medicine after the expiration date on the label. NOTE: This sheet is a summary. It may not cover all possible information. If you have questions about this medicine, talk to your doctor, pharmacist, or health care provider.  2020 Elsevier/Gold Standard (2017-07-08 16:57:08)  

## 2018-12-20 ENCOUNTER — Encounter: Payer: Self-pay | Admitting: Hematology

## 2018-12-24 ENCOUNTER — Telehealth: Payer: Self-pay | Admitting: Hematology

## 2018-12-24 NOTE — Telephone Encounter (Signed)
Returned patient's phone call regarding rescheduling 09/21 appointments, per patient's request appointment time has been changed.

## 2018-12-25 ENCOUNTER — Inpatient Hospital Stay (HOSPITAL_BASED_OUTPATIENT_CLINIC_OR_DEPARTMENT_OTHER): Payer: BC Managed Care – PPO | Admitting: Medical

## 2018-12-25 ENCOUNTER — Inpatient Hospital Stay: Payer: BC Managed Care – PPO

## 2018-12-25 ENCOUNTER — Telehealth: Payer: Self-pay | Admitting: Emergency Medicine

## 2018-12-25 ENCOUNTER — Other Ambulatory Visit: Payer: Self-pay

## 2018-12-25 ENCOUNTER — Other Ambulatory Visit: Payer: Self-pay | Admitting: Emergency Medicine

## 2018-12-25 VITALS — BP 103/71 | HR 54 | Temp 98.3°F | Resp 18 | Ht 69.0 in | Wt 136.1 lb

## 2018-12-25 DIAGNOSIS — Z170 Estrogen receptor positive status [ER+]: Secondary | ICD-10-CM

## 2018-12-25 DIAGNOSIS — Z5112 Encounter for antineoplastic immunotherapy: Secondary | ICD-10-CM | POA: Diagnosis not present

## 2018-12-25 DIAGNOSIS — C50811 Malignant neoplasm of overlapping sites of right female breast: Secondary | ICD-10-CM

## 2018-12-25 DIAGNOSIS — T148XXA Other injury of unspecified body region, initial encounter: Secondary | ICD-10-CM

## 2018-12-25 LAB — CBC WITH DIFFERENTIAL (CANCER CENTER ONLY)
Abs Immature Granulocytes: 0.2 10*3/uL — ABNORMAL HIGH (ref 0.00–0.07)
Basophils Absolute: 0.1 10*3/uL (ref 0.0–0.1)
Basophils Relative: 1 %
Eosinophils Absolute: 0.2 10*3/uL (ref 0.0–0.5)
Eosinophils Relative: 1 %
HCT: 35.5 % — ABNORMAL LOW (ref 36.0–46.0)
Hemoglobin: 11.3 g/dL — ABNORMAL LOW (ref 12.0–15.0)
Immature Granulocytes: 1 %
Lymphocytes Relative: 13 %
Lymphs Abs: 1.9 10*3/uL (ref 0.7–4.0)
MCH: 30.8 pg (ref 26.0–34.0)
MCHC: 31.8 g/dL (ref 30.0–36.0)
MCV: 96.7 fL (ref 80.0–100.0)
Monocytes Absolute: 1.2 10*3/uL — ABNORMAL HIGH (ref 0.1–1.0)
Monocytes Relative: 8 %
Neutro Abs: 11.5 10*3/uL — ABNORMAL HIGH (ref 1.7–7.7)
Neutrophils Relative %: 76 %
Platelet Count: 87 10*3/uL — ABNORMAL LOW (ref 150–400)
RBC: 3.67 MIL/uL — ABNORMAL LOW (ref 3.87–5.11)
RDW: 12.9 % (ref 11.5–15.5)
WBC Count: 15 10*3/uL — ABNORMAL HIGH (ref 4.0–10.5)
nRBC: 0 % (ref 0.0–0.2)

## 2018-12-25 NOTE — Patient Instructions (Signed)
COVID-19: How to Protect Yourself and Others Know how it spreads  There is currently no vaccine to prevent coronavirus disease 2019 (COVID-19).  The best way to prevent illness is to avoid being exposed to this virus.  The virus is thought to spread mainly from person-to-person. ? Between people who are in close contact with one another (within about 6 feet). ? Through respiratory droplets produced when an infected person coughs, sneezes or talks. ? These droplets can land in the mouths or noses of people who are nearby or possibly be inhaled into the lungs. ? Some recent studies have suggested that COVID-19 may be spread by people who are not showing symptoms. Everyone should Clean your hands often  Wash your hands often with soap and water for at least 20 seconds especially after you have been in a public place, or after blowing your nose, coughing, or sneezing.  If soap and water are not readily available, use a hand sanitizer that contains at least 60% alcohol. Cover all surfaces of your hands and rub them together until they feel dry.  Avoid touching your eyes, nose, and mouth with unwashed hands. Avoid close contact  Stay home if you are sick.  Avoid close contact with people who are sick.  Put distance between yourself and other people. ? Remember that some people without symptoms may be able to spread virus. ? This is especially important for people who are at higher risk of getting very sick.www.cdc.gov/coronavirus/2019-ncov/need-extra-precautions/people-at-higher-risk.html Cover your mouth and nose with a cloth face cover when around others  You could spread COVID-19 to others even if you do not feel sick.  Everyone should wear a cloth face cover when they have to go out in public, for example to the grocery store or to pick up other necessities. ? Cloth face coverings should not be placed on young children under age 2, anyone who has trouble breathing, or is unconscious,  incapacitated or otherwise unable to remove the mask without assistance.  The cloth face cover is meant to protect other people in case you are infected.  Do NOT use a facemask meant for a healthcare worker.  Continue to keep about 6 feet between yourself and others. The cloth face cover is not a substitute for social distancing. Cover coughs and sneezes  If you are in a private setting and do not have on your cloth face covering, remember to always cover your mouth and nose with a tissue when you cough or sneeze or use the inside of your elbow.  Throw used tissues in the trash.  Immediately wash your hands with soap and water for at least 20 seconds. If soap and water are not readily available, clean your hands with a hand sanitizer that contains at least 60% alcohol. Clean and disinfect  Clean AND disinfect frequently touched surfaces daily. This includes tables, doorknobs, light switches, countertops, handles, desks, phones, keyboards, toilets, faucets, and sinks. www.cdc.gov/coronavirus/2019-ncov/prevent-getting-sick/disinfecting-your-home.html  If surfaces are dirty, clean them: Use detergent or soap and water prior to disinfection.  Then, use a household disinfectant. You can see a list of EPA-registered household disinfectants here. cdc.gov/coronavirus 08/19/2018 This information is not intended to replace advice given to you by your health care provider. Make sure you discuss any questions you have with your health care provider. Document Released: 07/29/2018 Document Revised: 08/27/2018 Document Reviewed: 07/29/2018 [employer]vier Patient Education  2020 [employer]  

## 2018-12-25 NOTE — Telephone Encounter (Signed)
Pt called reporting bruise on L arm and hardened lump developing under it.  Denies pain, recent injury, or any other concerns.  Pt on the way here for [facility] appt to evaluate arm.  High priority scheduling message sent.

## 2018-12-25 NOTE — Progress Notes (Signed)
Pt seen by PA Van only, no RN assessment at this time.  PA aware. 

## 2018-12-26 NOTE — Progress Notes (Signed)
Kim [name] presented to the clinic today to be evaluated for a bruise in her superior anterior left upper extremity after her family encouraged her to come.  She reports having a hard area in the superior mid border of this bruise.  She denies any trauma.  She denies hematuria, appetite excess, melena, bright red blood per rectum, for vaginal bleeding.  Her labs were reviewed.  A CBC returned with a WBC of 15, hemoglobin 11.3, hematocrit 35.3, and platelet count at 87.  Brief exam showed a bruise in the left anterior upper extremity which is resolving.  There was also a cyst like density in the superior mid bruise.  The patient was instructed to return as needed.  Sandi Mealy, MHS, PA-C Physician Assistant

## 2019-01-05 ENCOUNTER — Inpatient Hospital Stay: Payer: BC Managed Care – PPO

## 2019-01-05 ENCOUNTER — Other Ambulatory Visit: Payer: Self-pay

## 2019-01-05 ENCOUNTER — Other Ambulatory Visit: Payer: BC Managed Care – PPO

## 2019-01-05 ENCOUNTER — Encounter: Payer: Self-pay | Admitting: Nurse Practitioner

## 2019-01-05 ENCOUNTER — Inpatient Hospital Stay (HOSPITAL_BASED_OUTPATIENT_CLINIC_OR_DEPARTMENT_OTHER): Payer: BC Managed Care – PPO | Admitting: Nurse Practitioner

## 2019-01-05 ENCOUNTER — Ambulatory Visit: Payer: BC Managed Care – PPO

## 2019-01-05 ENCOUNTER — Ambulatory Visit: Payer: BC Managed Care – PPO | Admitting: Hematology

## 2019-01-05 VITALS — BP 103/74 | HR 56 | Temp 98.0°F | Resp 16 | Ht 69.0 in | Wt 137.2 lb

## 2019-01-05 DIAGNOSIS — Z170 Estrogen receptor positive status [ER+]: Secondary | ICD-10-CM

## 2019-01-05 DIAGNOSIS — C50811 Malignant neoplasm of overlapping sites of right female breast: Secondary | ICD-10-CM

## 2019-01-05 DIAGNOSIS — Z5112 Encounter for antineoplastic immunotherapy: Secondary | ICD-10-CM | POA: Diagnosis not present

## 2019-01-05 DIAGNOSIS — Z23 Encounter for immunization: Secondary | ICD-10-CM

## 2019-01-05 LAB — CBC WITH DIFFERENTIAL (CANCER CENTER ONLY)
Abs Immature Granulocytes: 0.02 10*3/uL (ref 0.00–0.07)
Basophils Absolute: 0 10*3/uL (ref 0.0–0.1)
Basophils Relative: 1 %
Eosinophils Absolute: 0.1 10*3/uL (ref 0.0–0.5)
Eosinophils Relative: 3 %
HCT: 35.7 % — ABNORMAL LOW (ref 36.0–46.0)
Hemoglobin: 11.5 g/dL — ABNORMAL LOW (ref 12.0–15.0)
Immature Granulocytes: 1 %
Lymphocytes Relative: 23 %
Lymphs Abs: 1 10*3/uL (ref 0.7–4.0)
MCH: 31.1 pg (ref 26.0–34.0)
MCHC: 32.2 g/dL (ref 30.0–36.0)
MCV: 96.5 fL (ref 80.0–100.0)
Monocytes Absolute: 0.3 10*3/uL (ref 0.1–1.0)
Monocytes Relative: 8 %
Neutro Abs: 2.7 10*3/uL (ref 1.7–7.7)
Neutrophils Relative %: 64 %
Platelet Count: 150 10*3/uL (ref 150–400)
RBC: 3.7 MIL/uL — ABNORMAL LOW (ref 3.87–5.11)
RDW: 13.4 % (ref 11.5–15.5)
WBC Count: 4.1 10*3/uL (ref 4.0–10.5)
nRBC: 0 % (ref 0.0–0.2)

## 2019-01-05 LAB — CMP (CANCER CENTER ONLY)
ALT: 22 U/L (ref 0–44)
AST: 12 U/L — ABNORMAL LOW (ref 15–41)
Albumin: 4.6 g/dL (ref 3.5–5.0)
Alkaline Phosphatase: 96 U/L (ref 38–126)
Anion gap: 9 (ref 5–15)
BUN: 13 mg/dL (ref 6–20)
CO2: 28 mmol/L (ref 22–32)
Calcium: 9.7 mg/dL (ref 8.9–10.3)
Chloride: 104 mmol/L (ref 98–111)
Creatinine: 0.76 mg/dL (ref 0.44–1.00)
GFR, Est AFR Am: >60 mL/min (ref >60)
GFR, Estimated: >60 mL/min (ref >60)
Glucose, Bld: 118 mg/dL — ABNORMAL HIGH (ref 70–99)
Potassium: 4.2 mmol/L (ref 3.5–5.1)
Sodium: 141 mmol/L (ref 135–145)
Total Bilirubin: 0.3 mg/dL (ref 0.3–1.2)
Total Protein: 7.5 g/dL (ref 6.5–8.1)

## 2019-01-05 MED ORDER — ACETAMINOPHEN 325 MG PO TABS
650.0000 mg | ORAL_TABLET | Freq: Once | ORAL | Status: AC
  Administered 2019-01-05: 650 mg via ORAL

## 2019-01-05 MED ORDER — SODIUM CHLORIDE 0.9 % IV SOLN
3.6000 mg/kg | Freq: Once | INTRAVENOUS | Status: AC
  Administered 2019-01-05: 220 mg via INTRAVENOUS
  Filled 2019-01-05: qty 8

## 2019-01-05 MED ORDER — INFLUENZA VAC SPLIT QUAD 0.5 ML IM SUSY
0.5000 mL | PREFILLED_SYRINGE | Freq: Once | INTRAMUSCULAR | Status: AC
  Administered 2019-01-05: 0.5 mL via INTRAMUSCULAR

## 2019-01-05 MED ORDER — ACETAMINOPHEN 325 MG PO TABS
ORAL_TABLET | ORAL | Status: AC
  Filled 2019-01-05: qty 2

## 2019-01-05 MED ORDER — DIPHENHYDRAMINE HCL 25 MG PO CAPS
50.0000 mg | ORAL_CAPSULE | Freq: Once | ORAL | Status: AC
  Administered 2019-01-05: 50 mg via ORAL

## 2019-01-05 MED ORDER — INFLUENZA VAC SPLIT QUAD 0.5 ML IM SUSY
PREFILLED_SYRINGE | INTRAMUSCULAR | Status: AC
  Filled 2019-01-05: qty 0.5

## 2019-01-05 MED ORDER — SODIUM CHLORIDE 0.9 % IV SOLN
Freq: Once | INTRAVENOUS | Status: AC
  Administered 2019-01-05: 15:00:00 via INTRAVENOUS
  Filled 2019-01-05: qty 250

## 2019-01-05 MED ORDER — HEPARIN SOD (PORK) LOCK FLUSH 100 UNIT/ML IV SOLN
500.0000 [IU] | Freq: Once | INTRAVENOUS | Status: AC | PRN
  Administered 2019-01-05: 500 [IU]
  Filled 2019-01-05: qty 5

## 2019-01-05 MED ORDER — DIPHENHYDRAMINE HCL 25 MG PO CAPS
ORAL_CAPSULE | ORAL | Status: AC
  Filled 2019-01-05: qty 2

## 2019-01-05 MED ORDER — SODIUM CHLORIDE 0.9% FLUSH
10.0000 mL | INTRAVENOUS | Status: DC | PRN
  Administered 2019-01-05: 10 mL
  Filled 2019-01-05: qty 10

## 2019-01-05 NOTE — Progress Notes (Signed)
[place]   Telephone:([phone]) [phone] Fax:(336) [phone]   Clinic Follow up Note   Patient Care Team: [name], MD as PCP - General (Family Medicine) [name], MD as Consulting Physician (General Surgery) [name], MD as Consulting Physician (Hematology) [name], MD as Consulting Physician (Radiation Oncology) [name], MD as Consulting Physician (Obstetrics and Gynecology) [name], RN as Oncology Nurse Navigator [name], RN as Oncology Nurse Navigator 01/05/2019  CHIEF COMPLAINT: f/u multifocal right breast cancer    SUMMARY OF ONCOLOGIC HISTORY: Oncology History Overview Note  Cancer Staging Cancer of overlapping sites of right female breast [facility]) Staging form: Breast, AJCC 8th Edition - Clinical stage from 05/28/2018: Stage IB (cT2, cN1, cM0, G3, ER+, PR+, HER2+) - Signed by [name], MD on 05/28/2018  Malignant neoplasm of lower-outer quadrant of right breast of female, estrogen receptor positive ([place]) Staging form: Breast, AJCC 8th Edition - Clinical stage from 05/28/2018: Stage IIB (cT2, cN1, cM0, G3, ER+, PR+, HER2-) - Unsigned     Cancer of overlapping sites of right female breast ([place])  05/14/2018 Mammogram   Right breast mammogram 05/14/18  IMPRESSION  There is a 1.0 x 0.5 x 1.2 cm lobular hypoechoic mass right breast 7 o'clock position 3 cm from nipple, a 0.8 x 0.6 x 1.2 cm lobular hypoechoic mass right breast 7 o'clock position 2 cm from the nipple, and a 0.9 x 0.5 x 0.8 cm lobular hypoechoic mass right breast 5 o'clock position 3 cm from nipple.  There is a 3.8 x 1.0 x 1.3 cm lobular hypoechoic mass right breast 5 o'clock position 2 cm from nipple.  Multiple thickened right axillary lymph nodes (approximately 15). Measuring up to 1.0 cm in thickness. Adenopathy corresponds with axillary palpable abnormality.  Indeterminate round and punctate calcifications upper-outer quadrant right breast.Indeterminate  round and punctate calcifications upper-outer quadrant right breast.   05/21/2018 Initial Biopsy   Diagnosis 05/21/18  1. Breast, right, needle core biopsy, axilla, 5 o'clock, ribbon clip - INVASIVE MAMMARY CARCINOMA. - MAMMARY CARCINOMA IN SITU. 2. Breast, right, needle core biopsy, 7 o'clock, coil clip - INVASIVE MAMMARY CARCINOMA. - MAMMARY CARCINOMA IN SITU. 3. Lymph node, needle/core biopsy, right axillary LN, hydromark - METASTATIC MAMMARY CARCINOMA.    05/21/2018 Receptors her2   1. PROGNOSTIC INDICATORS Results: The tumor cells are EQUIVOCAL for Her2 (2+);  HER2 **NEGATIVE** by FISH   Estrogen Receptor: 80%, POSITIVE, MODERATE STAINING INTENSITY Progesterone Receptor: 5%, POSITIVE, MODERATE-WEAK STAINING INTENSITY Proliferation Marker Ki67: 10%  3. PROGNOSTIC INDICATORS Results: The tumor cells are POSITIVE for Her2 (3+). Estrogen Receptor: 90%, POSITIVE, STRONG STAINING INTENSITY Progesterone Receptor: 50%, POSITIVE, MODERATE STAINING INTENSITY Proliferation Marker Ki67: 5%   05/26/2018 Initial Diagnosis   Malignant neoplasm of lower-inner quadrant of right breast of female, estrogen receptor positive ([place])   05/27/2018 Mammogram   Left breast mammogram 05/27/18  IMPRESSION: No mammographic evidence of LEFT breast malignancy.   05/28/2018 Cancer Staging   Staging form: Breast, AJCC 8th Edition - Clinical stage from 05/28/2018: Stage IB (cT2, cN1, cM0, G3, ER+, PR+, HER2+) - Signed by [name], MD on 05/28/2018   06/03/2018 Imaging   MR BREAST BILATERAL W WO CONTRAST INC CAD  IMPRESSION: 1. Large enhancing masses and non mass enhancement identified throughout the majority of the right breast. Additionally, there are smaller masses identified within the upper inner and lower inner right breast. Overall findings are concerning for extensive malignancy throughout all 4 quadrants of the right breast.  2. Extensive bulky lymphadenopathy identified within the right axilla.  The matted appearance makes counting the number of abnormal lymph nodes difficult as they are immediately approximated to each other. 3. At least one abnormal appearing right internal mammary lymph node. 4. Nonenhancing lesion within the sternum with associated sternal marrow heterogeneity, raising the possibility of osseous metastatic disease within the sternum. 5. Two enhancing masses within the left breast as described above.     06/05/2018 PET scan   NM PET Image Initial (PI) Skull Base To Thigh  IMPRESSION: 1. Multiple borderline enlarged right axillary lymph nodes identified exhibiting mild to moderate increased uptake. Can not rule out nodal metastasis. Correlation with biopsy recommended. 2. Asymmetric increased uptake within the right breast corresponding to MRI abnormality. 3. Subcentimeter right retropectoral and right internal mammary lymph nodes are again identified and exhibit nonspecific, low level FDG uptake. 4. Noncalcified solid nodule within the lateral right lower lobe measures 9 mm without significant radiotracer uptake. 5. No hypermetabolic osseous lesions identified.     06/09/2018 - 11/03/2018 Chemotherapy   Neoadjuvant chemotherapy TCHP, 06/09/2018-09/22/18. Followed by Maintenance Herceptin/Perjeta q3weeks starting 10/14/18 to continue 1 year treatment. Stopped Herceptin/Perjeta on 11/03/18 due to multiple Positive LN of pathology.    06/10/2018 Pathology Results   Surgical pathology  Diagnosis 1. Breast, left, needle core biopsy, LOQ, barbell - MILD FIBROCYSTIC CHANGES. 2. Breast, left, needle core biopsy, UIQ cylinder - MILD FIBROCYSTIC CHANGES.    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 There were no deleterious mutations in AIP, ALK, APC, ATM, AXIN2,BAP1,  BARD1, BLM, BMPR1A, BRCA1,  BRCA2, BRIP1, CASR, CDC73, CDH1, CDK4, CDKN1B, CDKN1C, CDKN2A (p14ARF), CDKN2A (p16INK4a), CEBPA, CHEK2, CTNNA1, DICER1, DIS3L2, EGFR (c.2369C>T, p.Thr790Met variant only), EPCAM (Deletion/duplication testing only), FH, FLCN, GATA2, GPC3, GREM1 (Promoter region deletion/duplication testing only), HOXB13 (c.251G>A, p.Gly84Glu), HRAS, KIT, MAX, MEN1, MET, MITF (c.952G>A, p.Glu318Lys variant only), MLH1, MSH2, MSH3, MSH6, MUTYH, NBN, NF1, NF2, NTHL1, PDGFRA, PHOX2B, PMS2, POLD1, POLE, POT1, PRKAR1A, PTCH1, PTEN, RAD50, RAD51C, RAD51D, RB1, RECQL4, RET, RUNX1, SDHAF2, SDHA (sequence changes only), SDHB, SDHC, SDHD, SMAD4, SMARCA4, SMARCB1, SMARCE1, STK11, SUFU, TERC, TERT, TMEM127, TP53, TSC1, TSC2, VHL, WRN and WT1.  .   09/24/2018 Breast MRI   Breast [facility] from [facility] on 09/24/18 Right breast: Signal void inferior right breast, posterior depth from biopsy marker clip. Interval decreased size of the right breast, and now symmetric to the left, compared to prior MRI. Resolution of abnormal nonmass enhancement without evidence of residual enhancing mass. No evidence of axillary or internal mammary lymphadenopathy is seen, markedly improved from prior. There is no abnormal skin, nipple, or pectoralis muscle enhancement.  Left breast: There are no suspicious enhancing masses or areas of non-mass enhancement. No evidence of axillary or internal mammary lymphadenopathy is seen. There is no abnormal skin, nipple, or pectoralis muscle enhancement.   10/09/2018 Imaging   CT chest W Contrast 10/09/18  IMPRESSION: 1. Stable 10 mm right lower lobe pulmonary nodule since 06/05/2018. Nodule is not substantially hypermetabolic on the PET-CT. Continued attention on follow-up recommended. 2. No findings to suggest metastatic disease in the thorax.   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 surrounding tissue - Biopsy clips identified - Margin status (see extended anterior margin below)  Invasive carcinoma: Positive anterior-inferior margin  Ductal carcinoma in situ: Positive anterior-inferior margin - Ancillary studies previously reported on [id number](5:00, ribbon clip) Estrogen receptor: Positive (80%) Progesterone receptor: Positive (10%) HER2 IHC: Equivocal (2+) HER2 FISH: Negative HER2/CEP17 ratio: 1.35 Mean HER2 copy number: 1.55 - Ancillary studies previously reported on [id number] (right axillary lymph node) Estrogen receptor: Positive (90%) Progesterone receptor: Positive (20%) HER2 IHC: Positive (3+) - Residual Cancer Burden  Tumor bed size: 41 mm x 20 mm Overall tumor cellularity: 10% Percentage in situ: 60% Number of involved lymph nodes: 7 Diameter of largest metastasis: 6 mm Residual Cancer Burden Score: 3.108 Residual Cancer Burden Class: RCB-II - Other findings: Atypical lobular hyperplasia and columnar cell change with microcalcifications; fibroadenomas with microcalcifications  B: Right axilla, targeted lymph node excision - Six of seven lymph nodes positive for carcinoma (6/7) - Size of largest tumor deposit: 6 mm - Biopsy site identified - Treatment effect: Present - Extracapsular extension: Absent  C: Right axilla, lymph node dissection - One of seven lymph nodes positive for carcinoma, macrometastasis, 6 mm (1/7) - Treatment effect: Absent - Extracapsular extension: Absent  D: Right breast, nipple core biopsy - Negative for carcinoma  E: Breast, right, anterior margin, excision - Microscopic foci of ductal carcinoma in situ - Margin: Negative, <1 mm    11/05/2018 Cancer Staging   Staging form: Breast, AJCC 8th Edition - Pathologic stage from  11/05/2018: No Stage Recommended (ypT1c, pN2a, cM0, G2, ER+, PR+, HER2-) - Signed by [name], MD on 11/23/2018   11/24/2018 -  Chemotherapy   Due to positive LN on surgical pathology her maintance Herceptin/Perjeta was changed to Kadcyla q3weeks for about 14 cycles on 11/24/18     CURRENT THERAPY:  1. Due to positive LN on surgical pathology her maintenance Herceptin/Perjeta was changed to March ARB for about 14 cycles on 11/24/18 2. Started Zoladex for ovarian suppression on 12/19/18  3. Adjuvant RT at [facility], started 12/24/18   INTERVAL HISTORY: Kim [name] returns for f/u and treatment as scheduled. Her treatment was changed to [facility] after surgery recently, she completed cycle 2 on 12/15/18. She began Zoladex for ovarian suppression and received Udenyca on 9/4, abdomen is bruised but otherwise tolerated well. Denies bone pain. She also began adjuvant RT at [facility] on 12/24/18. She is doing well today. Bruise on her left arm is healing, the nodule also smaller. She has fatigue the end of treatment week, but remains functional. She recovers well. Appetite is good. No fever, chills. Mild constipation managed with miralax. No n/v. Denies bleeding. Denies cough, chest pain, dyspnea, leg edema.     MEDICAL HISTORY:  Past Medical History:  Diagnosis Date  . Acid reflux   . Acute bronchitis   . Anemia   . Asthma   . Family history of brain cancer   . Family history of breast cancer   . Family history of prostate cancer   . Migraine     SURGICAL HISTORY: Past Surgical History:  Procedure Laterality Date  . BREAST BIOPSY Right 05/23/2018   x3, malignant  . DILATION AND CURETTAGE OF UTERUS     2006 x1, 2009 x2  . LAPAROTOMY  2008  . PORTACATH PLACEMENT Left 06/06/2018   Procedure: INSERTION PORT-A-CATH POSSIBLE ULTRASOUND;  Surgeon: Marlou Starks,  Sena Hitch, MD;  Location: [place];  Service: General;  Laterality: Left;    I have reviewed the social history and family history with the  patient and they are unchanged from previous note.  ALLERGIES:  has No Known Allergies.  MEDICATIONS:  Current Outpatient Medications  Medication Sig Dispense Refill  . albuterol (PROVENTIL HFA;VENTOLIN HFA) 108 (90 BASE) MCG/ACT inhaler Inhale 2 puffs into the lungs every 6 (six) hours as needed for wheezing or shortness of breath.    . ALPRAZolam (XANAX) 0.5 MG tablet Take 0.25-0.5 mg by mouth at bedtime as needed for anxiety.     Haze Justin Probiotic (BIOGAIA PROBIOTIC) LIQD Take by mouth daily at 8 pm.    . cetirizine (ZYRTEC) 10 MG chewable tablet Chew 10 mg by mouth daily.    . diphenhydrAMINE (BENADRYL) 25 mg capsule Take 25 mg by mouth at bedtime as needed for sleep.    Marland Kitchen ibuprofen (ADVIL) 200 MG tablet Take 400 mg by mouth every 6 (six) hours as needed for headache or moderate pain.    . Melatonin 3 MG CAPS Take 3 mg by mouth at bedtime.    . Polyethylene Glycol 3350 (MIRALAX PO) Take 17 g by mouth daily.    . propranolol (INDERAL) 10 MG tablet Take 5 mg by mouth daily as needed (for shaking hands).      No current facility-administered medications for this visit.     PHYSICAL EXAMINATION: ECOG PERFORMANCE STATUS: 1 - Symptomatic but completely ambulatory  Vitals:   01/05/19 1305  BP: 103/74  Pulse: (!) 56  Resp: 16  Temp: 98 F (36.7 C)  SpO2: 100%   Filed Weights   01/05/19 1305  Weight: 137 lb 3.2 oz (62.2 kg)    GENERAL:alert, no distress and comfortable SKIN: no rash. Bruise to left abdomen and left upper arm. Tiny pea-sized nodule on left upper arm superior to the bruise  EYES: sclera clear LUNGS: clear with normal breathing effort HEART: regular rate & rhythm, no lower extremity edema ABDOMEN:abdomen soft, non-tender and normal bowel sounds Musculoskeletal:no cyanosis of digits  NEURO: alert & oriented x 3 with fluent speech, normal gait PAC without erythema   LABORATORY DATA:  I have reviewed the data as listed CBC Latest Ref Rng & Units 01/05/2019  12/25/2018 12/15/2018  WBC 4.0 - 10.5 K/uL 4.1 15.0(H) 2.3(L)  Hemoglobin 12.0 - 15.0 g/dL 11.5(L) 11.3(L) 11.0(L)  Hematocrit 36.0 - 46.0 % 35.7(L) 35.5(L) 34.2(L)  Platelets 150 - 400 K/uL 150 87(L) 145(L)     CMP Latest Ref Rng & Units 01/05/2019 12/15/2018 11/24/2018  Glucose 70 - 99 mg/dL 118(H) 73 85  BUN 6 - 20 mg/dL '13 16 13  ' Creatinine 0.44 - 1.00 mg/dL 0.76 0.78 0.81  Sodium 135 - 145 mmol/L 141 142 142  Potassium 3.5 - 5.1 mmol/L 4.2 4.1 4.0  Chloride 98 - 111 mmol/L 104 105 106  CO2 22 - 32 mmol/L '28 29 25  ' Calcium 8.9 - 10.3 mg/dL 9.7 9.5 9.6  Total Protein 6.5 - 8.1 g/dL 7.5 7.1 6.9  Total Bilirubin 0.3 - 1.2 mg/dL 0.3 0.4 0.3  Alkaline Phos 38 - 126 U/L 96 68 60  AST 15 - 41 U/L 12(L) 13(L) 10(L)  ALT 0 - 44 U/L '22 24 15      ' RADIOGRAPHIC STUDIES: I have personally reviewed the radiological images as listed and agreed with the findings in the report. No results found.   ASSESSMENT & PLAN: Kim [name] is a 43 y.o. 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 [place] also showed 15abnormallymph nodes. -There is discrepancy of her to test results between breast biopsy and axillary lymph node biopsy -Shecompleted Neoadjuvant TCHP and startedmaintenance Herceptin/Perjeta. She overall had good response to chemo as seen on Breast MRI. She also has 42m right lung nodule, which has been stable -[facility] shows tumor was completely resected however she had multiple (7) positive LNs which predicts high risk of recurrence. HER2 was repeated in 2 of the primary breast tumor which were  all negative. -To reduce her risk of recurrence Dr. FBurr Medicochanged maintenance Herceptin/Perjeta to KJim Likefor 14 treatments starting 11/24/18 after she recovered from surgery. This is [facility]. -To reduce her risk of local recurrence [facility] She plans to do reconstruction surgery after.  -Given ER/PR positive disease, I recommend antiestrogen therapy. Due to AI being more beneficial than Tamoxifen in her young age and high risk disease, she started ovarian suppression with Zoladex on 12/19/18 as she is still prememopausal.  She is agreeable and plan to have BSO in future.  -Dr. FBurr Medicopreviously briefly discussed she is eligible for Neralyx which is oral anti-Her2 treatment. Anticipate she will start after she completes kaycyla  -s/p 2 cycles adjuvant Kadcyla, she tolerated well with mild fatigue.  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 She has discovered a stronger maternal family history of prostate and breast cancerin her 2nd and 3rd degree relatives. -We discussed the risk of future breast cancer from PALB2mu52mion, which depends on her family history. -Dr. FengBurr Medicoviouslyreviewed the risk for other cancers in carriers for PALB2 mutations, including female breast cancer, prostate, ovarian and pancreatic cancer, although these risks have not yet been quantified. While there is an increased risk for other cancers, there are no current guidelines for screening for these cancers, and at this time NCCN does not suggest a discussion of risk reducing salpingo-oophorectomy. -Her mother, 73, 37s recently diagnosed with breast cancertriple negative, probably PALB2 mutation related. She will proceed with b/l mastectomy in 09/2018.Her Sister had an abnormal breast MRI and is undergoing biopsy.   3. Asthma  -mild, continuealbuterol inhaler as  needed -will receive flu vaccine today  4. Diet and fasting -She was overall stable and able to maintain her weight during chemo. -Will monitor. 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 9mm 65m lung nodule  -Her initial staging PET scan showed a 9 mm solid rounded nodule within the lateral right lower lobe, not hypermetabolic -CT chest from 6/25/7/41/28sstable 10mm 92mt lung nodule.  -Her attempted lung biopsy was unsuccessful.  -Will monitor with follow up CT scan in next 3 weeks, ordered today  7. Neutropenia  -She had mild neutropenia since her surgery -added Udenyca on 12/19/18 after Kadcyla -ANC recovered, 2.7 today. Will hold Udenyca with this cycle and give as needed   Disposition:  Ms. BagleySantillanors  stable. She completed 2 cycles of adjuvant Kadcyla, she tolerates well with mild fatigue. She is able to recover well. Labs reviewed, neutropenia resolved, ANC 2.7 today; CBC and CMP otherwise stable. Proceed with next cycle Kadcyla today, hold Udenyca.   She will continue Zoladex injection q4 weeks, she tolerated first dose except bruise at the injection site. Next inj on 10/2. We will discuss AI at her next visit. She started adjuvant [facility] 12/24/[facility], anticipate 6 week course. Plan to start AI after she completes radiation.   She had R lung nodule on initial staging PET scan, CT biopsy attempt was unsuccessful. Will repeat CT in the next few weeks for close f/u.   She will return for f/u and cycle 3 Kadcyla in 3 weeks. I reviewed the plan with Dr. Burr Medico.   Orders Placed This Encounter  Procedures  . CT Chest Wo Contrast    Standing Status:   Future    Standing Expiration Date:   01/05/2020    Order Specific Question:   ** REASON FOR EXAM (FREE TEXT)    Answer:   breast cancer, close monitoring of right  lung nodule. unable to biopsy in 11/2018    Order Specific Question:   Is patient pregnant?    Answer:   No    Order Specific Question:   Preferred imaging location?    Answer:   [facility]    Order Specific Question:   Radiology Contrast Protocol - do NOT remove file path    Answer:   [email or web address]   All questions were answered. The patient knows to call the clinic with any problems, questions or concerns. No barriers to learning was detected. I spent 20 minutes counseling the patient face to face. The total time spent in the appointment was 25 minutes and more than 50% was on counseling and review of test results     [name], NP 01/05/19

## 2019-01-05 NOTE — Patient Instructions (Signed)
Coronavirus (COVID-19) Are you at risk?  Are you at risk for the Coronavirus (COVID-19)?  To be considered HIGH RISK for Coronavirus (COVID-19), you have to meet the following criteria:  . Traveled to [place], [place], [place], [place] or [place]; or in the [place] to [place], [place], [place], or [place]; and have fever, cough, and shortness of breath within the last 2 weeks of travel OR . Been in close contact with a person diagnosed with COVID-19 within the last 2 weeks and have fever, cough, and shortness of breath . IF YOU DO NOT MEET THESE CRITERIA, YOU ARE CONSIDERED LOW RISK FOR COVID-19.  What to do if you are HIGH RISK for COVID-19?  . If you are having a medical emergency, call 911. . Seek medical care right away. Before you go to a doctor's office, urgent care or emergency department, call ahead and tell them about your recent travel, contact with someone diagnosed with COVID-19, and your symptoms. You should receive instructions from your physician's office regarding next steps of care.  . When you arrive at healthcare provider, tell the healthcare staff immediately you have returned from visiting [place], [place], [place], [place] or [place]; or traveled in the [place] to [place], [place], [place], or [place]; in the last two weeks or you have been in close contact with a person diagnosed with COVID-19 in the last 2 weeks.   . Tell the health care staff about your symptoms: fever, cough and shortness of breath. . After you have been seen by a medical provider, you will be either: o Tested for (COVID-19) and discharged home on quarantine except to seek medical care if symptoms worsen, and asked to  - Stay home and avoid contact with others until you get your results (4-5 days)  - Avoid travel on public transportation if possible (such as bus, train, or airplane) or o Sent to the Emergency Department by EMS for evaluation, COVID-19 testing, and possible  admission depending on your condition and test results.  What to do if you are LOW RISK for COVID-19?  Reduce your risk of any infection by using the same precautions used for avoiding the common cold or flu:  . Wash your hands often with soap and warm water for at least 20 seconds.  If soap and water are not readily available, use an alcohol-based hand sanitizer with at least 60% alcohol.  . If coughing or sneezing, cover your mouth and nose by coughing or sneezing into the elbow areas of your shirt or coat, into a tissue or into your sleeve (not your hands). . Avoid shaking hands with others and consider head nods or verbal greetings only. . Avoid touching your eyes, nose, or mouth with unwashed hands.  . Avoid close contact with people who are sick. . Avoid places or events with large numbers of people in one location, like concerts or sporting events. . Carefully consider travel plans you have or are making. . If you are planning any travel outside or inside the [place], visit the CDC's Travelers' Health webpage for the latest health notices. . If you have some symptoms but not all symptoms, continue to monitor at home and seek medical attention if your symptoms worsen. . If you are having a medical emergency, call 911.   ADDITIONAL HEALTHCARE OPTIONS FOR PATIENTS  [facility] / e-Visit: [email or web address]         [facility]: [phone]  Onset   Urgent Care: [facility]: [place] Discharge Instructions for Patients Receiving Chemotherapy  Today you received the following chemotherapy agents Kadcyla  To help prevent nausea and vomiting after your treatment, we encourage you to take your nausea medication Kadcyla   If you develop nausea and vomiting that is not controlled by your nausea medication, call the clinic.   BELOW ARE SYMPTOMS  THAT SHOULD BE REPORTED IMMEDIATELY:  *FEVER GREATER THAN 100.5 F  *CHILLS WITH OR WITHOUT FEVER  NAUSEA AND VOMITING THAT IS NOT CONTROLLED WITH YOUR NAUSEA MEDICATION  *UNUSUAL SHORTNESS OF BREATH  *UNUSUAL BRUISING OR BLEEDING  TENDERNESS IN MOUTH AND THROAT WITH OR WITHOUT PRESENCE OF ULCERS  *URINARY PROBLEMS  *BOWEL PROBLEMS  UNUSUAL RASH Items with * indicate a potential emergency and should be followed up as soon as possible.  Feel free to call the clinic should you have any questions or concerns. The clinic phone number is [phone].  Please show the [facility] at check-in to the Emergency Department and triage nurse.

## 2019-01-06 ENCOUNTER — Telehealth: Payer: Self-pay | Admitting: Hematology

## 2019-01-06 NOTE — Telephone Encounter (Signed)
Scheduled appt per 9/21 los.  Spoke with patient and she is aware of her appt date and time,

## 2019-01-09 ENCOUNTER — Encounter: Payer: Self-pay | Admitting: *Deleted

## 2019-01-14 ENCOUNTER — Encounter: Payer: Self-pay | Admitting: Physical Therapy

## 2019-01-14 ENCOUNTER — Other Ambulatory Visit: Payer: Self-pay | Admitting: *Deleted

## 2019-01-14 DIAGNOSIS — C50811 Malignant neoplasm of overlapping sites of right female breast: Secondary | ICD-10-CM

## 2019-01-14 DIAGNOSIS — Z170 Estrogen receptor positive status [ER+]: Secondary | ICD-10-CM

## 2019-01-15 ENCOUNTER — Encounter: Payer: Self-pay | Admitting: Rehabilitation

## 2019-01-15 ENCOUNTER — Other Ambulatory Visit: Payer: Self-pay

## 2019-01-15 ENCOUNTER — Ambulatory Visit: Payer: BC Managed Care – PPO | Attending: Hematology | Admitting: Rehabilitation

## 2019-01-15 DIAGNOSIS — M25611 Stiffness of right shoulder, not elsewhere classified: Secondary | ICD-10-CM | POA: Diagnosis present

## 2019-01-15 DIAGNOSIS — Z9011 Acquired absence of right breast and nipple: Secondary | ICD-10-CM | POA: Diagnosis present

## 2019-01-15 DIAGNOSIS — M62838 Other muscle spasm: Secondary | ICD-10-CM

## 2019-01-15 DIAGNOSIS — C50311 Malignant neoplasm of lower-inner quadrant of right female breast: Secondary | ICD-10-CM

## 2019-01-15 DIAGNOSIS — Z170 Estrogen receptor positive status [ER+]: Secondary | ICD-10-CM | POA: Diagnosis present

## 2019-01-15 NOTE — Therapy (Signed)
[place], [place], [address] Phone: [phone]   Fax:  [phone]  Physical Therapy Evaluation  Patient Details  Name: Kim [name] MRN: [id number] Date of Birth: Apr 14, 1976 Referring Provider (PT): Dr. Burr Medico   Encounter Date: 01/15/2019  PT End of Session - 01/15/19 1653    Visit Number  1    Number of Visits  6    Date for PT Re-Evaluation  03/06/19    Authorization Type  BC[employer]    PT Start Time  1410    PT Stop Time  1453    PT Time Calculation (min)  43 min    Activity Tolerance  Patient tolerated treatment well    Behavior During Therapy  [facility] for tasks assessed/performed       Past Medical History:  Diagnosis Date  . Acid reflux   . Acute bronchitis   . Anemia   . Asthma   . Family history of brain cancer   . Family history of breast cancer   . Family history of prostate cancer   . Migraine     Past Surgical History:  Procedure Laterality Date  . BREAST BIOPSY Right 05/23/2018   x3, malignant  . DILATION AND CURETTAGE OF UTERUS     2006 x1, 2009 x2  . LAPAROTOMY  2008  . PORTACATH PLACEMENT Left 06/06/2018   Procedure: INSERTION PORT-A-CATH POSSIBLE ULTRASOUND;  Surgeon: [name], MD;  Location: [facility];  Service: General;  Laterality: Left;    There were no vitals filed for this visit.   Subjective Assessment - 01/15/19 1415    Subjective  I am doing well already.  I am a professional violinist.  I do have some cording and want to prevent the lymphedema.  I just feel tight.    Pertinent History  Neoadjuvant chemotherapy 06/09/18-11/03/18 [facility] followed by maintenance Kadcyla x 1 year. PALB2 genetic mutation noted. 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 Will have Rt mastectomy with immediate construction with implant switch.  6/7 lymph nodes positive for  carcinoma during [facility] and 1/7 positive during [facility].  On Zoladex for ovarian suppression and started radiation at [facility] 12/24/18 and halfway through radiation.  Indeterminate lung nodule.  Other health history unremarkable    Patient Stated Goals  I would like to keep on top of everything    Currently in Pain?  No/denies   if I start moving it around it can be tender in the axilla        [facility] PT Assessment - 01/15/19 0001      Assessment   Medical Diagnosis  right breast cancer    Referring Provider (PT)  Dr. Burr Medico    Onset Date/Surgical Date  06/09/18    Hand Dominance  Right    Next MD Visit  radiation as needed    Prior Therapy  no      Precautions   Precaution Comments  lymphedema right arm      Restrictions   Weight Bearing Restrictions  No      Balance Screen   Has the patient fallen in the past 6 months  No    Has the patient had a decrease in activity level because of a fear of falling?   No    Is the patient reluctant to leave their home because of a fear of falling?   No  Home Environment   Living Environment  Private residence    Living Arrangements  Spouse/significant other;Children    Available Help at Discharge  Family    Type of [place]      Prior Function   Level of [place] time employment    Leisure  violin professor       Cognition   Overall Cognitive Status  Within Functional Limits for tasks assessed      Observation/Other Assessments   Observations  well heaing incision with slight redness from radiation.  Pt does have a nodule present in the axilla that pt said was a blood vessel  and this has been looked at by [place] and the surgeon      Sensation   Light Touch  Appears Intact      Coordination   Gross Motor Movements are Fluid and Coordinated  Yes      Posture/Postural Control   Posture/Postural Control  Postural limitations    Postural Limitations  Rounded Shoulders      ROM / Strength   AROM / PROM /  Strength  AROM      AROM   AROM Assessment Site  Shoulder    Right/Left Shoulder  Right;Left    Right Shoulder Flexion  160 Degrees    Right Shoulder ABduction  162 Degrees    Right Shoulder Internal Rotation  75 Degrees    Right Shoulder External Rotation  90 Degrees    Right Shoulder Horizontal ABduction  22 Degrees    Left Shoulder Flexion  170 Degrees    Left Shoulder ABduction  168 Degrees    Left Shoulder Internal Rotation  60 Degrees    Left Shoulder External Rotation  90 Degrees    Left Shoulder Horizontal ABduction  35 Degrees      Palpation   Palpation comment  cording may have been present into the left arm initially as pt reported cording palpable here but that it seems to have gone away and with some popping having been audible.  Cording vs edge of muscle belly in the axilla.          LYMPHEDEMA/ONCOLOGY QUESTIONNAIRE - 01/15/19 1427      Type   Cancer Type  right breast cancer      Surgeries   Mastectomy Date  10/21/18    Axillary Lymph Node Dissection Date  10/21/18    Number Lymph Nodes Removed  14   7 positive     Treatment   Active Chemotherapy Treatment  Yes    Active Radiation Treatment  Yes    Current Hormone Treatment  No      What other symptoms do you have   Are you Having Heaviness or Tightness  No      Lymphedema Assessments   Lymphedema Assessments  Upper extremities      Right Upper Extremity Lymphedema   15 cm Proximal to Olecranon Process  24.8 cm    10 cm Proximal to Olecranon Process  23.6 cm    Olecranon Process  23.7 cm    15 cm Proximal to Ulnar Styloid Process  22.9 cm    10 cm Proximal to Ulnar Styloid Process  19.3 cm    Just Proximal to Ulnar Styloid Process  15.7 cm    Across Hand at [employer]  21.5 cm    At [place] of 2nd Digit  6.5 cm    Other  10.3      Left Upper Extremity Lymphedema   15 cm Proximal to Olecranon Process  25.4 cm    10 cm Proximal to Olecranon Process  25.6 cm    Olecranon Process  24.5 cm    15  cm Proximal to Ulnar Styloid Process  22.3 cm    10 cm Proximal to Ulnar Styloid Process  19.1 cm    Just Proximal to Ulnar Styloid Process  16 cm    Across Hand at [employer]  21.1 cm    At [place] of 2nd Digit  6.2 cm    Other  10.2          Quick Dash - 01/15/19 0001    Open a tight or new jar  Mild difficulty    Do heavy household chores (wash walls, wash floors)  No difficulty    Carry a shopping bag or briefcase  No difficulty    Wash your back  Mild difficulty    Use a knife to cut food  No difficulty    Recreational activities in which you take some force or impact through your arm, shoulder, or hand (golf, hammering, tennis)  Mild difficulty    During the past week, to what extent has your arm, shoulder or hand problem interfered with your normal social activities with family, friends, neighbors, or groups?  Slightly    During the past week, to what extent has your arm, shoulder or hand problem limited your work or other regular daily activities  Slightly    Arm, shoulder, or hand pain.  Mild    Tingling (pins and needles) in your arm, shoulder, or hand  None    Difficulty Sleeping  Mild difficulty    DASH Score  15.91 %        Objective measurements completed on examination: See above findings.      [facility] Adult PT Treatment/Exercise - 01/15/19 0001      Self-Care   Self-Care  Other Self-Care Comments    Other Self-Care Comments   pt will continue with wall walking at home and scapular retractions             PT Education - 01/15/19 1652    Education Details  POC, cording, initial HEP, lmyphedema    Person(s) Educated  Patient    Methods  Explanation    Comprehension  Verbalized understanding          PT Long Term Goals - 01/15/19 1700      PT LONG TERM GOAL #1   Title  Pt will improve right shoulder flexion to 170 to equal the opposite side    Baseline  160 on 10/1    Time  8    Period  Weeks    Status  New      PT LONG TERM GOAL #2    Title  Pt will improve right shoulder abduction to 168 to equal the opposite side    Baseline  162 on 10/1    Time  8    Period  Weeks    Status  New      PT LONG TERM GOAL #3   Title  Pt will receive appropriate compression garment with education on use    Time  8    Period  Weeks    Status  New      PT LONG TERM GOAL #4   Title  Pt will be ind with final HEP for  continued mobility and strengthening    Time  8    Period  Weeks    Status  New             Plan - 01/15/19 1655    Clinical Impression Statement  Pt presents with decreased shoulder ROM, feelings of tightness in the pectoralis and chest post expander placement, and high risk of lymphedema with diagnosis, treatments, and profession.  Discussed sleeves today and pt will get measured for one to have prophylactically due to violin playing.  Cording is possibly present but may have resolved per pt report.    Personal Factors and Comorbidities  Comorbidity 2    Comorbidities  lymph node removal, radiation currently    Examination-Activity Limitations  Reach Overhead    Stability/Clinical Decision Making  Stable/Uncomplicated    Clinical Decision Making  Low    Rehab Potential  Excellent    PT Frequency  1x / week    PT Duration  8 weeks    PT Treatment/Interventions  ADLs/Self Care Home Management;Therapeutic activities;Therapeutic exercise;Patient/family education;Manual lymph drainage;Manual techniques;Passive range of motion;Scar mobilization    PT Next Visit Plan  how is skin from radiation? start left shoulder PROM and STM as able to decrease pectoralis and axillary tightness (nodule in axilla has been checked), issue initial HEP to consist of stretches, working towards final stretches and either strength ABC or theraband exercises    Consulted and Agree with Plan of Care  Patient       Patient will benefit from skilled therapeutic intervention in order to improve the following deficits and impairments:  Decreased  scar mobility, Decreased range of motion, Decreased mobility, Decreased skin integrity  Visit Diagnosis: Malignant neoplasm of lower-inner quadrant of right breast of female, estrogen receptor positive ([place])  H/O right mastectomy  Stiffness of right shoulder, not elsewhere classified  Other muscle spasm     Problem List Patient Active Problem List   Diagnosis Date Noted  . Port-A-Cath in place 06/30/2018  . Family history of brain cancer   . Family history of prostate cancer   . Family history of breast cancer   . Cancer of overlapping sites of right female breast ([place]) 05/26/2018  . Postpartum care following vaginal delivery -twins (3/2) 06/17/2014  . Dichorionic diamniotic twin pregnancy 06/15/2014    Stark Bray 01/15/2019, [place], [place], [address] Phone: [phone]   Fax:  [phone]  Name: Gwendloyn Forsee MRN: [id number] Date of Birth: [birth year]

## 2019-01-16 ENCOUNTER — Other Ambulatory Visit: Payer: Self-pay

## 2019-01-16 ENCOUNTER — Inpatient Hospital Stay: Payer: BC Managed Care – PPO | Attending: Hematology

## 2019-01-16 VITALS — BP 97/71 | HR 53 | Temp 98.5°F | Resp 16

## 2019-01-16 DIAGNOSIS — D6481 Anemia due to antineoplastic chemotherapy: Secondary | ICD-10-CM | POA: Insufficient documentation

## 2019-01-16 DIAGNOSIS — Z5111 Encounter for antineoplastic chemotherapy: Secondary | ICD-10-CM | POA: Insufficient documentation

## 2019-01-16 DIAGNOSIS — C50811 Malignant neoplasm of overlapping sites of right female breast: Secondary | ICD-10-CM | POA: Diagnosis present

## 2019-01-16 DIAGNOSIS — Z5112 Encounter for antineoplastic immunotherapy: Secondary | ICD-10-CM | POA: Diagnosis not present

## 2019-01-16 DIAGNOSIS — R911 Solitary pulmonary nodule: Secondary | ICD-10-CM | POA: Insufficient documentation

## 2019-01-16 DIAGNOSIS — J45909 Unspecified asthma, uncomplicated: Secondary | ICD-10-CM | POA: Insufficient documentation

## 2019-01-16 DIAGNOSIS — D709 Neutropenia, unspecified: Secondary | ICD-10-CM | POA: Insufficient documentation

## 2019-01-16 DIAGNOSIS — Z170 Estrogen receptor positive status [ER+]: Secondary | ICD-10-CM | POA: Diagnosis not present

## 2019-01-16 DIAGNOSIS — Z95828 Presence of other vascular implants and grafts: Secondary | ICD-10-CM

## 2019-01-16 MED ORDER — GOSERELIN ACETATE 3.6 MG ~~LOC~~ IMPL
DRUG_IMPLANT | SUBCUTANEOUS | Status: AC
  Filled 2019-01-16: qty 3.6

## 2019-01-16 MED ORDER — GOSERELIN ACETATE 3.6 MG ~~LOC~~ IMPL
3.6000 mg | DRUG_IMPLANT | Freq: Once | SUBCUTANEOUS | Status: AC
  Administered 2019-01-16: 3.6 mg via SUBCUTANEOUS

## 2019-01-16 NOTE — Patient Instructions (Signed)

## 2019-01-23 ENCOUNTER — Ambulatory Visit (HOSPITAL_COMMUNITY)
Admission: RE | Admit: 2019-01-23 | Discharge: 2019-01-23 | Disposition: A | Discharge status: Home / Self Care | Payer: BC Managed Care – PPO | Source: Ambulatory Visit | Attending: Nurse Practitioner | Admitting: Nurse Practitioner

## 2019-01-23 ENCOUNTER — Ambulatory Visit: Payer: BC Managed Care – PPO | Admitting: Rehabilitation

## 2019-01-23 ENCOUNTER — Other Ambulatory Visit: Payer: Self-pay

## 2019-01-23 ENCOUNTER — Encounter: Payer: Self-pay | Admitting: Rehabilitation

## 2019-01-23 DIAGNOSIS — Z170 Estrogen receptor positive status [ER+]: Secondary | ICD-10-CM | POA: Insufficient documentation

## 2019-01-23 DIAGNOSIS — M62838 Other muscle spasm: Secondary | ICD-10-CM

## 2019-01-23 DIAGNOSIS — Z9011 Acquired absence of right breast and nipple: Secondary | ICD-10-CM

## 2019-01-23 DIAGNOSIS — M25611 Stiffness of right shoulder, not elsewhere classified: Secondary | ICD-10-CM

## 2019-01-23 DIAGNOSIS — C50311 Malignant neoplasm of lower-inner quadrant of right female breast: Secondary | ICD-10-CM

## 2019-01-23 DIAGNOSIS — C50811 Malignant neoplasm of overlapping sites of right female breast: Secondary | ICD-10-CM | POA: Insufficient documentation

## 2019-01-23 NOTE — Patient Instructions (Signed)
Access Code: [email or web address]  URL: [email or web address]  Date: 01/23/2019  Prepared by: Shan Levans   Exercises  Ulnar Nerve Butterfly - 3 reps - 1 sets - 10-20 second hold - 1-2x daily - 7x weekly  Ulnar nerve towel sliders - 2 reps - 1 sets - 5 second hold - 1-2x daily - 7x weekly  Standing Median Nerve Glide - 10 reps - 1 sets - 5 second hold - 1-2x daily - 7x weekly  Standing shoulder flexion wall slides - 3 reps - 1 sets - 10 hold - 1x daily - 7x weekly  Standing Shoulder Abduction Slides at Wall - 3 reps - 1 sets - 10 second hold - 1x daily - 7x weekly  Standing Pec Stretch at Wall - 3 reps - 1 sets - 10 second hold - 1x daily - 7x weekly

## 2019-01-26 ENCOUNTER — Inpatient Hospital Stay (HOSPITAL_BASED_OUTPATIENT_CLINIC_OR_DEPARTMENT_OTHER): Payer: BC Managed Care – PPO | Admitting: Nurse Practitioner

## 2019-01-26 ENCOUNTER — Other Ambulatory Visit: Payer: BC Managed Care – PPO

## 2019-01-26 ENCOUNTER — Inpatient Hospital Stay: Payer: BC Managed Care – PPO

## 2019-01-26 ENCOUNTER — Other Ambulatory Visit: Payer: Self-pay

## 2019-01-26 ENCOUNTER — Encounter: Payer: Self-pay | Admitting: Nurse Practitioner

## 2019-01-26 ENCOUNTER — Ambulatory Visit: Payer: BC Managed Care – PPO

## 2019-01-26 ENCOUNTER — Encounter: Payer: Self-pay | Admitting: Rehabilitation

## 2019-01-26 VITALS — BP 101/70 | HR 53 | Temp 98.5°F | Resp 18 | Ht 69.0 in | Wt 135.8 lb

## 2019-01-26 DIAGNOSIS — C50811 Malignant neoplasm of overlapping sites of right female breast: Secondary | ICD-10-CM

## 2019-01-26 DIAGNOSIS — Z170 Estrogen receptor positive status [ER+]: Secondary | ICD-10-CM

## 2019-01-26 DIAGNOSIS — Z95828 Presence of other vascular implants and grafts: Secondary | ICD-10-CM

## 2019-01-26 DIAGNOSIS — Z5112 Encounter for antineoplastic immunotherapy: Secondary | ICD-10-CM | POA: Diagnosis not present

## 2019-01-26 LAB — CBC WITH DIFFERENTIAL (CANCER CENTER ONLY)
Abs Immature Granulocytes: 0 10*3/uL (ref 0.00–0.07)
Basophils Absolute: 0 10*3/uL (ref 0.0–0.1)
Basophils Relative: 2 %
Eosinophils Absolute: 0.1 10*3/uL (ref 0.0–0.5)
Eosinophils Relative: 5 %
HCT: 33.1 % — ABNORMAL LOW (ref 36.0–46.0)
Hemoglobin: 10.9 g/dL — ABNORMAL LOW (ref 12.0–15.0)
Immature Granulocytes: 0 %
Lymphocytes Relative: 21 %
Lymphs Abs: 0.4 10*3/uL — ABNORMAL LOW (ref 0.7–4.0)
MCH: 30.7 pg (ref 26.0–34.0)
MCHC: 32.9 g/dL (ref 30.0–36.0)
MCV: 93.2 fL (ref 80.0–100.0)
Monocytes Absolute: 0.2 10*3/uL (ref 0.1–1.0)
Monocytes Relative: 11 %
Neutro Abs: 1.2 10*3/uL — ABNORMAL LOW (ref 1.7–7.7)
Neutrophils Relative %: 61 %
Platelet Count: 101 10*3/uL — ABNORMAL LOW (ref 150–400)
RBC: 3.55 MIL/uL — ABNORMAL LOW (ref 3.87–5.11)
RDW: 14.2 % (ref 11.5–15.5)
WBC Count: 1.9 10*3/uL — ABNORMAL LOW (ref 4.0–10.5)
nRBC: 0 % (ref 0.0–0.2)

## 2019-01-26 LAB — CMP (CANCER CENTER ONLY)
ALT: 28 U/L (ref 0–44)
AST: 17 U/L (ref 15–41)
Albumin: 4.2 g/dL (ref 3.5–5.0)
Alkaline Phosphatase: 81 U/L (ref 38–126)
Anion gap: 9 (ref 5–15)
BUN: 8 mg/dL (ref 6–20)
CO2: 27 mmol/L (ref 22–32)
Calcium: 9.2 mg/dL (ref 8.9–10.3)
Chloride: 105 mmol/L (ref 98–111)
Creatinine: 0.68 mg/dL (ref 0.44–1.00)
GFR, Est AFR Am: >60 mL/min (ref >60)
GFR, Estimated: >60 mL/min (ref >60)
Glucose, Bld: 114 mg/dL — ABNORMAL HIGH (ref 70–99)
Potassium: 4.1 mmol/L (ref 3.5–5.1)
Sodium: 141 mmol/L (ref 135–145)
Total Bilirubin: 0.3 mg/dL (ref 0.3–1.2)
Total Protein: 7.3 g/dL (ref 6.5–8.1)

## 2019-01-26 MED ORDER — HEPARIN SOD (PORK) LOCK FLUSH 100 UNIT/ML IV SOLN
500.0000 [IU] | Freq: Once | INTRAVENOUS | Status: AC | PRN
  Administered 2019-01-26: 500 [IU]
  Filled 2019-01-26: qty 5

## 2019-01-26 MED ORDER — SODIUM CHLORIDE 0.9% FLUSH
10.0000 mL | INTRAVENOUS | Status: DC | PRN
  Administered 2019-01-26: 10 mL
  Filled 2019-01-26: qty 10

## 2019-01-26 MED ORDER — ACETAMINOPHEN 325 MG PO TABS
ORAL_TABLET | ORAL | Status: AC
  Filled 2019-01-26: qty 2

## 2019-01-26 MED ORDER — SODIUM CHLORIDE 0.9 % IV SOLN
3.6000 mg/kg | Freq: Once | INTRAVENOUS | Status: AC
  Administered 2019-01-26: 220 mg via INTRAVENOUS
  Filled 2019-01-26: qty 8

## 2019-01-26 MED ORDER — SODIUM CHLORIDE 0.9 % IV SOLN
Freq: Once | INTRAVENOUS | Status: AC
  Administered 2019-01-26: 15:00:00 via INTRAVENOUS
  Filled 2019-01-26: qty 250

## 2019-01-26 MED ORDER — SODIUM CHLORIDE 0.9% FLUSH
10.0000 mL | INTRAVENOUS | Status: DC | PRN
  Administered 2019-01-26: 13:00:00 10 mL
  Filled 2019-01-26: qty 10

## 2019-01-26 MED ORDER — DIPHENHYDRAMINE HCL 25 MG PO CAPS
50.0000 mg | ORAL_CAPSULE | Freq: Once | ORAL | Status: AC
  Administered 2019-01-26: 50 mg via ORAL

## 2019-01-26 MED ORDER — DIPHENHYDRAMINE HCL 25 MG PO CAPS
ORAL_CAPSULE | ORAL | Status: AC
  Filled 2019-01-26: qty 2

## 2019-01-26 MED ORDER — ACETAMINOPHEN 325 MG PO TABS
650.0000 mg | ORAL_TABLET | Freq: Once | ORAL | Status: AC
  Administered 2019-01-26: 650 mg via ORAL

## 2019-01-26 NOTE — Progress Notes (Signed)
[place]   [phone] [phone]   Clinic Follow up Note   Patient Care Team: [name], MD as PCP - General (Family Medicine) [name], MD as Consulting Physician (General Surgery) [name], MD as Consulting Physician (Hematology) [name], MD as Consulting Physician (Radiation Oncology) [name], MD as Consulting Physician (Obstetrics and Gynecology) [name], RN as Oncology Nurse Navigator [name], RN as Oncology Nurse Navigator 01/26/2019  CHIEF COMPLAINT: f/u multifocal right breast cancer  SUMMARY OF ONCOLOGIC HISTORY: Oncology History Overview Note  Cancer Staging Cancer of overlapping sites of right female breast [facility]) Staging form: Breast, AJCC 8th Edition - Clinical stage from 05/28/2018: Stage IB (cT2, cN1, cM0, G3, ER+, PR+, HER2+) - Signed by [name], MD on 05/28/2018  Malignant neoplasm of lower-outer quadrant of right breast of female, estrogen receptor positive ([place]) Staging form: Breast, AJCC 8th Edition - Clinical stage from 05/28/2018: Stage IIB (cT2, cN1, cM0, G3, ER+, PR+, HER2-) - Unsigned     Cancer of overlapping sites of right female breast ([place])  05/14/2018 Mammogram   Right breast mammogram 05/14/18  IMPRESSION  There is a 1.0 x 0.5 x 1.2 cm lobular hypoechoic mass right breast 7 o'clock position 3 cm from nipple, a 0.8 x 0.6 x 1.2 cm lobular hypoechoic mass right breast 7 o'clock position 2 cm from the nipple, and a 0.9 x 0.5 x 0.8 cm lobular hypoechoic mass right breast 5 o'clock position 3 cm from nipple.  There is a 3.8 x 1.0 x 1.3 cm lobular hypoechoic mass right breast 5 o'clock position 2 cm from nipple.  Multiple thickened right axillary lymph nodes (approximately 15). Measuring up to 1.0 cm in thickness. Adenopathy corresponds with axillary palpable abnormality.  Indeterminate round and punctate calcifications upper-outer quadrant right breast.Indeterminate  round and punctate calcifications upper-outer quadrant right breast.   05/21/2018 Initial Biopsy   Diagnosis 05/21/18  1. Breast, right, needle core biopsy, axilla, 5 o'clock, ribbon clip - INVASIVE MAMMARY CARCINOMA. - MAMMARY CARCINOMA IN SITU. 2. Breast, right, needle core biopsy, 7 o'clock, coil clip - INVASIVE MAMMARY CARCINOMA. - MAMMARY CARCINOMA IN SITU. 3. Lymph node, needle/core biopsy, right axillary LN, hydromark - METASTATIC MAMMARY CARCINOMA.    05/21/2018 Receptors her2   1. PROGNOSTIC INDICATORS Results: The tumor cells are EQUIVOCAL for Her2 (2+);  HER2 **NEGATIVE** by FISH   Estrogen Receptor: 80%, POSITIVE, MODERATE STAINING INTENSITY Progesterone Receptor: 5%, POSITIVE, MODERATE-WEAK STAINING INTENSITY Proliferation Marker Ki67: 10%  3. PROGNOSTIC INDICATORS Results: The tumor cells are POSITIVE for Her2 (3+). Estrogen Receptor: 90%, POSITIVE, STRONG STAINING INTENSITY Progesterone Receptor: 50%, POSITIVE, MODERATE STAINING INTENSITY Proliferation Marker Ki67: 5%   05/26/2018 Initial Diagnosis   Malignant neoplasm of lower-inner quadrant of right breast of female, estrogen receptor positive ([place])   05/27/2018 Mammogram   Left breast mammogram 05/27/18  IMPRESSION: No mammographic evidence of LEFT breast malignancy.   05/28/2018 Cancer Staging   Staging form: Breast, AJCC 8th Edition - Clinical stage from 05/28/2018: Stage IB (cT2, cN1, cM0, G3, ER+, PR+, HER2+) - Signed by [name], MD on 05/28/2018   06/03/2018 Imaging   MR BREAST BILATERAL W WO CONTRAST INC CAD  IMPRESSION: 1. Large enhancing masses and non mass enhancement identified throughout the majority of the right breast. Additionally, there are smaller masses identified within the upper inner and lower inner right breast. Overall findings are concerning for extensive malignancy throughout all 4 quadrants of the right breast. 2. Extensive  bulky lymphadenopathy identified within the right axilla.  The matted appearance makes counting the number of abnormal lymph nodes difficult as they are immediately approximated to each other. 3. At least one abnormal appearing right internal mammary lymph node. 4. Nonenhancing lesion within the sternum with associated sternal marrow heterogeneity, raising the possibility of osseous metastatic disease within the sternum. 5. Two enhancing masses within the left breast as described above.     06/05/2018 PET scan   NM PET Image Initial (PI) Skull Base To Thigh  IMPRESSION: 1. Multiple borderline enlarged right axillary lymph nodes identified exhibiting mild to moderate increased uptake. Can not rule out nodal metastasis. Correlation with biopsy recommended. 2. Asymmetric increased uptake within the right breast corresponding to MRI abnormality. 3. Subcentimeter right retropectoral and right internal mammary lymph nodes are again identified and exhibit nonspecific, low level FDG uptake. 4. Noncalcified solid nodule within the lateral right lower lobe measures 9 mm without significant radiotracer uptake. 5. No hypermetabolic osseous lesions identified.     06/09/2018 - 11/03/2018 Chemotherapy   Neoadjuvant chemotherapy TCHP, 06/09/2018-09/22/18. Followed by Maintenance Herceptin/Perjeta q3weeks starting 10/14/18 to continue 1 year treatment. Stopped Herceptin/Perjeta on 11/03/18 due to multiple Positive LN of pathology.    06/10/2018 Pathology Results   Surgical pathology  Diagnosis 1. Breast, left, needle core biopsy, LOQ, barbell - MILD FIBROCYSTIC CHANGES. 2. Breast, left, needle core biopsy, UIQ cylinder - MILD FIBROCYSTIC CHANGES.    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 There were no deleterious mutations in AIP, ALK, APC, ATM, AXIN2,BAP1,  BARD1, BLM, BMPR1A, BRCA1,  BRCA2, BRIP1, CASR, CDC73, CDH1, CDK4, CDKN1B, CDKN1C, CDKN2A (p14ARF), CDKN2A (p16INK4a), CEBPA, CHEK2, CTNNA1, DICER1, DIS3L2, EGFR (c.2369C>T, p.Thr790Met variant only), EPCAM (Deletion/duplication testing only), FH, FLCN, GATA2, GPC3, GREM1 (Promoter region deletion/duplication testing only), HOXB13 (c.251G>A, p.Gly84Glu), HRAS, KIT, MAX, MEN1, MET, MITF (c.952G>A, p.Glu318Lys variant only), MLH1, MSH2, MSH3, MSH6, MUTYH, NBN, NF1, NF2, NTHL1, PDGFRA, PHOX2B, PMS2, POLD1, POLE, POT1, PRKAR1A, PTCH1, PTEN, RAD50, RAD51C, RAD51D, RB1, RECQL4, RET, RUNX1, SDHAF2, SDHA (sequence changes only), SDHB, SDHC, SDHD, SMAD4, SMARCA4, SMARCB1, SMARCE1, STK11, SUFU, TERC, TERT, TMEM127, TP53, TSC1, TSC2, VHL, WRN and WT1.  .   09/24/2018 Breast MRI   Breast [facility] from [facility] on 09/24/18 Right breast: Signal void inferior right breast, posterior depth from biopsy marker clip. Interval decreased size of the right breast, and now symmetric to the left, compared to prior MRI. Resolution of abnormal nonmass enhancement without evidence of residual enhancing mass. No evidence of axillary or internal mammary lymphadenopathy is seen, markedly improved from prior. There is no abnormal skin, nipple, or pectoralis muscle enhancement.  Left breast: There are no suspicious enhancing masses or areas of non-mass enhancement. No evidence of axillary or internal mammary lymphadenopathy is seen. There is no abnormal skin, nipple, or pectoralis muscle enhancement.   10/09/2018 Imaging   CT chest W Contrast 10/09/18  IMPRESSION: 1. Stable 10 mm right lower lobe pulmonary nodule since 06/05/2018. Nodule is not substantially hypermetabolic on the PET-CT. Continued attention on follow-up recommended. 2. No findings to suggest metastatic disease in the thorax.   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 surrounding tissue - Biopsy clips identified - Margin status (see extended anterior margin below)  Invasive carcinoma: Positive anterior-inferior margin  Ductal carcinoma in situ: Positive anterior-inferior margin - Ancillary studies previously reported on [id number](5:00, ribbon clip) Estrogen receptor: Positive (80%) Progesterone receptor: Positive (10%) HER2 IHC: Equivocal (2+) HER2 FISH: Negative HER2/CEP17 ratio: 1.35 Mean HER2 copy number: 1.55 - Ancillary studies previously reported on [id number] (right axillary lymph node) Estrogen receptor: Positive (90%) Progesterone receptor: Positive (20%) HER2 IHC: Positive (3+) - Residual Cancer Burden  Tumor bed size: 41 mm x 20 mm Overall tumor cellularity: 10% Percentage in situ: 60% Number of involved lymph nodes: 7 Diameter of largest metastasis: 6 mm Residual Cancer Burden Score: 3.108 Residual Cancer Burden Class: RCB-II - Other findings: Atypical lobular hyperplasia and columnar cell change with microcalcifications; fibroadenomas with microcalcifications  B: Right axilla, targeted lymph node excision - Six of seven lymph nodes positive for carcinoma (6/7) - Size of largest tumor deposit: 6 mm - Biopsy site identified - Treatment effect: Present - Extracapsular extension: Absent  C: Right axilla, lymph node dissection - One of seven lymph nodes positive for carcinoma, macrometastasis, 6 mm (1/7) - Treatment effect: Absent - Extracapsular extension: Absent  D: Right breast, nipple core biopsy - Negative for carcinoma  E: Breast, right, anterior margin, excision - Microscopic foci of ductal carcinoma in situ - Margin: Negative, <1 mm    11/05/2018 Cancer Staging   Staging form: Breast, AJCC 8th Edition - Pathologic stage from  11/05/2018: No Stage Recommended (ypT1c, pN2a, cM0, G2, ER+, PR+, HER2-) - Signed by [name], MD on 11/23/2018   11/24/2018 -  Chemotherapy   Due to positive LN on surgical pathology her maintance Herceptin/Perjeta was changed to Kadcyla q3weeks for about 14 cycles on 11/24/18   01/23/2019 Imaging   CT Chest wo  IMPRESSION: 1. Stable right lower lobe pulmonary nodule. 2. No signs of new or suspicious finding since study of 10/09/2018, now status post right mastectomy, axillary dissection and breast reconstruction.     CURRENT THERAPY:  1. Due to positive LN on surgical pathology hermaintenanceHerceptin/Perjeta was changed to Kadcyla q3weeks for about 14 cycles on 11/24/18 2. Started Zoladex for ovarian suppression on 12/19/18  3. Adjuvant RT at [facility], started 12/24/18  4. PENDING AI starting after RT  INTERVAL HISTORY: Mr. Palazzo returns for f/u and treatment as scheduled. She completed cycle 3 adjuvant Kadcyla on 01/05/19. She continues RT at [facility], due to complete on 10/20. Her breast and chest is red not not very painful unless she has to scratch. A week after treatment she is fatigued for 3 days. She remains able to function and continue tasks, working, etc. She had extreme constipation that was unrelieved with miralax, mag citrate, and stool softener. She eventually used an enema and this resolved. Has always had small amount of rectal blood when severely constipated. She continues to hydrate, eat well and exercise. Denies n/v. No recent fever, chills, cough, chest pain, dyspnea. She has increased dizziness few days ago that improved after long night sleep. She thinks may be she was exhausted and possibly dehydrated. She had not taken new medication, zyrtec, benadryl, or inderal lately. Denies dizziness today. She has intermittent blurry vision with, no headache. Mild tingling under right arm is stable, no other neuropathy.    MEDICAL HISTORY:  Past Medical History:  Diagnosis Date  Acid reflux     Acute bronchitis    Anemia    Asthma    Family history of brain cancer    Family history of breast cancer    Family history of prostate cancer    Migraine     SURGICAL HISTORY: Past Surgical History:  Procedure Laterality Date   BREAST BIOPSY Right 05/23/2018   x3, malignant   DILATION AND CURETTAGE OF UTERUS     2006 x1, 2009 x2   LAPAROTOMY  2008   PORTACATH PLACEMENT Left 06/06/2018   Procedure: INSERTION PORT-A-CATH POSSIBLE ULTRASOUND;  Surgeon: [name], MD;  Location: [place];  Service: General;  Laterality: Left;    I have reviewed the social history and family history with the patient and they are unchanged from previous note.  ALLERGIES:  has No Known Allergies.  MEDICATIONS:  Current Outpatient Medications  Medication Sig Dispense Refill   albuterol (PROVENTIL HFA;VENTOLIN HFA) 108 (90 BASE) MCG/ACT inhaler Inhale 2 puffs into the lungs every 6 (six) hours as needed for wheezing or shortness of breath.     ALPRAZolam (XANAX) 0.5 MG tablet Take 0.25-0.5 mg by mouth at bedtime as needed for anxiety.      BioGaia Probiotic (BIOGAIA PROBIOTIC) LIQD Take by mouth daily at 8 pm.     cetirizine (ZYRTEC) 10 MG chewable tablet Chew 10 mg by mouth daily.     diphenhydrAMINE (BENADRYL) 25 mg capsule Take 25 mg by mouth at bedtime as needed for sleep.     ibuprofen (ADVIL) 200 MG tablet Take 400 mg by mouth every 6 (six) hours as needed for headache or moderate pain.     Melatonin 3 MG CAPS Take 3 mg by mouth at bedtime.     Polyethylene Glycol 3350 (MIRALAX PO) Take 17 g by mouth daily.     propranolol (INDERAL) 10 MG tablet Take 5 mg by mouth daily as needed (for shaking hands).      No current facility-administered medications for this visit.    Facility-Administered Medications Ordered in Other Visits  Medication Dose Route Frequency Provider Last Rate Last Dose   sodium chloride flush (NS) 0.9 % injection 10 mL  10 mL  Intracatheter PRN [name], MD   10 mL at 01/26/19 1620    PHYSICAL EXAMINATION: ECOG PERFORMANCE STATUS: 1 - Symptomatic but completely ambulatory  Vitals:   01/26/19 1305  BP: 101/70  Pulse: (!) 53  Resp: 18  Temp: 98.5 F (36.9 C)  SpO2: 100%   Filed Weights   01/26/19 1305  Weight: 135 lb 12.8 oz (61.6 kg)    GENERAL:alert, no distress and comfortable SKIN: no rash. Moderate skin erythema to right chest, breast, and inframammary fold. Incision is closed.  EYES:  sclera clear LUNGS: clear with normal breathing effort HEART: regular rate & rhythm, no lower extremity edema ABDOMEN:abdomen soft, non-tender   Musculoskeletal:no cyanosis of digits   NEURO: alert & oriented x 3 with fluent speech, normal gait PAC without erythema   LABORATORY DATA:  I have reviewed the data as listed CBC Latest Ref Rng & Units 01/26/2019 01/05/2019 12/25/2018  WBC 4.0 - 10.5 K/uL 1.9(L) 4.1 15.0(H)  Hemoglobin 12.0 - 15.0 g/dL 10.9(L) 11.5(L) 11.3(L)  Hematocrit 36.0 - 46.0 % 33.1(L) 35.7(L) 35.5(L)  Platelets 150 - 400 K/uL 101(L) 150 87(L)     CMP Latest Ref Rng & Units 01/26/2019 01/05/2019 12/15/2018  Glucose 70 - 99 mg/dL 114(H) 118(H) 73  BUN 6 -  20 mg/dL '8 13 16  ' Creatinine 0.44 - 1.00 mg/dL 0.68 0.76 0.78  Sodium 135 - 145 mmol/L 141 141 142  Potassium 3.5 - 5.1 mmol/L 4.1 4.2 4.1  Chloride 98 - 111 mmol/L 105 104 105  CO2 22 - 32 mmol/L '27 28 29  ' Calcium 8.9 - 10.3 mg/dL 9.2 9.7 9.5  Total Protein 6.5 - 8.1 g/dL 7.3 7.5 7.1  Total Bilirubin 0.3 - 1.2 mg/dL 0.3 0.3 0.4  Alkaline Phos 38 - 126 U/L 81 96 68  AST 15 - 41 U/L 17 12(L) 13(L)  ALT 0 - 44 U/L '28 22 24      ' RADIOGRAPHIC STUDIES: I have personally reviewed the radiological images as listed and agreed with the findings in the report. No results found.   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 [place] also showed 15abnormallymph nodes. -There is discrepancy of her to test results between breast biopsy and axillary lymph node biopsy -Shecompleted Neoadjuvant TCHP andstartedmaintenance Herceptin/Perjeta. She overall had good response to chemo as seen on Breast MRI. She also has 26m right lung nodule,which has been stable -[facility] shows tumor was completely resected however she had multiple (7)positive LNs which predicts high risk of recurrence.HER2 was repeated in2 of the primary breast tumor which were all negative. -To reduce her risk of recurrenceDr. FBurr Medicochanged maintenanceHerceptin/Perjeta to KJim Likefor 14 treatmentsstarting 11/24/18 after she recovered from surgery. This is based on the [facility]. -To reduce her risk of local recurrence [facility] She plans to do reconstructionsurgery after.  -Given ER/PR positive disease, we recommend antiestrogen therapy. Due to AI being more beneficial than Tamoxifenin her young age and high risk disease, she started ovarian suppression with Zoladex on 12/19/18 as she is still prememopausal. She would like to have BSO this year, over the holidays preferably when she and her husband are on break from school/work. She has a Ob/GYN who can perform this -Dr. FBurr Medicopreviously briefly discussed she is eligible for Neralyx which isoralanti-Her2 treatment.Anticipate she will start after she completes [place]-Ms. BFreimanappears well. She completed 3 cycles adjuvant Kadcyla, she tolerates well with mild fatigue,  constipation, and cytopenias. She had an episode of dizziness that may have been related to fatigue and possible dehydration. She has recovered well.  -CMP wnl; CBC shows mild neutropenia, ANC 1.2. PLT 101. Will proceed with cycle 4 Kadcyla today without dose adjustment -will plan to give Udenyca if ABelton<1.0  -f/u in 3 weeks with next cycle. Will finalize her AI at that visit. 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 She has discovered a stronger maternal family history of prostate and breast cancerin her 2nd and 3rd degree relatives. -We discussed the risk of future breast cancer from PALB2mu63mion, which depends on her family history. -Dr. FengBurr Medicoviouslyreviewed the risk for other cancers in carriers for PALB2 mutations, including female breast cancer, prostate, ovarian and pancreatic cancer, although these risks have not yet been quantified. While there is an increased risk for other cancers, there are no  current guidelines for screening for these cancers, and at this time NCCN does not suggest a discussion of risk reducing salpingo-oophorectomy. -Her mother, 42, was recently diagnosed with breast cancertriple negative, probably PALB2 mutation related. She will proceed with b/l mastectomy in 09/2018.Her Sister had an abnormal breast MRI and is undergoing biopsy.  3. Asthma  -mild, continuealbuterol inhaler as needed -Received flu vaccine on 10/2  4. Diet and fasting -She was overall stable and able to maintain her weight during chemo. -Will monitor. 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 42m RLL lung nodule  -Her initial staging PET scan showed a 9 mm solid rounded nodule within the lateral right lower lobe, not  hypermetabolic -CT chest from 66/27/03showsstable 133mright lung nodule.  -Her attempted lung biopsy was unsuccessful.  -I reviewed CT chest from 10/9 which shows stable lung nodule, no new or enlarging nodules or concerns.  -Will continue monitoring. I also reviewed this with Dr. FeBurr Medicooday  7. Neutropenia  -She had mild neutropenia since her surgery -added Udenyca on 12/19/18 after Kadcyla for ANC 0.7 on 8/31 -Will give Udenyca if ANC drops <1.0 -Continue without dosage adjustment today  PLAN: -Labs and CT Chest reviewed -Proceed with cycle 4 adjuvant Kadcyla -Continue RT, plan to complete 10/20 -Return in 3 weeks for next cycle and finalize AI -Continue Zoladex until BSO, potentially end of this year  All questions were answered. The patient knows to call the clinic with any problems, questions or concerns. No barriers to learning was detected. I spent 20 minutes counseling the patient face to face. The total time spent in the appointment was  25 minutes and more than 50% was on counseling and review of test results     LaAlla FeelingNP 01/26/19

## 2019-01-26 NOTE — Progress Notes (Signed)
Per Cira Rue NP, OK to treat with ANC 1.2.

## 2019-01-26 NOTE — Patient Instructions (Signed)
[facility] Discharge Instructions for Patients Receiving Chemotherapy  Today you received the following chemotherapy agents Kadcyla  To help prevent nausea and vomiting after your treatment, we encourage you to take your nausea medication as directed   If you develop nausea and vomiting that is not controlled by your nausea medication, call the clinic.   BELOW ARE SYMPTOMS THAT SHOULD BE REPORTED IMMEDIATELY:  *FEVER GREATER THAN 100.5 F  *CHILLS WITH OR WITHOUT FEVER  NAUSEA AND VOMITING THAT IS NOT CONTROLLED WITH YOUR NAUSEA MEDICATION  *UNUSUAL SHORTNESS OF BREATH  *UNUSUAL BRUISING OR BLEEDING  TENDERNESS IN MOUTH AND THROAT WITH OR WITHOUT PRESENCE OF ULCERS  *URINARY PROBLEMS  *BOWEL PROBLEMS  UNUSUAL RASH Items with * indicate a potential emergency and should be followed up as soon as possible.  Feel free to call the clinic should you have any questions or concerns. The clinic phone number is [phone].  Please show the CHEMO ALERT CARD at check-in to the Emergency Department and triage nurse.   

## 2019-01-26 NOTE — Therapy (Signed)
[place], [place], [address] Phone: [phone]   Fax:  [phone]  Physical Therapy Treatment  Patient Details  Name: Kim [name] MRN: [id number] Date of Birth: 1975/05/12 Referring Provider (PT): Dr. Burr Medico   Encounter Date: 01/23/2019    *This note populated on 01/26/19 but consists of treatment on 01/23/19  PT End of Session - 01/26/19 1704    Visit Number  2    Number of Visits  6    Date for PT Re-Evaluation  03/06/19    Authorization Type  [employer]    PT Start Time  1006    PT Stop Time  1046    PT Time Calculation (min)  40 min    Activity Tolerance  Patient tolerated treatment well    Behavior During Therapy  [facility] for tasks assessed/performed       Past Medical History:  Diagnosis Date  . Acid reflux   . Acute bronchitis   . Anemia   . Asthma   . Family history of brain cancer   . Family history of breast cancer   . Family history of prostate cancer   . Migraine     Past Surgical History:  Procedure Laterality Date  . BREAST BIOPSY Right 05/23/2018   x3, malignant  . DILATION AND CURETTAGE OF UTERUS     2006 x1, 2009 x2  . LAPAROTOMY  2008  . PORTACATH PLACEMENT Left 06/06/2018   Procedure: INSERTION PORT-A-CATH POSSIBLE ULTRASOUND;  Surgeon: [name], MD;  Location: [facility];  Service: General;  Laterality: Left;    There were no vitals filed for this visit.  Subjective Assessment - 01/26/19 1317    Subjective  just came from radiation    Pertinent History  Neoadjuvant chemotherapy 06/09/18-11/03/18 [facility] followed by maintenance Kadcyla x 1 year. PALB2 genetic mutation noted. 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 Will have Rt mastectomy with immediate construction with implant switch.  6/7 lymph nodes positive for carcinoma during [facility] and 1/7 positive during  [facility].  On Zoladex for ovarian suppression and started radiation at [facility] 12/24/18 and halfway through radiation.  Indeterminate lung nodule.  Other health history unremarkable    Currently in Pain?  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    Soft tissue mobilization  Rt pectoralis, latissimus and serratius in supine avoiding areas in the radiation field that are starting to get some redness    Passive ROM  of the Rt shoulder to tolerance with pectoralis and cording stretch position    Neural Stretch  for Rt cording as well as median and ulnar nerve testing revealing reproduction of some anterior arm pain with ulanr ULTT                  PT Long Term Goals - 01/15/19 1700  PT LONG TERM GOAL #1   Title  Pt will improve right shoulder flexion to 170 to equal the opposite side    Baseline  160 on 10/1    Time  8    Period  Weeks    Status  New      PT LONG TERM GOAL #2   Title  Pt will improve right shoulder abduction to 168 to equal the opposite side    Baseline  162 on 10/1    Time  8    Period  Weeks    Status  New      PT LONG TERM GOAL #3   Title  Pt will receive appropriate compression garment with education on use    Time  8    Period  Weeks    Status  New      PT LONG TERM GOAL #4   Title  Pt will be ind with final HEP for continued mobility and strengthening    Time  8    Period  Weeks    Status  New            Plan - 01/26/19 1705    Clinical Impression Statement  Started manual treatment today with focus on STM and release of the Rt chest wall avoiding any  places that are starting to get red from radiaion.  Mild cording evident in the antecubital fossa but not proximally although pt reports some pull here.  Pain in the upper arm also reproduced with ulnar nerve ULTT today.  Updated HEP to include neural and cording release stretches.    PT Frequency  1x / week    PT Duration  8 weeks    PT Treatment/Interventions  ADLs/Self Care Home Management;Therapeutic activities;Therapeutic exercise;Patient/family education;Manual lymph drainage;Manual techniques;Passive range of motion;Scar mobilization    PT Next Visit Plan  script back? how is skin from radiation? cont right shoulder PROM and STM as able to decrease pectoralis and axillary tightness (nodule in axilla has been checked), review initial HEP to consist of stretches, working towards final stretches and either strength ABC or theraband exercises    Recommended Other Services  getting a sleeve from [employer] and Agree with Plan of Care  Patient       Patient will benefit from skilled therapeutic intervention in order to improve the following deficits and impairments:     Visit Diagnosis: Malignant neoplasm of lower-inner quadrant of right breast of female, estrogen receptor positive ([place])  H/O right mastectomy  Stiffness of right shoulder, not elsewhere classified  Other muscle spasm     Problem List Patient Active Problem List   Diagnosis Date Noted  . Port-A-Cath in place 06/30/2018  . Family history of brain cancer   . Family history of prostate cancer   . Family history of breast cancer   . Cancer of overlapping sites of right female breast ([place]) 05/26/2018  . Postpartum care following vaginal delivery -twins (3/2) 06/17/2014  . Dichorionic diamniotic twin pregnancy 06/15/2014    Stark Bray 01/26/2019, 5:09 PM  [place], [place], [address] Phone: [phone]   Fax:   [phone]  Name: Kim [name] MRN: [id number] Date of Birth: 1975-04-28

## 2019-01-27 ENCOUNTER — Telehealth: Payer: Self-pay | Admitting: Nurse Practitioner

## 2019-01-27 ENCOUNTER — Encounter: Payer: Self-pay | Admitting: Hematology

## 2019-01-27 NOTE — Telephone Encounter (Signed)
Scheduled appt per 10/12 los.  Spoke with patient and she is aware of the appt date and time.

## 2019-02-06 ENCOUNTER — Ambulatory Visit: Payer: BC Managed Care – PPO | Admitting: Rehabilitation

## 2019-02-06 ENCOUNTER — Encounter: Payer: Self-pay | Admitting: Rehabilitation

## 2019-02-06 ENCOUNTER — Other Ambulatory Visit: Payer: Self-pay

## 2019-02-06 DIAGNOSIS — C50311 Malignant neoplasm of lower-inner quadrant of right female breast: Secondary | ICD-10-CM | POA: Diagnosis not present

## 2019-02-06 DIAGNOSIS — M25611 Stiffness of right shoulder, not elsewhere classified: Secondary | ICD-10-CM

## 2019-02-06 DIAGNOSIS — Z9011 Acquired absence of right breast and nipple: Secondary | ICD-10-CM

## 2019-02-06 DIAGNOSIS — M62838 Other muscle spasm: Secondary | ICD-10-CM

## 2019-02-06 NOTE — Patient Instructions (Signed)
Access Code: [email or web address]  URL: [email or web address]  Date: 02/06/2019  Prepared by: Shan Levans   Exercises  Supine Shoulder Horizontal Abduction with Resistance - 10 reps - 1-3 sets - 1x daily - 7x weekly  Supine Shoulder External Rotation with Resistance - 10 reps - 1-3 sets - 1x daily - 7x weekly  Supine PNF D2 Flexion with Resistance - 10 reps - 1-3 sets - 1x daily - 7x weekly  Standing Row with Anchored Resistance - 10 reps - 1-3 sets - 1x daily - 7x weekly

## 2019-02-09 NOTE — Therapy (Addendum)
[place], [place], [address] Phone: [phone]   Fax:  [phone]  Physical Therapy Treatment  Patient Details  Name: Taunya Goral MRN: [id number] Date of Birth: 10-Jan-1976 Referring Provider (PT): Dr. Burr Medico   Encounter Date: 02/06/2019    Past Medical History:  Diagnosis Date  . Acid reflux   . Acute bronchitis   . Anemia   . Asthma   . Family history of brain cancer   . Family history of breast cancer   . Family history of prostate cancer   . Migraine     Past Surgical History:  Procedure Laterality Date  . BREAST BIOPSY Right 05/23/2018   x3, malignant  . DILATION AND CURETTAGE OF UTERUS     2006 x1, 2009 x2  . LAPAROTOMY  2008  . PORTACATH PLACEMENT Left 06/06/2018   Procedure: INSERTION PORT-A-CATH POSSIBLE ULTRASOUND;  Surgeon: [name], MD;  Location: [facility];  Service: General;  Laterality: Left;    There were no vitals filed for this visit.          See flowsheets for 02/06/19 treatment as this note was populated on 02/09/19                       PT Long Term Goals - 02/09/19 1738      PT LONG TERM GOAL #1   Title  Pt will improve right shoulder flexion to 170 to equal the opposite side    Baseline  160 on 10/1; 172 on 10/23    Status  Achieved      PT LONG TERM GOAL #2   Title  Pt will improve right shoulder abduction to 168 to equal the opposite side    Baseline  162 on 10/1, 170 on 10/23    Status  Achieved      PT LONG TERM GOAL #3   Title  Pt will receive appropriate compression garment with education on use    Status  Partially Met      PT LONG TERM GOAL #4   Title  Pt will be ind with final HEP for continued mobility and strengthening    Status  Partially Met              Patient will benefit from skilled therapeutic intervention in order to improve the following deficits and impairments:      Visit Diagnosis: Malignant neoplasm of lower-inner quadrant of right breast of female, estrogen receptor positive ([facility])  H/O right mastectomy  Stiffness of right shoulder, not elsewhere classified  Other muscle spasm     Problem List Patient Active Problem List   Diagnosis Date Noted  . Port-A-Cath in place 06/30/2018  . Family history of brain cancer   . Family history of prostate cancer   . Family history of breast cancer   . Cancer of overlapping sites of right female breast ([place]) 05/26/2018  . Postpartum care following vaginal delivery -twins (3/2) 06/17/2014  . Dichorionic diamniotic twin pregnancy 06/15/2014    Stark Bray 02/09/2019, 5:42 PM  [place], [place], [address] Phone: [phone]   Fax:  [phone]  Name: Lamiracle Chaidez MRN: [id number] Date of Birth: 1975-11-06  PHYSICAL THERAPY DISCHARGE SUMMARY  Visits from Start of Care: 5 Current functional level related to goals / functional outcomes: Pt did not return after last scheduled visit but was doing well overall.  Independent with HEP at this point.     Remaining deficits: Need for continued strengthening   Education / Equipment: Has final HEP Plan: Patient agrees to discharge.  Patient goals were partially met. Patient is being discharged due to not returning since the last visit.  ?????     Shan Levans, PT

## 2019-02-11 NOTE — Progress Notes (Signed)
[place]   Telephone:(336) [phone] Fax:(336) [phone]   Clinic Follow up Note   Patient Care Team: [name], MD as PCP - General (Family Medicine) [name], MD as Consulting Physician (General Surgery) [name], MD as Consulting Physician (Hematology) [name], MD as Consulting Physician (Radiation Oncology) [name], MD as Consulting Physician (Obstetrics and Gynecology) [name], RN as Oncology Nurse Navigator [name], RN as Oncology Nurse Navigator  Date of Service:  02/16/2019  CHIEF COMPLAINT: F/u multifocal right breast cancer  SUMMARY OF ONCOLOGIC HISTORY: Oncology History Overview Note  Cancer Staging Cancer of overlapping sites of right female breast [facility]) Staging form: Breast, AJCC 8th Edition - Clinical stage from 05/28/2018: Stage IB (cT2, cN1, cM0, G3, ER+, PR+, HER2+) - Signed by [name], MD on 05/28/2018  Malignant neoplasm of lower-outer quadrant of right breast of female, estrogen receptor positive ([place]) Staging form: Breast, AJCC 8th Edition - Clinical stage from 05/28/2018: Stage IIB (cT2, cN1, cM0, G3, ER+, PR+, HER2-) - Unsigned     Cancer of overlapping sites of right female breast ([place])  05/14/2018 Mammogram   Right breast mammogram 05/14/18  IMPRESSION  There is a 1.0 x 0.5 x 1.2 cm lobular hypoechoic mass right breast 7 o'clock position 3 cm from nipple, a 0.8 x 0.6 x 1.2 cm lobular hypoechoic mass right breast 7 o'clock position 2 cm from the nipple, and a 0.9 x 0.5 x 0.8 cm lobular hypoechoic mass right breast 5 o'clock position 3 cm from nipple.  There is a 3.8 x 1.0 x 1.3 cm lobular hypoechoic mass right breast 5 o'clock position 2 cm from nipple.  Multiple thickened right axillary lymph nodes (approximately 15). Measuring up to 1.0 cm in thickness. Adenopathy corresponds with axillary palpable abnormality.  Indeterminate round and punctate calcifications upper-outer quadrant right  breast.Indeterminate round and punctate calcifications upper-outer quadrant right breast.   05/21/2018 Initial Biopsy   Diagnosis 05/21/18  1. Breast, right, needle core biopsy, axilla, 5 o'clock, ribbon clip - INVASIVE MAMMARY CARCINOMA. - MAMMARY CARCINOMA IN SITU. 2. Breast, right, needle core biopsy, 7 o'clock, coil clip - INVASIVE MAMMARY CARCINOMA. - MAMMARY CARCINOMA IN SITU. 3. Lymph node, needle/core biopsy, right axillary LN, hydromark - METASTATIC MAMMARY CARCINOMA.    05/21/2018 Receptors her2   1. PROGNOSTIC INDICATORS Results: The tumor cells are EQUIVOCAL for Her2 (2+);  HER2 **NEGATIVE** by FISH   Estrogen Receptor: 80%, POSITIVE, MODERATE STAINING INTENSITY Progesterone Receptor: 5%, POSITIVE, MODERATE-WEAK STAINING INTENSITY Proliferation Marker Ki67: 10%  3. PROGNOSTIC INDICATORS Results: The tumor cells are POSITIVE for Her2 (3+). Estrogen Receptor: 90%, POSITIVE, STRONG STAINING INTENSITY Progesterone Receptor: 50%, POSITIVE, MODERATE STAINING INTENSITY Proliferation Marker Ki67: 5%   05/26/2018 Initial Diagnosis   Malignant neoplasm of lower-inner quadrant of right breast of female, estrogen receptor positive ([place])   05/27/2018 Mammogram   Left breast mammogram 05/27/18  IMPRESSION: No mammographic evidence of LEFT breast malignancy.   05/28/2018 Cancer Staging   Staging form: Breast, AJCC 8th Edition - Clinical stage from 05/28/2018: Stage IB (cT2, cN1, cM0, G3, ER+, PR+, HER2+) - Signed by [name], MD on 05/28/2018   06/03/2018 Imaging   MR BREAST BILATERAL W WO CONTRAST INC CAD  IMPRESSION: 1. Large enhancing masses and non mass enhancement identified throughout the majority of the right breast. Additionally, there are smaller masses identified within the upper inner and lower inner right breast. Overall findings are concerning for extensive malignancy throughout all 4 quadrants of  the right breast. 2. Extensive bulky lymphadenopathy identified within  the right axilla. The matted appearance makes counting the number of abnormal lymph nodes difficult as they are immediately approximated to each other. 3. At least one abnormal appearing right internal mammary lymph node. 4. Nonenhancing lesion within the sternum with associated sternal marrow heterogeneity, raising the possibility of osseous metastatic disease within the sternum. 5. Two enhancing masses within the left breast as described above.     06/05/2018 PET scan   NM PET Image Initial (PI) Skull Base To Thigh  IMPRESSION: 1. Multiple borderline enlarged right axillary lymph nodes identified exhibiting mild to moderate increased uptake. Can not rule out nodal metastasis. Correlation with biopsy recommended. 2. Asymmetric increased uptake within the right breast corresponding to MRI abnormality. 3. Subcentimeter right retropectoral and right internal mammary lymph nodes are again identified and exhibit nonspecific, low level FDG uptake. 4. Noncalcified solid nodule within the lateral right lower lobe measures 9 mm without significant radiotracer uptake. 5. No hypermetabolic osseous lesions identified.     06/09/2018 - 11/03/2018 Chemotherapy   Neoadjuvant chemotherapy [facility], 06/09/2018-09/22/18. Followed by Maintenance Herceptin/Perjeta q3weeks starting 10/14/18 to continue 1 year treatment. Stopped Herceptin/Perjeta on 11/03/18 due to multiple Positive LN of pathology.    06/10/2018 Pathology Results   Surgical pathology  Diagnosis 1. Breast, left, needle core biopsy, LOQ, barbell - MILD FIBROCYSTIC CHANGES. 2. Breast, left, needle core biopsy, UIQ cylinder - MILD FIBROCYSTIC CHANGES.    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 There were no deleterious mutations in AIP, ALK, APC, ATM, AXIN2,BAP1,  BARD1,  BLM, BMPR1A, BRCA1, BRCA2, BRIP1, CASR, CDC73, CDH1, CDK4, CDKN1B, CDKN1C, CDKN2A (p14ARF), CDKN2A (p16INK4a), CEBPA, CHEK2, CTNNA1, DICER1, DIS3L2, EGFR (c.2369C>T, p.Thr790Met variant only), EPCAM (Deletion/duplication testing only), FH, FLCN, GATA2, GPC3, GREM1 (Promoter region deletion/duplication testing only), HOXB13 (c.251G>A, p.Gly84Glu), HRAS, KIT, MAX, MEN1, MET, MITF (c.952G>A, p.Glu318Lys variant only), MLH1, MSH2, MSH3, MSH6, MUTYH, NBN, NF1, NF2, NTHL1, PDGFRA, PHOX2B, PMS2, POLD1, POLE, POT1, PRKAR1A, PTCH1, PTEN, RAD50, RAD51C, RAD51D, RB1, RECQL4, RET, RUNX1, SDHAF2, SDHA (sequence changes only), SDHB, SDHC, SDHD, SMAD4, SMARCA4, SMARCB1, SMARCE1, STK11, SUFU, TERC, TERT, TMEM127, TP53, TSC1, TSC2, VHL, WRN and WT1.  .   09/24/2018 Breast MRI   Breast MRI from [facility] on 09/24/18 Right breast: Signal void inferior right breast, posterior depth from biopsy marker clip. Interval decreased size of the right breast, and now symmetric to the left, compared to prior MRI. Resolution of abnormal nonmass enhancement without evidence of residual enhancing mass. No evidence of axillary or internal mammary lymphadenopathy is seen, markedly improved from prior. There is no abnormal skin, nipple, or pectoralis muscle enhancement.  Left breast: There are no suspicious enhancing masses or areas of non-mass enhancement. No evidence of axillary or internal mammary lymphadenopathy is seen. There is no abnormal skin, nipple, or pectoralis muscle enhancement.   10/09/2018 Imaging   CT chest W Contrast 10/09/18  IMPRESSION: 1. Stable 10 mm right lower lobe pulmonary nodule since 06/05/2018. Nodule is not substantially hypermetabolic on the PET-CT. Continued attention on follow-up recommended. 2. No findings to suggest metastatic disease in the thorax.   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 - Biopsy clips identified - Margin status (see extended anterior margin below)  Invasive carcinoma: Positive anterior-inferior margin  Ductal carcinoma in situ: Positive anterior-inferior margin - Ancillary studies previously reported on [id number](5:00, ribbon clip) Estrogen receptor: Positive (80%) Progesterone receptor: Positive (10%) HER2 IHC: Equivocal (2+) HER2 FISH: Negative HER2/CEP17 ratio: 1.35 Mean HER2 copy number: 1.55 - Ancillary studies previously reported on [id number] (right axillary lymph node) Estrogen receptor: Positive (90%) Progesterone receptor: Positive (20%) HER2 IHC: Positive (3+) - Residual Cancer Burden  Tumor bed size: 41 mm x 20 mm Overall tumor cellularity: 10% Percentage in situ: 60% Number of involved lymph nodes: 7 Diameter of largest metastasis: 6 mm Residual Cancer Burden Score: 3.108 Residual Cancer Burden Class: RCB-II - Other findings: Atypical lobular hyperplasia and columnar cell change with microcalcifications; fibroadenomas with microcalcifications  B: Right axilla, targeted lymph node excision - Six of seven lymph nodes positive for carcinoma (6/7) - Size of largest tumor deposit: 6 mm - Biopsy site identified - Treatment effect: Present - Extracapsular extension: Absent  C: Right axilla, lymph node dissection - One of seven lymph nodes positive for carcinoma, macrometastasis, 6 mm (1/7) - Treatment effect: Absent - Extracapsular extension: Absent  D: Right breast, nipple core biopsy - Negative for carcinoma  E: Breast, right, anterior margin, excision - Microscopic foci of ductal carcinoma in situ - Margin: Negative, <1 mm    11/05/2018 Cancer Staging   Staging form: Breast, AJCC 8th Edition -  Pathologic stage from 11/05/2018: No Stage Recommended (ypT1c, pN2a, cM0, G2, ER+, PR+, HER2-) - Signed by [name], MD on 11/23/2018   11/24/2018 -  Chemotherapy   Due to positive LN on surgical pathology her maintance Herceptin/Perjeta was changed to [place] for about 14 cycles on 11/24/18   12/24/2018 - 02/03/2019 Radiation Therapy   adjuvant radiation at [facility] on 12/24/18 and completed on 02/03/19.    01/23/2019 Imaging   CT Chest wo  IMPRESSION: 1. Stable right lower lobe pulmonary nodule. 2. No signs of new or suspicious finding since study of 10/09/2018, now status post right mastectomy, axillary dissection and breast reconstruction.   02/16/2019 -  Anti-estrogen oral therapy   -Monthly Zoladex injection started 12/19/18 -Letrozole 2.5 mg started 02/16/19       CURRENT THERAPY:  -Due to positive LN on surgical pathology her maintenance Herceptin/Perjeta was changed to Kadcyla q3weeks for about 14 cycles on 11/24/18 -Monthly Zoladex injection started 12/19/18 -Letrozole 2.5 mg started 02/16/19   INTERVAL HISTORY:  Kim [name] is here for a follow up and treatment. She presents to the clinic alone. She is doing well.  She completed RT on 02/03/19. She notes she has recovered well. She mostly has skin hyperpigmentation from it. She also feels she has recovered well from chemo. She denies any neuropathy or lymphedema. She did complete PT and has been fitted for compression sleeve.  She notes hot flashes with Zoladex injections. These are manageable so far. She notes having vaginal dryness. She saw her Gynecologist who gave her lubrications to try. She notes she talking to her Gyn about BSO surgery. She notes she plans to have left mastectomy and b/l reconstruction surgery in 06/2019. She notes she still has mild to moderate constipation from Kadcyla and fatigue but manageable.    REVIEW OF SYSTEMS:   Constitutional: Denies  fevers, chills or abnormal weight loss(+)  Manageable hot flashes (+) Fatigue  Eyes: Denies blurriness of vision Ears, nose, mouth, throat, and face: Denies mucositis or sore throat Respiratory: Denies cough, dyspnea or wheezes Cardiovascular: Denies palpitation, chest discomfort or lower extremity swelling Gastrointestinal:  Denies nausea, heartburn (+) Mild to moderate constipation Skin: Denies abnormal skin rashes Lymphatics: Denies new lymphadenopathy or easy bruising Neurological:Denies numbness, tingling or new weaknesses Behavioral/Psych: Mood is stable, no new changes  All other systems were reviewed with the patient and are negative.  MEDICAL HISTORY:  Past Medical History:  Diagnosis Date  . Acid reflux   . Acute bronchitis   . Anemia   . Asthma   . Family history of brain cancer   . Family history of breast cancer   . Family history of prostate cancer   . Migraine     SURGICAL HISTORY: Past Surgical History:  Procedure Laterality Date  . BREAST BIOPSY Right 05/23/2018   x3, malignant  . DILATION AND CURETTAGE OF UTERUS     2006 x1, 2009 x2  . LAPAROTOMY  2008  . PORTACATH PLACEMENT Left 06/06/2018   Procedure: INSERTION PORT-A-CATH POSSIBLE ULTRASOUND;  Surgeon: [name], MD;  Location: [place];  Service: General;  Laterality: Left;    I have reviewed the social history and family history with the patient and they are unchanged from previous note.  ALLERGIES:  has No Known Allergies.  MEDICATIONS:  Current Outpatient Medications  Medication Sig Dispense Refill  . albuterol (PROVENTIL HFA;VENTOLIN HFA) 108 (90 BASE) MCG/ACT inhaler Inhale 2 puffs into the lungs every 6 (six) hours as needed for wheezing or shortness of breath.    . ALPRAZolam (XANAX) 0.5 MG tablet Take 0.25-0.5 mg by mouth at bedtime as needed for anxiety.     Haze Justin Probiotic (BIOGAIA PROBIOTIC) LIQD Take by mouth daily at 8 pm.    . cetirizine (ZYRTEC) 10 MG chewable tablet Chew 10 mg by mouth daily.    .  diphenhydrAMINE (BENADRYL) 25 mg capsule Take 25 mg by mouth at bedtime as needed for sleep.    Marland Kitchen ibuprofen (ADVIL) 200 MG tablet Take 400 mg by mouth every 6 (six) hours as needed for headache or moderate pain.    . Melatonin 3 MG CAPS Take 3 mg by mouth at bedtime.    . Polyethylene Glycol 3350 (MIRALAX PO) Take 17 g by mouth daily.    . propranolol (INDERAL) 10 MG tablet Take 5 mg by mouth daily as needed (for shaking hands).     Marland Kitchen letrozole (FEMARA) 2.5 MG tablet Take 1 tablet (2.5 mg total) by mouth daily. 30 tablet 3   No current facility-administered medications for this visit.    Facility-Administered Medications Ordered in Other Visits  Medication Dose Route Frequency Provider Last Rate Last Dose  . ado-trastuzumab emtansine (KADCYLA) 220 mg in sodium chloride 0.9 % 250 mL chemo infusion  3.6 mg/kg (Treatment Plan Recorded) Intravenous Once [name], MD      . heparin lock flush 100 unit/mL  500 Units Intracatheter Once PRN [name], MD      . sodium chloride flush (NS) 0.9 % injection 10 mL  10 mL Intracatheter PRN [name], MD        PHYSICAL EXAMINATION: ECOG PERFORMANCE STATUS: 0 - Asymptomatic  Vitals:   02/16/19 0821  BP: 100/62  Pulse: 61  Resp: 17  Temp: 98 F (36.7 C)  SpO2: 98%   Filed  Weights   02/16/19 0821  Weight: 137 lb 11.2 oz (62.5 kg)    GENERAL:alert, no distress and comfortable SKIN: skin color, texture, turgor are normal, no rashes or significant lesions EYES: normal, Conjunctiva are pink and non-injected, sclera clear  NECK: supple, thyroid normal size, non-tender, without nodularity LYMPH:  no palpable lymphadenopathy in the cervical, axillary  LUNGS: clear to auscultation and percussion with normal breathing effort HEART: regular rate & rhythm and no murmurs and no lower extremity edema ABDOMEN:abdomen soft, non-tender and normal bowel sounds Musculoskeletal:no cyanosis of digits and no clubbing  NEURO: alert & oriented x 3 with fluent  speech, no focal motor/sensory deficits BREAST: S/p right mastectomy with tissue expanders in place. Surgical incision healed well. (+) Right lateral skin erythema and hyperpigmentation from RT. No palpable mass, nodules or adenopathy bilaterally. Left breast exam benign.   LABORATORY DATA:  I have reviewed the data as listed CBC Latest Ref Rng & Units 02/16/2019 01/26/2019 01/05/2019  WBC 4.0 - 10.5 K/uL 1.7(L) 1.9(L) 4.1  Hemoglobin 12.0 - 15.0 g/dL 10.3(L) 10.9(L) 11.5(L)  Hematocrit 36.0 - 46.0 % 31.6(L) 33.1(L) 35.7(L)  Platelets 150 - 400 K/uL 104(L) 101(L) 150     CMP Latest Ref Rng & Units 02/16/2019 01/26/2019 01/05/2019  Glucose 70 - 99 mg/dL 135(H) 114(H) 118(H)  BUN 6 - 20 mg/dL _0 Creatinine 0.44 - 1.00 mg/dL 0.74 0.68 0.76  Sodium 135 - 145 mmol/L 142 141 141  Potassium 3.5 - 5.1 mmol/L 3.6 4.1 4.2  Chloride 98 - 111 mmol/L 107 105 104  CO2 22 - 32 mmol/L _1 Calcium 8.9 - 10.3 mg/dL 9.1 9.2 9.7  Total Protein 6.5 - 8.1 g/dL 6.8 7.3 7.5  Total Bilirubin 0.3 - 1.2 mg/dL 0.3 0.3 0.3  Alkaline Phos 38 - 126 U/L 82 81 96  AST 15 - 41 U/L 14(L) 17 12(L)  ALT 0 - 44 U/L _2 RADIOGRAPHIC STUDIES: I have personally reviewed the radiological images as listed and agreed with the findings in the report. No results found.   ASSESSMENT & PLAN:  Deadra Diggins is a 43 y.o. 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 [place] also showed 15abnormallymph nodes. -There is discrepancy of her HER2 test results between breast biopsy and axillary lymph node biopsy -she is on adjuvant Kadcyla now due to her high risk disease  -She   adjuvant radiation at [facility] through [facility] 12/24/18 and completed on 02/03/19.  -She plans to doprophylactic left mastectomy and b/l reconstructionsurgeryin 06/2019.  -Given ER/PR positive disease, young age and high risk disease, we recommend antiestrogen therapy along with ovarian suppression.She started ovarian suppression with Zoladex on 12/19/18 as she isstill premenopausal.She would like to have BSO this year with her OBGYN.  -She has completed adjuvant radiation.  Plan to start letrozole this week.  I again reviewed the benefit and side effects from refusal, especially hot flashes, sweating, weight gain, metabolic changes, and osteoporosis, arthralgia, slightly increased risk of cardiovascular disease, etc. she voiced good understanding, and agrees to start.  Medication called into her pharmacy today. -I will check her cholesterol and A1c as baseline at next visit.  -Labs reviewed, CBC  and CMP WNL except WBC 1.7, Hg 10.3, PLT 104K, ANC 1. Overall adequate to proceed with Zoladex injection and full dose Kadcyla today. Will give Udenyca support this week.  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 She has discovered a stronger maternal family history of prostate and breast cancerin her 2nd and 3rd degree relatives. -We discussed the risk of future breast cancer from PALB2m65mtion, which depends on her family history.I thinkit isveryreasonable to proceed with b/l breast mastectomy given her positive family history of cancers. She is agreeable andwill have breastreconstruction. -Wepreviouslyreviewed the risk for other cancers in carriers for PALB2 mutations, including female breast cancer, prostate, ovarian and pancreatic cancer, although these risks have not yet been quantified. While there is an increased risk for other cancers, there are no current guidelines for screening for these cancers, and at this  time NCCN does not suggest a discussion of risk reducing salpingo-oophorectomy. -Her mother, 73,35as recently diagnosed with breast cancertriple negative, probably PALB2 mutation related. She will proceed with b/l mastectomy in 09/2018. -she has talked to GYN about her BSO and plan to proceed  -We discussed the role of hysterectomy.  Although it's not recommended based on her PALB2 gene mutation, it's reasonable to get it done with BSO since her surgical risk is low.   3. Asthma  -mild, continuealbuterol inhaler as needed -I reviewedsheis higherrisk for infection. I discussedinfection precautions and advised herto avoid unnecessary public venues.  -If she develops fever or productive cough with phlegm she can contact our clinic. 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 9mm3mL lung nodule  -Her initial staging PET scan showed a 9 mm solid rounded nodule within the lateral right lower lobe, not hypermetabolic -CT chest from 6/257/12/45wsstable 10mm81mht lung nodule.  -Her attempted lung biopsy was unsuccessful. Will monitor with follow up CT scan in 3 months  -Her CT chest from 01/23/19 shows stable lung nodule, no new or enlarging nodules or concerns. Will continue to monitor for 2 years.     Plan -I called in Letrozole to start this week  -Labs reviewed and adequate to proceed with Kadcyla today, will continue full dose and give Udenyca with this cycle  -Lab and Kadcyla in 3 and 6 weeks  -Zoladex injection today and continue monthly  -F/u in 6 weeks -I spoke with her GYN Dr. FoglePamala Hurryy, and discussed her BSO and hysterectomy.  Due to her cytopenias from [place] Fogel[place]ent[place] Sax[place] do her surgery next year after she completes Kadcyla    No  problem-specific Assessment & Plan notes found for this encounter.   No orders of the defined types were placed in this encounter.  All questions were answered. The patient knows to call the clinic with any problems, questions or concerns. No barriers to learning was detected. I spent 25 minutes counseling the patient face to face. The total time spent in the appointment was 40 minutes and more than 50% was on counseling and review of test results     Johnisha Louks FTruitt Merle11/05/2018   I, AmoyaJoslyn Devonacting as scribe for Adric Wrede FTruitt Merle   I have reviewed the above documentation for accuracy and completeness, and I agree with the above.

## 2019-02-13 ENCOUNTER — Encounter: Payer: BC Managed Care – PPO | Admitting: Rehabilitation

## 2019-02-16 ENCOUNTER — Other Ambulatory Visit: Payer: BC Managed Care – PPO

## 2019-02-16 ENCOUNTER — Inpatient Hospital Stay: Payer: BC Managed Care – PPO

## 2019-02-16 ENCOUNTER — Telehealth: Payer: Self-pay | Admitting: Hematology

## 2019-02-16 ENCOUNTER — Encounter: Payer: Self-pay | Admitting: Hematology

## 2019-02-16 ENCOUNTER — Other Ambulatory Visit: Payer: Self-pay

## 2019-02-16 ENCOUNTER — Inpatient Hospital Stay (HOSPITAL_BASED_OUTPATIENT_CLINIC_OR_DEPARTMENT_OTHER): Payer: BC Managed Care – PPO | Admitting: Hematology

## 2019-02-16 ENCOUNTER — Inpatient Hospital Stay: Payer: BC Managed Care – PPO | Attending: Hematology

## 2019-02-16 VITALS — BP 99/67 | HR 52

## 2019-02-16 VITALS — BP 100/62 | HR 61 | Temp 98.0°F | Resp 17 | Ht 69.0 in | Wt 137.7 lb

## 2019-02-16 DIAGNOSIS — Z5189 Encounter for other specified aftercare: Secondary | ICD-10-CM | POA: Insufficient documentation

## 2019-02-16 DIAGNOSIS — Z5111 Encounter for antineoplastic chemotherapy: Secondary | ICD-10-CM | POA: Diagnosis present

## 2019-02-16 DIAGNOSIS — J45909 Unspecified asthma, uncomplicated: Secondary | ICD-10-CM | POA: Diagnosis not present

## 2019-02-16 DIAGNOSIS — C50811 Malignant neoplasm of overlapping sites of right female breast: Secondary | ICD-10-CM

## 2019-02-16 DIAGNOSIS — Z170 Estrogen receptor positive status [ER+]: Secondary | ICD-10-CM

## 2019-02-16 DIAGNOSIS — Z13220 Encounter for screening for lipoid disorders: Secondary | ICD-10-CM

## 2019-02-16 DIAGNOSIS — Z5112 Encounter for antineoplastic immunotherapy: Secondary | ICD-10-CM | POA: Diagnosis not present

## 2019-02-16 DIAGNOSIS — Z95828 Presence of other vascular implants and grafts: Secondary | ICD-10-CM

## 2019-02-16 DIAGNOSIS — R911 Solitary pulmonary nodule: Secondary | ICD-10-CM | POA: Diagnosis not present

## 2019-02-16 DIAGNOSIS — D61810 Antineoplastic chemotherapy induced pancytopenia: Secondary | ICD-10-CM | POA: Diagnosis not present

## 2019-02-16 LAB — CMP (CANCER CENTER ONLY)
ALT: 25 U/L (ref 0–44)
AST: 14 U/L — ABNORMAL LOW (ref 15–41)
Albumin: 3.9 g/dL (ref 3.5–5.0)
Alkaline Phosphatase: 82 U/L (ref 38–126)
Anion gap: 9 (ref 5–15)
BUN: 13 mg/dL (ref 6–20)
CO2: 26 mmol/L (ref 22–32)
Calcium: 9.1 mg/dL (ref 8.9–10.3)
Chloride: 107 mmol/L (ref 98–111)
Creatinine: 0.74 mg/dL (ref 0.44–1.00)
GFR, Est AFR Am: >60 mL/min (ref >60)
GFR, Estimated: >60 mL/min (ref >60)
Glucose, Bld: 135 mg/dL — ABNORMAL HIGH (ref 70–99)
Potassium: 3.6 mmol/L (ref 3.5–5.1)
Sodium: 142 mmol/L (ref 135–145)
Total Bilirubin: 0.3 mg/dL (ref 0.3–1.2)
Total Protein: 6.8 g/dL (ref 6.5–8.1)

## 2019-02-16 LAB — CBC WITH DIFFERENTIAL (CANCER CENTER ONLY)
Abs Immature Granulocytes: 0 10*3/uL (ref 0.00–0.07)
Basophils Absolute: 0 10*3/uL (ref 0.0–0.1)
Basophils Relative: 1 %
Eosinophils Absolute: 0.1 10*3/uL (ref 0.0–0.5)
Eosinophils Relative: 7 %
HCT: 31.6 % — ABNORMAL LOW (ref 36.0–46.0)
Hemoglobin: 10.3 g/dL — ABNORMAL LOW (ref 12.0–15.0)
Immature Granulocytes: 0 %
Lymphocytes Relative: 25 %
Lymphs Abs: 0.4 10*3/uL — ABNORMAL LOW (ref 0.7–4.0)
MCH: 29.9 pg (ref 26.0–34.0)
MCHC: 32.6 g/dL (ref 30.0–36.0)
MCV: 91.9 fL (ref 80.0–100.0)
Monocytes Absolute: 0.2 10*3/uL (ref 0.1–1.0)
Monocytes Relative: 11 %
Neutro Abs: 1 10*3/uL — ABNORMAL LOW (ref 1.7–7.7)
Neutrophils Relative %: 56 %
Platelet Count: 104 10*3/uL — ABNORMAL LOW (ref 150–400)
RBC: 3.44 MIL/uL — ABNORMAL LOW (ref 3.87–5.11)
RDW: 14.9 % (ref 11.5–15.5)
WBC Count: 1.7 10*3/uL — ABNORMAL LOW (ref 4.0–10.5)
nRBC: 0 % (ref 0.0–0.2)

## 2019-02-16 MED ORDER — LETROZOLE 2.5 MG PO TABS: 2.5000 mg | ORAL_TABLET | Freq: Every day | ORAL | 3 refills | Status: DC

## 2019-02-16 MED ORDER — DIPHENHYDRAMINE HCL 25 MG PO CAPS
ORAL_CAPSULE | ORAL | Status: AC
  Filled 2019-02-16: qty 2

## 2019-02-16 MED ORDER — HEPARIN SOD (PORK) LOCK FLUSH 100 UNIT/ML IV SOLN
500.0000 [IU] | Freq: Once | INTRAVENOUS | Status: AC | PRN
  Administered 2019-02-16: 12:00:00 500 [IU]
  Filled 2019-02-16: qty 5

## 2019-02-16 MED ORDER — SODIUM CHLORIDE 0.9 % IV SOLN
Freq: Once | INTRAVENOUS | Status: AC
  Administered 2019-02-16: 09:00:00 via INTRAVENOUS
  Filled 2019-02-16: qty 250

## 2019-02-16 MED ORDER — SODIUM CHLORIDE 0.9 % IV SOLN
3.6000 mg/kg | Freq: Once | INTRAVENOUS | Status: AC
  Administered 2019-02-16: 220 mg via INTRAVENOUS
  Filled 2019-02-16: qty 8

## 2019-02-16 MED ORDER — SODIUM CHLORIDE 0.9% FLUSH
10.0000 mL | INTRAVENOUS | Status: DC | PRN
  Administered 2019-02-16: 12:00:00 10 mL
  Filled 2019-02-16: qty 10

## 2019-02-16 MED ORDER — SODIUM CHLORIDE 0.9% FLUSH
10.0000 mL | INTRAVENOUS | Status: DC | PRN
  Administered 2019-02-16: 10 mL
  Filled 2019-02-16: qty 10

## 2019-02-16 MED ORDER — DIPHENHYDRAMINE HCL 25 MG PO CAPS
50.0000 mg | ORAL_CAPSULE | Freq: Once | ORAL | Status: AC
  Administered 2019-02-16: 50 mg via ORAL

## 2019-02-16 MED ORDER — GOSERELIN ACETATE 3.6 MG ~~LOC~~ IMPL
3.6000 mg | DRUG_IMPLANT | Freq: Once | SUBCUTANEOUS | Status: AC
  Administered 2019-02-16: 3.6 mg via SUBCUTANEOUS

## 2019-02-16 MED ORDER — ACETAMINOPHEN 325 MG PO TABS
650.0000 mg | ORAL_TABLET | Freq: Once | ORAL | Status: AC
  Administered 2019-02-16: 10:00:00 650 mg via ORAL

## 2019-02-16 MED ORDER — ACETAMINOPHEN 325 MG PO TABS
ORAL_TABLET | ORAL | Status: AC
  Filled 2019-02-16: qty 2

## 2019-02-16 MED ORDER — GOSERELIN ACETATE 3.6 MG ~~LOC~~ IMPL
DRUG_IMPLANT | SUBCUTANEOUS | Status: AC
  Filled 2019-02-16: qty 3.6

## 2019-02-16 NOTE — Patient Instructions (Addendum)
[facility] Discharge Instructions for Patients Receiving Chemotherapy  Today you received the following chemotherapy agents: Kadcyla   To help prevent nausea and vomiting after your treatment, we encourage you to take your nausea medication as directed.    If you develop nausea and vomiting that is not controlled by your nausea medication, call the clinic.   BELOW ARE SYMPTOMS THAT SHOULD BE REPORTED IMMEDIATELY:  *FEVER GREATER THAN 100.5 F  *CHILLS WITH OR WITHOUT FEVER  NAUSEA AND VOMITING THAT IS NOT CONTROLLED WITH YOUR NAUSEA MEDICATION  *UNUSUAL SHORTNESS OF BREATH  *UNUSUAL BRUISING OR BLEEDING  TENDERNESS IN MOUTH AND THROAT WITH OR WITHOUT PRESENCE OF ULCERS  *URINARY PROBLEMS  *BOWEL PROBLEMS  UNUSUAL RASH Items with * indicate a potential emergency and should be followed up as soon as possible.  Feel free to call the clinic should you have any questions or concerns. The clinic phone number is [phone].  Please show the [facility] at check-in to the Emergency Department and triage nurse.  Fulvestrant injection What is this medicine? FULVESTRANT (ful VES trant) blocks the effects of estrogen. It is used to treat breast cancer. This medicine may be used for other purposes; ask your health care provider or pharmacist if you have questions. COMMON BRAND NAME(S): FASLODEX What should I tell my health care provider before I take this medicine? They need to know if you have any of these conditions:  bleeding disorders  liver disease  low blood counts, like low white cell, platelet, or red cell counts  an unusual or allergic reaction to fulvestrant, other medicines, foods, dyes, or preservatives  pregnant or trying to get pregnant  breast-feeding How should I use this medicine? This medicine is for injection into a muscle. It is usually given by a health care professional in a hospital or clinic setting. Talk to your pediatrician  regarding the use of this medicine in children. Special care may be needed. Overdosage: If you think you have taken too much of this medicine contact a poison control center or emergency room at once. NOTE: This medicine is only for you. Do not share this medicine with others. What if I miss a dose? It is important not to miss your dose. Call your doctor or health care professional if you are unable to keep an appointment. What may interact with this medicine?  medicines that treat or prevent blood clots like warfarin, enoxaparin, dalteparin, apixaban, dabigatran, and rivaroxaban This list may not describe all possible interactions. Give your health care provider a list of all the medicines, herbs, non-prescription drugs, or dietary supplements you use. Also tell them if you smoke, drink alcohol, or use illegal drugs. Some items may interact with your medicine. What should I watch for while using this medicine? Your condition will be monitored carefully while you are receiving this medicine. You will need important blood work done while you are taking this medicine. Do not become pregnant while taking this medicine or for at least 1 year after stopping it. Women of child-bearing potential will need to have a negative pregnancy test before starting this medicine. Women should inform their doctor if they wish to become pregnant or think they might be pregnant. There is a potential for serious side effects to an unborn child. Men should inform their doctors if they wish to father a child. This medicine may lower sperm counts. Talk to your health care professional or pharmacist for more information. Do not breast-feed an infant  while taking this medicine or for 1 year after the last dose. What side effects may I notice from receiving this medicine? Side effects that you should report to your doctor or health care professional as soon as possible:  allergic reactions like skin rash, itching or hives,  swelling of the face, lips, or tongue  feeling faint or lightheaded, falls  pain, tingling, numbness, or weakness in the legs  signs and symptoms of infection like fever or chills; cough; flu-like symptoms; sore throat  vaginal bleeding Side effects that usually do not require medical attention (report to your doctor or health care professional if they continue or are bothersome):  aches, pains  constipation  diarrhea  headache  hot flashes  nausea, vomiting  pain at site where injected  stomach pain This list may not describe all possible side effects. Call your doctor for medical advice about side effects. You may report side effects to FDA at 1-800-FDA-1088. Where should I keep my medicine? This drug is given in a hospital or clinic and will not be stored at home. NOTE: This sheet is a summary. It may not cover all possible information. If you have questions about this medicine, talk to your doctor, pharmacist, or health care provider.  2020 Elsevier/Gold Standard (2017-07-11 11:34:41)

## 2019-02-16 NOTE — Telephone Encounter (Signed)
Scheduled zoladex inj per 11/2 sch message - pt aware of appt date and time

## 2019-02-16 NOTE — Telephone Encounter (Signed)
Returned patient's phone call regarding rescheduling an appointment, per patient's request 11/04 appointment time has been rescheduled.

## 2019-02-16 NOTE — Telephone Encounter (Signed)
Scheduled appt per 11/2 los.  Patient will get an updates calendar after treatment.

## 2019-02-16 NOTE — Progress Notes (Signed)
Per Dr. Burr Medico okay to treat with ANC 1.0, patient will received Undenyca on Wednesday.

## 2019-02-16 NOTE — Patient Instructions (Signed)

## 2019-02-17 NOTE — Addendum Note (Signed)
Addended by: [name] on: 02/17/2019 09:53 AM   Modules accepted: Orders

## 2019-02-18 ENCOUNTER — Ambulatory Visit: Payer: BC Managed Care – PPO

## 2019-02-20 ENCOUNTER — Other Ambulatory Visit: Payer: Self-pay

## 2019-02-20 ENCOUNTER — Ambulatory Visit: Payer: BC Managed Care – PPO | Admitting: Rehabilitation

## 2019-02-20 ENCOUNTER — Inpatient Hospital Stay: Payer: BC Managed Care – PPO

## 2019-02-20 VITALS — BP 95/69 | HR 50 | Temp 98.5°F | Resp 18

## 2019-02-20 DIAGNOSIS — Z95828 Presence of other vascular implants and grafts: Secondary | ICD-10-CM

## 2019-02-20 DIAGNOSIS — Z5112 Encounter for antineoplastic immunotherapy: Secondary | ICD-10-CM | POA: Diagnosis not present

## 2019-02-20 MED ORDER — PEGFILGRASTIM-CBQV 6 MG/0.6ML ~~LOC~~ SOSY
6.0000 mg | PREFILLED_SYRINGE | Freq: Once | SUBCUTANEOUS | Status: AC
  Administered 2019-02-20: 6 mg via SUBCUTANEOUS

## 2019-02-20 MED ORDER — PEGFILGRASTIM-CBQV 6 MG/0.6ML ~~LOC~~ SOSY
PREFILLED_SYRINGE | SUBCUTANEOUS | Status: AC
  Filled 2019-02-20: qty 0.6

## 2019-02-20 NOTE — Addendum Note (Signed)
Addended by: [name] on: 02/20/2019 12:44 PM   Modules accepted: Orders

## 2019-03-01 ENCOUNTER — Encounter: Payer: Self-pay | Admitting: Hematology

## 2019-03-06 ENCOUNTER — Ambulatory Visit: Payer: BC Managed Care – PPO | Admitting: Rehabilitation

## 2019-03-09 ENCOUNTER — Other Ambulatory Visit: Payer: Self-pay

## 2019-03-09 ENCOUNTER — Inpatient Hospital Stay: Payer: BC Managed Care – PPO

## 2019-03-09 ENCOUNTER — Other Ambulatory Visit: Payer: BC Managed Care – PPO

## 2019-03-09 ENCOUNTER — Telehealth: Payer: Self-pay

## 2019-03-09 ENCOUNTER — Ambulatory Visit: Payer: BC Managed Care – PPO | Admitting: Hematology

## 2019-03-09 VITALS — BP 109/69 | HR 52 | Temp 98.4°F | Resp 18 | Wt 137.8 lb

## 2019-03-09 DIAGNOSIS — Z5112 Encounter for antineoplastic immunotherapy: Secondary | ICD-10-CM | POA: Diagnosis not present

## 2019-03-09 DIAGNOSIS — Z170 Estrogen receptor positive status [ER+]: Secondary | ICD-10-CM

## 2019-03-09 DIAGNOSIS — Z13220 Encounter for screening for lipoid disorders: Secondary | ICD-10-CM

## 2019-03-09 DIAGNOSIS — C50811 Malignant neoplasm of overlapping sites of right female breast: Secondary | ICD-10-CM

## 2019-03-09 DIAGNOSIS — Z95828 Presence of other vascular implants and grafts: Secondary | ICD-10-CM

## 2019-03-09 LAB — CBC WITH DIFFERENTIAL (CANCER CENTER ONLY)
Abs Immature Granulocytes: 0.01 10*3/uL (ref 0.00–0.07)
Basophils Absolute: 0 10*3/uL (ref 0.0–0.1)
Basophils Relative: 1 %
Eosinophils Absolute: 0.1 10*3/uL (ref 0.0–0.5)
Eosinophils Relative: 2 %
HCT: 33.5 % — ABNORMAL LOW (ref 36.0–46.0)
Hemoglobin: 10.7 g/dL — ABNORMAL LOW (ref 12.0–15.0)
Immature Granulocytes: 0 %
Lymphocytes Relative: 19 %
Lymphs Abs: 0.6 10*3/uL — ABNORMAL LOW (ref 0.7–4.0)
MCH: 30.2 pg (ref 26.0–34.0)
MCHC: 31.9 g/dL (ref 30.0–36.0)
MCV: 94.6 fL (ref 80.0–100.0)
Monocytes Absolute: 0.4 10*3/uL (ref 0.1–1.0)
Monocytes Relative: 14 %
Neutro Abs: 2 10*3/uL (ref 1.7–7.7)
Neutrophils Relative %: 64 %
Platelet Count: 161 10*3/uL (ref 150–400)
RBC: 3.54 MIL/uL — ABNORMAL LOW (ref 3.87–5.11)
RDW: 15.6 % — ABNORMAL HIGH (ref 11.5–15.5)
WBC Count: 3.1 10*3/uL — ABNORMAL LOW (ref 4.0–10.5)
nRBC: 0 % (ref 0.0–0.2)

## 2019-03-09 LAB — CMP (CANCER CENTER ONLY)
ALT: 25 U/L (ref 0–44)
AST: 15 U/L (ref 15–41)
Albumin: 4.2 g/dL (ref 3.5–5.0)
Alkaline Phosphatase: 108 U/L (ref 38–126)
Anion gap: 9 (ref 5–15)
BUN: 11 mg/dL (ref 6–20)
CO2: 29 mmol/L (ref 22–32)
Calcium: 9.4 mg/dL (ref 8.9–10.3)
Chloride: 104 mmol/L (ref 98–111)
Creatinine: 0.72 mg/dL (ref 0.44–1.00)
GFR, Est AFR Am: >60 mL/min (ref >60)
GFR, Estimated: >60 mL/min (ref >60)
Glucose, Bld: 88 mg/dL (ref 70–99)
Potassium: 4.1 mmol/L (ref 3.5–5.1)
Sodium: 142 mmol/L (ref 135–145)
Total Bilirubin: 0.4 mg/dL (ref 0.3–1.2)
Total Protein: 7.3 g/dL (ref 6.5–8.1)

## 2019-03-09 LAB — LIPID PANEL
Cholesterol: 142 mg/dL (ref 0–200)
HDL: 49 mg/dL (ref >40)
LDL Cholesterol: 80 mg/dL (ref 0–99)
Total CHOL/HDL Ratio: 2.9 RATIO
Triglycerides: 66 mg/dL (ref <150)
VLDL: 13 mg/dL (ref 0–40)

## 2019-03-09 MED ORDER — SODIUM CHLORIDE 0.9% FLUSH
10.0000 mL | INTRAVENOUS | Status: DC | PRN
  Administered 2019-03-09: 10 mL
  Filled 2019-03-09: qty 10

## 2019-03-09 MED ORDER — SODIUM CHLORIDE 0.9 % IV SOLN
3.6000 mg/kg | Freq: Once | INTRAVENOUS | Status: AC
  Administered 2019-03-09: 220 mg via INTRAVENOUS
  Filled 2019-03-09: qty 8

## 2019-03-09 MED ORDER — DIPHENHYDRAMINE HCL 25 MG PO CAPS
ORAL_CAPSULE | ORAL | Status: AC
  Filled 2019-03-09: qty 1

## 2019-03-09 MED ORDER — ACETAMINOPHEN 325 MG PO TABS
ORAL_TABLET | ORAL | Status: AC
  Filled 2019-03-09: qty 2

## 2019-03-09 MED ORDER — DIPHENHYDRAMINE HCL 25 MG PO CAPS
50.0000 mg | ORAL_CAPSULE | Freq: Once | ORAL | Status: AC
  Administered 2019-03-09: 11:00:00 50 mg via ORAL

## 2019-03-09 MED ORDER — HEPARIN SOD (PORK) LOCK FLUSH 100 UNIT/ML IV SOLN
500.0000 [IU] | Freq: Once | INTRAVENOUS | Status: AC | PRN
  Administered 2019-03-09: 500 [IU]
  Filled 2019-03-09: qty 5

## 2019-03-09 MED ORDER — ACETAMINOPHEN 325 MG PO TABS
650.0000 mg | ORAL_TABLET | Freq: Once | ORAL | Status: AC
  Administered 2019-03-09: 11:00:00 650 mg via ORAL

## 2019-03-09 MED ORDER — SODIUM CHLORIDE 0.9 % IV SOLN
Freq: Once | INTRAVENOUS | Status: AC
  Administered 2019-03-09: 11:00:00 via INTRAVENOUS
  Filled 2019-03-09: qty 250

## 2019-03-09 MED ORDER — SODIUM CHLORIDE 0.9% FLUSH
10.0000 mL | INTRAVENOUS | Status: DC | PRN
  Administered 2019-03-09: 12:00:00 10 mL
  Filled 2019-03-09: qty 10

## 2019-03-09 NOTE — Patient Instructions (Signed)
[facility] Discharge Instructions for Patients Receiving Chemotherapy  Today you received the following chemotherapy agents Kadcyla  To help prevent nausea and vomiting after your treatment, we encourage you to take your nausea medication as directed   If you develop nausea and vomiting that is not controlled by your nausea medication, call the clinic.   BELOW ARE SYMPTOMS THAT SHOULD BE REPORTED IMMEDIATELY:  *FEVER GREATER THAN 100.5 F  *CHILLS WITH OR WITHOUT FEVER  NAUSEA AND VOMITING THAT IS NOT CONTROLLED WITH YOUR NAUSEA MEDICATION  *UNUSUAL SHORTNESS OF BREATH  *UNUSUAL BRUISING OR BLEEDING  TENDERNESS IN MOUTH AND THROAT WITH OR WITHOUT PRESENCE OF ULCERS  *URINARY PROBLEMS  *BOWEL PROBLEMS  UNUSUAL RASH Items with * indicate a potential emergency and should be followed up as soon as possible.  Feel free to call the clinic should you have any questions or concerns. The clinic phone number is [phone].  Please show the CHEMO ALERT CARD at check-in to the Emergency Department and triage nurse.   

## 2019-03-09 NOTE — Telephone Encounter (Signed)
Faxed signed order for compression garment to [facility], received confirmation this went through, sent to HIM for scan to chart.

## 2019-03-16 ENCOUNTER — Inpatient Hospital Stay: Payer: BC Managed Care – PPO

## 2019-03-16 ENCOUNTER — Other Ambulatory Visit: Payer: Self-pay

## 2019-03-16 VITALS — BP 96/52 | HR 51 | Temp 98.0°F | Resp 18

## 2019-03-16 DIAGNOSIS — Z95828 Presence of other vascular implants and grafts: Secondary | ICD-10-CM

## 2019-03-16 DIAGNOSIS — Z5112 Encounter for antineoplastic immunotherapy: Secondary | ICD-10-CM | POA: Diagnosis not present

## 2019-03-16 MED ORDER — GOSERELIN ACETATE 3.6 MG ~~LOC~~ IMPL
3.6000 mg | DRUG_IMPLANT | Freq: Once | SUBCUTANEOUS | Status: AC
  Administered 2019-03-16: 3.6 mg via SUBCUTANEOUS

## 2019-03-16 MED ORDER — PEGFILGRASTIM-CBQV 6 MG/0.6ML ~~LOC~~ SOSY: 6.0000 mg | PREFILLED_SYRINGE | Freq: Once | SUBCUTANEOUS | Status: DC

## 2019-03-16 MED ORDER — GOSERELIN ACETATE 3.6 MG ~~LOC~~ IMPL
DRUG_IMPLANT | SUBCUTANEOUS | Status: AC
  Filled 2019-03-16: qty 3.6

## 2019-03-16 NOTE — Patient Instructions (Signed)

## 2019-03-16 NOTE — Progress Notes (Signed)
Spoke with Dr. Burr Medico and confirmed that since patient's [place] was 2.0 on 03/09/19, she will not receive Udencya today. Per MD patient is to receive Zoladex today. Orders updated to reflect changes.  Leron Croak, PharmD, BCPS PGY2 Hematology/Oncology Pharmacy Resident 03/16/2019 11:12 AM

## 2019-03-18 ENCOUNTER — Other Ambulatory Visit: Payer: Self-pay

## 2019-03-18 ENCOUNTER — Ambulatory Visit (HOSPITAL_COMMUNITY)
Admission: RE | Admit: 2019-03-18 | Discharge: 2019-03-18 | Disposition: A | Discharge status: Home / Self Care | Payer: BC Managed Care – PPO | Source: Ambulatory Visit | Attending: Internal Medicine | Admitting: Internal Medicine

## 2019-03-18 ENCOUNTER — Encounter (HOSPITAL_COMMUNITY): Payer: Self-pay | Admitting: Internal Medicine

## 2019-03-18 ENCOUNTER — Ambulatory Visit (HOSPITAL_BASED_OUTPATIENT_CLINIC_OR_DEPARTMENT_OTHER)
Admission: RE | Admit: 2019-03-18 | Discharge: 2019-03-18 | Disposition: A | Discharge status: Home / Self Care | Payer: BC Managed Care – PPO | Source: Ambulatory Visit | Attending: Internal Medicine | Admitting: Internal Medicine

## 2019-03-18 VITALS — BP 99/63 | HR 55 | Wt 138.6 lb

## 2019-03-18 DIAGNOSIS — R001 Bradycardia, unspecified: Secondary | ICD-10-CM | POA: Insufficient documentation

## 2019-03-18 DIAGNOSIS — K219 Gastro-esophageal reflux disease without esophagitis: Secondary | ICD-10-CM | POA: Diagnosis not present

## 2019-03-18 DIAGNOSIS — C50311 Malignant neoplasm of lower-inner quadrant of right female breast: Secondary | ICD-10-CM | POA: Diagnosis not present

## 2019-03-18 DIAGNOSIS — Z170 Estrogen receptor positive status [ER+]: Secondary | ICD-10-CM | POA: Diagnosis not present

## 2019-03-18 DIAGNOSIS — Z803 Family history of malignant neoplasm of breast: Secondary | ICD-10-CM | POA: Insufficient documentation

## 2019-03-18 DIAGNOSIS — Z5111 Encounter for antineoplastic chemotherapy: Secondary | ICD-10-CM | POA: Diagnosis not present

## 2019-03-18 DIAGNOSIS — J45909 Unspecified asthma, uncomplicated: Secondary | ICD-10-CM | POA: Insufficient documentation

## 2019-03-18 DIAGNOSIS — Z79899 Other long term (current) drug therapy: Secondary | ICD-10-CM | POA: Insufficient documentation

## 2019-03-18 DIAGNOSIS — C50511 Malignant neoplasm of lower-outer quadrant of right female breast: Secondary | ICD-10-CM | POA: Insufficient documentation

## 2019-03-18 DIAGNOSIS — C50811 Malignant neoplasm of overlapping sites of right female breast: Secondary | ICD-10-CM | POA: Insufficient documentation

## 2019-03-18 NOTE — Progress Notes (Signed)
  Echocardiogram 2D Echocardiogram has been performed.  Kim [name] 03/18/2019, 8:56 AM

## 2019-03-18 NOTE — Patient Instructions (Signed)
Your physician recommends that you schedule a follow-up appointment in: 3 months with echocardiogram  

## 2019-03-18 NOTE — Progress Notes (Signed)
Cardio-Oncology TeleHealth Note  Due to national recommendations of social distancing due to [place] 19, Audio/video telehealth visit is felt to be most appropriate for this patient at this time.  See MyChart message from today for patient consent regarding telehealth for [facility].  Date:  03/18/2019   ID:  Renie Stelmach, DOB Oct 24, 1975, MRN [id number]  Location: Home  Provider location: [facility] Type of Visit: New patient  PCP:  [name], MD  Cardiologist:  No primary care provider on file. Primary HF: Gentry Pilson  Chief Complaint:  Breast cancer   History of Present Illness:  Ms. Mclaurin is a 43 y/o female with h/o GERD, asthma and right breast CA referred by Dr. Annamaria Boots for enrollment in the Cardio-oncology clinic for echo surveillance during chemotherapy.    Cancer Staging Cancer of overlapping sites of right female breast [facility]) Staging form: Breast, AJCC 8th Edition - Clinical stage from 05/28/2018: Stage IB (cT2, cN1, cM0, G3, ER+, PR+, HER2+) - Signed by [name], MD on 05/28/2018  Malignant neoplasm of lower-outer quadrant of right breast of female, estrogen receptor positive ([place]) Staging form: Breast, AJCC 8th Edition - Clinical stage from 05/28/2018: Stage IIB (cT2, cN1, cM0, G3, ER+, PR+, HER2-) - Unsigned       Cancer of overlapping sites of right female breast ([place])   05/14/2018 Mammogram    Right breast mammogram 05/14/18  IMPRESSION  There is a 1.0 x 0.5 x 1.2 cm lobular hypoechoic mass right breast 7 o'clock position 3 cm from nipple, a 0.8 x 0.6 x 1.2 cm lobular hypoechoic mass right breast 7 o'clock position 2 cm from the nipple, and a 0.9 x 0.5 x 0.8 cm lobular hypoechoic mass right breast 5 o'clock position 3 cm from nipple.  There is a 3.8 x 1.0 x 1.3 cm lobular hypoechoic mass right breast 5 o'clock position 2 cm from nipple.  Multiple thickened right axillary lymph nodes (approximately 15). Measuring  up to 1.0 cm in thickness. Adenopathy corresponds with axillary palpable abnormality.  Indeterminate round and punctate calcifications upper-outer quadrant right breast.Indeterminate round and punctate calcifications upper-outer quadrant right breast.    05/21/2018 Initial Biopsy    Diagnosis 05/21/18  1. Breast, right, needle core biopsy, axilla, 5 o'clock, ribbon clip - INVASIVE MAMMARY CARCINOMA. - MAMMARY CARCINOMA IN SITU. 2. Breast, right, needle core biopsy, 7 o'clock, coil clip - INVASIVE MAMMARY CARCINOMA. - MAMMARY CARCINOMA IN SITU. 3. Lymph node, needle/core biopsy, right axillary LN, hydromark - METASTATIC MAMMARY CARCINOMA.     05/21/2018 Receptors her2    1. PROGNOSTIC INDICATORS Results: The tumor cells are EQUIVOCAL for Her2 (2+);  HER2 **NEGATIVE** by FISH   Estrogen Receptor: 80%, POSITIVE, MODERATE STAINING INTENSITY Progesterone Receptor: 5%, POSITIVE, MODERATE-WEAK STAINING INTENSITY Proliferation Marker Ki67: 10%  3. PROGNOSTIC INDICATORS Results: The tumor cells are POSITIVE for Her2 (3+). Estrogen Receptor: 90%, POSITIVE, STRONG STAINING INTENSITY Progesterone Receptor: 50%, POSITIVE, MODERATE STAINING INTENSITY Proliferation Marker Ki67: 5%    05/26/2018 Initial Diagnosis    Malignant neoplasm of lower-inner quadrant of right breast of female, estrogen receptor positive ([place])    05/27/2018 Mammogram    Left breast mammogram 05/27/18  IMPRESSION: No mammographic evidence of LEFT breast malignancy.    05/28/2018 Cancer Staging    Staging form: Breast, AJCC 8th Edition - Clinical stage from 05/28/2018: Stage IB (cT2, cN1, cM0, G3, ER+, PR+, HER2+) - Signed by [name], MD on 05/28/2018    06/03/2018 Imaging  MR BREAST BILATERAL W WO CONTRAST INC CAD  IMPRESSION: 1. Large enhancing masses and non mass enhancement identified throughout the majority of the right breast. Additionally, there are smaller masses identified within  the upper inner and lower inner right breast. Overall findings are concerning for extensive malignancy throughout all 4 quadrants of the right breast. 2. Extensive bulky lymphadenopathy identified within the right axilla. The matted appearance makes counting the number of abnormal lymph nodes difficult as they are immediately approximated to each other. 3. At least one abnormal appearing right internal mammary lymph node. 4. Nonenhancing lesion within the sternum with associated sternal marrow heterogeneity, raising the possibility of osseous metastatic disease within the sternum. 5. Two enhancing masses within the left breast as described above.      06/05/2018 PET scan    NM PET Image Initial (PI) Skull Base To Thigh  IMPRESSION: 1. Multiple borderline enlarged right axillary lymph nodes identified exhibiting mild to moderate increased uptake. Can not rule out nodal metastasis. Correlation with biopsy recommended. 2. Asymmetric increased uptake within the right breast corresponding to MRI abnormality. 3. Subcentimeter right retropectoral and right internal mammary lymph nodes are again identified and exhibit nonspecific, low level FDG uptake. 4. Noncalcified solid nodule within the lateral right lower lobe measures 9 mm without significant radiotracer uptake. 5. No hypermetabolic osseous lesions identified.      06/09/2018 -  Chemotherapy    Neoadjuvant chemo TCHP every 3 weeks     06/10/2018 Pathology Results    Surgical pathology  Diagnosis 1. Breast, left, needle core biopsy, LOQ, barbell - MILD FIBROCYSTIC CHANGES. 2. Breast, left, needle core biopsy, UIQ cylinder - MILD FIBROCYSTIC CHANGES.     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 There were no deleterious mutationsinAIP,  ALK, APC, ATM, AXIN2,BAP1, BARD1, BLM, BMPR1A, BRCA1, BRCA2, BRIP1, CASR, CDC73, CDH1, CDK4, CDKN1B, CDKN1C, CDKN2A (p14ARF), CDKN2A (p16INK4a), CEBPA, CHEK2, CTNNA1, DICER1, DIS3L2, EGFR (c.2369C>T, p.Thr790Met variant only), EPCAM (Deletion/duplication testing only), FH, FLCN, GATA2, GPC3, GREM1 (Promoter region deletion/duplication testing only), HOXB13 (c.251G>A, p.Gly84Glu), HRAS, KIT, MAX, MEN1, MET, MITF (c.952G>A, p.Glu318Lys variant only), MLH1, MSH2, MSH3, MSH6, MUTYH, NBN, NF1, NF2, NTHL1, PDGFRA, PHOX2B, PMS2, POLD1, POLE, POT1, PRKAR1A, PTCH1, PTEN, RAD50, RAD51C, RAD51D, RB1, RECQL4, RET, RUNX1, SDHAF2, SDHA (sequence changes only), SDHB, SDHC, SDHD, SMAD4, SMARCA4, SMARCB1, SMARCE1, STK11, SUFU, TERC, TERT, TMEM127, TP53, TSC1, TSC2, VHL, WRN and WT1. .     She is a Chartered certified accountant at [employer]. No h/o any cardiology issues. Has always been active. She started [facility] in 2/20. Had surgery on July 7. Now on Kadcyla. Completed XRT on 02/03/19. Tolerating Kadcyla well Remains active. Exercises without a problem    Echo today 03/18/19 EF 60-65% GLS -38.8% Normal diastolic function.   Echo 12/10/18 EF 60-65% GLS -22.2% Personally reviewed Echo 05/30/18 EF 55-60% GLS - 27.1% Echo 08/28/18 EF 55-60% GLS -21.8%  Harlon Ditty denies symptoms worrisome for COVID 19.   Past Medical History:  Diagnosis Date  . Acid reflux   . Acute bronchitis   . Anemia   . Asthma   . Family history of brain cancer   . Family history of breast cancer   . Family history of prostate cancer   . Migraine    Past Surgical History:  Procedure Laterality Date  . BREAST BIOPSY Right 05/23/2018   x3, malignant  . DILATION AND CURETTAGE OF UTERUS  2006 x1, 2009 x2  . LAPAROTOMY  2008  . PORTACATH PLACEMENT Left 06/06/2018   Procedure: INSERTION PORT-A-CATH POSSIBLE ULTRASOUND;  Surgeon: [name], MD;  Location: [facility];  Service: General;  Laterality: Left;     Current Outpatient  Medications  Medication Sig Dispense Refill  . albuterol (PROVENTIL HFA;VENTOLIN HFA) 108 (90 BASE) MCG/ACT inhaler Inhale 2 puffs into the lungs every 6 (six) hours as needed for wheezing or shortness of breath.    . ALPRAZolam (XANAX) 0.5 MG tablet Take 0.25-0.5 mg by mouth at bedtime as needed for anxiety.     Haze Justin Probiotic (BIOGAIA PROBIOTIC) LIQD Take by mouth daily at 8 pm.    . diphenhydrAMINE (BENADRYL) 25 mg capsule Take 25 mg by mouth at bedtime as needed for sleep.    Marland Kitchen ibuprofen (ADVIL) 200 MG tablet Take 400 mg by mouth every 6 (six) hours as needed for headache or moderate pain.    Marland Kitchen letrozole (FEMARA) 2.5 MG tablet Take 1 tablet (2.5 mg total) by mouth daily. 30 tablet 3  . Melatonin 3 MG CAPS Take 3 mg by mouth at bedtime.    . Polyethylene Glycol 3350 (MIRALAX PO) Take 17 g by mouth daily as needed.     . propranolol (INDERAL) 10 MG tablet Take 5 mg by mouth daily as needed (for shaking hands).     . zolpidem (AMBIEN) 5 MG tablet Take 5 mg by mouth at bedtime as needed.     No current facility-administered medications for this encounter.     Allergies:   Patient has no known allergies.   Social History:  The patient  reports that she has never smoked. She has never used smokeless tobacco. She reports previous alcohol use. She reports that she does not use drugs.   Family History:  The patient's family history includes Breast cancer in an other family member; Breast cancer (age of onset: 51) in her maternal great-grandmother; Cancer in her maternal aunt; Cancer (age of onset: 59) in her sister; Cancer - Other in an other family member; Hyperlipidemia in her father; Prostate cancer (age of onset: 25) in her maternal uncle; Prostate cancer (age of onset: 67) in her maternal grandfather; Skin cancer in her maternal grandmother.   ROS:  Please see the history of present illness.   All other systems are personally reviewed and negative.   Vitals:   03/18/19 0912  BP: 99/63   Pulse: (!) 55  SpO2: 100%    Exam:   General:  Well appearing. No resp difficulty HEENT: normal Neck: supple. no JVD. Carotids 2+ bilat; no bruits. No lymphadenopathy or thryomegaly appreciated. Cor: PMI nondisplaced. Regular rate & rhythm. No rubs, gallops or murmurs. Port on left  Lungs: clear Abdomen: soft, nontender, nondistended. No hepatosplenomegaly. No bruits or masses. Good bowel sounds. Extremities: no cyanosis, clubbing, rash, edema Neuro: alert & orientedx3, cranial nerves grossly intact. moves all 4 extremities w/o difficulty. Affect pleasant  ECG sinus brady 52 Personally reviewed   Recent Labs: 03/09/2019: ALT 25; BUN 11; Creatinine 0.72; Hemoglobin 10.7; Platelet Count 161; Potassium 4.1; Sodium 142  Personally reviewed   Wt Readings from Last 3 Encounters:  03/18/19 62.9 kg (138 lb 9.6 oz)  03/09/19 62.5 kg (137 lb 12 oz)  02/16/19 62.5 kg (137 lb 11.2 oz)      ASSESSMENT AND PLAN:  1.  R Breast CA - - s/p surgery, XRT. Tolerating Kadcyla. I reviewed echos personally. EF and Doppler  parameters stable. No HF on exam. Continue Kadcyla.     Signed, Glori Bickers, MD  03/18/2019 9:30 AM  [facility] [place] [address] [phone] (office) [phone] (fax)

## 2019-03-18 NOTE — Addendum Note (Signed)
Encounter addended by: Scarlette Calico, RN on: 03/18/2019 9:41 AM  Actions taken: Order list changed, Diagnosis association updated, Clinical Note Signed

## 2019-03-26 NOTE — Progress Notes (Signed)
[place]   [phone] Fax:(336) [phone]   Clinic Follow up Note   Patient Care Team: [name], MD as PCP - General (Family Medicine) [name], MD as Consulting Physician (General Surgery) [name], MD as Consulting Physician (Hematology) [name], MD as Consulting Physician (Radiation Oncology) [name], MD as Consulting Physician (Obstetrics and Gynecology) [name], RN as Oncology Nurse Navigator [name], RN as Oncology Nurse Navigator  Date of Service:  03/30/2019  CHIEF COMPLAINT: F/u multifocal right breast cancer  SUMMARY OF ONCOLOGIC HISTORY: Oncology History Overview Note  Cancer Staging Cancer of overlapping sites of right female breast [facility]) Staging form: Breast, AJCC 8th Edition - Clinical stage from 05/28/2018: Stage IB (cT2, cN1, cM0, G3, ER+, PR+, HER2+) - Signed by [name], MD on 05/28/2018  Malignant neoplasm of lower-outer quadrant of right breast of female, estrogen receptor positive ([place]) Staging form: Breast, AJCC 8th Edition - Clinical stage from 05/28/2018: Stage IIB (cT2, cN1, cM0, G3, ER+, PR+, HER2-) - Unsigned     Cancer of overlapping sites of right female breast ([place])  05/14/2018 Mammogram   Right breast mammogram 05/14/18  IMPRESSION  There is a 1.0 x 0.5 x 1.2 cm lobular hypoechoic mass right breast 7 o'clock position 3 cm from nipple, a 0.8 x 0.6 x 1.2 cm lobular hypoechoic mass right breast 7 o'clock position 2 cm from the nipple, and a 0.9 x 0.5 x 0.8 cm lobular hypoechoic mass right breast 5 o'clock position 3 cm from nipple.  There is a 3.8 x 1.0 x 1.3 cm lobular hypoechoic mass right breast 5 o'clock position 2 cm from nipple.  Multiple thickened right axillary lymph nodes (approximately 15). Measuring up to 1.0 cm in thickness. Adenopathy corresponds with axillary palpable abnormality.  Indeterminate round and punctate calcifications upper-outer quadrant right  breast.Indeterminate round and punctate calcifications upper-outer quadrant right breast.   05/21/2018 Initial Biopsy   Diagnosis 05/21/18  1. Breast, right, needle core biopsy, axilla, 5 o'clock, ribbon clip - INVASIVE MAMMARY CARCINOMA. - MAMMARY CARCINOMA IN SITU. 2. Breast, right, needle core biopsy, 7 o'clock, coil clip - INVASIVE MAMMARY CARCINOMA. - MAMMARY CARCINOMA IN SITU. 3. Lymph node, needle/core biopsy, right axillary LN, hydromark - METASTATIC MAMMARY CARCINOMA.    05/21/2018 Receptors her2   1. PROGNOSTIC INDICATORS Results: The tumor cells are EQUIVOCAL for Her2 (2+);  HER2 **NEGATIVE** by FISH   Estrogen Receptor: 80%, POSITIVE, MODERATE STAINING INTENSITY Progesterone Receptor: 5%, POSITIVE, MODERATE-WEAK STAINING INTENSITY Proliferation Marker Ki67: 10%  3. PROGNOSTIC INDICATORS Results: The tumor cells are POSITIVE for Her2 (3+). Estrogen Receptor: 90%, POSITIVE, STRONG STAINING INTENSITY Progesterone Receptor: 50%, POSITIVE, MODERATE STAINING INTENSITY Proliferation Marker Ki67: 5%   05/26/2018 Initial Diagnosis   Malignant neoplasm of lower-inner quadrant of right breast of female, estrogen receptor positive ([place])   05/27/2018 Mammogram   Left breast mammogram 05/27/18  IMPRESSION: No mammographic evidence of LEFT breast malignancy.   05/28/2018 Cancer Staging   Staging form: Breast, AJCC 8th Edition - Clinical stage from 05/28/2018: Stage IB (cT2, cN1, cM0, G3, ER+, PR+, HER2+) - Signed by [name], MD on 05/28/2018   06/03/2018 Imaging   MR BREAST BILATERAL W WO CONTRAST INC CAD  IMPRESSION: 1. Large enhancing masses and non mass enhancement identified throughout the majority of the right breast. Additionally, there are smaller masses identified within the upper inner and lower inner right breast. Overall findings are concerning for extensive malignancy throughout all 4 quadrants of  the right breast. 2. Extensive bulky lymphadenopathy identified within  the right axilla. The matted appearance makes counting the number of abnormal lymph nodes difficult as they are immediately approximated to each other. 3. At least one abnormal appearing right internal mammary lymph node. 4. Nonenhancing lesion within the sternum with associated sternal marrow heterogeneity, raising the possibility of osseous metastatic disease within the sternum. 5. Two enhancing masses within the left breast as described above.     06/05/2018 PET scan   NM PET Image Initial (PI) Skull Base To Thigh  IMPRESSION: 1. Multiple borderline enlarged right axillary lymph nodes identified exhibiting mild to moderate increased uptake. Can not rule out nodal metastasis. Correlation with biopsy recommended. 2. Asymmetric increased uptake within the right breast corresponding to MRI abnormality. 3. Subcentimeter right retropectoral and right internal mammary lymph nodes are again identified and exhibit nonspecific, low level FDG uptake. 4. Noncalcified solid nodule within the lateral right lower lobe measures 9 mm without significant radiotracer uptake. 5. No hypermetabolic osseous lesions identified.     06/09/2018 - 11/03/2018 Chemotherapy   Neoadjuvant chemotherapy TCHP, 06/09/2018-09/22/18. Followed by Maintenance Herceptin/Perjeta q3weeks starting 10/14/18 to continue 1 year treatment. Stopped Herceptin/Perjeta on 11/03/18 due to multiple Positive LN of pathology.    06/10/2018 Pathology Results   Surgical pathology  Diagnosis 1. Breast, left, needle core biopsy, LOQ, barbell - MILD FIBROCYSTIC CHANGES. 2. Breast, left, needle core biopsy, UIQ cylinder - MILD FIBROCYSTIC CHANGES.    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 There were no deleterious mutations in AIP, ALK, APC, ATM, AXIN2,BAP1,  BARD1,  BLM, BMPR1A, BRCA1, BRCA2, BRIP1, CASR, CDC73, CDH1, CDK4, CDKN1B, CDKN1C, CDKN2A (p14ARF), CDKN2A (p16INK4a), CEBPA, CHEK2, CTNNA1, DICER1, DIS3L2, EGFR (c.2369C>T, p.Thr790Met variant only), EPCAM (Deletion/duplication testing only), FH, FLCN, GATA2, GPC3, GREM1 (Promoter region deletion/duplication testing only), HOXB13 (c.251G>A, p.Gly84Glu), HRAS, KIT, MAX, MEN1, MET, MITF (c.952G>A, p.Glu318Lys variant only), MLH1, MSH2, MSH3, MSH6, MUTYH, NBN, NF1, NF2, NTHL1, PDGFRA, PHOX2B, PMS2, POLD1, POLE, POT1, PRKAR1A, PTCH1, PTEN, RAD50, RAD51C, RAD51D, RB1, RECQL4, RET, RUNX1, SDHAF2, SDHA (sequence changes only), SDHB, SDHC, SDHD, SMAD4, SMARCA4, SMARCB1, SMARCE1, STK11, SUFU, TERC, TERT, TMEM127, TP53, TSC1, TSC2, VHL, WRN and WT1.  .   09/24/2018 Breast MRI   Breast MRI from [facility] on 09/24/18 Right breast: Signal void inferior right breast, posterior depth from biopsy marker clip. Interval decreased size of the right breast, and now symmetric to the left, compared to prior MRI. Resolution of abnormal nonmass enhancement without evidence of residual enhancing mass. No evidence of axillary or internal mammary lymphadenopathy is seen, markedly improved from prior. There is no abnormal skin, nipple, or pectoralis muscle enhancement.  Left breast: There are no suspicious enhancing masses or areas of non-mass enhancement. No evidence of axillary or internal mammary lymphadenopathy is seen. There is no abnormal skin, nipple, or pectoralis muscle enhancement.   10/09/2018 Imaging   CT chest W Contrast 10/09/18  IMPRESSION: 1. Stable 10 mm right lower lobe pulmonary nodule since 06/05/2018. Nodule is not substantially hypermetabolic on the PET-CT. Continued attention on follow-up recommended. 2. No findings to suggest metastatic disease in the thorax.   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 - Biopsy clips identified - Margin status (see extended anterior margin below)  Invasive carcinoma: Positive anterior-inferior margin  Ductal carcinoma in situ: Positive anterior-inferior margin - Ancillary studies previously reported on [id number](5:00, ribbon clip) Estrogen receptor: Positive (80%) Progesterone receptor: Positive (10%) HER2 IHC: Equivocal (2+) HER2 FISH: Negative HER2/CEP17 ratio: 1.35 Mean HER2 copy number: 1.55 - Ancillary studies previously reported on [id number] (right axillary lymph node) Estrogen receptor: Positive (90%) Progesterone receptor: Positive (20%) HER2 IHC: Positive (3+) - Residual Cancer Burden  Tumor bed size: 41 mm x 20 mm Overall tumor cellularity: 10% Percentage in situ: 60% Number of involved lymph nodes: 7 Diameter of largest metastasis: 6 mm Residual Cancer Burden Score: 3.108 Residual Cancer Burden Class: RCB-II - Other findings: Atypical lobular hyperplasia and columnar cell change with microcalcifications; fibroadenomas with microcalcifications  B: Right axilla, targeted lymph node excision - Six of seven lymph nodes positive for carcinoma (6/7) - Size of largest tumor deposit: 6 mm - Biopsy site identified - Treatment effect: Present - Extracapsular extension: Absent  C: Right axilla, lymph node dissection - One of seven lymph nodes positive for carcinoma, macrometastasis, 6 mm (1/7) - Treatment effect: Absent - Extracapsular extension: Absent  D: Right breast, nipple core biopsy - Negative for carcinoma  E: Breast, right, anterior margin, excision - Microscopic foci of ductal carcinoma in situ - Margin: Negative, <1 mm    11/05/2018 Cancer Staging   Staging form: Breast, AJCC 8th Edition -  Pathologic stage from 11/05/2018: No Stage Recommended (ypT1c, pN2a, cM0, G2, ER+, PR+, HER2-) - Signed by [name], MD on 11/23/2018   11/24/2018 -  Chemotherapy   Due to positive LN on surgical pathology her maintance Herceptin/Perjeta was changed to [place] for about 14 cycles on 11/24/18   12/24/2018 - 02/03/2019 Radiation Therapy   adjuvant radiation at [facility] on 12/24/18 and completed on 02/03/19.    01/23/2019 Imaging   CT Chest wo  IMPRESSION: 1. Stable right lower lobe pulmonary nodule. 2. No signs of new or suspicious finding since study of 10/09/2018, now status post right mastectomy, axillary dissection and breast reconstruction.   02/16/2019 -  Anti-estrogen oral therapy   -Monthly Zoladex injection started 12/19/18 -Letrozole 2.5 mg started 02/16/19       CURRENT THERAPY:  -Due to positive LN on surgical pathology hermaintenanceHerceptin/Perjeta was changed to Kadcyla q3weeks for about 14 cycles on 11/24/18 -Monthly Zoladex injection started 12/19/18 -Letrozole 2.5 mg started 02/16/19   INTERVAL HISTORY:  Kim [name] is here for a follow up and treatment. She presents to the clinic alone. She notes she is doing well and stable with no new changes. She notes she is tolerating Letrozole. She does have hot flashes all day which is manageable. She feels it can effect her sleep. She feels her mood in up and down which she feels it is related to stress as well. She is able to manage her mood during the day but trying to use sleep aide to help her sleep. She uses half to whole Ambien or half xanax to get her to sleep. She notes she has moments of anxiety and depression but are not temporary on certain days.     REVIEW OF SYSTEMS:   Constitutional: Denies fevers, chills or abnormal weight loss (+) Hot flashes, manageable (+) trouble sleeping  Eyes: Denies blurriness of vision Ears, nose, mouth, throat, and face: Denies mucositis or sore throat Respiratory:  Denies cough, dyspnea or wheezes Cardiovascular: Denies palpitation, chest discomfort or lower extremity swelling Gastrointestinal:  Denies nausea, heartburn or change in bowel habits Skin: Denies abnormal skin rashes Lymphatics: Denies new lymphadenopathy or easy bruising Neurological:Denies numbness, tingling or new weaknesses Behavioral/Psych: (+) Mood swings, manageable  All other systems were reviewed with the patient and are negative.  MEDICAL HISTORY:  Past Medical History:  Diagnosis Date  . Acid reflux   . Acute bronchitis   . Anemia   . Asthma   . Family history of brain cancer   . Family history of breast cancer   . Family history of prostate cancer   . Migraine     SURGICAL HISTORY: Past Surgical History:  Procedure Laterality Date  . BREAST BIOPSY Right 05/23/2018   x3, malignant  . DILATION AND CURETTAGE OF UTERUS     2006 x1, 2009 x2  . LAPAROTOMY  2008  . PORTACATH PLACEMENT Left 06/06/2018   Procedure: INSERTION PORT-A-CATH POSSIBLE ULTRASOUND;  Surgeon: [name], MD;  Location: [place];  Service: General;  Laterality: Left;    I have reviewed the social history and family history with the patient and they are unchanged from previous note.  ALLERGIES:  has No Known Allergies.  MEDICATIONS:  Current Outpatient Medications  Medication Sig Dispense Refill  . albuterol (PROVENTIL HFA;VENTOLIN HFA) 108 (90 BASE) MCG/ACT inhaler Inhale 2 puffs into the lungs every 6 (six) hours as needed for wheezing or shortness of breath.    . ALPRAZolam (XANAX) 0.5 MG tablet Take 0.25-0.5 mg by mouth at bedtime as needed for anxiety.     Haze Justin Probiotic (BIOGAIA PROBIOTIC) LIQD Take by mouth daily at 8 pm.    . diphenhydrAMINE (BENADRYL) 25 mg capsule Take 25 mg by mouth at bedtime as needed for sleep.    Marland Kitchen ibuprofen (ADVIL) 200 MG tablet Take 400 mg by mouth every 6 (six) hours as needed for headache or moderate pain.    Marland Kitchen letrozole (FEMARA) 2.5  MG tablet Take 1 tablet (2.5 mg total) by mouth daily. 30 tablet 3  . Melatonin 3 MG CAPS Take 3 mg by mouth at bedtime.    . Polyethylene Glycol 3350 (MIRALAX PO) Take 17 g by mouth daily as needed.     . propranolol (INDERAL) 10 MG tablet Take 5 mg by mouth daily as needed (for shaking hands).     . zolpidem (AMBIEN) 5 MG tablet Take 5 mg by mouth at bedtime as needed.    . sertraline (ZOLOFT) 50 MG tablet Take 1 tablet (50 mg total) by mouth daily. 30 tablet 1   No current facility-administered medications for this visit.   Facility-Administered Medications Ordered in Other Visits  Medication Dose Route Frequency Provider Last Rate Last Admin  . sodium chloride flush (NS) 0.9 % injection 10 mL  10 mL Intracatheter PRN [name], MD   10 mL at 03/30/19 1204    PHYSICAL EXAMINATION: ECOG PERFORMANCE STATUS: 0 - Asymptomatic  Vitals:   03/30/19 0904  BP: 100/68  Pulse: 60  Resp: 18  Temp: 98 F (36.7 C)  SpO2: 100%   Filed Weights   03/30/19 0904  Weight: 137 lb 3.2 oz (62.2 kg)    GENERAL:alert, no distress and comfortable SKIN: skin color, texture, turgor are normal, no rashes or significant lesions (+) Loss of left toe nail  with mild fungal infection.  EYES: normal, Conjunctiva are pink and non-injected, sclera clear  NECK: supple, thyroid normal size, non-tender, without nodularity LYMPH:  no palpable lymphadenopathy in the cervical, axillary  LUNGS: clear to auscultation and percussion with normal breathing effort HEART: regular rate & rhythm and no murmurs and no lower extremity edema ABDOMEN:abdomen soft, non-tender and normal bowel sounds Musculoskeletal:no cyanosis of digits and no clubbing  NEURO: alert & oriented x 3 with fluent speech, no focal motor/sensory deficits  LABORATORY DATA:  I have reviewed the data as listed CBC Latest Ref Rng & Units 03/30/2019 03/09/2019 02/16/2019  WBC 4.0 - 10.5 K/uL 2.2(L) 3.1(L) 1.7(L)  Hemoglobin 12.0 - 15.0 g/dL 10.2(L)  10.7(L) 10.3(L)  Hematocrit 36.0 - 46.0 % 31.2(L) 33.5(L) 31.6(L)  Platelets 150 - 400 K/uL 126(L) 161 104(L)     CMP Latest Ref Rng & Units 03/30/2019 03/09/2019 02/16/2019  Glucose 70 - 99 mg/dL 96 88 135(H)  BUN 6 - 20 mg/dL '13 11 13  ' Creatinine 0.44 - 1.00 mg/dL 0.74 0.72 0.74  Sodium 135 - 145 mmol/L 142 142 142  Potassium 3.5 - 5.1 mmol/L 4.1 4.1 3.6  Chloride 98 - 111 mmol/L 105 104 107  CO2 22 - 32 mmol/L '29 29 26  ' Calcium 8.9 - 10.3 mg/dL 9.0 9.4 9.1  Total Protein 6.5 - 8.1 g/dL 6.6 7.3 6.8  Total Bilirubin 0.3 - 1.2 mg/dL 0.4 0.4 0.3  Alkaline Phos 38 - 126 U/L 92 108 82  AST 15 - 41 U/L 13(L) 15 14(L)  ALT 0 - 44 U/L '16 25 25      ' RADIOGRAPHIC STUDIES: I have personally reviewed the radiological images as listed and agreed with the findings in the report. No results found.   ASSESSMENT & PLAN:  Aylssa Herrig is a 43 y.o. 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 She completed adjuvant [facility].  -She plans to doprophylactic left mastectomy and b/l reconstructionsurgeryin 06/2019.  -Her ER/PR positive disease I started her on antiestrogen therapy with monthly Zoladex injection in 12/2018 and on Letrozole in 02/2019. Tolerating well with manageable hot flashes and mood swings.  -Due to positive LNs she is on adjuvant Kadcyla q3weeks to complete 1 year treatment due to her high risk disease.  -From a cancer standpoint, she is doing well. Labs reviewed mostly stable and overall adequate to proceed with Kadcyla today. Will give Udenyca this cycle due to neutropenia.  -I encourage her to participate survivorship  -F/u in 6 weeks     2.Genetics:PALB33mtation+ -Genetic testingfrom [facility] done 05/2018 was positive for PALB236mation. She has discovered a stronger maternal family history of prostate and breast cancerin her 1st, 2nd and 3rd degree relatives, Including her mother who was recently diagnosed.  -We discussed the risk of future breast cancer from PALB2m15mtion. Given her family history, she plans to doprophylactic left mastectomy and b/l reconstructionsurgeryin 06/2019.  -While there is an increased risk for other cancers, there are no current guidelines for screening for these cancers, and at this time NCCN does not suggest a discussion of risk reducing salpingo-oophorectomy. -She has talked to GYN about her BSO and Hysterectomy in 2021   3. Asthma  -mild, continuealbuterol inhaler as needed -I previously discussed she is at  high risk for infection. If she develops fever or productive cough with phlegm she can contact our clinic. 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 104m RLL lung nodule  -Her initial 06/05/18 PET showed a 9 mm solid rounded nodule within the lateral right lower lobe, not hypermetabolic -Her attempted lung biopsy was unsuccessful. Will monitor withfollow upCT scanin 3 months -Stable on 01/23/19 CT chest. Will continue to monitor for 2 years.  -repeat CT chest in Feb or March 2021  6. Insomnia, Stress/Anxiety -She has been having insomnia issue since cancer diagnosis, she notes benadryl has not worked and she has been on melatonin for years.  -She currently uses half to whole Ambien or half xanax to get her to sleep.  -She does have mood swings and hot flashes from Letrozole. She feels she is managing her mood and any anxiety/depression well with exercise, coping tools and she has a good support system. But feels at  night or napping her stress and mood get to her.  -I discussed to help her mood, sleep and hot flashes, I recommend SSRI Zoloft. I reviewed side effects with her and that it will take 4-6 weeks to take effect. She can continue for a few years for stabilization and come off when she is ready. She is agreeable to try. I will call in today (03/30/19).  -She is also considering seeing a therapist. I encouraged her to proceed.   7. Bone Health  -I reviewed AI such as letrozole can weaken her bone  -I recommend she start Vitrain D OTC daily. Will monitor her Vit D Level.    Plan -I called in Zoloft today  -Labs reviewed and adequate to proceed with Kadcyla today and continue every 3 weeks  -Continue Letrozole  -Zoladex injection on 12/28 and in 6 weeks.  -Lab, flush, f/u and Kadcyla in 6 weeks -I will schedule her survivorship in 3 weeks     No problem-specific Assessment & Plan notes found for this encounter.   No orders of the defined types were placed in this encounter.  All questions were answered. The patient knows to call the clinic with any problems, questions or concerns. No barriers to learning was detected. I spent 20 minutes counseling the patient face to face. The total time spent in the appointment was 25 minutes and more than 50% was on counseling and review of test results     YTruitt Merle MD 03/30/2019   I, AJoslyn Devon am acting as scribe for YTruitt Merle MD.   I have reviewed the above documentation for accuracy and completeness, and I agree with the above.

## 2019-03-30 ENCOUNTER — Encounter: Payer: Self-pay | Admitting: Hematology

## 2019-03-30 ENCOUNTER — Other Ambulatory Visit: Payer: Self-pay

## 2019-03-30 ENCOUNTER — Inpatient Hospital Stay (HOSPITAL_BASED_OUTPATIENT_CLINIC_OR_DEPARTMENT_OTHER): Payer: BC Managed Care – PPO | Admitting: Hematology

## 2019-03-30 ENCOUNTER — Inpatient Hospital Stay: Payer: BC Managed Care – PPO

## 2019-03-30 ENCOUNTER — Inpatient Hospital Stay: Payer: BC Managed Care – PPO | Attending: Hematology

## 2019-03-30 VITALS — BP 100/68 | HR 60 | Temp 98.0°F | Resp 18 | Ht 69.0 in | Wt 137.2 lb

## 2019-03-30 DIAGNOSIS — J45909 Unspecified asthma, uncomplicated: Secondary | ICD-10-CM | POA: Diagnosis not present

## 2019-03-30 DIAGNOSIS — D61810 Antineoplastic chemotherapy induced pancytopenia: Secondary | ICD-10-CM | POA: Insufficient documentation

## 2019-03-30 DIAGNOSIS — F419 Anxiety disorder, unspecified: Secondary | ICD-10-CM | POA: Insufficient documentation

## 2019-03-30 DIAGNOSIS — Z5189 Encounter for other specified aftercare: Secondary | ICD-10-CM | POA: Insufficient documentation

## 2019-03-30 DIAGNOSIS — Z5112 Encounter for antineoplastic immunotherapy: Secondary | ICD-10-CM | POA: Insufficient documentation

## 2019-03-30 DIAGNOSIS — Z95828 Presence of other vascular implants and grafts: Secondary | ICD-10-CM

## 2019-03-30 DIAGNOSIS — N951 Menopausal and female climacteric states: Secondary | ICD-10-CM | POA: Diagnosis not present

## 2019-03-30 DIAGNOSIS — Z170 Estrogen receptor positive status [ER+]: Secondary | ICD-10-CM | POA: Diagnosis not present

## 2019-03-30 DIAGNOSIS — C50811 Malignant neoplasm of overlapping sites of right female breast: Secondary | ICD-10-CM

## 2019-03-30 DIAGNOSIS — R911 Solitary pulmonary nodule: Secondary | ICD-10-CM | POA: Insufficient documentation

## 2019-03-30 DIAGNOSIS — Z5111 Encounter for antineoplastic chemotherapy: Secondary | ICD-10-CM | POA: Insufficient documentation

## 2019-03-30 DIAGNOSIS — G4700 Insomnia, unspecified: Secondary | ICD-10-CM | POA: Insufficient documentation

## 2019-03-30 DIAGNOSIS — Z79811 Long term (current) use of aromatase inhibitors: Secondary | ICD-10-CM | POA: Diagnosis not present

## 2019-03-30 LAB — CMP (CANCER CENTER ONLY)
ALT: 16 U/L (ref 0–44)
AST: 13 U/L — ABNORMAL LOW (ref 15–41)
Albumin: 3.9 g/dL (ref 3.5–5.0)
Alkaline Phosphatase: 92 U/L (ref 38–126)
Anion gap: 8 (ref 5–15)
BUN: 13 mg/dL (ref 6–20)
CO2: 29 mmol/L (ref 22–32)
Calcium: 9 mg/dL (ref 8.9–10.3)
Chloride: 105 mmol/L (ref 98–111)
Creatinine: 0.74 mg/dL (ref 0.44–1.00)
GFR, Est AFR Am: >60 mL/min (ref >60)
GFR, Estimated: >60 mL/min (ref >60)
Glucose, Bld: 96 mg/dL (ref 70–99)
Potassium: 4.1 mmol/L (ref 3.5–5.1)
Sodium: 142 mmol/L (ref 135–145)
Total Bilirubin: 0.4 mg/dL (ref 0.3–1.2)
Total Protein: 6.6 g/dL (ref 6.5–8.1)

## 2019-03-30 LAB — CBC WITH DIFFERENTIAL (CANCER CENTER ONLY)
Abs Immature Granulocytes: 0 10*3/uL (ref 0.00–0.07)
Basophils Absolute: 0 10*3/uL (ref 0.0–0.1)
Basophils Relative: 1 %
Eosinophils Absolute: 0.1 10*3/uL (ref 0.0–0.5)
Eosinophils Relative: 6 %
HCT: 31.2 % — ABNORMAL LOW (ref 36.0–46.0)
Hemoglobin: 10.2 g/dL — ABNORMAL LOW (ref 12.0–15.0)
Immature Granulocytes: 0 %
Lymphocytes Relative: 25 %
Lymphs Abs: 0.5 10*3/uL — ABNORMAL LOW (ref 0.7–4.0)
MCH: 30.3 pg (ref 26.0–34.0)
MCHC: 32.7 g/dL (ref 30.0–36.0)
MCV: 92.6 fL (ref 80.0–100.0)
Monocytes Absolute: 0.3 10*3/uL (ref 0.1–1.0)
Monocytes Relative: 13 %
Neutro Abs: 1.2 10*3/uL — ABNORMAL LOW (ref 1.7–7.7)
Neutrophils Relative %: 55 %
Platelet Count: 126 10*3/uL — ABNORMAL LOW (ref 150–400)
RBC: 3.37 MIL/uL — ABNORMAL LOW (ref 3.87–5.11)
RDW: 14.7 % (ref 11.5–15.5)
WBC Count: 2.2 10*3/uL — ABNORMAL LOW (ref 4.0–10.5)
nRBC: 0 % (ref 0.0–0.2)

## 2019-03-30 MED ORDER — SODIUM CHLORIDE 0.9 % IV SOLN
3.6000 mg/kg | Freq: Once | INTRAVENOUS | Status: AC
  Administered 2019-03-30: 220 mg via INTRAVENOUS
  Filled 2019-03-30: qty 5

## 2019-03-30 MED ORDER — SERTRALINE HCL 50 MG PO TABS: 50.0000 mg | ORAL_TABLET | Freq: Every day | ORAL | 1 refills | Status: DC

## 2019-03-30 MED ORDER — DIPHENHYDRAMINE HCL 25 MG PO CAPS
ORAL_CAPSULE | ORAL | Status: AC
  Filled 2019-03-30: qty 2

## 2019-03-30 MED ORDER — SODIUM CHLORIDE 0.9 % IV SOLN
Freq: Once | INTRAVENOUS | Status: AC
  Administered 2019-03-30: 10:00:00 via INTRAVENOUS
  Filled 2019-03-30: qty 250

## 2019-03-30 MED ORDER — HEPARIN SOD (PORK) LOCK FLUSH 100 UNIT/ML IV SOLN
500.0000 [IU] | Freq: Once | INTRAVENOUS | Status: AC | PRN
  Administered 2019-03-30: 500 [IU]
  Filled 2019-03-30: qty 5

## 2019-03-30 MED ORDER — ACETAMINOPHEN 325 MG PO TABS
650.0000 mg | ORAL_TABLET | Freq: Once | ORAL | Status: AC
  Administered 2019-03-30: 650 mg via ORAL

## 2019-03-30 MED ORDER — SODIUM CHLORIDE 0.9% FLUSH
10.0000 mL | INTRAVENOUS | Status: DC | PRN
  Administered 2019-03-30: 10 mL
  Filled 2019-03-30: qty 10

## 2019-03-30 MED ORDER — ACETAMINOPHEN 325 MG PO TABS
ORAL_TABLET | ORAL | Status: AC
  Filled 2019-03-30: qty 2

## 2019-03-30 MED ORDER — DIPHENHYDRAMINE HCL 25 MG PO CAPS
50.0000 mg | ORAL_CAPSULE | Freq: Once | ORAL | Status: AC
  Administered 2019-03-30: 50 mg via ORAL

## 2019-03-30 NOTE — Patient Instructions (Signed)
[facility] Discharge Instructions for Patients Receiving Chemotherapy  Today you received the following chemotherapy agents :  Kadcyla.  To help prevent nausea and vomiting after your treatment, we encourage you to take your nausea medication as prescribed.   If you develop nausea and vomiting that is not controlled by your nausea medication, call the clinic.   BELOW ARE SYMPTOMS THAT SHOULD BE REPORTED IMMEDIATELY:  *FEVER GREATER THAN 100.5 F  *CHILLS WITH OR WITHOUT FEVER  NAUSEA AND VOMITING THAT IS NOT CONTROLLED WITH YOUR NAUSEA MEDICATION  *UNUSUAL SHORTNESS OF BREATH  *UNUSUAL BRUISING OR BLEEDING  TENDERNESS IN MOUTH AND THROAT WITH OR WITHOUT PRESENCE OF ULCERS  *URINARY PROBLEMS  *BOWEL PROBLEMS  UNUSUAL RASH Items with * indicate a potential emergency and should be followed up as soon as possible.  Feel free to call the clinic should you have any questions or concerns. The clinic phone number is [phone].  Please show the CHEMO ALERT CARD at check-in to the Emergency Department and triage nurse.   

## 2019-03-30 NOTE — Progress Notes (Signed)
Per Dr. Burr Medico,  [place] to treat with  WBC 2.2 ,  ANC  1.2  Today.

## 2019-03-31 ENCOUNTER — Telehealth: Payer: Self-pay | Admitting: Hematology

## 2019-03-31 NOTE — Telephone Encounter (Signed)
Scheduled appt per 12/14 los.  Spoke with pt and she stated she will check her my chart.

## 2019-04-01 ENCOUNTER — Other Ambulatory Visit: Payer: Self-pay

## 2019-04-01 ENCOUNTER — Inpatient Hospital Stay: Payer: BC Managed Care – PPO

## 2019-04-01 ENCOUNTER — Other Ambulatory Visit: Payer: Self-pay | Admitting: Hematology

## 2019-04-01 VITALS — BP 102/64 | HR 60 | Temp 97.9°F | Resp 18

## 2019-04-01 DIAGNOSIS — Z95828 Presence of other vascular implants and grafts: Secondary | ICD-10-CM

## 2019-04-01 DIAGNOSIS — Z5112 Encounter for antineoplastic immunotherapy: Secondary | ICD-10-CM | POA: Diagnosis not present

## 2019-04-01 MED ORDER — GOSERELIN ACETATE 3.6 MG ~~LOC~~ IMPL: 3.6000 mg | DRUG_IMPLANT | Freq: Once | SUBCUTANEOUS | Status: DC

## 2019-04-01 MED ORDER — PEGFILGRASTIM-CBQV 6 MG/0.6ML ~~LOC~~ SOSY
PREFILLED_SYRINGE | SUBCUTANEOUS | Status: AC
  Filled 2019-04-01: qty 0.6

## 2019-04-01 MED ORDER — PEGFILGRASTIM-CBQV 6 MG/0.6ML ~~LOC~~ SOSY
6.0000 mg | PREFILLED_SYRINGE | Freq: Once | SUBCUTANEOUS | Status: AC
  Administered 2019-04-01: 6 mg via SUBCUTANEOUS

## 2019-04-01 MED ORDER — GOSERELIN ACETATE 3.6 MG ~~LOC~~ IMPL
DRUG_IMPLANT | SUBCUTANEOUS | Status: AC
  Filled 2019-04-01: qty 3.6

## 2019-04-01 NOTE — Patient Instructions (Addendum)

## 2019-04-13 ENCOUNTER — Other Ambulatory Visit: Payer: Self-pay

## 2019-04-13 ENCOUNTER — Inpatient Hospital Stay: Payer: BC Managed Care – PPO

## 2019-04-13 VITALS — BP 99/65 | HR 56 | Temp 98.0°F | Resp 18

## 2019-04-13 DIAGNOSIS — Z5112 Encounter for antineoplastic immunotherapy: Secondary | ICD-10-CM | POA: Diagnosis not present

## 2019-04-13 DIAGNOSIS — Z95828 Presence of other vascular implants and grafts: Secondary | ICD-10-CM

## 2019-04-13 MED ORDER — PEGFILGRASTIM-CBQV 6 MG/0.6ML ~~LOC~~ SOSY: 6.0000 mg | PREFILLED_SYRINGE | Freq: Once | SUBCUTANEOUS | Status: DC

## 2019-04-13 MED ORDER — PEGFILGRASTIM-CBQV 6 MG/0.6ML ~~LOC~~ SOSY
PREFILLED_SYRINGE | SUBCUTANEOUS | Status: AC
  Filled 2019-04-13: qty 0.6

## 2019-04-13 MED ORDER — GOSERELIN ACETATE 3.6 MG ~~LOC~~ IMPL
DRUG_IMPLANT | SUBCUTANEOUS | Status: AC
  Filled 2019-04-13: qty 3.6

## 2019-04-13 MED ORDER — GOSERELIN ACETATE 3.6 MG ~~LOC~~ IMPL
3.6000 mg | DRUG_IMPLANT | Freq: Once | SUBCUTANEOUS | Status: AC
  Administered 2019-04-13: 3.6 mg via SUBCUTANEOUS

## 2019-04-13 NOTE — Patient Instructions (Addendum)

## 2019-04-20 ENCOUNTER — Encounter: Payer: Self-pay | Admitting: *Deleted

## 2019-04-20 ENCOUNTER — Inpatient Hospital Stay: Payer: BC Managed Care – PPO | Admitting: Nurse Practitioner

## 2019-04-20 ENCOUNTER — Inpatient Hospital Stay: Payer: BC Managed Care – PPO | Attending: Hematology

## 2019-04-20 ENCOUNTER — Inpatient Hospital Stay: Payer: BC Managed Care – PPO

## 2019-04-20 ENCOUNTER — Encounter: Payer: Self-pay | Admitting: Nurse Practitioner

## 2019-04-20 ENCOUNTER — Ambulatory Visit: Payer: BC Managed Care – PPO

## 2019-04-20 ENCOUNTER — Other Ambulatory Visit: Payer: Self-pay

## 2019-04-20 ENCOUNTER — Other Ambulatory Visit: Payer: Self-pay | Admitting: *Deleted

## 2019-04-20 VITALS — BP 92/58 | HR 60 | Temp 98.0°F | Resp 17 | Ht 69.0 in | Wt 135.8 lb

## 2019-04-20 DIAGNOSIS — E2839 Other primary ovarian failure: Secondary | ICD-10-CM | POA: Diagnosis not present

## 2019-04-20 DIAGNOSIS — C50811 Malignant neoplasm of overlapping sites of right female breast: Secondary | ICD-10-CM | POA: Diagnosis not present

## 2019-04-20 DIAGNOSIS — D61810 Antineoplastic chemotherapy induced pancytopenia: Secondary | ICD-10-CM | POA: Diagnosis not present

## 2019-04-20 DIAGNOSIS — Z1509 Genetic susceptibility to other malignant neoplasm: Secondary | ICD-10-CM | POA: Diagnosis not present

## 2019-04-20 DIAGNOSIS — G4700 Insomnia, unspecified: Secondary | ICD-10-CM | POA: Insufficient documentation

## 2019-04-20 DIAGNOSIS — C773 Secondary and unspecified malignant neoplasm of axilla and upper limb lymph nodes: Secondary | ICD-10-CM | POA: Diagnosis not present

## 2019-04-20 DIAGNOSIS — J45909 Unspecified asthma, uncomplicated: Secondary | ICD-10-CM | POA: Diagnosis not present

## 2019-04-20 DIAGNOSIS — F419 Anxiety disorder, unspecified: Secondary | ICD-10-CM | POA: Diagnosis not present

## 2019-04-20 DIAGNOSIS — Z170 Estrogen receptor positive status [ER+]: Secondary | ICD-10-CM

## 2019-04-20 DIAGNOSIS — Z5111 Encounter for antineoplastic chemotherapy: Secondary | ICD-10-CM | POA: Diagnosis present

## 2019-04-20 DIAGNOSIS — N951 Menopausal and female climacteric states: Secondary | ICD-10-CM | POA: Insufficient documentation

## 2019-04-20 DIAGNOSIS — Z9011 Acquired absence of right breast and nipple: Secondary | ICD-10-CM | POA: Diagnosis not present

## 2019-04-20 DIAGNOSIS — Z95828 Presence of other vascular implants and grafts: Secondary | ICD-10-CM

## 2019-04-20 DIAGNOSIS — Z5189 Encounter for other specified aftercare: Secondary | ICD-10-CM | POA: Diagnosis not present

## 2019-04-20 DIAGNOSIS — Z1501 Genetic susceptibility to malignant neoplasm of breast: Secondary | ICD-10-CM | POA: Insufficient documentation

## 2019-04-20 DIAGNOSIS — Z5112 Encounter for antineoplastic immunotherapy: Secondary | ICD-10-CM | POA: Insufficient documentation

## 2019-04-20 DIAGNOSIS — Z79811 Long term (current) use of aromatase inhibitors: Secondary | ICD-10-CM | POA: Insufficient documentation

## 2019-04-20 DIAGNOSIS — R911 Solitary pulmonary nodule: Secondary | ICD-10-CM | POA: Insufficient documentation

## 2019-04-20 DIAGNOSIS — Z9221 Personal history of antineoplastic chemotherapy: Secondary | ICD-10-CM | POA: Insufficient documentation

## 2019-04-20 DIAGNOSIS — Z923 Personal history of irradiation: Secondary | ICD-10-CM | POA: Insufficient documentation

## 2019-04-20 LAB — CBC WITH DIFFERENTIAL (CANCER CENTER ONLY)
Abs Immature Granulocytes: 0.02 10*3/uL (ref 0.00–0.07)
Basophils Absolute: 0.1 10*3/uL (ref 0.0–0.1)
Basophils Relative: 2 %
Eosinophils Absolute: 0.1 10*3/uL (ref 0.0–0.5)
Eosinophils Relative: 3 %
HCT: 31.8 % — ABNORMAL LOW (ref 36.0–46.0)
Hemoglobin: 10.4 g/dL — ABNORMAL LOW (ref 12.0–15.0)
Immature Granulocytes: 1 %
Lymphocytes Relative: 23 %
Lymphs Abs: 0.7 10*3/uL (ref 0.7–4.0)
MCH: 30.8 pg (ref 26.0–34.0)
MCHC: 32.7 g/dL (ref 30.0–36.0)
MCV: 94.1 fL (ref 80.0–100.0)
Monocytes Absolute: 0.4 10*3/uL (ref 0.1–1.0)
Monocytes Relative: 12 %
Neutro Abs: 1.9 10*3/uL (ref 1.7–7.7)
Neutrophils Relative %: 59 %
Platelet Count: 140 10*3/uL — ABNORMAL LOW (ref 150–400)
RBC: 3.38 MIL/uL — ABNORMAL LOW (ref 3.87–5.11)
RDW: 14.1 % (ref 11.5–15.5)
WBC Count: 3.2 10*3/uL — ABNORMAL LOW (ref 4.0–10.5)
nRBC: 0 % (ref 0.0–0.2)

## 2019-04-20 LAB — VITAMIN D 25 HYDROXY (VIT D DEFICIENCY, FRACTURES): Vit D, 25-Hydroxy: 32.9 ng/mL (ref 30–100)

## 2019-04-20 LAB — CMP (CANCER CENTER ONLY)
ALT: 17 U/L (ref 0–44)
AST: 12 U/L — ABNORMAL LOW (ref 15–41)
Albumin: 4.2 g/dL (ref 3.5–5.0)
Alkaline Phosphatase: 96 U/L (ref 38–126)
Anion gap: 9 (ref 5–15)
BUN: 12 mg/dL (ref 6–20)
CO2: 27 mmol/L (ref 22–32)
Calcium: 9 mg/dL (ref 8.9–10.3)
Chloride: 106 mmol/L (ref 98–111)
Creatinine: 0.72 mg/dL (ref 0.44–1.00)
GFR, Est AFR Am: >60 mL/min (ref >60)
GFR, Estimated: >60 mL/min (ref >60)
Glucose, Bld: 83 mg/dL (ref 70–99)
Potassium: 3.9 mmol/L (ref 3.5–5.1)
Sodium: 142 mmol/L (ref 135–145)
Total Bilirubin: 0.4 mg/dL (ref 0.3–1.2)
Total Protein: 7.2 g/dL (ref 6.5–8.1)

## 2019-04-20 MED ORDER — SODIUM CHLORIDE 0.9 % IV SOLN
Freq: Once | INTRAVENOUS | Status: AC
  Filled 2019-04-20: qty 250

## 2019-04-20 MED ORDER — ACETAMINOPHEN 325 MG PO TABS
ORAL_TABLET | ORAL | Status: AC
  Filled 2019-04-20: qty 2

## 2019-04-20 MED ORDER — ACETAMINOPHEN 325 MG PO TABS
650.0000 mg | ORAL_TABLET | Freq: Once | ORAL | Status: AC
  Administered 2019-04-20: 650 mg via ORAL

## 2019-04-20 MED ORDER — SODIUM CHLORIDE 0.9 % IV SOLN
3.6000 mg/kg | Freq: Once | INTRAVENOUS | Status: AC
  Administered 2019-04-20: 12:00:00 220 mg via INTRAVENOUS
  Filled 2019-04-20: qty 5

## 2019-04-20 MED ORDER — DIPHENHYDRAMINE HCL 25 MG PO CAPS
50.0000 mg | ORAL_CAPSULE | Freq: Once | ORAL | Status: AC
  Administered 2019-04-20: 50 mg via ORAL

## 2019-04-20 MED ORDER — SODIUM CHLORIDE 0.9% FLUSH
10.0000 mL | INTRAVENOUS | Status: DC | PRN
  Administered 2019-04-20: 10 mL
  Filled 2019-04-20: qty 10

## 2019-04-20 MED ORDER — HEPARIN SOD (PORK) LOCK FLUSH 100 UNIT/ML IV SOLN
500.0000 [IU] | Freq: Once | INTRAVENOUS | Status: AC | PRN
  Administered 2019-04-20: 500 [IU]
  Filled 2019-04-20: qty 5

## 2019-04-20 MED ORDER — DIPHENHYDRAMINE HCL 25 MG PO CAPS
ORAL_CAPSULE | ORAL | Status: AC
  Filled 2019-04-20: qty 2

## 2019-04-20 NOTE — Progress Notes (Addendum)
CLINIC:  Survivorship   Patient Care Team: [name], MD as PCP - General (Family Medicine) [name], MD as Consulting Physician (General Surgery) [name], MD as Consulting Physician (Hematology) [name], MD as Consulting Physician (Radiation Oncology) [name], MD as Consulting Physician (Obstetrics and Gynecology) [name], RN as Oncology Nurse Navigator [name], RN as Oncology Nurse Navigator [name], NP as Nurse Practitioner (Nurse Practitioner)  REASON FOR VISIT:  Routine follow-up post-treatment for a recent history of breast cancer.  BRIEF ONCOLOGIC HISTORY:  Oncology History Overview Note  Cancer Staging Cancer of overlapping sites of right female breast [facility]) Staging form: Breast, AJCC 8th Edition - Clinical stage from 05/28/2018: Stage IB (cT2, cN1, cM0, G3, ER+, PR+, HER2+) - Signed by [name], MD on 05/28/2018  Malignant neoplasm of lower-outer quadrant of right breast of female, estrogen receptor positive ([place]) Staging form: Breast, AJCC 8th Edition - Clinical stage from 05/28/2018: Stage IIB (cT2, cN1, cM0, G3, ER+, PR+, HER2-) - Unsigned     Cancer of overlapping sites of right female breast ([place])  05/14/2018 Mammogram   Right breast mammogram 05/14/18  IMPRESSION  There is a 1.0 x 0.5 x 1.2 cm lobular hypoechoic mass right breast 7 o'clock position 3 cm from nipple, a 0.8 x 0.6 x 1.2 cm lobular hypoechoic mass right breast 7 o'clock position 2 cm from the nipple, and a 0.9 x 0.5 x 0.8 cm lobular hypoechoic mass right breast 5 o'clock position 3 cm from nipple.  There is a 3.8 x 1.0 x 1.3 cm lobular hypoechoic mass right breast 5 o'clock position 2 cm from nipple.  Multiple thickened right axillary lymph nodes (approximately 15). Measuring up to 1.0 cm in thickness. Adenopathy corresponds with axillary palpable abnormality.  Indeterminate round and punctate calcifications upper-outer quadrant right  breast.Indeterminate round and punctate calcifications upper-outer quadrant right breast.   05/21/2018 Initial Biopsy   Diagnosis 05/21/18  1. Breast, right, needle core biopsy, axilla, 5 o'clock, ribbon clip - INVASIVE MAMMARY CARCINOMA. - MAMMARY CARCINOMA IN SITU. 2. Breast, right, needle core biopsy, 7 o'clock, coil clip - INVASIVE MAMMARY CARCINOMA. - MAMMARY CARCINOMA IN SITU. 3. Lymph node, needle/core biopsy, right axillary LN, hydromark - METASTATIC MAMMARY CARCINOMA.    05/21/2018 Receptors her2   1. PROGNOSTIC INDICATORS Results: The tumor cells are EQUIVOCAL for Her2 (2+);  HER2 **NEGATIVE** by FISH   Estrogen Receptor: 80%, POSITIVE, MODERATE STAINING INTENSITY Progesterone Receptor: 5%, POSITIVE, MODERATE-WEAK STAINING INTENSITY Proliferation Marker Ki67: 10%  3. PROGNOSTIC INDICATORS Results: The tumor cells are POSITIVE for Her2 (3+). Estrogen Receptor: 90%, POSITIVE, STRONG STAINING INTENSITY Progesterone Receptor: 50%, POSITIVE, MODERATE STAINING INTENSITY Proliferation Marker Ki67: 5%   05/26/2018 Initial Diagnosis   Malignant neoplasm of lower-inner quadrant of right breast of female, estrogen receptor positive ([place])   05/27/2018 Mammogram   Left breast mammogram 05/27/18  IMPRESSION: No mammographic evidence of LEFT breast malignancy.   05/28/2018 Cancer Staging   Staging form: Breast, AJCC 8th Edition - Clinical stage from 05/28/2018: Stage IB (cT2, cN1, cM0, G3, ER+, PR+, HER2+) - Signed by [name], MD on 05/28/2018   06/03/2018 Imaging   MR BREAST BILATERAL W WO CONTRAST INC CAD  IMPRESSION: 1. Large enhancing masses and non mass enhancement identified throughout the majority of the right breast. Additionally, there are smaller masses identified within the upper inner and lower inner right breast. Overall findings are concerning for extensive malignancy throughout all 4 quadrants of the right  breast. 2. Extensive bulky lymphadenopathy identified within  the right axilla. The matted appearance makes counting the number of abnormal lymph nodes difficult as they are immediately approximated to each other. 3. At least one abnormal appearing right internal mammary lymph node. 4. Nonenhancing lesion within the sternum with associated sternal marrow heterogeneity, raising the possibility of osseous metastatic disease within the sternum. 5. Two enhancing masses within the left breast as described above.     06/05/2018 PET scan   NM PET Image Initial (PI) Skull Base To Thigh  IMPRESSION: 1. Multiple borderline enlarged right axillary lymph nodes identified exhibiting mild to moderate increased uptake. Can not rule out nodal metastasis. Correlation with biopsy recommended. 2. Asymmetric increased uptake within the right breast corresponding to MRI abnormality. 3. Subcentimeter right retropectoral and right internal mammary lymph nodes are again identified and exhibit nonspecific, low level FDG uptake. 4. Noncalcified solid nodule within the lateral right lower lobe measures 9 mm without significant radiotracer uptake. 5. No hypermetabolic osseous lesions identified.     06/09/2018 - 11/03/2018 Chemotherapy   Neoadjuvant chemotherapy TCHP, 06/09/2018-09/22/18. Followed by Maintenance Herceptin/Perjeta q3weeks starting 10/14/18 to continue 1 year treatment. Stopped Herceptin/Perjeta on 11/03/18 due to multiple Positive LN of pathology.    06/10/2018 Pathology Results   Surgical pathology  Diagnosis 1. Breast, left, needle core biopsy, LOQ, barbell - MILD FIBROCYSTIC CHANGES. 2. Breast, left, needle core biopsy, UIQ cylinder - MILD FIBROCYSTIC CHANGES.    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 There were no deleterious mutations in AIP, ALK, APC, ATM, AXIN2,BAP1,  BARD1,  BLM, BMPR1A, BRCA1, BRCA2, BRIP1, CASR, CDC73, CDH1, CDK4, CDKN1B, CDKN1C, CDKN2A (p14ARF), CDKN2A (p16INK4a), CEBPA, CHEK2, CTNNA1, DICER1, DIS3L2, EGFR (c.2369C>T, p.Thr790Met variant only), EPCAM (Deletion/duplication testing only), FH, FLCN, GATA2, GPC3, GREM1 (Promoter region deletion/duplication testing only), HOXB13 (c.251G>A, p.Gly84Glu), HRAS, KIT, MAX, MEN1, MET, MITF (c.952G>A, p.Glu318Lys variant only), MLH1, MSH2, MSH3, MSH6, MUTYH, NBN, NF1, NF2, NTHL1, PDGFRA, PHOX2B, PMS2, POLD1, POLE, POT1, PRKAR1A, PTCH1, PTEN, RAD50, RAD51C, RAD51D, RB1, RECQL4, RET, RUNX1, SDHAF2, SDHA (sequence changes only), SDHB, SDHC, SDHD, SMAD4, SMARCA4, SMARCB1, SMARCE1, STK11, SUFU, TERC, TERT, TMEM127, TP53, TSC1, TSC2, VHL, WRN and WT1.  .   09/24/2018 Breast MRI   Breast [facility] from [facility] on 09/24/18 Right breast: Signal void inferior right breast, posterior depth from biopsy marker clip. Interval decreased size of the right breast, and now symmetric to the left, compared to prior MRI. Resolution of abnormal nonmass enhancement without evidence of residual enhancing mass. No evidence of axillary or internal mammary lymphadenopathy is seen, markedly improved from prior. There is no abnormal skin, nipple, or pectoralis muscle enhancement.  Left breast: There are no suspicious enhancing masses or areas of non-mass enhancement. No evidence of axillary or internal mammary lymphadenopathy is seen. There is no abnormal skin, nipple, or pectoralis muscle enhancement.   10/09/2018 Imaging   CT chest W Contrast 10/09/18  IMPRESSION: 1. Stable 10 mm right lower lobe pulmonary nodule since 06/05/2018. Nodule is not substantially hypermetabolic on the PET-CT. Continued attention on follow-up recommended. 2. No findings to suggest metastatic disease in the thorax.   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 and surrounding tissue - Biopsy clips identified - Margin status (see extended anterior margin below)  Invasive carcinoma: Positive anterior-inferior margin  Ductal carcinoma in situ: Positive anterior-inferior margin - Ancillary studies previously reported on [id number](5:00, ribbon clip) Estrogen receptor: Positive (80%) Progesterone receptor: Positive (10%) HER2 IHC: Equivocal (2+) HER2 FISH: Negative HER2/CEP17 ratio: 1.35 Mean HER2 copy number: 1.55 - Ancillary studies previously reported on [id number] (right axillary lymph node) Estrogen receptor: Positive (90%) Progesterone receptor: Positive (20%) HER2 IHC: Positive (3+) - Residual Cancer Burden  Tumor bed size: 41 mm x 20 mm Overall tumor cellularity: 10% Percentage in situ: 60% Number of involved lymph nodes: 7 Diameter of largest metastasis: 6 mm Residual Cancer Burden Score: 3.108 Residual Cancer Burden Class: RCB-II - Other findings: Atypical lobular hyperplasia and columnar cell change with microcalcifications; fibroadenomas with microcalcifications  B: Right axilla, targeted lymph node excision - Six of seven lymph nodes positive for carcinoma (6/7) - Size of largest tumor deposit: 6 mm - Biopsy site identified - Treatment effect: Present - Extracapsular extension: Absent  C: Right axilla, lymph node dissection - One of seven lymph nodes positive for carcinoma, macrometastasis, 6 mm (1/7) - Treatment effect: Absent - Extracapsular extension: Absent  D: Right breast, nipple core biopsy - Negative for carcinoma  E: Breast, right, anterior margin, excision - Microscopic foci of ductal carcinoma in situ - Margin: Negative, <1 mm    11/05/2018 Cancer Staging   Staging form: Breast, AJCC 8th Edition -  Pathologic stage from 11/05/2018: No Stage Recommended (ypT1c, pN2a, cM0, G2, ER+, PR+, HER2-) - Signed by [name], MD on 11/23/2018   11/24/2018 -  Chemotherapy   Due to positive LN on surgical pathology her maintance Herceptin/Perjeta was changed to [place] for about 14 cycles on 11/24/18   12/24/2018 - 02/03/2019 Radiation Therapy   adjuvant radiation at [facility] through [facility] on 12/24/18 and completed on 02/03/19.    01/23/2019 Imaging   CT Chest wo  IMPRESSION: 1. Stable right lower lobe pulmonary nodule. 2. No signs of new or suspicious finding since study of 10/09/2018, now status post right mastectomy, axillary dissection and breast reconstruction.   02/16/2019 -  Anti-estrogen oral therapy   -Monthly Zoladex injection started 12/19/18 -Letrozole 2.5 mg started 02/16/19    04/20/2019 Survivorship   SCP delivered by Cira Rue, NP      INTERVAL HISTORY:  Ms. Hasley presents to the [facility] today for our initial meeting to review her survivorship care plan detailing her treatment course for breast cancer, as well as monitoring long-term side effects of that treatment, education regarding health maintenance, screening, and overall wellness and health promotion.     Overall, Ms. Ulloa is doing well. She continues Kadcyla q3 weeks. She is fatigued but remains active and functional. She has moderate hot flashes from letrozole. She started zoloft 10 days ago. She is sleeping better, not sure how it's affecting hot flashes yet. Has some right shoulder stiffness depending on activity level, denies joint pain. She has constipation worse after kadcyla then improves; denies n/v/d. Her skin healed from radiation. No recent fever, chills, cough, chest pain, dyspnea, leg swelling, neuropathy.     ONCOLOGY TREATMENT TEAM:  1. Surgeon:  Dr. Ernst Bowler at [facility] 2. Medical Oncologist: Dr. Burr Medico 3. Radiation Oncologist: Dr. Lillia Carmel at [place]:  Past  Medical History:  Diagnosis Date  . Acid reflux   . Acute bronchitis   . Anemia   . Asthma   . Family history of brain cancer   . Family history of breast cancer   . Family history of prostate cancer   . Migraine    Past Surgical History:  Procedure Laterality Date  . BREAST BIOPSY Right 05/23/2018   x3, malignant  . DILATION AND CURETTAGE OF UTERUS     2006 x1, 2009 x2  . LAPAROTOMY  2008  . PORTACATH PLACEMENT Left 06/06/2018   Procedure: INSERTION PORT-A-CATH POSSIBLE ULTRASOUND;  Surgeon: [name], MD;  Location: [facility];  Service: General;  Laterality: Left;     ALLERGIES:  No Known Allergies   CURRENT MEDICATIONS:  Outpatient Encounter Medications as of 04/20/2019  Medication Sig  . albuterol (PROVENTIL HFA;VENTOLIN HFA) 108 (90 BASE) MCG/ACT inhaler Inhale 2 puffs into the lungs every 6 (six) hours as needed for wheezing or shortness of breath.  . ALPRAZolam (XANAX) 0.5 MG tablet Take 0.25-0.5 mg by mouth at bedtime as needed for anxiety.   Haze Justin Probiotic (BIOGAIA PROBIOTIC) LIQD Take by mouth daily at 8 pm.  . diphenhydrAMINE (BENADRYL) 25 mg capsule Take 25 mg by mouth at bedtime as needed for sleep.  Marland Kitchen ibuprofen (ADVIL) 200 MG tablet Take 400 mg by mouth every 6 (six) hours as needed for headache or moderate pain.  Marland Kitchen letrozole (FEMARA) 2.5 MG tablet Take 1 tablet (2.5 mg total) by mouth daily.  . Melatonin 3 MG CAPS Take 3 mg by mouth at bedtime.  . Polyethylene Glycol 3350 (MIRALAX PO) Take 17 g by mouth daily as needed.   . propranolol (INDERAL) 10 MG tablet Take 5 mg by mouth daily as needed (for shaking hands).   . sertraline (ZOLOFT) 50 MG tablet Take 1 tablet (50 mg total) by mouth daily.  Marland Kitchen zolpidem (AMBIEN) 5 MG tablet Take 5 mg by mouth at bedtime as needed.  . [DISCONTINUED] sodium chloride flush (NS) 0.9 % injection 10 mL    No facility-administered encounter medications on file as of 04/20/2019.     ONCOLOGIC FAMILY HISTORY:   Family History  Problem Relation Age of Onset  . Breast cancer Mother   . Cancer Mother   . Hyperlipidemia Father   . Cancer Sister 49       Astrocytoma and Meningioma  . Cancer Maternal Aunt        pitutory cancer  . Breast cancer Maternal Great-grandmother 99       MGFs mother  . Prostate cancer Maternal Grandfather 71  . Prostate cancer Maternal Uncle 58  . Skin cancer Maternal Grandmother   . Cancer - Other Other        pituitary, MGMs sister, died in her 24s  . 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  SOCIAL HISTORY:  Caleen Taaffe is married and lives with her spouse/family in [place].  She has 3 children.  Ms. Dorough denies any current or history of tobacco, alcohol, or illicit drug use.     PHYSICAL EXAMINATION:  Vital Signs:   Vitals:   04/20/19 0954  BP: (!) 92/58  Pulse: 60  Resp: 17  Temp: 98 F (36.7 C)  SpO2: 100%   Filed Weights  04/20/19 0954  Weight: 135 lb 12.8 oz (61.6 kg)   General: Well-nourished, well-appearing female in no acute distress.   HEENT:   Sclerae anicteric.   Lymph: No cervical, supraclavicular, or infraclavicular lymphadenopathy noted on palpation.  Cardiovascular: Regular rate and rhythm.Marland Kitchen Respiratory: Clear to auscultation bilaterally. breathing non-labored.  GI: Abdomen soft and flat; non-tender, non-distended. Bowel sounds normoactive.  Neuro: No focal deficits. Steady gait.  Psych: Mood and affect normal and appropriate for situation.  Extremities: No edema. MSK:  Full range of motion in bilateral upper extremities Skin: Warm and dry. Breast: s/p right mastectomy and reconstruction. Incisions well healed. Mild hyperpigmentation throughout the breast. No palpable mass in either breast, chest wall, or  axilla that I could appreciate  LABORATORY DATA:  CBC Latest Ref Rng & Units 04/20/2019 03/30/2019 03/09/2019  WBC 4.0 - 10.5 K/uL 3.2(L) 2.2(L) 3.1(L)  Hemoglobin 12.0 - 15.0 g/dL 10.4(L) 10.2(L) 10.7(L)  Hematocrit 36.0 - 46.0 % 31.8(L) 31.2(L) 33.5(L)  Platelets 150 - 400 K/uL 140(L) 126(L) 161   CMP Latest Ref Rng & Units 04/20/2019 03/30/2019 03/09/2019  Glucose 70 - 99 mg/dL 83 96 88  BUN 6 - 20 mg/dL _0 Creatinine 0.44 - 1.00 mg/dL 0.72 0.74 0.72  Sodium 135 - 145 mmol/L 142 142 142  Potassium 3.5 - 5.1 mmol/L 3.9 4.1 4.1  Chloride 98 - 111 mmol/L 106 105 104  CO2 22 - 32 mmol/L _1 Calcium 8.9 - 10.3 mg/dL 9.0 9.0 9.4  Total Protein 6.5 - 8.1 g/dL 7.2 6.6 7.3  Total Bilirubin 0.3 - 1.2 mg/dL 0.4 0.4 0.4  Alkaline Phos 38 - 126 U/L 96 92 108  AST 15 - 41 U/L 12(L) 13(L) 15  ALT 0 - 44 U/L _2 DIAGNOSTIC IMAGING:  None for this visit.      ASSESSMENT AND PLAN:  Ms.. Hubble is a pleasant 44 y.o. 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  She presents to the Survivorship Clinic for our initial meeting and routine follow-up post-completion of treatment for breast cancer.    1. ypT1cN2a right breast cancer:  Ms. Sublette is continuing to recover from definitive treatment for breast cancer, she continues adjuvant Kadcyla (plan for 14 cycles) which she tolerates well except mild fatigue and constipation. She will follow-up with her medical oncologist, Dr. Burr Medico in 3 weeks with next infusion. She will continue her anti-estrogen therapy with monthly zoladex and daily letrozole. Thus far, she is tolerating moderately well except hot flashes. she recently began zoloft for vasomotor symptoms, will monitor closely. She was instructed to make Dr. Burr Medico or  myself aware if she begins to experience any worsening side effects of the medication and I could see her back in clinic to help manage those side effects, as needed. Her breast exam is benign today. She will be due for left side screening mammogram which is scheduled at [facility] end of 04/2019, she will then discuss prophylactic left mastectomy with her surgeon Dr. Ernst Bowler. There is no role for right mammogram after mastectomy.   Today, a comprehensive survivorship care plan and treatment summary was reviewed with the patient today detailing her breast cancer diagnosis, treatment course, potential late/long-term effects of treatment, appropriate follow-up care with recommendations for the future, and patient education resources.  A copy  of this summary, along with a letter will be sent to the patient's primary care provider via In Basket message after today's visit.    3. Bone health:  Given Ms. Funnell's age/history of breast cancer and her current treatment regimen including anti-estrogen therapy with zoladex and letrozole, she is at risk for bone demineralization. We will obtain a baseline DEXA scan in the near future.  In the meantime, she was encouraged to increase her consumption of foods rich in calcium, as well as increase her weight-bearing activities.  She was given education on specific activities to promote bone health. Her vitamin D level is borderline low today at 32, I recommend for her to take 1000- 2000 IU daily. Will repeat periodically.  4. Cancer screening:  Due to Ms. Messinger's history and her age, she should receive screening for skin cancers, colon cancer, and gynecologic cancers.  The information and recommendations are listed on the patient's comprehensive care plan/treatment summary and were reviewed in detail with the patient. Due to her PALB2 gene mutation, she is considering left prophylactic mastectomy and future BSO in 2021. Given she does not have strong family history of colon  cancer, it is reasonable to defer colonoscopy for short term after she has other surgeries this year. She is aware the new recommendation for colon cancer screening is now 67, previously 27. We reviewed signs that she would need one urgently such as unexplained change in bowel habits or rectal bleeding. She understands.   5. Health maintenance and wellness promotion: Ms. Deeley was encouraged to consume 5-7 servings of fruits and vegetables per day. We reviewed the "Nutrition Rainbow" handout, as well as the handout "Take Control of Your Health and Reduce Your Cancer Risk" from the [facility].  She was also encouraged to engage in moderate to vigorous exercise for 30 minutes per day most days of the week. We discussed the [employer] fitness program, which is designed for cancer survivors to help them become more physically fit after cancer treatments.  She was instructed to limit her alcohol consumption and continue to abstain from tobacco use.     6. Support services/counseling: It is not uncommon for this period of the patient's cancer care trajectory to be one of many emotions and stressors.  We discussed an opportunity for her to participate in the next session of [facility] ("Finding Your New Normal") support group series designed for patients after they have completed treatment.   Ms. Sailer was encouraged to take advantage of our many other support services programs, support groups, and/or counseling in coping with her new life as a cancer survivor after completing anti-cancer treatment.  She was offered support today through active listening and expressive supportive counseling.  She was given information regarding our available services and encouraged to contact me with any questions or for help enrolling in any of our support group/programs.    Dispo:   -Return to cancer center q3 weeks for adjuvant Kadcyla for total 14 cycles  -Continue echo and f/u cardiology while on anti-HER2  therapy  -Left side mammogram in 04/2019 at [facility] -Follow up with surgery 04/2019 to discuss prophylactic mastectomy  -baseline DEXA in 1-3 months  -begin vitamin D 1000-2000 IU daily  -She is welcome to return back to the Survivorship Clinic at any time; no additional follow-up needed at this time.  -Consider referral back to survivorship as a long-term survivor for continued surveillance  A total of (30) minutes of face-to-face time was spent with  this patient with greater than 50% of that time in counseling and care-coordination.   Cira Rue, NP Survivorship Program [facility] [phone]   Note: PRIMARY CARE PROVIDER [name], [place] [phone]

## 2019-04-20 NOTE — Patient Instructions (Signed)
[facility] Discharge Instructions for Patients Receiving Chemotherapy  Today you received the following chemotherapy agents: ado-trastuzumab emtansine.  To help prevent nausea and vomiting after your treatment, we encourage you to take your nausea medication as directed.   If you develop nausea and vomiting that is not controlled by your nausea medication, call the clinic.   BELOW ARE SYMPTOMS THAT SHOULD BE REPORTED IMMEDIATELY:  *FEVER GREATER THAN 100.5 F  *CHILLS WITH OR WITHOUT FEVER  NAUSEA AND VOMITING THAT IS NOT CONTROLLED WITH YOUR NAUSEA MEDICATION  *UNUSUAL SHORTNESS OF BREATH  *UNUSUAL BRUISING OR BLEEDING  TENDERNESS IN MOUTH AND THROAT WITH OR WITHOUT PRESENCE OF ULCERS  *URINARY PROBLEMS  *BOWEL PROBLEMS  UNUSUAL RASH Items with * indicate a potential emergency and should be followed up as soon as possible.  Feel free to call the clinic should you have any questions or concerns. The clinic phone number is [phone].  Please show the CHEMO ALERT CARD at check-in to the Emergency Department and triage nurse.   

## 2019-04-20 NOTE — Patient Instructions (Signed)

## 2019-04-21 ENCOUNTER — Telehealth: Payer: Self-pay | Admitting: Hematology

## 2019-04-21 ENCOUNTER — Encounter: Payer: Self-pay | Admitting: Nurse Practitioner

## 2019-04-21 ENCOUNTER — Telehealth: Payer: Self-pay | Admitting: *Deleted

## 2019-04-21 NOTE — Telephone Encounter (Signed)
Scheduled appt per 1/5 los.  Pt will get a print out at the next scheduled appt.

## 2019-04-21 NOTE — Telephone Encounter (Signed)
Per Cira Rue, NP called to notify pt that injection appt is cancelled tomorrow due to normal ANC. Also there are 14 cycles of Kadcyla based on clinical trial data. Pt verbalized understanding.

## 2019-04-22 ENCOUNTER — Ambulatory Visit: Payer: BC Managed Care – PPO

## 2019-05-01 ENCOUNTER — Other Ambulatory Visit: Payer: Self-pay | Admitting: Hematology

## 2019-05-06 NOTE — Progress Notes (Signed)
[place]   [phone] [phone]   Clinic Follow up Note   Patient Care Team: [name], MD as PCP - General (Family Medicine) [name], MD as Consulting Physician (General Surgery) [name], MD as Consulting Physician (Hematology) [name], MD as Consulting Physician (Radiation Oncology) [name], MD as Consulting Physician (Obstetrics and Gynecology) [name], RN as Oncology Nurse Navigator [name], RN as Oncology Nurse Navigator [name], NP as Nurse Practitioner (Nurse Practitioner)  Date of Service:  05/11/2019  CHIEF COMPLAINT: F/u multifocal right breast cancer  SUMMARY OF ONCOLOGIC HISTORY: Oncology History Overview Note  Cancer Staging Cancer of overlapping sites of right female breast [facility]) Staging form: Breast, AJCC 8th Edition - Clinical stage from 05/28/2018: Stage IB (cT2, cN1, cM0, G3, ER+, PR+, HER2+) - Signed by [name], MD on 05/28/2018  Malignant neoplasm of lower-outer quadrant of right breast of female, estrogen receptor positive ([place]) Staging form: Breast, AJCC 8th Edition - Clinical stage from 05/28/2018: Stage IIB (cT2, cN1, cM0, G3, ER+, PR+, HER2-) - Unsigned     Cancer of overlapping sites of right female breast ([place])  05/14/2018 Mammogram   Right breast mammogram 05/14/18  IMPRESSION  There is a 1.0 x 0.5 x 1.2 cm lobular hypoechoic mass right breast 7 o'clock position 3 cm from nipple, a 0.8 x 0.6 x 1.2 cm lobular hypoechoic mass right breast 7 o'clock position 2 cm from the nipple, and a 0.9 x 0.5 x 0.8 cm lobular hypoechoic mass right breast 5 o'clock position 3 cm from nipple.  There is a 3.8 x 1.0 x 1.3 cm lobular hypoechoic mass right breast 5 o'clock position 2 cm from nipple.  Multiple thickened right axillary lymph nodes (approximately 15). Measuring up to 1.0 cm in thickness. Adenopathy corresponds with axillary palpable abnormality.  Indeterminate  round and punctate calcifications upper-outer quadrant right breast.Indeterminate round and punctate calcifications upper-outer quadrant right breast.   05/21/2018 Initial Biopsy   Diagnosis 05/21/18  1. Breast, right, needle core biopsy, axilla, 5 o'clock, ribbon clip - INVASIVE MAMMARY CARCINOMA. - MAMMARY CARCINOMA IN SITU. 2. Breast, right, needle core biopsy, 7 o'clock, coil clip - INVASIVE MAMMARY CARCINOMA. - MAMMARY CARCINOMA IN SITU. 3. Lymph node, needle/core biopsy, right axillary LN, hydromark - METASTATIC MAMMARY CARCINOMA.    05/21/2018 Receptors her2   1. PROGNOSTIC INDICATORS Results: The tumor cells are EQUIVOCAL for Her2 (2+);  HER2 **NEGATIVE** by FISH   Estrogen Receptor: 80%, POSITIVE, MODERATE STAINING INTENSITY Progesterone Receptor: 5%, POSITIVE, MODERATE-WEAK STAINING INTENSITY Proliferation Marker Ki67: 10%  3. PROGNOSTIC INDICATORS Results: The tumor cells are POSITIVE for Her2 (3+). Estrogen Receptor: 90%, POSITIVE, STRONG STAINING INTENSITY Progesterone Receptor: 50%, POSITIVE, MODERATE STAINING INTENSITY Proliferation Marker Ki67: 5%   05/26/2018 Initial Diagnosis   Malignant neoplasm of lower-inner quadrant of right breast of female, estrogen receptor positive ([facility])   05/27/2018 Mammogram   Left breast mammogram 05/27/18  IMPRESSION: No mammographic evidence of LEFT breast malignancy.   05/28/2018 Cancer Staging   Staging form: Breast, AJCC 8th Edition - Clinical stage from 05/28/2018: Stage IB (cT2, cN1, cM0, G3, ER+, PR+, HER2+) - Signed by [name], MD on 05/28/2018   06/03/2018 Imaging   MR BREAST BILATERAL W WO CONTRAST INC CAD  IMPRESSION: 1. Large enhancing masses and non mass enhancement identified throughout the majority of the right breast. Additionally, there are smaller masses identified within the upper inner and lower inner right breast. Overall findings are  concerning for extensive malignancy throughout all 4 quadrants of the right  breast. 2. Extensive bulky lymphadenopathy identified within the right axilla. The matted appearance makes counting the number of abnormal lymph nodes difficult as they are immediately approximated to each other. 3. At least one abnormal appearing right internal mammary lymph node. 4. Nonenhancing lesion within the sternum with associated sternal marrow heterogeneity, raising the possibility of osseous metastatic disease within the sternum. 5. Two enhancing masses within the left breast as described above.     06/05/2018 PET scan   NM PET Image Initial (PI) Skull Base To Thigh  IMPRESSION: 1. Multiple borderline enlarged right axillary lymph nodes identified exhibiting mild to moderate increased uptake. Can not rule out nodal metastasis. Correlation with biopsy recommended. 2. Asymmetric increased uptake within the right breast corresponding to MRI abnormality. 3. Subcentimeter right retropectoral and right internal mammary lymph nodes are again identified and exhibit nonspecific, low level FDG uptake. 4. Noncalcified solid nodule within the lateral right lower lobe measures 9 mm without significant radiotracer uptake. 5. No hypermetabolic osseous lesions identified.     06/09/2018 - 11/03/2018 Chemotherapy   Neoadjuvant chemotherapy TCHP, 06/09/2018-09/22/18. Followed by Maintenance Herceptin/Perjeta q3weeks starting 10/14/18 to continue 1 year treatment. Stopped Herceptin/Perjeta on 11/03/18 due to multiple Positive LN of pathology.    06/10/2018 Pathology Results   Surgical pathology  Diagnosis 1. Breast, left, needle core biopsy, LOQ, barbell - MILD FIBROCYSTIC CHANGES. 2. Breast, left, needle core biopsy, UIQ cylinder - MILD FIBROCYSTIC CHANGES.    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 There were no  deleterious mutations in AIP, ALK, APC, ATM, AXIN2,BAP1,  BARD1, BLM, BMPR1A, BRCA1, BRCA2, BRIP1, CASR, CDC73, CDH1, CDK4, CDKN1B, CDKN1C, CDKN2A (p14ARF), CDKN2A (p16INK4a), CEBPA, CHEK2, CTNNA1, DICER1, DIS3L2, EGFR (c.2369C>T, p.Thr790Met variant only), EPCAM (Deletion/duplication testing only), FH, FLCN, GATA2, GPC3, GREM1 (Promoter region deletion/duplication testing only), HOXB13 (c.251G>A, p.Gly84Glu), HRAS, KIT, MAX, MEN1, MET, MITF (c.952G>A, p.Glu318Lys variant only), MLH1, MSH2, MSH3, MSH6, MUTYH, NBN, NF1, NF2, NTHL1, PDGFRA, PHOX2B, PMS2, POLD1, POLE, POT1, PRKAR1A, PTCH1, PTEN, RAD50, RAD51C, RAD51D, RB1, RECQL4, RET, RUNX1, SDHAF2, SDHA (sequence changes only), SDHB, SDHC, SDHD, SMAD4, SMARCA4, SMARCB1, SMARCE1, STK11, SUFU, TERC, TERT, TMEM127, TP53, TSC1, TSC2, VHL, WRN and WT1.  .   09/24/2018 Breast MRI   Breast MRI from [facility] on 09/24/18 Right breast: Signal void inferior right breast, posterior depth from biopsy marker clip. Interval decreased size of the right breast, and now symmetric to the left, compared to prior MRI. Resolution of abnormal nonmass enhancement without evidence of residual enhancing mass. No evidence of axillary or internal mammary lymphadenopathy is seen, markedly improved from prior. There is no abnormal skin, nipple, or pectoralis muscle enhancement.  Left breast: There are no suspicious enhancing masses or areas of non-mass enhancement. No evidence of axillary or internal mammary lymphadenopathy is seen. There is no abnormal skin, nipple, or pectoralis muscle enhancement.   10/09/2018 Imaging   CT chest W Contrast 10/09/18  IMPRESSION: 1. Stable 10 mm right lower lobe pulmonary nodule since 06/05/2018. Nodule is not substantially hypermetabolic on the PET-CT. Continued attention on follow-up recommended. 2. No findings to suggest metastatic disease in the thorax.   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 surrounding tissue - Biopsy clips identified - Margin status (see extended anterior margin below)  Invasive carcinoma: Positive anterior-inferior margin  Ductal carcinoma in situ: Positive anterior-inferior margin - Ancillary studies previously reported on [id number](5:00, ribbon clip) Estrogen receptor: Positive (80%) Progesterone receptor: Positive (10%) HER2 IHC: Equivocal (2+) HER2 FISH: Negative HER2/CEP17 ratio: 1.35 Mean HER2 copy number: 1.55 - Ancillary studies previously reported on [id number] (right axillary lymph node) Estrogen receptor: Positive (90%) Progesterone receptor: Positive (20%) HER2 IHC: Positive (3+) - Residual Cancer Burden  Tumor bed size: 41 mm x 20 mm Overall tumor cellularity: 10% Percentage in situ: 60% Number of involved lymph nodes: 7 Diameter of largest metastasis: 6 mm Residual Cancer Burden Score: 3.108 Residual Cancer Burden Class: RCB-II - Other findings: Atypical lobular hyperplasia and columnar cell change with microcalcifications; fibroadenomas with microcalcifications  B: Right axilla, targeted lymph node excision - Six of seven lymph nodes positive for carcinoma (6/7) - Size of largest tumor deposit: 6 mm - Biopsy site identified - Treatment effect: Present - Extracapsular extension: Absent  C: Right axilla, lymph node dissection - One of seven lymph nodes positive for carcinoma, macrometastasis, 6 mm (1/7) - Treatment effect: Absent - Extracapsular extension: Absent  D: Right breast, nipple core biopsy - Negative for carcinoma  E: Breast, right, anterior margin, excision - Microscopic foci of ductal carcinoma in situ - Margin: Negative, <1 mm    11/05/2018  Cancer Staging   Staging form: Breast, AJCC 8th Edition - Pathologic stage from 11/05/2018: No Stage Recommended (ypT1c, pN2a, cM0, G2, ER+, PR+, HER2-) - Signed by [name], MD on 11/23/2018   11/24/2018 -  Chemotherapy   Due to positive LN on surgical pathology her maintance Herceptin/Perjeta was changed to [place] for about 14 cycles on 11/24/18   12/24/2018 - 02/03/2019 Radiation Therapy   adjuvant radiation at [facility] through [facility] on 12/24/18 and completed on 02/03/19.    01/23/2019 Imaging   CT Chest wo  IMPRESSION: 1. Stable right lower lobe pulmonary nodule. 2. No signs of new or suspicious finding since study of 10/09/2018, now status post right mastectomy, axillary dissection and breast reconstruction.   02/16/2019 -  Anti-estrogen oral therapy   -Monthly Zoladex injection started 12/19/18 -Letrozole 2.5 mg started 02/16/19    04/20/2019 Survivorship   SCP delivered by Cira Rue, NP       CURRENT THERAPY:  -Due to positive LN on surgical pathology hermaintenanceHerceptin/Perjeta was changed to Kadcyla q3weeks for about 14 cycles on 11/24/18 -Monthly Zoladex injection started 12/19/18 -Letrozole 2.5 mg started 02/16/19  INTERVAL HISTORY:  Kim [name] is here for a follow up and treatment. She presents to the clinic alone. She notes she is doing well.      REVIEW OF SYSTEMS:   Constitutional: Denies fevers, chills or abnormal weight loss Eyes: Denies blurriness of vision Ears, nose, mouth, throat, and face: Denies mucositis or sore throat Respiratory: Denies cough, dyspnea or wheezes Cardiovascular: Denies palpitation, chest discomfort or lower extremity swelling Gastrointestinal:  Denies nausea, heartburn or change in bowel habits Skin: Denies abnormal skin rashes Lymphatics: Denies new lymphadenopathy or easy bruising Neurological:Denies numbness, tingling or new weaknesses Behavioral/Psych: Mood is stable, no new changes  All other systems were reviewed  with the patient and are negative.  MEDICAL HISTORY:  Past Medical History:  Diagnosis Date  . Acid reflux   . Acute bronchitis   . Anemia   . Asthma   . Family history of brain cancer   . Family history of breast cancer   . Family history of prostate cancer   . Migraine     SURGICAL HISTORY: Past Surgical History:  Procedure Laterality Date  . BREAST BIOPSY Right 05/23/2018   x3, malignant  . DILATION AND CURETTAGE OF UTERUS     2006 x1, 2009 x2  . LAPAROTOMY  2008  . PORTACATH PLACEMENT Left 06/06/2018   Procedure: INSERTION PORT-A-CATH POSSIBLE ULTRASOUND;  Surgeon: [name], MD;  Location: [facility];  Service: General;  Laterality: Left;    I have reviewed the social history and family history with the patient and they are unchanged from previous note.  ALLERGIES:  has No Known Allergies.  MEDICATIONS:  Current Outpatient Medications  Medication Sig Dispense Refill  . albuterol (PROVENTIL HFA;VENTOLIN HFA) 108 (90 BASE) MCG/ACT inhaler Inhale 2 puffs into the lungs every 6 (six) hours as needed for wheezing or shortness of breath.    . ALPRAZolam (XANAX) 0.5 MG tablet Take 0.25-0.5 mg by mouth at bedtime as needed for anxiety.     Haze Justin Probiotic (BIOGAIA PROBIOTIC) LIQD Take by mouth daily at 8 pm.    . diphenhydrAMINE (BENADRYL) 25 mg capsule Take 25 mg by mouth at bedtime as needed for sleep.    Marland Kitchen ibuprofen (ADVIL) 200 MG tablet Take 400 mg by mouth every 6 (six) hours as needed for headache or moderate pain.    Marland Kitchen letrozole (FEMARA) 2.5 MG tablet Take 1 tablet (2.5 mg total) by mouth daily. 30 tablet 3  . Melatonin 3 MG CAPS Take 3 mg by mouth at bedtime.    . Polyethylene Glycol 3350 (MIRALAX PO) Take 17 g by mouth daily as needed.     . propranolol (INDERAL) 10 MG tablet Take 5 mg by mouth daily as needed (for shaking hands).     . sertraline (ZOLOFT) 50 MG tablet TAKE 1 TABLET BY MOUTH EVERY DAY 90 tablet 1  . zolpidem (AMBIEN) 5 MG  tablet Take 5 mg by mouth at bedtime as needed.     No current facility-administered medications for this visit.   Facility-Administered Medications Ordered in Other Visits  Medication Dose Route Frequency Provider Last Rate Last Admin  . sodium chloride flush (NS) 0.9 % injection 10 mL  10 mL Intracatheter PRN [name], MD   10 mL at 05/11/19 1129    PHYSICAL EXAMINATION: ECOG PERFORMANCE STATUS: 0 - Asymptomatic  Vitals with BMI 05/11/2019  Height 5'9"  Weight 139 lbs  BMI 25.36  Systolic 644  Diastolic 65  Pulse 68    Due to COVID19 we will limit examination to appearance. Patient had no complaints.  GENERAL:alert, no distress and comfortable SKIN: skin color normal, no rashes or significant lesions EYES: normal, Conjunctiva are pink and non-injected, sclera clear  NEURO: alert & oriented x 3 with fluent speech   LABORATORY DATA:  I have reviewed the data as listed CBC Latest Ref Rng & Units 05/11/2019 04/20/2019 03/30/2019  WBC 4.0 - 10.5 K/uL 2.2(L) 3.2(L) 2.2(L)  Hemoglobin 12.0 - 15.0 g/dL 10.2(L) 10.4(L) 10.2(L)  Hematocrit 36.0 - 46.0 % 31.6(L) 31.8(L) 31.2(L)  Platelets 150 - 400 K/uL 105(L) 140(L) 126(L)     CMP Latest Ref Rng & Units 05/11/2019 04/20/2019 03/30/2019  Glucose 70 - 99  mg/dL 84 83 96  BUN 6 - 20 mg/dL '14 12 13  '$ Creatinine 0.44 - 1.00 mg/dL 0.72 0.72 0.74  Sodium 135 - 145 mmol/L 141 142 142  Potassium 3.5 - 5.1 mmol/L 4.0 3.9 4.1  Chloride 98 - 111 mmol/L 106 106 105  CO2 22 - 32 mmol/L '28 27 29  '$ Calcium 8.9 - 10.3 mg/dL 9.1 9.0 9.0  Total Protein 6.5 - 8.1 g/dL 6.9 7.2 6.6  Total Bilirubin 0.3 - 1.2 mg/dL 0.4 0.4 0.4  Alkaline Phos 38 - 126 U/L 88 96 92  AST 15 - 41 U/L 14(L) 12(L) 13(L)  ALT 0 - 44 U/L '17 17 16      '$ RADIOGRAPHIC STUDIES: I have personally reviewed the radiological images as listed and agreed with the findings in the report. No results found.   ASSESSMENT & PLAN:  Tyaisha Cullom is a 44 y.o. 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 [facility]  -She plans to doprophylactic left mastectomy and b/lreconstructionsurgeryin 06/2019. -Given her ER/PR positive disease I started her on antiestrogen therapy with monthly Zoladex injection in 12/2018 and on Letrozole in 02/2019. Tolerating well with manageable hot flashes and mood swings, which has improved with Zoloft  -Due to positive LNs she is on adjuvant Kadcyla q3weeks to complete 1 year treatment due to her high risk disease, last dose in May.  -She is clinically doing well overall. She continues to tolerate her treatment with Kadcyla. Labs reviewed, and adequate to proceed with C9 Kadcyla today. Will give Udenyca with this cycle. Continue every 3 weeks, plan to complete in May.  -Will proceed with Zoladex infusion today and continue every 4 weeks  -Continue Letrozole daily.  -F/u in 9 weeks.    2.Genetics:PALB42mtation+ -Genetic testingfrom [facility] done 05/2018 was positive for PALB261mation. She has discovered a stronger maternal family history of prostate and breast cancerin her 1st, 2nd and 3rd degree relatives, Including her mother who was recently diagnosed.  -We discussed the risk of future breast cancer from PALB2m59mtion. Given her family history, she plans to doprophylactic left mastectomy and b/lreconstructionsurgeryin 06/2019. -While there is an increased risk for other cancers, there are no current guidelines for screening for these cancers, and at this time NCCN does not suggest a discussion of risk reducing salpingo-oophorectomy. -She has talked to GYN about her BSO  and Hysterectomy in 2021. I recommend she wait until she completes Kadcyla.   3. Asthma  -mild, continuealbuterol inhaler as needed -I previously discussed she is at high risk for infection. If she develops fever or productive cough with phlegm she can contact our clinic. 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   5. 9mm94mL lung nodule  -Her initial 06/05/18 PET showed a 9 mm solid rounded nodule within the lateral right lower lobe, not hypermetabolic -Her attempted lung biopsy was unsuccessful. Will monitor withfollow upCT scanin 3 months -Stable on 01/23/19 CT chest. Will continue to monitor for 2 years. -repeat CT chest in Feb or March 2021  6. Insomnia, Stress/Anxiety -She has been having insomnia issue since cancer diagnosis, she notes benadryl has not worked and she has  been on melatonin for years.  -She currently uses half to whole Ambien or half xanax to get her to sleep.  -She does have mood swings and hot flashes from Letrozole. She feels she is managing her mood and any anxiety/depression well with exercise, coping tools and she has a good support system. But feels at night or napping her stress and mood get to her.  -To help her mood, sleep and hot flashes, I called in SSRI Zoloft (03/30/19). She responded well and her sleep has improved.    7. Bone Health  -I reviewed AI such as letrozole can weaken her bone  -I recommend she start Vitrain D OTC daily. Will monitor her Vit D Level.  -Will proceed with baseline DEXA on 07/21/19.    Plan -Labs reviewed and adequate to proceed with C9 Kadcyla today and continue every 3 weeks  -Udenyca late tomorrow, and will give every other treatment if ANC<1.5  -Continue Letrozole  -Zoladex injection today and every 4 weeks.  -Lab, flush and Kadcyla in 3, 6, 9 weeks -f/u  in 9 weeks with a CT chest before next visit    No problem-specific Assessment & Plan notes found for this encounter.   Orders Placed This Encounter  Procedures  . CT Chest Wo Contrast    Standing Status:   Future    Standing Expiration Date:   05/10/2020    Order Specific Question:   Is patient pregnant?    Answer:   No    Order Specific Question:   Preferred imaging location?    Answer:   [facility]    Order Specific Question:   Radiology Contrast Protocol - do NOT remove file path    Answer:   [email or web address]   All questions were answered. The patient knows to call the clinic with any problems, questions or concerns. No barriers to learning was detected. The total time spent in the appointment was 25 minutes.     [name], MD 05/11/2019   I, Joslyn Devon, am acting as scribe for [name], MD.   I have reviewed the above documentation for accuracy and completeness, and I agree with the above.

## 2019-05-11 ENCOUNTER — Inpatient Hospital Stay: Payer: BC Managed Care – PPO

## 2019-05-11 ENCOUNTER — Encounter: Payer: Self-pay | Admitting: Hematology

## 2019-05-11 ENCOUNTER — Inpatient Hospital Stay: Payer: BC Managed Care – PPO | Admitting: Hematology

## 2019-05-11 ENCOUNTER — Other Ambulatory Visit: Payer: Self-pay

## 2019-05-11 VITALS — BP 115/65 | HR 68 | Temp 97.8°F | Resp 17 | Wt 139.0 lb

## 2019-05-11 DIAGNOSIS — R911 Solitary pulmonary nodule: Secondary | ICD-10-CM | POA: Diagnosis not present

## 2019-05-11 DIAGNOSIS — Z170 Estrogen receptor positive status [ER+]: Secondary | ICD-10-CM

## 2019-05-11 DIAGNOSIS — Z95828 Presence of other vascular implants and grafts: Secondary | ICD-10-CM

## 2019-05-11 DIAGNOSIS — C50811 Malignant neoplasm of overlapping sites of right female breast: Secondary | ICD-10-CM

## 2019-05-11 DIAGNOSIS — Z5112 Encounter for antineoplastic immunotherapy: Secondary | ICD-10-CM | POA: Diagnosis not present

## 2019-05-11 LAB — CBC WITH DIFFERENTIAL (CANCER CENTER ONLY)
Abs Immature Granulocytes: 0 10*3/uL (ref 0.00–0.07)
Basophils Absolute: 0 10*3/uL (ref 0.0–0.1)
Basophils Relative: 2 %
Eosinophils Absolute: 0.1 10*3/uL (ref 0.0–0.5)
Eosinophils Relative: 5 %
HCT: 31.6 % — ABNORMAL LOW (ref 36.0–46.0)
Hemoglobin: 10.2 g/dL — ABNORMAL LOW (ref 12.0–15.0)
Immature Granulocytes: 0 %
Lymphocytes Relative: 27 %
Lymphs Abs: 0.6 10*3/uL — ABNORMAL LOW (ref 0.7–4.0)
MCH: 29.6 pg (ref 26.0–34.0)
MCHC: 32.3 g/dL (ref 30.0–36.0)
MCV: 91.6 fL (ref 80.0–100.0)
Monocytes Absolute: 0.3 10*3/uL (ref 0.1–1.0)
Monocytes Relative: 13 %
Neutro Abs: 1.2 10*3/uL — ABNORMAL LOW (ref 1.7–7.7)
Neutrophils Relative %: 53 %
Platelet Count: 105 10*3/uL — ABNORMAL LOW (ref 150–400)
RBC: 3.45 MIL/uL — ABNORMAL LOW (ref 3.87–5.11)
RDW: 13.9 % (ref 11.5–15.5)
WBC Count: 2.2 10*3/uL — ABNORMAL LOW (ref 4.0–10.5)
nRBC: 0 % (ref 0.0–0.2)

## 2019-05-11 LAB — CMP (CANCER CENTER ONLY)
ALT: 17 U/L (ref 0–44)
AST: 14 U/L — ABNORMAL LOW (ref 15–41)
Albumin: 4.2 g/dL (ref 3.5–5.0)
Alkaline Phosphatase: 88 U/L (ref 38–126)
Anion gap: 7 (ref 5–15)
BUN: 14 mg/dL (ref 6–20)
CO2: 28 mmol/L (ref 22–32)
Calcium: 9.1 mg/dL (ref 8.9–10.3)
Chloride: 106 mmol/L (ref 98–111)
Creatinine: 0.72 mg/dL (ref 0.44–1.00)
GFR, Est AFR Am: >60 mL/min (ref >60)
GFR, Estimated: >60 mL/min (ref >60)
Glucose, Bld: 84 mg/dL (ref 70–99)
Potassium: 4 mmol/L (ref 3.5–5.1)
Sodium: 141 mmol/L (ref 135–145)
Total Bilirubin: 0.4 mg/dL (ref 0.3–1.2)
Total Protein: 6.9 g/dL (ref 6.5–8.1)

## 2019-05-11 MED ORDER — SODIUM CHLORIDE 0.9% FLUSH
10.0000 mL | INTRAVENOUS | Status: DC | PRN
  Administered 2019-05-11: 10 mL
  Filled 2019-05-11: qty 10

## 2019-05-11 MED ORDER — DIPHENHYDRAMINE HCL 25 MG PO CAPS
50.0000 mg | ORAL_CAPSULE | Freq: Once | ORAL | Status: AC
  Administered 2019-05-11: 50 mg via ORAL

## 2019-05-11 MED ORDER — SODIUM CHLORIDE 0.9 % IV SOLN
Freq: Once | INTRAVENOUS | Status: AC
  Filled 2019-05-11: qty 250

## 2019-05-11 MED ORDER — GOSERELIN ACETATE 3.6 MG ~~LOC~~ IMPL
DRUG_IMPLANT | SUBCUTANEOUS | Status: AC
  Filled 2019-05-11: qty 3.6

## 2019-05-11 MED ORDER — SODIUM CHLORIDE 0.9 % IV SOLN
3.6000 mg/kg | Freq: Once | INTRAVENOUS | Status: AC
  Administered 2019-05-11: 220 mg via INTRAVENOUS
  Filled 2019-05-11: qty 5

## 2019-05-11 MED ORDER — ACETAMINOPHEN 325 MG PO TABS
650.0000 mg | ORAL_TABLET | Freq: Once | ORAL | Status: AC
  Administered 2019-05-11: 10:00:00 650 mg via ORAL

## 2019-05-11 MED ORDER — HEPARIN SOD (PORK) LOCK FLUSH 100 UNIT/ML IV SOLN
500.0000 [IU] | Freq: Once | INTRAVENOUS | Status: AC | PRN
  Administered 2019-05-11: 500 [IU]
  Filled 2019-05-11: qty 5

## 2019-05-11 MED ORDER — ACETAMINOPHEN 325 MG PO TABS
ORAL_TABLET | ORAL | Status: AC
  Filled 2019-05-11: qty 2

## 2019-05-11 MED ORDER — GOSERELIN ACETATE 3.6 MG ~~LOC~~ IMPL
3.6000 mg | DRUG_IMPLANT | Freq: Once | SUBCUTANEOUS | Status: AC
  Administered 2019-05-11: 3.6 mg via SUBCUTANEOUS

## 2019-05-11 MED ORDER — DIPHENHYDRAMINE HCL 25 MG PO CAPS
ORAL_CAPSULE | ORAL | Status: AC
  Filled 2019-05-11: qty 2

## 2019-05-11 NOTE — Progress Notes (Signed)
OK to treat with ANC today per MD Gus Height tomorrow

## 2019-05-12 ENCOUNTER — Inpatient Hospital Stay: Payer: BC Managed Care – PPO

## 2019-05-12 ENCOUNTER — Telehealth: Payer: Self-pay | Admitting: Hematology

## 2019-05-12 ENCOUNTER — Encounter: Payer: Self-pay | Admitting: Hematology

## 2019-05-12 ENCOUNTER — Other Ambulatory Visit: Payer: Self-pay

## 2019-05-12 VITALS — BP 118/62

## 2019-05-12 DIAGNOSIS — Z5112 Encounter for antineoplastic immunotherapy: Secondary | ICD-10-CM | POA: Diagnosis not present

## 2019-05-12 DIAGNOSIS — Z95828 Presence of other vascular implants and grafts: Secondary | ICD-10-CM

## 2019-05-12 MED ORDER — PEGFILGRASTIM-CBQV 6 MG/0.6ML ~~LOC~~ SOSY
6.0000 mg | PREFILLED_SYRINGE | Freq: Once | SUBCUTANEOUS | Status: AC
  Administered 2019-05-12: 6 mg via SUBCUTANEOUS

## 2019-05-12 MED ORDER — PEGFILGRASTIM-CBQV 6 MG/0.6ML ~~LOC~~ SOSY
PREFILLED_SYRINGE | SUBCUTANEOUS | Status: AC
  Filled 2019-05-12: qty 0.6

## 2019-05-12 NOTE — Patient Instructions (Signed)

## 2019-05-12 NOTE — Progress Notes (Signed)
Per Juliann Pulse with managed care, okay to to give pegfilgrastim today (and goserelin yesterday) in setting of PA renewal pending. PA team is actively working on it.   Demetrius Charity, PharmD, [facility] Pharmacist Pharmacy Phone: [phone] 05/12/2019

## 2019-05-12 NOTE — Telephone Encounter (Signed)
Scheduled appt per 1/25 los.  Patient will get a print out at her next scheduled appt.

## 2019-05-30 ENCOUNTER — Other Ambulatory Visit: Payer: Self-pay | Admitting: Hematology

## 2019-06-01 ENCOUNTER — Inpatient Hospital Stay: Payer: BC Managed Care – PPO

## 2019-06-01 ENCOUNTER — Other Ambulatory Visit: Payer: Self-pay

## 2019-06-01 ENCOUNTER — Inpatient Hospital Stay: Payer: BC Managed Care – PPO | Attending: Hematology

## 2019-06-01 ENCOUNTER — Ambulatory Visit: Payer: BC Managed Care – PPO | Admitting: Nurse Practitioner

## 2019-06-01 VITALS — BP 107/63 | HR 59 | Temp 98.7°F | Resp 20

## 2019-06-01 DIAGNOSIS — Z5111 Encounter for antineoplastic chemotherapy: Secondary | ICD-10-CM | POA: Insufficient documentation

## 2019-06-01 DIAGNOSIS — Z170 Estrogen receptor positive status [ER+]: Secondary | ICD-10-CM

## 2019-06-01 DIAGNOSIS — Z5112 Encounter for antineoplastic immunotherapy: Secondary | ICD-10-CM | POA: Diagnosis not present

## 2019-06-01 DIAGNOSIS — C50811 Malignant neoplasm of overlapping sites of right female breast: Secondary | ICD-10-CM

## 2019-06-01 DIAGNOSIS — Z95828 Presence of other vascular implants and grafts: Secondary | ICD-10-CM

## 2019-06-01 LAB — CMP (CANCER CENTER ONLY)
ALT: 19 U/L (ref 0–44)
AST: 13 U/L — ABNORMAL LOW (ref 15–41)
Albumin: 4 g/dL (ref 3.5–5.0)
Alkaline Phosphatase: 94 U/L (ref 38–126)
Anion gap: 6 (ref 5–15)
BUN: 14 mg/dL (ref 6–20)
CO2: 28 mmol/L (ref 22–32)
Calcium: 9.2 mg/dL (ref 8.9–10.3)
Chloride: 107 mmol/L (ref 98–111)
Creatinine: 0.73 mg/dL (ref 0.44–1.00)
GFR, Est AFR Am: >60 mL/min (ref >60)
GFR, Estimated: >60 mL/min (ref >60)
Glucose, Bld: 78 mg/dL (ref 70–99)
Potassium: 3.9 mmol/L (ref 3.5–5.1)
Sodium: 141 mmol/L (ref 135–145)
Total Bilirubin: 0.3 mg/dL (ref 0.3–1.2)
Total Protein: 7 g/dL (ref 6.5–8.1)

## 2019-06-01 LAB — CBC WITH DIFFERENTIAL (CANCER CENTER ONLY)
Abs Immature Granulocytes: 0 10*3/uL (ref 0.00–0.07)
Basophils Absolute: 0 10*3/uL (ref 0.0–0.1)
Basophils Relative: 2 %
Eosinophils Absolute: 0.1 10*3/uL (ref 0.0–0.5)
Eosinophils Relative: 3 %
HCT: 32 % — ABNORMAL LOW (ref 36.0–46.0)
Hemoglobin: 10.4 g/dL — ABNORMAL LOW (ref 12.0–15.0)
Immature Granulocytes: 0 %
Lymphocytes Relative: 23 %
Lymphs Abs: 0.6 10*3/uL — ABNORMAL LOW (ref 0.7–4.0)
MCH: 30.3 pg (ref 26.0–34.0)
MCHC: 32.5 g/dL (ref 30.0–36.0)
MCV: 93.3 fL (ref 80.0–100.0)
Monocytes Absolute: 0.4 10*3/uL (ref 0.1–1.0)
Monocytes Relative: 15 %
Neutro Abs: 1.6 10*3/uL — ABNORMAL LOW (ref 1.7–7.7)
Neutrophils Relative %: 57 %
Platelet Count: 120 10*3/uL — ABNORMAL LOW (ref 150–400)
RBC: 3.43 MIL/uL — ABNORMAL LOW (ref 3.87–5.11)
RDW: 14.6 % (ref 11.5–15.5)
WBC Count: 2.7 10*3/uL — ABNORMAL LOW (ref 4.0–10.5)
nRBC: 0 % (ref 0.0–0.2)

## 2019-06-01 MED ORDER — SODIUM CHLORIDE 0.9 % IV SOLN
3.6000 mg/kg | Freq: Once | INTRAVENOUS | Status: AC
  Administered 2019-06-01: 220 mg via INTRAVENOUS
  Filled 2019-06-01: qty 8

## 2019-06-01 MED ORDER — SODIUM CHLORIDE 0.9 % IV SOLN
Freq: Once | INTRAVENOUS | Status: AC
  Filled 2019-06-01: qty 250

## 2019-06-01 MED ORDER — HEPARIN SOD (PORK) LOCK FLUSH 100 UNIT/ML IV SOLN
500.0000 [IU] | Freq: Once | INTRAVENOUS | Status: AC | PRN
  Administered 2019-06-01: 500 [IU]
  Filled 2019-06-01: qty 5

## 2019-06-01 MED ORDER — ACETAMINOPHEN 325 MG PO TABS
ORAL_TABLET | ORAL | Status: AC
  Filled 2019-06-01: qty 2

## 2019-06-01 MED ORDER — SODIUM CHLORIDE 0.9% FLUSH
10.0000 mL | INTRAVENOUS | Status: DC | PRN
  Administered 2019-06-01: 10 mL
  Filled 2019-06-01: qty 10

## 2019-06-01 MED ORDER — ACETAMINOPHEN 325 MG PO TABS
650.0000 mg | ORAL_TABLET | Freq: Once | ORAL | Status: AC
  Administered 2019-06-01: 650 mg via ORAL

## 2019-06-01 MED ORDER — DIPHENHYDRAMINE HCL 25 MG PO CAPS
50.0000 mg | ORAL_CAPSULE | Freq: Once | ORAL | Status: AC
  Administered 2019-06-01: 50 mg via ORAL

## 2019-06-01 MED ORDER — SODIUM CHLORIDE 0.9% FLUSH
10.0000 mL | INTRAVENOUS | Status: DC | PRN
  Administered 2019-06-01: 11:00:00 10 mL
  Filled 2019-06-01: qty 10

## 2019-06-01 MED ORDER — DIPHENHYDRAMINE HCL 25 MG PO CAPS
ORAL_CAPSULE | ORAL | Status: AC
  Filled 2019-06-01: qty 2

## 2019-06-01 NOTE — Patient Instructions (Signed)
[facility] Discharge Instructions for Patients Receiving Chemotherapy  Today you received the following chemotherapy agents: ado-trastuzumab emtansine.  To help prevent nausea and vomiting after your treatment, we encourage you to take your nausea medication as directed.   If you develop nausea and vomiting that is not controlled by your nausea medication, call the clinic.   BELOW ARE SYMPTOMS THAT SHOULD BE REPORTED IMMEDIATELY:  *FEVER GREATER THAN 100.5 F  *CHILLS WITH OR WITHOUT FEVER  NAUSEA AND VOMITING THAT IS NOT CONTROLLED WITH YOUR NAUSEA MEDICATION  *UNUSUAL SHORTNESS OF BREATH  *UNUSUAL BRUISING OR BLEEDING  TENDERNESS IN MOUTH AND THROAT WITH OR WITHOUT PRESENCE OF ULCERS  *URINARY PROBLEMS  *BOWEL PROBLEMS  UNUSUAL RASH Items with * indicate a potential emergency and should be followed up as soon as possible.  Feel free to call the clinic should you have any questions or concerns. The clinic phone number is [phone].  Please show the CHEMO ALERT CARD at check-in to the Emergency Department and triage nurse.   

## 2019-06-01 NOTE — Progress Notes (Signed)
Okay to treat today with Neut. 1.6, per Dr. Burr Medico.

## 2019-06-01 NOTE — Patient Instructions (Signed)

## 2019-06-08 ENCOUNTER — Inpatient Hospital Stay: Payer: BC Managed Care – PPO

## 2019-06-08 ENCOUNTER — Other Ambulatory Visit: Payer: Self-pay

## 2019-06-08 VITALS — BP 98/48 | HR 55 | Temp 97.8°F | Resp 18

## 2019-06-08 DIAGNOSIS — Z95828 Presence of other vascular implants and grafts: Secondary | ICD-10-CM

## 2019-06-08 DIAGNOSIS — Z5112 Encounter for antineoplastic immunotherapy: Secondary | ICD-10-CM | POA: Diagnosis not present

## 2019-06-08 MED ORDER — GOSERELIN ACETATE 3.6 MG ~~LOC~~ IMPL
DRUG_IMPLANT | SUBCUTANEOUS | Status: AC
  Filled 2019-06-08: qty 3.6

## 2019-06-08 MED ORDER — GOSERELIN ACETATE 3.6 MG ~~LOC~~ IMPL
3.6000 mg | DRUG_IMPLANT | Freq: Once | SUBCUTANEOUS | Status: AC
  Administered 2019-06-08: 3.6 mg via SUBCUTANEOUS

## 2019-06-08 NOTE — Patient Instructions (Signed)

## 2019-06-19 ENCOUNTER — Other Ambulatory Visit: Payer: Self-pay

## 2019-06-19 DIAGNOSIS — C50811 Malignant neoplasm of overlapping sites of right female breast: Secondary | ICD-10-CM

## 2019-06-22 ENCOUNTER — Other Ambulatory Visit: Payer: Self-pay

## 2019-06-22 ENCOUNTER — Encounter: Payer: Self-pay | Admitting: Hematology

## 2019-06-22 ENCOUNTER — Inpatient Hospital Stay: Payer: BC Managed Care – PPO | Attending: Hematology

## 2019-06-22 ENCOUNTER — Inpatient Hospital Stay: Payer: BC Managed Care – PPO

## 2019-06-22 VITALS — BP 115/60 | HR 55 | Temp 98.0°F | Resp 18 | Wt 138.5 lb

## 2019-06-22 DIAGNOSIS — Z5111 Encounter for antineoplastic chemotherapy: Secondary | ICD-10-CM | POA: Insufficient documentation

## 2019-06-22 DIAGNOSIS — C50811 Malignant neoplasm of overlapping sites of right female breast: Secondary | ICD-10-CM | POA: Insufficient documentation

## 2019-06-22 DIAGNOSIS — Z170 Estrogen receptor positive status [ER+]: Secondary | ICD-10-CM | POA: Insufficient documentation

## 2019-06-22 DIAGNOSIS — Z5112 Encounter for antineoplastic immunotherapy: Secondary | ICD-10-CM | POA: Insufficient documentation

## 2019-06-22 DIAGNOSIS — Z5189 Encounter for other specified aftercare: Secondary | ICD-10-CM | POA: Diagnosis not present

## 2019-06-22 DIAGNOSIS — D61810 Antineoplastic chemotherapy induced pancytopenia: Secondary | ICD-10-CM | POA: Diagnosis not present

## 2019-06-22 LAB — CBC WITH DIFFERENTIAL (CANCER CENTER ONLY)
Abs Immature Granulocytes: 0 10*3/uL (ref 0.00–0.07)
Basophils Absolute: 0 10*3/uL (ref 0.0–0.1)
Basophils Relative: 1 %
Eosinophils Absolute: 0.1 10*3/uL (ref 0.0–0.5)
Eosinophils Relative: 6 %
HCT: 33.1 % — ABNORMAL LOW (ref 36.0–46.0)
Hemoglobin: 10.5 g/dL — ABNORMAL LOW (ref 12.0–15.0)
Immature Granulocytes: 0 %
Lymphocytes Relative: 28 %
Lymphs Abs: 0.6 10*3/uL — ABNORMAL LOW (ref 0.7–4.0)
MCH: 29.2 pg (ref 26.0–34.0)
MCHC: 31.7 g/dL (ref 30.0–36.0)
MCV: 92.2 fL (ref 80.0–100.0)
Monocytes Absolute: 0.2 10*3/uL (ref 0.1–1.0)
Monocytes Relative: 10 %
Neutro Abs: 1.1 10*3/uL — ABNORMAL LOW (ref 1.7–7.7)
Neutrophils Relative %: 55 %
Platelet Count: 87 10*3/uL — ABNORMAL LOW (ref 150–400)
RBC: 3.59 MIL/uL — ABNORMAL LOW (ref 3.87–5.11)
RDW: 14.9 % (ref 11.5–15.5)
WBC Count: 2.1 10*3/uL — ABNORMAL LOW (ref 4.0–10.5)
nRBC: 0 % (ref 0.0–0.2)

## 2019-06-22 LAB — CMP (CANCER CENTER ONLY)
ALT: 19 U/L (ref 0–44)
AST: 15 U/L (ref 15–41)
Albumin: 4 g/dL (ref 3.5–5.0)
Alkaline Phosphatase: 96 U/L (ref 38–126)
Anion gap: 7 (ref 5–15)
BUN: 11 mg/dL (ref 6–20)
CO2: 29 mmol/L (ref 22–32)
Calcium: 9.2 mg/dL (ref 8.9–10.3)
Chloride: 107 mmol/L (ref 98–111)
Creatinine: 0.76 mg/dL (ref 0.44–1.00)
GFR, Est AFR Am: >60 mL/min (ref >60)
GFR, Estimated: >60 mL/min (ref >60)
Glucose, Bld: 103 mg/dL — ABNORMAL HIGH (ref 70–99)
Potassium: 4 mmol/L (ref 3.5–5.1)
Sodium: 143 mmol/L (ref 135–145)
Total Bilirubin: 0.4 mg/dL (ref 0.3–1.2)
Total Protein: 6.9 g/dL (ref 6.5–8.1)

## 2019-06-22 MED ORDER — SODIUM CHLORIDE 0.9% FLUSH
10.0000 mL | INTRAVENOUS | Status: DC | PRN
  Administered 2019-06-22: 10 mL
  Filled 2019-06-22: qty 10

## 2019-06-22 MED ORDER — SODIUM CHLORIDE 0.9 % IV SOLN
Freq: Once | INTRAVENOUS | Status: AC
  Filled 2019-06-22: qty 250

## 2019-06-22 MED ORDER — DIPHENHYDRAMINE HCL 25 MG PO CAPS
ORAL_CAPSULE | ORAL | Status: AC
  Filled 2019-06-22: qty 2

## 2019-06-22 MED ORDER — SODIUM CHLORIDE 0.9 % IV SOLN
3.6000 mg/kg | Freq: Once | INTRAVENOUS | Status: AC
  Administered 2019-06-22: 220 mg via INTRAVENOUS
  Filled 2019-06-22: qty 5

## 2019-06-22 MED ORDER — ACETAMINOPHEN 325 MG PO TABS
650.0000 mg | ORAL_TABLET | Freq: Once | ORAL | Status: AC
  Administered 2019-06-22: 650 mg via ORAL

## 2019-06-22 MED ORDER — DIPHENHYDRAMINE HCL 25 MG PO CAPS
50.0000 mg | ORAL_CAPSULE | Freq: Once | ORAL | Status: AC
  Administered 2019-06-22: 50 mg via ORAL

## 2019-06-22 MED ORDER — HEPARIN SOD (PORK) LOCK FLUSH 100 UNIT/ML IV SOLN
500.0000 [IU] | Freq: Once | INTRAVENOUS | Status: AC | PRN
  Administered 2019-06-22: 500 [IU]
  Filled 2019-06-22: qty 5

## 2019-06-22 MED ORDER — ACETAMINOPHEN 325 MG PO TABS
ORAL_TABLET | ORAL | Status: AC
  Filled 2019-06-22: qty 2

## 2019-06-22 NOTE — Progress Notes (Signed)
Per Dr. Burr Medico, okay for patient to receive treatment today with platelets 87 and ANC 1.1

## 2019-06-22 NOTE — Patient Instructions (Signed)
[facility] Discharge Instructions for Patients Receiving Chemotherapy  Today you received the following chemotherapy agents: ado-trastuzumab emtansine (Kadcyla)  To help prevent nausea and vomiting after your treatment, we encourage you to take your nausea medication as directed.   If you develop nausea and vomiting that is not controlled by your nausea medication, call the clinic.   BELOW ARE SYMPTOMS THAT SHOULD BE REPORTED IMMEDIATELY:  *FEVER GREATER THAN 100.5 F  *CHILLS WITH OR WITHOUT FEVER  NAUSEA AND VOMITING THAT IS NOT CONTROLLED WITH YOUR NAUSEA MEDICATION  *UNUSUAL SHORTNESS OF BREATH  *UNUSUAL BRUISING OR BLEEDING  TENDERNESS IN MOUTH AND THROAT WITH OR WITHOUT PRESENCE OF ULCERS  *URINARY PROBLEMS  *BOWEL PROBLEMS  UNUSUAL RASH Items with * indicate a potential emergency and should be followed up as soon as possible.  Feel free to call the clinic should you have any questions or concerns. The clinic phone number is [phone].  Please show the [facility] at check-in to the Emergency Department and triage nurse.

## 2019-06-24 ENCOUNTER — Other Ambulatory Visit: Payer: Self-pay

## 2019-06-24 ENCOUNTER — Inpatient Hospital Stay: Payer: BC Managed Care – PPO

## 2019-06-24 ENCOUNTER — Ambulatory Visit (HOSPITAL_COMMUNITY)
Admission: RE | Admit: 2019-06-24 | Discharge: 2019-06-24 | Disposition: A | Discharge status: Home / Self Care | Payer: BC Managed Care – PPO | Source: Ambulatory Visit | Attending: Internal Medicine | Admitting: Internal Medicine

## 2019-06-24 ENCOUNTER — Encounter (HOSPITAL_COMMUNITY): Payer: Self-pay | Admitting: Internal Medicine

## 2019-06-24 ENCOUNTER — Ambulatory Visit (HOSPITAL_BASED_OUTPATIENT_CLINIC_OR_DEPARTMENT_OTHER)
Admission: RE | Admit: 2019-06-24 | Discharge: 2019-06-24 | Disposition: A | Discharge status: Home / Self Care | Payer: BC Managed Care – PPO | Source: Ambulatory Visit | Attending: Internal Medicine | Admitting: Internal Medicine

## 2019-06-24 VITALS — BP 112/62 | HR 62 | Temp 98.2°F | Resp 18

## 2019-06-24 VITALS — BP 92/54 | HR 61 | Wt 138.0 lb

## 2019-06-24 DIAGNOSIS — Z170 Estrogen receptor positive status [ER+]: Secondary | ICD-10-CM | POA: Diagnosis not present

## 2019-06-24 DIAGNOSIS — Z5111 Encounter for antineoplastic chemotherapy: Secondary | ICD-10-CM | POA: Diagnosis not present

## 2019-06-24 DIAGNOSIS — C50811 Malignant neoplasm of overlapping sites of right female breast: Secondary | ICD-10-CM

## 2019-06-24 DIAGNOSIS — J45909 Unspecified asthma, uncomplicated: Secondary | ICD-10-CM | POA: Insufficient documentation

## 2019-06-24 DIAGNOSIS — C50311 Malignant neoplasm of lower-inner quadrant of right female breast: Secondary | ICD-10-CM | POA: Diagnosis not present

## 2019-06-24 DIAGNOSIS — R590 Localized enlarged lymph nodes: Secondary | ICD-10-CM | POA: Diagnosis not present

## 2019-06-24 DIAGNOSIS — Z803 Family history of malignant neoplasm of breast: Secondary | ICD-10-CM | POA: Insufficient documentation

## 2019-06-24 DIAGNOSIS — Z79899 Other long term (current) drug therapy: Secondary | ICD-10-CM | POA: Insufficient documentation

## 2019-06-24 DIAGNOSIS — R911 Solitary pulmonary nodule: Secondary | ICD-10-CM | POA: Insufficient documentation

## 2019-06-24 DIAGNOSIS — C50511 Malignant neoplasm of lower-outer quadrant of right female breast: Secondary | ICD-10-CM | POA: Diagnosis not present

## 2019-06-24 DIAGNOSIS — Z01818 Encounter for other preprocedural examination: Secondary | ICD-10-CM | POA: Insufficient documentation

## 2019-06-24 DIAGNOSIS — Z5112 Encounter for antineoplastic immunotherapy: Secondary | ICD-10-CM | POA: Diagnosis not present

## 2019-06-24 DIAGNOSIS — Z95828 Presence of other vascular implants and grafts: Secondary | ICD-10-CM

## 2019-06-24 MED ORDER — PEGFILGRASTIM-CBQV 6 MG/0.6ML ~~LOC~~ SOSY
PREFILLED_SYRINGE | SUBCUTANEOUS | Status: AC
  Filled 2019-06-24: qty 0.6

## 2019-06-24 MED ORDER — PEGFILGRASTIM-CBQV 6 MG/0.6ML ~~LOC~~ SOSY
6.0000 mg | PREFILLED_SYRINGE | Freq: Once | SUBCUTANEOUS | Status: AC
  Administered 2019-06-24: 6 mg via SUBCUTANEOUS

## 2019-06-24 NOTE — Patient Instructions (Signed)

## 2019-06-24 NOTE — Progress Notes (Signed)
Cardio-Oncology Clinic Note   Date:  06/24/2019   ID:  Kim [name], DOB Dec 09, 1975, MRN [id number]  Location: Home  Provider location: [facility] Type of Visit: New patient  PCP:  Kim [name]  Cardiologist:  No primary care provider on file. Primary HF: Kim [name]  Chief Complaint:  Breast cancer   History of Present Illness:  Kim [name] is a 44 y/o female with h/o GERD, asthma and right breast CA referred by Kim [name] for enrollment in the Cardio-oncology clinic for echo surveillance during chemotherapy.    Cancer Staging Cancer of overlapping sites of right female breast [facility]) Staging form: Breast, AJCC 8th Edition - Clinical stage from 05/28/2018: Stage IB (cT2, cN1, cM0, G3, ER+, PR+, HER2+) - Signed by Kim [name] on 05/28/2018  Malignant neoplasm of lower-outer quadrant of right breast of female, estrogen receptor positive ([place]) Staging form: Breast, AJCC 8th Edition - Clinical stage from 05/28/2018: Stage IIB (cT2, cN1, cM0, G3, ER+, PR+, HER2-) - Unsigned       Cancer of overlapping sites of right female breast ([place])   05/14/2018 Mammogram    Right breast mammogram 05/14/18  IMPRESSION  There is a 1.0 x 0.5 x 1.2 cm lobular hypoechoic mass right breast 7 o'clock position 3 cm from nipple, a 0.8 x 0.6 x 1.2 cm lobular hypoechoic mass right breast 7 o'clock position 2 cm from the nipple, and a 0.9 x 0.5 x 0.8 cm lobular hypoechoic mass right breast 5 o'clock position 3 cm from nipple.  There is a 3.8 x 1.0 x 1.3 cm lobular hypoechoic mass right breast 5 o'clock position 2 cm from nipple.  Multiple thickened right axillary lymph nodes (approximately 15). Measuring up to 1.0 cm in thickness. Adenopathy corresponds with axillary palpable abnormality.  Indeterminate round and punctate calcifications upper-outer quadrant right breast.Indeterminate round and punctate calcifications upper-outer quadrant right breast.     05/21/2018 Initial Biopsy    Diagnosis 05/21/18  1. Breast, right, needle core biopsy, axilla, 5 o'clock, ribbon clip - INVASIVE MAMMARY CARCINOMA. - MAMMARY CARCINOMA IN SITU. 2. Breast, right, needle core biopsy, 7 o'clock, coil clip - INVASIVE MAMMARY CARCINOMA. - MAMMARY CARCINOMA IN SITU. 3. Lymph node, needle/core biopsy, right axillary LN, hydromark - METASTATIC MAMMARY CARCINOMA.     05/21/2018 Receptors her2    1. PROGNOSTIC INDICATORS Results: The tumor cells are EQUIVOCAL for Her2 (2+);  HER2 **NEGATIVE** by FISH   Estrogen Receptor: 80%, POSITIVE, MODERATE STAINING INTENSITY Progesterone Receptor: 5%, POSITIVE, MODERATE-WEAK STAINING INTENSITY Proliferation Marker Ki67: 10%  3. PROGNOSTIC INDICATORS Results: The tumor cells are POSITIVE for Her2 (3+). Estrogen Receptor: 90%, POSITIVE, STRONG STAINING INTENSITY Progesterone Receptor: 50%, POSITIVE, MODERATE STAINING INTENSITY Proliferation Marker Ki67: 5%    05/26/2018 Initial Diagnosis    Malignant neoplasm of lower-inner quadrant of right breast of female, estrogen receptor positive ([place])    05/27/2018 Mammogram    Left breast mammogram 05/27/18  IMPRESSION: No mammographic evidence of LEFT breast malignancy.    05/28/2018 Cancer Staging    Staging form: Breast, AJCC 8th Edition - Clinical stage from 05/28/2018: Stage IB (cT2, cN1, cM0, G3, ER+, PR+, HER2+) - Signed by Kim [name] on 05/28/2018    06/03/2018 Imaging    MR BREAST BILATERAL W WO CONTRAST INC CAD  IMPRESSION: 1. Large enhancing masses and non mass enhancement identified throughout the majority of the right breast. Additionally, there are smaller masses identified within the upper inner and lower  inner right breast. Overall findings are concerning for extensive malignancy throughout all 4 quadrants of the right breast. 2. Extensive bulky lymphadenopathy identified within the right axilla. The matted appearance makes  counting the number of abnormal lymph nodes difficult as they are immediately approximated to each other. 3. At least one abnormal appearing right internal mammary lymph node. 4. Nonenhancing lesion within the sternum with associated sternal marrow heterogeneity, raising the possibility of osseous metastatic disease within the sternum. 5. Two enhancing masses within the left breast as described above.      06/05/2018 PET scan    NM PET Image Initial (PI) Skull Base To Thigh  IMPRESSION: 1. Multiple borderline enlarged right axillary lymph nodes identified exhibiting mild to moderate increased uptake. Can not rule out nodal metastasis. Correlation with biopsy recommended. 2. Asymmetric increased uptake within the right breast corresponding to MRI abnormality. 3. Subcentimeter right retropectoral and right internal mammary lymph nodes are again identified and exhibit nonspecific, low level FDG uptake. 4. Noncalcified solid nodule within the lateral right lower lobe measures 9 mm without significant radiotracer uptake. 5. No hypermetabolic osseous lesions identified.      06/09/2018 -  Chemotherapy    Neoadjuvant chemo TCHP every 3 weeks     06/10/2018 Pathology Results    Surgical pathology  Diagnosis 1. Breast, left, needle core biopsy, LOQ, barbell - MILD FIBROCYSTIC CHANGES. 2. Breast, left, needle core biopsy, UIQ cylinder - MILD FIBROCYSTIC CHANGES.     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 There were no deleterious mutationsinAIP, ALK, APC, ATM, AXIN2,BAP1, BARD1, BLM, BMPR1A, BRCA1, BRCA2, BRIP1, CASR, CDC73, CDH1, CDK4, CDKN1B, CDKN1C, CDKN2A (p14ARF), CDKN2A (p16INK4a), CEBPA, CHEK2, CTNNA1, DICER1, DIS3L2, EGFR (c.2369C>T, p.Thr790Met variant only), EPCAM  (Deletion/duplication testing only), FH, FLCN, GATA2, GPC3, GREM1 (Promoter region deletion/duplication testing only), HOXB13 (c.251G>A, p.Gly84Glu), HRAS, KIT, MAX, MEN1, MET, MITF (c.952G>A, p.Glu318Lys variant only), MLH1, MSH2, MSH3, MSH6, MUTYH, NBN, NF1, NF2, NTHL1, PDGFRA, PHOX2B, PMS2, POLD1, POLE, POT1, PRKAR1A, PTCH1, PTEN, RAD50, RAD51C, RAD51D, RB1, RECQL4, RET, RUNX1, SDHAF2, SDHA (sequence changes only), SDHB, SDHC, SDHD, SMAD4, SMARCA4, SMARCB1, SMARCE1, STK11, SUFU, TERC, TERT, TMEM127, TP53, TSC1, TSC2, VHL, WRN and WT1. .     She is a Chartered certified accountant at [employer]. No h/o any cardiology issues. Has always been active. She started [facility] in 2/20. Had surgery on July 7. Now on Kadcyla. Completed XRT on 02/03/19. Tolerating Kadcyla well except for severe constipation. Remains very active with running and other activities. No SOB, dizziness or edema.   Echo today 06/24/19 EF 60-65% GLS -26.9%  Echo 03/18/19 EF 60-65% GLS -85.2% Normal diastolic function.  Echo 12/10/18 EF 60-65% GLS -22.2%  Echo 05/30/18 EF 55-60% GLS - 27.1% Echo 08/28/18 EF 55-60% GLS -21.8%  Harlon Ditty denies symptoms worrisome for COVID 19.   Past Medical History:  Diagnosis Date  . Acid reflux   . Acute bronchitis   . Anemia   . Asthma   . Family history of brain cancer   . Family history of breast cancer   . Family history of prostate cancer   . Migraine    Past Surgical History:  Procedure Laterality Date  . BREAST BIOPSY Right 05/23/2018   x3, malignant  . DILATION AND CURETTAGE OF UTERUS     2006 x1, 2009 x2  . LAPAROTOMY  2008  . PORTACATH PLACEMENT Left 06/06/2018   Procedure: INSERTION  PORT-A-CATH POSSIBLE ULTRASOUND;  Surgeon: [name], [name];  Location: [place];  Service: General;  Laterality: Left;     Current Outpatient Medications  Medication Sig Dispense Refill  . albuterol (PROVENTIL HFA;VENTOLIN HFA) 108 (90 BASE) MCG/ACT inhaler Inhale 2 puffs into the lungs  every 6 (six) hours as needed for wheezing or shortness of breath.    . ALPRAZolam (XANAX) 0.5 MG tablet Take 0.25-0.5 mg by mouth at bedtime as needed for anxiety.     Haze Justin Probiotic (BIOGAIA PROBIOTIC) LIQD Take by mouth daily at 8 pm.    . Cholecalciferol (VITAMIN D) 50 MCG (2000 UT) CAPS Take 2,000 Units by mouth daily.     . diphenhydrAMINE (BENADRYL) 25 mg capsule Take 25 mg by mouth at bedtime as needed for sleep.    Marland Kitchen ibuprofen (ADVIL) 200 MG tablet Take 400 mg by mouth every 6 (six) hours as needed for headache or moderate pain.    Marland Kitchen letrozole (FEMARA) 2.5 MG tablet TAKE 1 TABLET BY MOUTH EVERY DAY 90 tablet 1  . Melatonin 3 MG CAPS Take 3 mg by mouth at bedtime.    . Polyethylene Glycol 3350 (MIRALAX PO) Take 17 g by mouth daily as needed.     . propranolol (INDERAL) 10 MG tablet Take 5 mg by mouth daily as needed (for shaking hands).     . sertraline (ZOLOFT) 50 MG tablet TAKE 1 TABLET BY MOUTH EVERY DAY 90 tablet 1  . zolpidem (AMBIEN) 5 MG tablet Take 5 mg by mouth at bedtime as needed.     No current facility-administered medications for this encounter.    Allergies:   Patient has no known allergies.   Social History:  The patient  reports that she has never smoked. She has never used smokeless tobacco. She reports previous alcohol use. She reports that she does not use drugs.   Family History:  The patient's family history includes Breast cancer in her mother and another family member; Breast cancer (age of onset: 46) in her maternal great-grandmother; Cancer in her maternal aunt and mother; Cancer (age of onset: 42) in her sister; Cancer - Other in an other family member; Hyperlipidemia in her father; Prostate cancer (age of onset: 44) in her maternal uncle; Prostate cancer (age of onset: 10) in her maternal grandfather; Skin cancer in her maternal grandmother.   ROS:  Please see the history of present illness.   All other systems are personally reviewed and negative.    Vitals:   06/24/19 1016  BP: (!) 92/54  Pulse: 61  SpO2: 97%    Exam:   General:  Well/fit appearing. No resp difficulty HEENT: normal Neck: supple. no JVD. Carotids 2+ bilat; no bruits. No lymphadenopathy or thryomegaly appreciated. Cor: PMI nondisplaced. Regular rate & rhythm. No rubs, gallops or murmurs.Port on L  Lungs: clear Abdomen: soft, nontender, nondistended. No hepatosplenomegaly. No bruits or masses. Good bowel sounds. Extremities: no cyanosis, clubbing, rash, edema Neuro: alert & orientedx3, cranial nerves grossly intact. moves all 4 extremities w/o difficulty. Affect pleasant   Recent Labs: 06/22/2019: ALT 19; BUN 11; Creatinine 0.76; Hemoglobin 10.5; Platelet Count 87; Potassium 4.0; Sodium 143  Personally reviewed   Wt Readings from Last 3 Encounters:  06/24/19 62.6 kg (138 lb)  06/22/19 62.8 kg (138 lb 8 oz)  05/11/19 63 kg (139 lb)      ASSESSMENT AND PLAN:  1.  R Breast CA - - s/p surgery, XRT. Tolerating Kadcyla. -  I reviewed echos personally. EF and Doppler parameters stable. No signs/symptoms of chemo toxicity. Continue Kadcyla. Will complete in May then switch to oral agent.  - Repeat echo in 4 months.     Signed, Glori Bickers, [name]  06/24/2019 10:42 AM  [facility] [address] [place] [address] [phone] (office) [phone] (fax)

## 2019-06-24 NOTE — Progress Notes (Signed)
  Echocardiogram 2D Echocardiogram has been performed.  Liborio Saccente A Macklin Jacquin 06/24/2019, 9:58 AM

## 2019-06-24 NOTE — Patient Instructions (Signed)
Your physician recommends that you schedule a follow-up appointment in: 4 months with echocardiogram  

## 2019-06-26 ENCOUNTER — Encounter: Payer: Self-pay | Admitting: Hematology

## 2019-07-06 ENCOUNTER — Inpatient Hospital Stay: Payer: BC Managed Care – PPO

## 2019-07-06 ENCOUNTER — Other Ambulatory Visit: Payer: Self-pay

## 2019-07-06 VITALS — BP 112/60 | HR 61 | Temp 98.2°F | Resp 18

## 2019-07-06 DIAGNOSIS — Z95828 Presence of other vascular implants and grafts: Secondary | ICD-10-CM

## 2019-07-06 DIAGNOSIS — Z5112 Encounter for antineoplastic immunotherapy: Secondary | ICD-10-CM | POA: Diagnosis not present

## 2019-07-06 MED ORDER — GOSERELIN ACETATE 3.6 MG ~~LOC~~ IMPL
DRUG_IMPLANT | SUBCUTANEOUS | Status: AC
  Filled 2019-07-06: qty 3.6

## 2019-07-06 MED ORDER — GOSERELIN ACETATE 3.6 MG ~~LOC~~ IMPL
3.6000 mg | DRUG_IMPLANT | Freq: Once | SUBCUTANEOUS | Status: AC
  Administered 2019-07-06: 3.6 mg via SUBCUTANEOUS

## 2019-07-06 NOTE — Patient Instructions (Signed)

## 2019-07-07 NOTE — Progress Notes (Signed)
Pharmacist Chemotherapy Monitoring - Follow Up Assessment    I verify that I have reviewed each item in the below checklist:  . Regimen for the patient is scheduled for the appropriate day and plan matches scheduled date. Marland Kitchen Appropriate non-routine labs are ordered dependent on drug ordered. . If applicable, additional medications reviewed and ordered per protocol based on lifetime cumulative doses and/or treatment regimen.   Plan for follow-up and/or issues identified: Yes . I-vent associated with next due treatment: No . MD and/or nursing notified: No  Kim [name] [facility] 07/07/2019 8:10 AM

## 2019-07-10 ENCOUNTER — Other Ambulatory Visit: Payer: Self-pay

## 2019-07-10 ENCOUNTER — Ambulatory Visit (HOSPITAL_COMMUNITY)
Admission: RE | Admit: 2019-07-10 | Discharge: 2019-07-10 | Disposition: A | Discharge status: Home / Self Care | Payer: BC Managed Care – PPO | Source: Ambulatory Visit | Attending: Hematology | Admitting: Hematology

## 2019-07-10 DIAGNOSIS — R911 Solitary pulmonary nodule: Secondary | ICD-10-CM | POA: Diagnosis not present

## 2019-07-11 ENCOUNTER — Other Ambulatory Visit: Payer: Self-pay | Admitting: Hematology

## 2019-07-12 NOTE — Progress Notes (Signed)
[place]   [phone] [phone]   Clinic Follow up Note   Patient Care Team: [name], MD as PCP - General (Family Medicine) [name], MD as Consulting Physician (General Surgery) [name], MD as Consulting Physician (Hematology) [name], MD as Consulting Physician (Radiation Oncology) [name], MD as Consulting Physician (Obstetrics and Gynecology) [name], RN as Oncology Nurse Navigator [name], RN as Oncology Nurse Navigator [name], NP as Nurse Practitioner (Nurse Practitioner) 07/13/2019  CHIEF COMPLAINT: F/u multifocal right breast cancer   SUMMARY OF ONCOLOGIC HISTORY: Oncology History Overview Note  Cancer Staging Cancer of overlapping sites of right female breast [facility]) Staging form: Breast, AJCC 8th Edition - Clinical stage from 05/28/2018: Stage IB (cT2, cN1, cM0, G3, ER+, PR+, HER2+) - Signed by [name], MD on 05/28/2018  Malignant neoplasm of lower-outer quadrant of right breast of female, estrogen receptor positive ([place]) Staging form: Breast, AJCC 8th Edition - Clinical stage from 05/28/2018: Stage IIB (cT2, cN1, cM0, G3, ER+, PR+, HER2-) - Unsigned     Cancer of overlapping sites of right female breast ([place])  05/14/2018 Mammogram   Right breast mammogram 05/14/18  IMPRESSION  There is a 1.0 x 0.5 x 1.2 cm lobular hypoechoic mass right breast 7 o'clock position 3 cm from nipple, a 0.8 x 0.6 x 1.2 cm lobular hypoechoic mass right breast 7 o'clock position 2 cm from the nipple, and a 0.9 x 0.5 x 0.8 cm lobular hypoechoic mass right breast 5 o'clock position 3 cm from nipple.  There is a 3.8 x 1.0 x 1.3 cm lobular hypoechoic mass right breast 5 o'clock position 2 cm from nipple.  Multiple thickened right axillary lymph nodes (approximately 15). Measuring up to 1.0 cm in thickness. Adenopathy corresponds with axillary palpable abnormality.  Indeterminate round and punctate  calcifications upper-outer quadrant right breast.Indeterminate round and punctate calcifications upper-outer quadrant right breast.   05/21/2018 Initial Biopsy   Diagnosis 05/21/18  1. Breast, right, needle core biopsy, axilla, 5 o'clock, ribbon clip - INVASIVE MAMMARY CARCINOMA. - MAMMARY CARCINOMA IN SITU. 2. Breast, right, needle core biopsy, 7 o'clock, coil clip - INVASIVE MAMMARY CARCINOMA. - MAMMARY CARCINOMA IN SITU. 3. Lymph node, needle/core biopsy, right axillary LN, hydromark - METASTATIC MAMMARY CARCINOMA.    05/21/2018 Receptors her2   1. PROGNOSTIC INDICATORS Results: The tumor cells are EQUIVOCAL for Her2 (2+);  HER2 **NEGATIVE** by FISH   Estrogen Receptor: 80%, POSITIVE, MODERATE STAINING INTENSITY Progesterone Receptor: 5%, POSITIVE, MODERATE-WEAK STAINING INTENSITY Proliferation Marker Ki67: 10%  3. PROGNOSTIC INDICATORS Results: The tumor cells are POSITIVE for Her2 (3+). Estrogen Receptor: 90%, POSITIVE, STRONG STAINING INTENSITY Progesterone Receptor: 50%, POSITIVE, MODERATE STAINING INTENSITY Proliferation Marker Ki67: 5%   05/26/2018 Initial Diagnosis   Malignant neoplasm of lower-inner quadrant of right breast of female, estrogen receptor positive ([place])   05/27/2018 Mammogram   Left breast mammogram 05/27/18  IMPRESSION: No mammographic evidence of LEFT breast malignancy.   05/28/2018 Cancer Staging   Staging form: Breast, AJCC 8th Edition - Clinical stage from 05/28/2018: Stage IB (cT2, cN1, cM0, G3, ER+, PR+, HER2+) - Signed by [name], MD on 05/28/2018   06/03/2018 Imaging   MR BREAST BILATERAL W WO CONTRAST INC CAD  IMPRESSION: 1. Large enhancing masses and non mass enhancement identified throughout the majority of the right breast. Additionally, there are smaller masses identified within the upper inner and lower inner right breast. Overall findings are concerning for extensive malignancy  throughout all 4 quadrants of the right breast. 2.  Extensive bulky lymphadenopathy identified within the right axilla. The matted appearance makes counting the number of abnormal lymph nodes difficult as they are immediately approximated to each other. 3. At least one abnormal appearing right internal mammary lymph node. 4. Nonenhancing lesion within the sternum with associated sternal marrow heterogeneity, raising the possibility of osseous metastatic disease within the sternum. 5. Two enhancing masses within the left breast as described above.     06/05/2018 PET scan   NM PET Image Initial (PI) Skull Base To Thigh  IMPRESSION: 1. Multiple borderline enlarged right axillary lymph nodes identified exhibiting mild to moderate increased uptake. Can not rule out nodal metastasis. Correlation with biopsy recommended. 2. Asymmetric increased uptake within the right breast corresponding to MRI abnormality. 3. Subcentimeter right retropectoral and right internal mammary lymph nodes are again identified and exhibit nonspecific, low level FDG uptake. 4. Noncalcified solid nodule within the lateral right lower lobe measures 9 mm without significant radiotracer uptake. 5. No hypermetabolic osseous lesions identified.     06/09/2018 - 11/03/2018 Chemotherapy   Neoadjuvant chemotherapy TCHP, 06/09/2018-09/22/18. Followed by Maintenance Herceptin/Perjeta q3weeks starting 10/14/18 to continue 1 year treatment. Stopped Herceptin/Perjeta on 11/03/18 due to multiple Positive LN of pathology.    06/10/2018 Pathology Results   Surgical pathology  Diagnosis 1. Breast, left, needle core biopsy, LOQ, barbell - MILD FIBROCYSTIC CHANGES. 2. Breast, left, needle core biopsy, UIQ cylinder - MILD FIBROCYSTIC CHANGES.    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 There were no deleterious  mutations in AIP, ALK, APC, ATM, AXIN2,BAP1,  BARD1, BLM, BMPR1A, BRCA1, BRCA2, BRIP1, CASR, CDC73, CDH1, CDK4, CDKN1B, CDKN1C, CDKN2A (p14ARF), CDKN2A (p16INK4a), CEBPA, CHEK2, CTNNA1, DICER1, DIS3L2, EGFR (c.2369C>T, p.Thr790Met variant only), EPCAM (Deletion/duplication testing only), FH, FLCN, GATA2, GPC3, GREM1 (Promoter region deletion/duplication testing only), HOXB13 (c.251G>A, p.Gly84Glu), HRAS, KIT, MAX, MEN1, MET, MITF (c.952G>A, p.Glu318Lys variant only), MLH1, MSH2, MSH3, MSH6, MUTYH, NBN, NF1, NF2, NTHL1, PDGFRA, PHOX2B, PMS2, POLD1, POLE, POT1, PRKAR1A, PTCH1, PTEN, RAD50, RAD51C, RAD51D, RB1, RECQL4, RET, RUNX1, SDHAF2, SDHA (sequence changes only), SDHB, SDHC, SDHD, SMAD4, SMARCA4, SMARCB1, SMARCE1, STK11, SUFU, TERC, TERT, TMEM127, TP53, TSC1, TSC2, VHL, WRN and WT1.  .   09/24/2018 Breast MRI   Breast [facility] on 09/24/18 Right breast: Signal void inferior right breast, posterior depth from biopsy marker clip. Interval decreased size of the right breast, and now symmetric to the left, compared to prior MRI. Resolution of abnormal nonmass enhancement without evidence of residual enhancing mass. No evidence of axillary or internal mammary lymphadenopathy is seen, markedly improved from prior. There is no abnormal skin, nipple, or pectoralis muscle enhancement.  Left breast: There are no suspicious enhancing masses or areas of non-mass enhancement. No evidence of axillary or internal mammary lymphadenopathy is seen. There is no abnormal skin, nipple, or pectoralis muscle enhancement.   10/09/2018 Imaging   CT chest W Contrast 10/09/18  IMPRESSION: 1. Stable 10 mm right lower lobe pulmonary nodule since 06/05/2018. Nodule is not substantially hypermetabolic on the PET-CT. Continued attention on follow-up recommended. 2. No findings to suggest metastatic disease in the thorax.   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 surrounding tissue - Biopsy clips identified - Margin status (see extended anterior margin below)  Invasive carcinoma: Positive anterior-inferior margin  Ductal carcinoma in situ: Positive anterior-inferior margin - Ancillary studies previously reported on [id number](5:00, ribbon clip) Estrogen receptor: Positive (80%) Progesterone receptor: Positive (10%) HER2 IHC: Equivocal (2+) HER2 FISH: Negative HER2/CEP17 ratio: 1.35 Mean HER2 copy number: 1.55 - Ancillary studies previously reported on [id number] (right axillary lymph node) Estrogen receptor: Positive (90%) Progesterone receptor: Positive (20%) HER2 IHC: Positive (3+) - Residual Cancer Burden  Tumor bed size: 41 mm x 20 mm Overall tumor cellularity: 10% Percentage in situ: 60% Number of involved lymph nodes: 7 Diameter of largest metastasis: 6 mm Residual Cancer Burden Score: 3.108 Residual Cancer Burden Class: RCB-II - Other findings: Atypical lobular hyperplasia and columnar cell change with microcalcifications; fibroadenomas with microcalcifications  B: Right axilla, targeted lymph node excision - Six of seven lymph nodes positive for carcinoma (6/7) - Size of largest tumor deposit: 6 mm - Biopsy site identified - Treatment effect: Present - Extracapsular extension: Absent  C: Right axilla, lymph node dissection - One of seven lymph nodes positive for carcinoma, macrometastasis, 6 mm (1/7) - Treatment effect: Absent - Extracapsular extension: Absent  D: Right breast, nipple core biopsy - Negative for carcinoma  E: Breast, right, anterior margin, excision - Microscopic foci of ductal carcinoma in situ - Margin: Negative, <1 mm    11/05/2018 Cancer  Staging   Staging form: Breast, AJCC 8th Edition - Pathologic stage from 11/05/2018: No Stage Recommended (ypT1c, pN2a, cM0, G2, ER+, PR+, HER2-) - Signed by [name], MD on 11/23/2018   11/24/2018 -  Chemotherapy   Due to positive LN on surgical pathology her maintance Herceptin/Perjeta was changed to [place] for about 14 cycles on 11/24/18   12/24/2018 - 02/03/2019 Radiation Therapy   adjuvant radiation at [facility] on 12/24/18 and completed on 02/03/19.    01/23/2019 Imaging   CT Chest wo  IMPRESSION: 1. Stable right lower lobe pulmonary nodule. 2. No signs of new or suspicious finding since study of 10/09/2018, now status post right mastectomy, axillary dissection and breast reconstruction.   02/16/2019 -  Anti-estrogen oral therapy   -Monthly Zoladex injection started 12/19/18 -Letrozole 2.5 mg started 02/16/19    04/20/2019 Survivorship   SCP delivered by Cira Rue, NP      CURRENT THERAPY:  -Due to positive LN on surgical pathology hermaintenanceHerceptin/Perjeta was changed to Kadcyla q3weeks for about 14 cycles on 11/24/18 -Monthly Zoladex injection started 12/19/18 -Letrozole 2.5 mg started 02/16/19  INTERVAL HISTORY: Kim [name] returns for f/u and treatment as scheduled. She completed cycle 11 kadcyla on 06/22/19 with Udenyca. She had echo and f/u with cardiology on 3/10 which showed stable EF. Plan to repeat in 4 months. She had a CT chest on 3/26 to monitor lung nodule.   She is doing well overall. She increased zoloft 2 weeks ago to 100 mg daily. This has helped her sleep but continues to have moderate hot flashes. Denies bone/joint pain. She had some nerves "wake up" in the right axilla/arm. The expander is uncomfortable. Otherwise denies new concerns in the breasts. Denies bone/joint pain. Appetite and energy level at baseline. Manages chronic constipation. She had a couple dizzy/nausea spells lately. Continues to work out daily and function normally. Might  have  been dehydrated. No fall. Denies fever, chills, cough, chest pain, dyspnea, leg swelling, or other concerns.     MEDICAL HISTORY:  Past Medical History:  Diagnosis Date  . Acid reflux   . Acute bronchitis   . Anemia   . Asthma   . Family history of brain cancer   . Family history of breast cancer   . Family history of prostate cancer   . Migraine     SURGICAL HISTORY: Past Surgical History:  Procedure Laterality Date  . BREAST BIOPSY Right 05/23/2018   x3, malignant  . DILATION AND CURETTAGE OF UTERUS     2006 x1, 2009 x2  . LAPAROTOMY  2008  . PORTACATH PLACEMENT Left 06/06/2018   Procedure: INSERTION PORT-A-CATH POSSIBLE ULTRASOUND;  Surgeon: [name], MD;  Location: [place];  Service: General;  Laterality: Left;    I have reviewed the social history and family history with the patient and they are unchanged from previous note.  ALLERGIES:  has No Known Allergies.  MEDICATIONS:  Current Outpatient Medications  Medication Sig Dispense Refill  . albuterol (PROVENTIL HFA;VENTOLIN HFA) 108 (90 BASE) MCG/ACT inhaler Inhale 2 puffs into the lungs every 6 (six) hours as needed for wheezing or shortness of breath.    . ALPRAZolam (XANAX) 0.5 MG tablet Take 0.25-0.5 mg by mouth at bedtime as needed for anxiety.     Haze Justin Probiotic (BIOGAIA PROBIOTIC) LIQD Take by mouth daily at 8 pm.    . Cholecalciferol (VITAMIN D) 50 MCG (2000 UT) CAPS Take 2,000 Units by mouth daily.     . diphenhydrAMINE (BENADRYL) 25 mg capsule Take 25 mg by mouth at bedtime as needed for sleep.    Marland Kitchen ibuprofen (ADVIL) 200 MG tablet Take 400 mg by mouth every 6 (six) hours as needed for headache or moderate pain.    Marland Kitchen letrozole (FEMARA) 2.5 MG tablet TAKE 1 TABLET BY MOUTH EVERY DAY 90 tablet 1  . Melatonin 3 MG CAPS Take 3 mg by mouth at bedtime.    . Polyethylene Glycol 3350 (MIRALAX PO) Take 17 g by mouth daily as needed.     . propranolol (INDERAL) 10 MG tablet Take 5 mg by  mouth daily as needed (for shaking hands).     . sertraline (ZOLOFT) 50 MG tablet TAKE 1 TABLET BY MOUTH EVERY DAY 90 tablet 1  . zolpidem (AMBIEN) 5 MG tablet Take 5 mg by mouth at bedtime as needed.     No current facility-administered medications for this visit.    PHYSICAL EXAMINATION: ECOG PERFORMANCE STATUS: 0 - Asymptomatic  Vitals:   07/13/19 0859  BP: 98/64  Pulse: 60  Resp: 17  Temp: 97.8 F (36.6 C)  SpO2: 100%   Filed Weights   07/13/19 0859  Weight: 140 lb (63.5 kg)    GENERAL:alert, no distress and comfortable SKIN: no rash  EYES: sclera clear LYMPH:  no palpable cervical, supraclavicular, or axillary lymphadenopathy LUNGS: clear with normal breathing effort HEART: regular rate & rhythm, no lower extremity edema NEURO: alert & oriented x 3 with fluent speech, normal gait PAC without erythema Breast exam: s/p right mastectomy with reconstruction. No mass or nodularity in either breast or axilla that I could appreciate. No nipple discharge or inversion.   LABORATORY DATA:  I have reviewed the data as listed CBC Latest Ref Rng & Units 07/13/2019 06/22/2019 06/01/2019  WBC 4.0 - 10.5 K/uL 3.1(L) 2.1(L) 2.7(L)  Hemoglobin 12.0 - 15.0  g/dL 10.7(L) 10.5(L) 10.4(L)  Hematocrit 36.0 - 46.0 % 33.4(L) 33.1(L) 32.0(L)  Platelets 150 - 400 K/uL 105(L) 87(L) 120(L)     CMP Latest Ref Rng & Units 07/13/2019 06/22/2019 06/01/2019  Glucose 70 - 99 mg/dL 89 103(H) 78  BUN 6 - 20 mg/dL _0 Creatinine 0.44 - 1.00 mg/dL 0.79 0.76 0.73  Sodium 135 - 145 mmol/L 142 143 141  Potassium 3.5 - 5.1 mmol/L 4.2 4.0 3.9  Chloride 98 - 111 mmol/L 107 107 107  CO2 22 - 32 mmol/L _1 Calcium 8.9 - 10.3 mg/dL 9.4 9.2 9.2  Total Protein 6.5 - 8.1 g/dL 7.4 6.9 7.0  Total Bilirubin 0.3 - 1.2 mg/dL 0.4 0.4 0.3  Alkaline Phos 38 - 126 U/L 108 96 94  AST 15 - 41 U/L 19 15 13(L)  ALT 0 - 44 U/L _2 RADIOGRAPHIC STUDIES: I have personally reviewed the radiological  images as listed and agreed with the findings in the report. No results found.   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 She overall had good response to chemo as seen on Breast MRI. She also has 14m right lung nodule,which has been stable. S/p right mastectomyand ALNDon 10/21/18 [facility]  -To reduce her risk of recurrenceDr. FBurr MedicochangedmaintenanceHerceptin/Perjeta to KJim Likebased on the [facility] data. Plan to complete in 08/2019.  -she plans to do left prophylactic mastectomy in the future. 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 She has discovered a stronger maternal family history of prostate and breast cancerin her 2nd and 3rd degree relatives. -We discussed the risk of future breast cancer from PALB2mu22mion, which depends on her family history. -Dr. FengStormy Card risk for other cancers in carriers for PALB2 mutations, including female breast cancer, prostate, ovarian and pancreatic cancer, although these risks have not yet been quantified. While there is an increased risk for other cancers, there are no current  guidelines for screening for these cancers, and at this time NCCN does not suggest a discussion of risk reducing salpingo-oophorectomy. -Her mother, 73, 54s recently diagnosed with breast cancertriple negative, probably PALB2 mutation related. She will proceed with b/l mastectomy in 09/2018.Her Sister had an abnormal breast MRI and is undergoing biopsy. -Anticipates having prophylactic left mastectomy, right breast reconstruction, and BSO in 2021, dates pending  3. 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 9mm 43m lung nodule  -Her initial staging PET scan showed a 9 mm solid rounded nodule within the lateral right lower lobe, not hypermetabolic. Attempted lung biopsy was not successful.  -CT chest from 10/09/18 and 01/23/19 has been stable .  -Her attempted lung biopsy was unsuccessful.   Disposition:  Ms. BagleCavanars stable.  She completed another cycle of Kadcyla, she tolerates well overall. Her echo remains stable, repeat in 10/2019. Labs adequate for treatment. She will proceed with another cycle of Kadcyla today. Plan to complete in May.   We reviewed her CT chest today which shows stable right lung nodule. No new findings. We will continue monitoring.   She plans to have reconstructive surgery and prophylactic left mastectomy in the Summer at [facility]. Her surgeon plans to obtain breast MRI prior to that. Her left mammogram in 04/2019 was negative. We will continue monthly zoladex until BSO, there is no date set. Continue letrozole, tolerating mostly well except moderate hot flashes. She is on zoloft 100 mg which is partially effective, she wishes to give this dose more time. I refilled for her.   She will return for kadcyla in 3 and 6 weeks which will be her final dose. Next zoladex in 3 weeks. F/u in 6 weeks.    No problem-specific Assessment & Plan notes found for this encounter.   No orders of the  defined types were placed in this encounter.  All questions were answered. The patient knows to call the clinic with any problems, questions or concerns. No barriers to learning was detected.    [name], NP 07/13/19

## 2019-07-13 ENCOUNTER — Encounter: Payer: Self-pay | Admitting: Nurse Practitioner

## 2019-07-13 ENCOUNTER — Inpatient Hospital Stay: Payer: BC Managed Care – PPO

## 2019-07-13 ENCOUNTER — Ambulatory Visit: Payer: BC Managed Care – PPO | Admitting: Hematology

## 2019-07-13 ENCOUNTER — Other Ambulatory Visit: Payer: Self-pay

## 2019-07-13 ENCOUNTER — Inpatient Hospital Stay (HOSPITAL_BASED_OUTPATIENT_CLINIC_OR_DEPARTMENT_OTHER): Payer: BC Managed Care – PPO | Admitting: Nurse Practitioner

## 2019-07-13 VITALS — BP 98/64 | HR 60 | Temp 97.8°F | Resp 17 | Ht 69.0 in | Wt 140.0 lb

## 2019-07-13 DIAGNOSIS — C50811 Malignant neoplasm of overlapping sites of right female breast: Secondary | ICD-10-CM

## 2019-07-13 DIAGNOSIS — Z170 Estrogen receptor positive status [ER+]: Secondary | ICD-10-CM | POA: Diagnosis not present

## 2019-07-13 DIAGNOSIS — Z95828 Presence of other vascular implants and grafts: Secondary | ICD-10-CM

## 2019-07-13 DIAGNOSIS — Z5112 Encounter for antineoplastic immunotherapy: Secondary | ICD-10-CM | POA: Diagnosis not present

## 2019-07-13 LAB — CBC WITH DIFFERENTIAL (CANCER CENTER ONLY)
Abs Immature Granulocytes: 0 10*3/uL (ref 0.00–0.07)
Basophils Absolute: 0.1 10*3/uL (ref 0.0–0.1)
Basophils Relative: 2 %
Eosinophils Absolute: 0.2 10*3/uL (ref 0.0–0.5)
Eosinophils Relative: 5 %
HCT: 33.4 % — ABNORMAL LOW (ref 36.0–46.0)
Hemoglobin: 10.7 g/dL — ABNORMAL LOW (ref 12.0–15.0)
Immature Granulocytes: 0 %
Lymphocytes Relative: 24 %
Lymphs Abs: 0.8 10*3/uL (ref 0.7–4.0)
MCH: 29.2 pg (ref 26.0–34.0)
MCHC: 32 g/dL (ref 30.0–36.0)
MCV: 91 fL (ref 80.0–100.0)
Monocytes Absolute: 0.3 10*3/uL (ref 0.1–1.0)
Monocytes Relative: 11 %
Neutro Abs: 1.8 10*3/uL (ref 1.7–7.7)
Neutrophils Relative %: 58 %
Platelet Count: 105 10*3/uL — ABNORMAL LOW (ref 150–400)
RBC: 3.67 MIL/uL — ABNORMAL LOW (ref 3.87–5.11)
RDW: 15.6 % — ABNORMAL HIGH (ref 11.5–15.5)
WBC Count: 3.1 10*3/uL — ABNORMAL LOW (ref 4.0–10.5)
nRBC: 0 % (ref 0.0–0.2)

## 2019-07-13 LAB — CMP (CANCER CENTER ONLY)
ALT: 25 U/L (ref 0–44)
AST: 19 U/L (ref 15–41)
Albumin: 4.1 g/dL (ref 3.5–5.0)
Alkaline Phosphatase: 108 U/L (ref 38–126)
Anion gap: 8 (ref 5–15)
BUN: 12 mg/dL (ref 6–20)
CO2: 27 mmol/L (ref 22–32)
Calcium: 9.4 mg/dL (ref 8.9–10.3)
Chloride: 107 mmol/L (ref 98–111)
Creatinine: 0.79 mg/dL (ref 0.44–1.00)
GFR, Est AFR Am: >60 mL/min (ref >60)
GFR, Estimated: >60 mL/min (ref >60)
Glucose, Bld: 89 mg/dL (ref 70–99)
Potassium: 4.2 mmol/L (ref 3.5–5.1)
Sodium: 142 mmol/L (ref 135–145)
Total Bilirubin: 0.4 mg/dL (ref 0.3–1.2)
Total Protein: 7.4 g/dL (ref 6.5–8.1)

## 2019-07-13 MED ORDER — SODIUM CHLORIDE 0.9 % IV SOLN
3.6000 mg/kg | Freq: Once | INTRAVENOUS | Status: AC
  Administered 2019-07-13: 220 mg via INTRAVENOUS
  Filled 2019-07-13: qty 5

## 2019-07-13 MED ORDER — HEPARIN SOD (PORK) LOCK FLUSH 100 UNIT/ML IV SOLN
500.0000 [IU] | Freq: Once | INTRAVENOUS | Status: AC | PRN
  Administered 2019-07-13: 500 [IU]
  Filled 2019-07-13: qty 5

## 2019-07-13 MED ORDER — SODIUM CHLORIDE 0.9 % IV SOLN
Freq: Once | INTRAVENOUS | Status: AC
  Filled 2019-07-13: qty 250

## 2019-07-13 MED ORDER — SODIUM CHLORIDE 0.9% FLUSH
10.0000 mL | INTRAVENOUS | Status: DC | PRN
  Administered 2019-07-13: 10 mL
  Filled 2019-07-13: qty 10

## 2019-07-13 MED ORDER — ACETAMINOPHEN 325 MG PO TABS
650.0000 mg | ORAL_TABLET | Freq: Once | ORAL | Status: AC
  Administered 2019-07-13: 650 mg via ORAL

## 2019-07-13 MED ORDER — DIPHENHYDRAMINE HCL 25 MG PO CAPS
ORAL_CAPSULE | ORAL | Status: AC
  Filled 2019-07-13: qty 2

## 2019-07-13 MED ORDER — DIPHENHYDRAMINE HCL 25 MG PO CAPS
50.0000 mg | ORAL_CAPSULE | Freq: Once | ORAL | Status: AC
  Administered 2019-07-13: 50 mg via ORAL

## 2019-07-13 MED ORDER — ACETAMINOPHEN 325 MG PO TABS
ORAL_TABLET | ORAL | Status: AC
  Filled 2019-07-13: qty 2

## 2019-07-13 NOTE — Patient Instructions (Signed)
[facility] Discharge Instructions for Patients Receiving Chemotherapy  Today you received the following chemotherapy agents: ado-trastuzumab emtansine (Kadcyla)  To help prevent nausea and vomiting after your treatment, we encourage you to take your nausea medication as directed.   If you develop nausea and vomiting that is not controlled by your nausea medication, call the clinic.   BELOW ARE SYMPTOMS THAT SHOULD BE REPORTED IMMEDIATELY:  *FEVER GREATER THAN 100.5 F  *CHILLS WITH OR WITHOUT FEVER  NAUSEA AND VOMITING THAT IS NOT CONTROLLED WITH YOUR NAUSEA MEDICATION  *UNUSUAL SHORTNESS OF BREATH  *UNUSUAL BRUISING OR BLEEDING  TENDERNESS IN MOUTH AND THROAT WITH OR WITHOUT PRESENCE OF ULCERS  *URINARY PROBLEMS  *BOWEL PROBLEMS  UNUSUAL RASH Items with * indicate a potential emergency and should be followed up as soon as possible.  Feel free to call the clinic should you have any questions or concerns. The clinic phone number is [phone].  Please show the [place] at check-in to the Emergency Department and triage nurse.

## 2019-07-14 ENCOUNTER — Telehealth: Payer: Self-pay | Admitting: Nurse Practitioner

## 2019-07-14 NOTE — Telephone Encounter (Signed)
Scheduled appt per 3/29 los.

## 2019-07-21 ENCOUNTER — Other Ambulatory Visit: Payer: Self-pay

## 2019-07-21 ENCOUNTER — Ambulatory Visit
Admission: RE | Admit: 2019-07-21 | Discharge: 2019-07-21 | Disposition: A | Discharge status: Home / Self Care | Payer: BC Managed Care – PPO | Source: Ambulatory Visit | Attending: Nurse Practitioner | Admitting: Nurse Practitioner

## 2019-07-21 DIAGNOSIS — E2839 Other primary ovarian failure: Secondary | ICD-10-CM

## 2019-07-22 ENCOUNTER — Encounter: Payer: Self-pay | Admitting: Hematology

## 2019-07-28 NOTE — Progress Notes (Signed)
Pharmacist Chemotherapy Monitoring - Follow Up Assessment    I verify that I have reviewed each item in the below checklist:  . Regimen for the patient is scheduled for the appropriate day and plan matches scheduled date. Marland Kitchen Appropriate non-routine labs are ordered dependent on drug ordered. . If applicable, additional medications reviewed and ordered per protocol based on lifetime cumulative doses and/or treatment regimen.   Plan for follow-up and/or issues identified: No . I-vent associated with next due treatment: No . MD and/or nursing notified: No  Kim [name] [facility] 07/28/2019 8:39 AM

## 2019-08-03 ENCOUNTER — Inpatient Hospital Stay: Payer: BC Managed Care – PPO

## 2019-08-03 ENCOUNTER — Inpatient Hospital Stay: Payer: BC Managed Care – PPO | Attending: Hematology

## 2019-08-03 ENCOUNTER — Other Ambulatory Visit: Payer: Self-pay

## 2019-08-03 VITALS — BP 99/69 | HR 57 | Temp 98.0°F | Resp 17 | Wt 136.5 lb

## 2019-08-03 DIAGNOSIS — Z170 Estrogen receptor positive status [ER+]: Secondary | ICD-10-CM

## 2019-08-03 DIAGNOSIS — Z95828 Presence of other vascular implants and grafts: Secondary | ICD-10-CM

## 2019-08-03 DIAGNOSIS — Z5112 Encounter for antineoplastic immunotherapy: Secondary | ICD-10-CM | POA: Diagnosis not present

## 2019-08-03 DIAGNOSIS — Z5111 Encounter for antineoplastic chemotherapy: Secondary | ICD-10-CM | POA: Insufficient documentation

## 2019-08-03 DIAGNOSIS — C50811 Malignant neoplasm of overlapping sites of right female breast: Secondary | ICD-10-CM | POA: Diagnosis present

## 2019-08-03 DIAGNOSIS — Z5189 Encounter for other specified aftercare: Secondary | ICD-10-CM | POA: Insufficient documentation

## 2019-08-03 LAB — CMP (CANCER CENTER ONLY)
ALT: 27 U/L (ref 0–44)
AST: 19 U/L (ref 15–41)
Albumin: 3.9 g/dL (ref 3.5–5.0)
Alkaline Phosphatase: 95 U/L (ref 38–126)
Anion gap: 6 (ref 5–15)
BUN: 14 mg/dL (ref 6–20)
CO2: 27 mmol/L (ref 22–32)
Calcium: 9 mg/dL (ref 8.9–10.3)
Chloride: 108 mmol/L (ref 98–111)
Creatinine: 0.72 mg/dL (ref 0.44–1.00)
GFR, Est AFR Am: >60 mL/min (ref >60)
GFR, Estimated: >60 mL/min (ref >60)
Glucose, Bld: 74 mg/dL (ref 70–99)
Potassium: 4.1 mmol/L (ref 3.5–5.1)
Sodium: 141 mmol/L (ref 135–145)
Total Bilirubin: 0.4 mg/dL (ref 0.3–1.2)
Total Protein: 7 g/dL (ref 6.5–8.1)

## 2019-08-03 LAB — CBC WITH DIFFERENTIAL (CANCER CENTER ONLY)
Abs Immature Granulocytes: 0 10*3/uL (ref 0.00–0.07)
Basophils Absolute: 0 10*3/uL (ref 0.0–0.1)
Basophils Relative: 2 %
Eosinophils Absolute: 0.1 10*3/uL (ref 0.0–0.5)
Eosinophils Relative: 5 %
HCT: 32.5 % — ABNORMAL LOW (ref 36.0–46.0)
Hemoglobin: 10.4 g/dL — ABNORMAL LOW (ref 12.0–15.0)
Immature Granulocytes: 0 %
Lymphocytes Relative: 30 %
Lymphs Abs: 0.6 10*3/uL — ABNORMAL LOW (ref 0.7–4.0)
MCH: 29.5 pg (ref 26.0–34.0)
MCHC: 32 g/dL (ref 30.0–36.0)
MCV: 92.1 fL (ref 80.0–100.0)
Monocytes Absolute: 0.2 10*3/uL (ref 0.1–1.0)
Monocytes Relative: 11 %
Neutro Abs: 1 10*3/uL — ABNORMAL LOW (ref 1.7–7.7)
Neutrophils Relative %: 52 %
Platelet Count: 78 10*3/uL — ABNORMAL LOW (ref 150–400)
RBC: 3.53 MIL/uL — ABNORMAL LOW (ref 3.87–5.11)
RDW: 15.8 % — ABNORMAL HIGH (ref 11.5–15.5)
WBC Count: 1.9 10*3/uL — ABNORMAL LOW (ref 4.0–10.5)
nRBC: 0 % (ref 0.0–0.2)

## 2019-08-03 MED ORDER — GOSERELIN ACETATE 3.6 MG ~~LOC~~ IMPL
3.6000 mg | DRUG_IMPLANT | Freq: Once | SUBCUTANEOUS | Status: AC
  Administered 2019-08-03: 3.6 mg via SUBCUTANEOUS

## 2019-08-03 MED ORDER — ACETAMINOPHEN 325 MG PO TABS
650.0000 mg | ORAL_TABLET | Freq: Once | ORAL | Status: AC
  Administered 2019-08-03: 650 mg via ORAL

## 2019-08-03 MED ORDER — SODIUM CHLORIDE 0.9 % IV SOLN
3.6000 mg/kg | Freq: Once | INTRAVENOUS | Status: AC
  Administered 2019-08-03: 220 mg via INTRAVENOUS
  Filled 2019-08-03: qty 8

## 2019-08-03 MED ORDER — SODIUM CHLORIDE 0.9% FLUSH
10.0000 mL | INTRAVENOUS | Status: DC | PRN
  Administered 2019-08-03: 10 mL
  Filled 2019-08-03: qty 10

## 2019-08-03 MED ORDER — DIPHENHYDRAMINE HCL 25 MG PO CAPS
50.0000 mg | ORAL_CAPSULE | Freq: Once | ORAL | Status: AC
  Administered 2019-08-03: 10:00:00 50 mg via ORAL

## 2019-08-03 MED ORDER — SODIUM CHLORIDE 0.9 % IV SOLN
Freq: Once | INTRAVENOUS | Status: AC
  Filled 2019-08-03: qty 250

## 2019-08-03 MED ORDER — ACETAMINOPHEN 325 MG PO TABS
ORAL_TABLET | ORAL | Status: AC
  Filled 2019-08-03: qty 2

## 2019-08-03 MED ORDER — HEPARIN SOD (PORK) LOCK FLUSH 100 UNIT/ML IV SOLN
500.0000 [IU] | Freq: Once | INTRAVENOUS | Status: AC | PRN
  Administered 2019-08-03: 500 [IU]
  Filled 2019-08-03: qty 5

## 2019-08-03 MED ORDER — GOSERELIN ACETATE 3.6 MG ~~LOC~~ IMPL
DRUG_IMPLANT | SUBCUTANEOUS | Status: AC
  Filled 2019-08-03: qty 3.6

## 2019-08-03 MED ORDER — DIPHENHYDRAMINE HCL 25 MG PO CAPS
ORAL_CAPSULE | ORAL | Status: AC
  Filled 2019-08-03: qty 2

## 2019-08-03 NOTE — Progress Notes (Signed)
Dr. Burr Medico okay to proceed with treatment today with ANC 1.0 and plt 78.

## 2019-08-05 ENCOUNTER — Encounter: Payer: Self-pay | Admitting: Hematology

## 2019-08-05 ENCOUNTER — Inpatient Hospital Stay: Payer: BC Managed Care – PPO

## 2019-08-05 ENCOUNTER — Other Ambulatory Visit: Payer: Self-pay

## 2019-08-05 VITALS — BP 102/78 | HR 62 | Temp 98.2°F | Resp 18

## 2019-08-05 DIAGNOSIS — Z5112 Encounter for antineoplastic immunotherapy: Secondary | ICD-10-CM | POA: Diagnosis not present

## 2019-08-05 DIAGNOSIS — Z95828 Presence of other vascular implants and grafts: Secondary | ICD-10-CM

## 2019-08-05 MED ORDER — GOSERELIN ACETATE 3.6 MG ~~LOC~~ IMPL
DRUG_IMPLANT | SUBCUTANEOUS | Status: AC
  Filled 2019-08-05: qty 3.6

## 2019-08-05 MED ORDER — GOSERELIN ACETATE 3.6 MG ~~LOC~~ IMPL: 3.6000 mg | DRUG_IMPLANT | Freq: Once | SUBCUTANEOUS | Status: DC

## 2019-08-05 MED ORDER — PEGFILGRASTIM-CBQV 6 MG/0.6ML ~~LOC~~ SOSY
6.0000 mg | PREFILLED_SYRINGE | Freq: Once | SUBCUTANEOUS | Status: AC
  Administered 2019-08-05: 6 mg via SUBCUTANEOUS

## 2019-08-05 NOTE — Patient Instructions (Signed)

## 2019-08-05 NOTE — Progress Notes (Signed)
Discontinued Zoladex released today. It was given on 08/03/19 and released today by RN unintentionally.

## 2019-08-05 NOTE — Progress Notes (Signed)
Notified pharmacy that the zoladex was released by accident and they DC.

## 2019-08-18 NOTE — Progress Notes (Signed)
Pharmacist Chemotherapy Monitoring - Follow Up Assessment    I verify that I have reviewed each item in the below checklist:  . Regimen for the patient is scheduled for the appropriate day and plan matches scheduled date. Marland Kitchen Appropriate non-routine labs are ordered dependent on drug ordered. . If applicable, additional medications reviewed and ordered per protocol based on lifetime cumulative doses and/or treatment regimen.   Plan for follow-up and/or issues identified: No . I-vent associated with next due treatment: No . MD and/or nursing notified: No  Kim [name] [facility] 08/18/2019 8:22 AM

## 2019-08-21 NOTE — Progress Notes (Signed)
[place]   Telephone:([phone] [phone]) [phone]   Clinic Follow up Note   Patient Care Team: [name], MD as PCP - General (Family Medicine) [name], MD as Consulting Physician (General Surgery) [name], MD as Consulting Physician (Hematology) [name], MD as Consulting Physician (Radiation Oncology) [name], MD as Consulting Physician (Obstetrics and Gynecology) [name], RN as Oncology Nurse Navigator [name], RN as Oncology Nurse Navigator [name], NP as Nurse Practitioner (Nurse Practitioner)  Date of Service:  08/24/2019  CHIEF COMPLAINT: F/u multifocal right breast cancer  SUMMARY OF ONCOLOGIC HISTORY: Oncology History Overview Note  Cancer Staging Cancer of overlapping sites of right female breast [facility]) Staging form: Breast, AJCC 8th Edition - Clinical stage from 05/28/2018: Stage IB (cT2, cN1, cM0, G3, ER+, PR+, HER2+) - Signed by [name], MD on 05/28/2018  Malignant neoplasm of lower-outer quadrant of right breast of female, estrogen receptor positive ([place]) Staging form: Breast, AJCC 8th Edition - Clinical stage from 05/28/2018: Stage IIB (cT2, cN1, cM0, G3, ER+, PR+, HER2-) - Unsigned     Cancer of overlapping sites of right female breast ([place])  05/14/2018 Mammogram   Right breast mammogram 05/14/18  IMPRESSION  There is a 1.0 x 0.5 x 1.2 cm lobular hypoechoic mass right breast 7 o'clock position 3 cm from nipple, a 0.8 x 0.6 x 1.2 cm lobular hypoechoic mass right breast 7 o'clock position 2 cm from the nipple, and a 0.9 x 0.5 x 0.8 cm lobular hypoechoic mass right breast 5 o'clock position 3 cm from nipple.  There is a 3.8 x 1.0 x 1.3 cm lobular hypoechoic mass right breast 5 o'clock position 2 cm from nipple.  Multiple thickened right axillary lymph nodes (approximately 15). Measuring up to 1.0 cm in thickness. Adenopathy corresponds with axillary palpable abnormality.  Indeterminate  round and punctate calcifications upper-outer quadrant right breast.Indeterminate round and punctate calcifications upper-outer quadrant right breast.   05/21/2018 Initial Biopsy   Diagnosis 05/21/18  1. Breast, right, needle core biopsy, axilla, 5 o'clock, ribbon clip - INVASIVE MAMMARY CARCINOMA. - MAMMARY CARCINOMA IN SITU. 2. Breast, right, needle core biopsy, 7 o'clock, coil clip - INVASIVE MAMMARY CARCINOMA. - MAMMARY CARCINOMA IN SITU. 3. Lymph node, needle/core biopsy, right axillary LN, hydromark - METASTATIC MAMMARY CARCINOMA.    05/21/2018 Receptors her2   1. PROGNOSTIC INDICATORS Results: The tumor cells are EQUIVOCAL for Her2 (2+);  HER2 **NEGATIVE** by FISH   Estrogen Receptor: 80%, POSITIVE, MODERATE STAINING INTENSITY Progesterone Receptor: 5%, POSITIVE, MODERATE-WEAK STAINING INTENSITY Proliferation Marker Ki67: 10%  3. PROGNOSTIC INDICATORS Results: The tumor cells are POSITIVE for Her2 (3+). Estrogen Receptor: 90%, POSITIVE, STRONG STAINING INTENSITY Progesterone Receptor: 50%, POSITIVE, MODERATE STAINING INTENSITY Proliferation Marker Ki67: 5%   05/26/2018 Initial Diagnosis   Malignant neoplasm of lower-inner quadrant of right breast of female, estrogen receptor positive ([place])   05/27/2018 Mammogram   Left breast mammogram 05/27/18  IMPRESSION: No mammographic evidence of LEFT breast malignancy.   05/28/2018 Cancer Staging   Staging form: Breast, AJCC 8th Edition - Clinical stage from 05/28/2018: Stage IB (cT2, cN1, cM0, G3, ER+, PR+, HER2+) - Signed by [name], MD on 05/28/2018   06/03/2018 Imaging   MR BREAST BILATERAL W WO CONTRAST INC CAD  IMPRESSION: 1. Large enhancing masses and non mass enhancement identified throughout the majority of the right breast. Additionally, there are smaller masses identified within the upper inner and lower inner right breast. Overall findings are  concerning for extensive malignancy throughout all 4 quadrants of the right  breast. 2. Extensive bulky lymphadenopathy identified within the right axilla. The matted appearance makes counting the number of abnormal lymph nodes difficult as they are immediately approximated to each other. 3. At least one abnormal appearing right internal mammary lymph node. 4. Nonenhancing lesion within the sternum with associated sternal marrow heterogeneity, raising the possibility of osseous metastatic disease within the sternum. 5. Two enhancing masses within the left breast as described above.     06/05/2018 PET scan   NM PET Image Initial (PI) Skull Base To Thigh  IMPRESSION: 1. Multiple borderline enlarged right axillary lymph nodes identified exhibiting mild to moderate increased uptake. Can not rule out nodal metastasis. Correlation with biopsy recommended. 2. Asymmetric increased uptake within the right breast corresponding to MRI abnormality. 3. Subcentimeter right retropectoral and right internal mammary lymph nodes are again identified and exhibit nonspecific, low level FDG uptake. 4. Noncalcified solid nodule within the lateral right lower lobe measures 9 mm without significant radiotracer uptake. 5. No hypermetabolic osseous lesions identified.     06/09/2018 - 11/03/2018 Chemotherapy   Neoadjuvant chemotherapy TCHP, 06/09/2018-09/22/18. Followed by Maintenance Herceptin/Perjeta q3weeks starting 10/14/18 to continue 1 year treatment. Stopped Herceptin/Perjeta on 11/03/18 due to multiple Positive LN of pathology.    06/10/2018 Pathology Results   Surgical pathology  Diagnosis 1. Breast, left, needle core biopsy, LOQ, barbell - MILD FIBROCYSTIC CHANGES. 2. Breast, left, needle core biopsy, UIQ cylinder - MILD FIBROCYSTIC CHANGES.    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 There were no  deleterious mutations in AIP, ALK, APC, ATM, AXIN2,BAP1,  BARD1, BLM, BMPR1A, BRCA1, BRCA2, BRIP1, CASR, CDC73, CDH1, CDK4, CDKN1B, CDKN1C, CDKN2A (p14ARF), CDKN2A (p16INK4a), CEBPA, CHEK2, CTNNA1, DICER1, DIS3L2, EGFR (c.2369C>T, p.Thr790Met variant only), EPCAM (Deletion/duplication testing only), FH, FLCN, GATA2, GPC3, GREM1 (Promoter region deletion/duplication testing only), HOXB13 (c.251G>A, p.Gly84Glu), HRAS, KIT, MAX, MEN1, MET, MITF (c.952G>A, p.Glu318Lys variant only), MLH1, MSH2, MSH3, MSH6, MUTYH, NBN, NF1, NF2, NTHL1, PDGFRA, PHOX2B, PMS2, POLD1, POLE, POT1, PRKAR1A, PTCH1, PTEN, RAD50, RAD51C, RAD51D, RB1, RECQL4, RET, RUNX1, SDHAF2, SDHA (sequence changes only), SDHB, SDHC, SDHD, SMAD4, SMARCA4, SMARCB1, SMARCE1, STK11, SUFU, TERC, TERT, TMEM127, TP53, TSC1, TSC2, VHL, WRN and WT1.  .   09/24/2018 Breast MRI   Breast [facility] from [facility] on 09/24/18 Right breast: Signal void inferior right breast, posterior depth from biopsy marker clip. Interval decreased size of the right breast, and now symmetric to the left, compared to prior MRI. Resolution of abnormal nonmass enhancement without evidence of residual enhancing mass. No evidence of axillary or internal mammary lymphadenopathy is seen, markedly improved from prior. There is no abnormal skin, nipple, or pectoralis muscle enhancement.  Left breast: There are no suspicious enhancing masses or areas of non-mass enhancement. No evidence of axillary or internal mammary lymphadenopathy is seen. There is no abnormal skin, nipple, or pectoralis muscle enhancement.   10/09/2018 Imaging   CT chest W Contrast 10/09/18  IMPRESSION: 1. Stable 10 mm right lower lobe pulmonary nodule since 06/05/2018. Nodule is not substantially hypermetabolic on the PET-CT. Continued attention on follow-up recommended. 2. No findings to suggest metastatic disease in the thorax.   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 surrounding tissue - Biopsy clips identified - Margin status (see extended anterior margin below)  Invasive carcinoma: Positive anterior-inferior margin  Ductal carcinoma in situ: Positive anterior-inferior margin - Ancillary studies previously reported on [id number](5:00, ribbon clip) Estrogen receptor: Positive (80%) Progesterone receptor: Positive (10%) HER2 IHC: Equivocal (2+) HER2 FISH: Negative HER2/CEP17 ratio: 1.35 Mean HER2 copy number: 1.55 - Ancillary studies previously reported on [id number] (right axillary lymph node) Estrogen receptor: Positive (90%) Progesterone receptor: Positive (20%) HER2 IHC: Positive (3+) - Residual Cancer Burden  Tumor bed size: 41 mm x 20 mm Overall tumor cellularity: 10% Percentage in situ: 60% Number of involved lymph nodes: 7 Diameter of largest metastasis: 6 mm Residual Cancer Burden Score: 3.108 Residual Cancer Burden Class: RCB-II - Other findings: Atypical lobular hyperplasia and columnar cell change with microcalcifications; fibroadenomas with microcalcifications  B: Right axilla, targeted lymph node excision - Six of seven lymph nodes positive for carcinoma (6/7) - Size of largest tumor deposit: 6 mm - Biopsy site identified - Treatment effect: Present - Extracapsular extension: Absent  C: Right axilla, lymph node dissection - One of seven lymph nodes positive for carcinoma, macrometastasis, 6 mm (1/7) - Treatment effect: Absent - Extracapsular extension: Absent  D: Right breast, nipple core biopsy - Negative for carcinoma  E: Breast, right, anterior margin, excision - Microscopic foci of ductal carcinoma in situ - Margin: Negative, <1 mm    11/05/2018  Cancer Staging   Staging form: Breast, AJCC 8th Edition - Pathologic stage from 11/05/2018: No Stage Recommended (ypT1c, pN2a, cM0, G2, ER+, PR+, HER2-) - Signed by [name], MD on 11/23/2018   11/24/2018 - 08/24/2019 Chemotherapy   Due to positive LN on surgical pathology her maintance Herceptin/Perjeta was changed to [place] for about 14 cycles on 11/24/18. Completed on 08/24/19   12/24/2018 - 02/03/2019 Radiation Therapy   adjuvant radiation at [facility] on 12/24/18 and completed on 02/03/19.    01/23/2019 Imaging   CT Chest wo  IMPRESSION: 1. Stable right lower lobe pulmonary nodule. 2. No signs of new or suspicious finding since study of 10/09/2018, now status post right mastectomy, axillary dissection and breast reconstruction.   02/16/2019 -  Anti-estrogen oral therapy   -Monthly Zoladex injection started 12/19/18 -Letrozole 2.5 mg started 02/16/19    04/20/2019 Survivorship   SCP delivered by Cira Rue, NP       CURRENT THERAPY:  -Due to positive LN on surgical pathology hermaintenanceHerceptin/Perjeta was changed to Kadcyla q3weeks for about 14 cycles on 11/24/18. Plan to complete 08/24/19. -Monthly Zoladex injection started 12/19/18 -Letrozole 2.5 mg started 02/16/19 -PENDING Nerlynx   INTERVAL HISTORY:  Kim [name] is here for a follow up. She presents to the clinic alone. She notes she is doing well and denies any new changes. She notes she has received her COVID19 vaccines in the past 3 months.She has a localized skin reaction. 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 She notes her main side effects is fatigue from treatment. She notes she is seeing PT for right axillary nerve pain from surgery. She notes she has constipation and is taking stool softener for this. She plans to do breast reconstruction then ovarian surgery after completing treatment.     REVIEW OF SYSTEMS:   Constitutional: Denies fevers, chills  or abnormal weight loss (+)  Fatigue  Eyes: Denies blurriness of vision Ears, nose, mouth, throat, and face: Denies mucositis or sore throat Respiratory: Denies cough, dyspnea or wheezes Cardiovascular: Denies palpitation, chest discomfort or lower extremity swelling Gastrointestinal:  Denies nausea, heartburn (+) Constipation Skin: Denies abnormal skin rashes Lymphatics: Denies new lymphadenopathy or easy bruising Neurological:Denies numbness, tingling or new weaknesses Behavioral/Psych: Mood is stable, no new changes  All other systems were reviewed with the patient and are negative.  MEDICAL HISTORY:  Past Medical History:  Diagnosis Date  . Acid reflux   . Acute bronchitis   . Anemia   . Asthma   . Family history of brain cancer   . Family history of breast cancer   . Family history of prostate cancer   . Migraine     SURGICAL HISTORY: Past Surgical History:  Procedure Laterality Date  . BREAST BIOPSY Right 05/23/2018   x3, malignant  . DILATION AND CURETTAGE OF UTERUS     2006 x1, 2009 x2  . LAPAROTOMY  2008  . PORTACATH PLACEMENT Left 06/06/2018   Procedure: INSERTION PORT-A-CATH POSSIBLE ULTRASOUND;  Surgeon: [name], MD;  Location: [place];  Service: General;  Laterality: Left;    I have reviewed the social history and family history with the patient and they are unchanged from previous note.  ALLERGIES:  has No Known Allergies.  MEDICATIONS:  Current Outpatient Medications  Medication Sig Dispense Refill  . albuterol (PROVENTIL HFA;VENTOLIN HFA) 108 (90 BASE) MCG/ACT inhaler Inhale 2 puffs into the lungs every 6 (six) hours as needed for wheezing or shortness of breath.    . ALPRAZolam (XANAX) 0.5 MG tablet Take 0.25-0.5 mg by mouth at bedtime as needed for anxiety.     Haze Justin Probiotic (BIOGAIA PROBIOTIC) LIQD Take by mouth daily at 8 pm.    . Cholecalciferol (VITAMIN D) 50 MCG (2000 UT) CAPS Take 2,000 Units by mouth daily.     . diphenhydrAMINE (BENADRYL) 25  mg capsule Take 25 mg by mouth at bedtime as needed for sleep.    Marland Kitchen ibuprofen (ADVIL) 200 MG tablet Take 400 mg by mouth every 6 (six) hours as needed for headache or moderate pain.    Marland Kitchen letrozole (FEMARA) 2.5 MG tablet TAKE 1 TABLET BY MOUTH EVERY DAY 90 tablet 1  . Melatonin 3 MG CAPS Take 3 mg by mouth at bedtime.    . Neratinib Maleate (NERLYNX) 40 MG tablet Take 6 tablets (240 mg total) by mouth daily. Take with food. Start at 4 tabs daily for first week, if tolerates well, increase by 1 tab daily by one week until full dose 180 tablet 0  . Polyethylene Glycol 3350 (MIRALAX PO) Take 17 g by mouth daily as needed.     . propranolol (INDERAL) 10 MG tablet Take 5 mg by mouth daily as needed (for shaking hands).     . sertraline (ZOLOFT) 50 MG tablet TAKE 1 TABLET BY MOUTH EVERY DAY 90 tablet 1  . zolpidem (AMBIEN) 5 MG tablet Take 5 mg by mouth at bedtime as needed.     No current facility-administered medications for this visit.    PHYSICAL EXAMINATION: ECOG PERFORMANCE STATUS: 0 - Asymptomatic  Vitals:   08/24/19 0859  BP: 95/70  Pulse: (!) 57  Resp: 17  Temp: 98.3 F (36.8 C)  SpO2: 100%   Filed Weights   08/24/19 0859  Weight: 137 lb 12.8 oz (62.5 kg)  GENERAL:alert, no distress and comfortable SKIN: skin color, texture, turgor are normal, no rashes or significant lesions EYES: normal, Conjunctiva are pink and non-injected, sclera clear  NECK: supple, thyroid normal size, non-tender, without nodularity LYMPH:  no palpable lymphadenopathy in the cervical, axillary  LUNGS: clear to auscultation and percussion with normal breathing effort HEART: regular rate & rhythm and no murmurs and no lower extremity edema ABDOMEN:abdomen soft, non-tender and normal bowel sounds Musculoskeletal:no cyanosis of digits and no clubbing  NEURO: alert & oriented x 3 with fluent speech, no focal motor/sensory deficits  LABORATORY DATA:  I have reviewed the data as listed CBC Latest Ref  Rng & Units 08/24/2019 08/03/2019 07/13/2019  WBC 4.0 - 10.5 K/uL 3.0(L) 1.9(L) 3.1(L)  Hemoglobin 12.0 - 15.0 g/dL 11.1(L) 10.4(L) 10.7(L)  Hematocrit 36.0 - 46.0 % 35.1(L) 32.5(L) 33.4(L)  Platelets 150 - 400 K/uL 111(L) 78(L) 105(L)     CMP Latest Ref Rng & Units 08/24/2019 08/03/2019 07/13/2019  Glucose 70 - 99 mg/dL 93 74 89  BUN 6 - 20 mg/dL '14 14 12  '$ Creatinine 0.44 - 1.00 mg/dL 0.77 0.72 0.79  Sodium 135 - 145 mmol/L 144 141 142  Potassium 3.5 - 5.1 mmol/L 4.1 4.1 4.2  Chloride 98 - 111 mmol/L 106 108 107  CO2 22 - 32 mmol/L '27 27 27  '$ Calcium 8.9 - 10.3 mg/dL 9.6 9.0 9.4  Total Protein 6.5 - 8.1 g/dL 7.6 7.0 7.4  Total Bilirubin 0.3 - 1.2 mg/dL 0.4 0.4 0.4  Alkaline Phos 38 - 126 U/L 114 95 108  AST 15 - 41 U/L '18 19 19  '$ ALT 0 - 44 U/L '25 27 25      '$ RADIOGRAPHIC STUDIES: I have personally reviewed the radiological images as listed and agreed with the findings in the report. No results found.   ASSESSMENT & PLAN:  Josephyne Tarter is a 44 y.o. 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 -She plans to have reconstructive surgery and prophylactic left mastectomy in the Summer 2021 at [facility]. Her surgeon plans to obtain breast MRI prior to that. I recommend she have PAC removed with surgery.   -Given her ER/PR positive disease I started her on antiestrogen therapy with monthly Zoladex injection in 12/2018 and on Letrozole in 02/2019.Tolerating well with manageable hot flashes and mood swings, which has improved with Zoloft.  -Due to positive LNs she is on adjuvant Kadcylaq3weeks to complete 14 cycles adjuvant  treatment dueto her high risk disease.  -She is clinically stable. Labs reviewed and adequate to proceed with last dose Kadcyla today.  -I discussed the option of another oral Anti-HER2 treatment with oral Nerlynx. I reviewed the main side effect is diarrhea which imodium and Budesonide is recommended. I discussed titration up to full dose from 4 tabs daily week 1, 5 tabs for week 2 and 6 tabs the week 3. Would start at least 1 month after completing current treatment. I gave her print out of medication. She is interested.  -Will proceed with Zoladex infusion on 5/17 and continue every 4 weeks  -Continue Letrozole daily.  -F/u in 5 weeks    2.Genetics:PALB69mtation+ -Genetic testingfrom [facility] done 05/2018 was positive for PALB278mation. She has discovered a stronger maternal family history  of prostate and breast cancerin her1st,2nd and 3rd degree relatives, Including her mother who was recently diagnosed. -We discussed the risk of future breast cancer from PALB56mtation. Given her familyhistory, she plans to doprophylactic left mastectomy and [facility]  -While there is an increased risk for other cancers, there are no current guidelines for screening for these cancers, and at this time NCCN does not suggest a discussion of risk reducing salpingo-oophorectomy. -She has talked to GYN about her BSOand Hysterectomy in late 2021.   3. Asthma  -mild, continuealbuterol inhaler as needed -Ipreviouslydiscussed she is at high risk for infection.If she develops fever or productive cough with phlegm she can contact our clinic. 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  -She has received both her COVID19 vaccines. I discussed she can participate in boost shot. I encouraged her to continue  COVID19 precautions.   5. 927mRLL lung nodule  -Her initial2/20/20PET showed a 9 mm solid rounded nodule within the lateral right lower lobe, not hypermetabolic -Her attempted lung biopsy was unsuccessful. Will monitor -Her 07/10/19 CT chest showed stable lung nodule, no new findings. Will continue to monitor for 2 years.  6.Insomnia, Stress/Anxiety -Shehas been having insomnia issue since cancer diagnosis, shenotes benadryl has not worked and she has been on melatonin for years.  -She currently uses half to whole Ambien or half xanax to get her to sleep. -She does have mood swings and hot flashes from Letrozole. She feels she is managing her mood and any anxiety/depression well with exercise, coping tools and she has a good support system. But feels at night or napping her stress and mood get to her.  -To help her mood, sleep and hot flashes, I called in SSRI Zoloft (03/30/19). She responded well and her sleep has improved.   7. Osteopenia  -I reviewed AI such as letrozole can weaken her bone  -I recommend she start Vitrain D OTC daily. Will monitor her Vit D Level. -Her baseline DEXA from 07/21/19 shows osteopenia with lowest T-score -1.3 at left femur neck. Will monitor on AI with repeat scan every 2 years.    Plan -I called in Nerlynx and Budesonide today, plan to start in 4 weeks  -Labs reviewed and adequate to proceed with final Kadcyla today  -Continue Letrozole  -Zoladex injection every 4 weeks. Next on 5/17 -Lab, Zoladex injection and f/u in 5 weeks     No problem-specific Assessment & Plan notes found for this encounter.   No orders of the defined types were placed in this encounter.  All questions were answered. The patient knows to call the clinic with any problems, questions or concerns. No barriers to learning was detected. The total time spent in the appointment was 30 minutes.     YaTruitt MerleMD 08/24/2019   I, AmJoslyn Devonam acting as scribe for YaTruitt MerleMD.   I have reviewed the above documentation for accuracy and completeness, and I agree with the above.

## 2019-08-24 ENCOUNTER — Telehealth: Payer: Self-pay

## 2019-08-24 ENCOUNTER — Inpatient Hospital Stay: Payer: BC Managed Care – PPO

## 2019-08-24 ENCOUNTER — Encounter: Payer: Self-pay | Admitting: Hematology

## 2019-08-24 ENCOUNTER — Telehealth: Payer: Self-pay | Admitting: Pharmacist

## 2019-08-24 ENCOUNTER — Inpatient Hospital Stay (HOSPITAL_BASED_OUTPATIENT_CLINIC_OR_DEPARTMENT_OTHER): Payer: BC Managed Care – PPO | Admitting: Hematology

## 2019-08-24 ENCOUNTER — Inpatient Hospital Stay: Payer: BC Managed Care – PPO | Attending: Hematology

## 2019-08-24 ENCOUNTER — Other Ambulatory Visit: Payer: Self-pay

## 2019-08-24 VITALS — BP 95/70 | HR 57 | Temp 98.3°F | Resp 17 | Ht 69.0 in | Wt 137.8 lb

## 2019-08-24 DIAGNOSIS — Z170 Estrogen receptor positive status [ER+]: Secondary | ICD-10-CM

## 2019-08-24 DIAGNOSIS — Z95828 Presence of other vascular implants and grafts: Secondary | ICD-10-CM

## 2019-08-24 DIAGNOSIS — J45909 Unspecified asthma, uncomplicated: Secondary | ICD-10-CM | POA: Insufficient documentation

## 2019-08-24 DIAGNOSIS — Z79811 Long term (current) use of aromatase inhibitors: Secondary | ICD-10-CM | POA: Insufficient documentation

## 2019-08-24 DIAGNOSIS — M85852 Other specified disorders of bone density and structure, left thigh: Secondary | ICD-10-CM | POA: Diagnosis not present

## 2019-08-24 DIAGNOSIS — D61810 Antineoplastic chemotherapy induced pancytopenia: Secondary | ICD-10-CM | POA: Insufficient documentation

## 2019-08-24 DIAGNOSIS — C50811 Malignant neoplasm of overlapping sites of right female breast: Secondary | ICD-10-CM

## 2019-08-24 DIAGNOSIS — Z5111 Encounter for antineoplastic chemotherapy: Secondary | ICD-10-CM | POA: Diagnosis present

## 2019-08-24 DIAGNOSIS — Z5112 Encounter for antineoplastic immunotherapy: Secondary | ICD-10-CM | POA: Insufficient documentation

## 2019-08-24 DIAGNOSIS — K5900 Constipation, unspecified: Secondary | ICD-10-CM | POA: Insufficient documentation

## 2019-08-24 LAB — CBC WITH DIFFERENTIAL (CANCER CENTER ONLY)
Abs Immature Granulocytes: 0.01 10*3/uL (ref 0.00–0.07)
Basophils Absolute: 0.1 10*3/uL (ref 0.0–0.1)
Basophils Relative: 2 %
Eosinophils Absolute: 0.1 10*3/uL (ref 0.0–0.5)
Eosinophils Relative: 3 %
HCT: 35.1 % — ABNORMAL LOW (ref 36.0–46.0)
Hemoglobin: 11.1 g/dL — ABNORMAL LOW (ref 12.0–15.0)
Immature Granulocytes: 0 %
Lymphocytes Relative: 25 %
Lymphs Abs: 0.8 10*3/uL (ref 0.7–4.0)
MCH: 28.9 pg (ref 26.0–34.0)
MCHC: 31.6 g/dL (ref 30.0–36.0)
MCV: 91.4 fL (ref 80.0–100.0)
Monocytes Absolute: 0.4 10*3/uL (ref 0.1–1.0)
Monocytes Relative: 12 %
Neutro Abs: 1.7 10*3/uL (ref 1.7–7.7)
Neutrophils Relative %: 58 %
Platelet Count: 111 10*3/uL — ABNORMAL LOW (ref 150–400)
RBC: 3.84 MIL/uL — ABNORMAL LOW (ref 3.87–5.11)
RDW: 15.6 % — ABNORMAL HIGH (ref 11.5–15.5)
WBC Count: 3 10*3/uL — ABNORMAL LOW (ref 4.0–10.5)
nRBC: 0 % (ref 0.0–0.2)

## 2019-08-24 LAB — CMP (CANCER CENTER ONLY)
ALT: 25 U/L (ref 0–44)
AST: 18 U/L (ref 15–41)
Albumin: 4.2 g/dL (ref 3.5–5.0)
Alkaline Phosphatase: 114 U/L (ref 38–126)
Anion gap: 11 (ref 5–15)
BUN: 14 mg/dL (ref 6–20)
CO2: 27 mmol/L (ref 22–32)
Calcium: 9.6 mg/dL (ref 8.9–10.3)
Chloride: 106 mmol/L (ref 98–111)
Creatinine: 0.77 mg/dL (ref 0.44–1.00)
GFR, Est AFR Am: >60 mL/min (ref >60)
GFR, Estimated: >60 mL/min (ref >60)
Glucose, Bld: 93 mg/dL (ref 70–99)
Potassium: 4.1 mmol/L (ref 3.5–5.1)
Sodium: 144 mmol/L (ref 135–145)
Total Bilirubin: 0.4 mg/dL (ref 0.3–1.2)
Total Protein: 7.6 g/dL (ref 6.5–8.1)

## 2019-08-24 MED ORDER — DIPHENHYDRAMINE HCL 25 MG PO CAPS
50.0000 mg | ORAL_CAPSULE | Freq: Once | ORAL | Status: AC
  Administered 2019-08-24: 50 mg via ORAL

## 2019-08-24 MED ORDER — DIPHENHYDRAMINE HCL 25 MG PO CAPS
ORAL_CAPSULE | ORAL | Status: AC
  Filled 2019-08-24: qty 2

## 2019-08-24 MED ORDER — NERATINIB MALEATE 40 MG PO TABS: 240.0000 mg | ORAL_TABLET | Freq: Every day | ORAL | 0 refills | Status: DC

## 2019-08-24 MED ORDER — ACETAMINOPHEN 325 MG PO TABS
ORAL_TABLET | ORAL | Status: AC
  Filled 2019-08-24: qty 2

## 2019-08-24 MED ORDER — SODIUM CHLORIDE 0.9% FLUSH
10.0000 mL | INTRAVENOUS | Status: DC | PRN
  Administered 2019-08-24: 10 mL
  Filled 2019-08-24: qty 10

## 2019-08-24 MED ORDER — BUDESONIDE ER 9 MG PO CP24: 9.0000 mg | ORAL_CAPSULE | Freq: Every day | ORAL | 0 refills | Status: DC

## 2019-08-24 MED ORDER — NERATINIB MALEATE 40 MG PO TABS: ORAL_TABLET | ORAL | 0 refills | Status: DC

## 2019-08-24 MED ORDER — SODIUM CHLORIDE 0.9 % IV SOLN
3.6000 mg/kg | Freq: Once | INTRAVENOUS | Status: AC
  Administered 2019-08-24: 220 mg via INTRAVENOUS
  Filled 2019-08-24: qty 8

## 2019-08-24 MED ORDER — SODIUM CHLORIDE 0.9% FLUSH
10.0000 mL | INTRAVENOUS | Status: DC | PRN
  Filled 2019-08-24: qty 10

## 2019-08-24 MED ORDER — SODIUM CHLORIDE 0.9 % IV SOLN
Freq: Once | INTRAVENOUS | Status: AC
  Filled 2019-08-24: qty 250

## 2019-08-24 MED ORDER — ACETAMINOPHEN 325 MG PO TABS
650.0000 mg | ORAL_TABLET | Freq: Once | ORAL | Status: AC
  Administered 2019-08-24: 650 mg via ORAL

## 2019-08-24 MED ORDER — HEPARIN SOD (PORK) LOCK FLUSH 100 UNIT/ML IV SOLN
500.0000 [IU] | Freq: Once | INTRAVENOUS | Status: DC | PRN
  Filled 2019-08-24: qty 5

## 2019-08-24 NOTE — Patient Instructions (Signed)
[place] Discharge Instructions for Patients Receiving Chemotherapy  Today you received the following chemotherapy agents: ado-trastuzumab emtansine (Kadcyla)  To help prevent nausea and vomiting after your treatment, we encourage you to take your nausea medication as directed.   If you develop nausea and vomiting that is not controlled by your nausea medication, call the clinic.   BELOW ARE SYMPTOMS THAT SHOULD BE REPORTED IMMEDIATELY:  *FEVER GREATER THAN 100.5 F  *CHILLS WITH OR WITHOUT FEVER  NAUSEA AND VOMITING THAT IS NOT CONTROLLED WITH YOUR NAUSEA MEDICATION  *UNUSUAL SHORTNESS OF BREATH  *UNUSUAL BRUISING OR BLEEDING  TENDERNESS IN MOUTH AND THROAT WITH OR WITHOUT PRESENCE OF ULCERS  *URINARY PROBLEMS  *BOWEL PROBLEMS  UNUSUAL RASH Items with * indicate a potential emergency and should be followed up as soon as possible.  Feel free to call the clinic should you have any questions or concerns. The clinic phone number is [phone].  Please show the [place] at check-in to the Emergency Department and triage nurse.

## 2019-08-24 NOTE — Telephone Encounter (Signed)
Oral Oncology Patient Advocate Encounter  Received notification from [name] that prior authorization for Nerlynx is required.  PA submitted on CoverMyMeds Key [id number] Status is pending  Oral Oncology Clinic will continue to follow.  [place] Patient [place] Phone [phone] Fax [phone] 08/24/2019 1:11 PM

## 2019-08-24 NOTE — Telephone Encounter (Signed)
Oral Oncology Patient Advocate Encounter  Prior Authorization for Nerlynx has been approved.    PA# [id number] Effective dates: 08/24/19 through 08/23/20  Patients co-pay is $0  Oral Oncology Clinic will continue to follow.   [place] Patient [place] Phone [phone] Fax [phone] 08/24/2019 4:03 PM

## 2019-08-24 NOTE — Telephone Encounter (Signed)
Oral Oncology Pharmacist Encounter  Received new prescription for Nerlynx (neratinib) for the adjuvant treatment of breast cancer, planned duration of one year. Plan to start one month after completing her current treatment.  CMP from 08/24/19 assessed, no relevant lab abnormalities. Prescription dose and frequency assessed.   Current medication list in Epic reviewed, no DDIs with neratinib identified.  Prescription has been e-scribed to the [facility] for benefits analysis and approval.  Oral Oncology Clinic will continue to follow for insurance authorization, copayment issues, initial counseling and start date.  Darl Pikes, PharmD, BCPS, BCOP, CPP Hematology/Oncology Clinical Pharmacist Practitioner [facility] [phone]  08/24/2019 2:29 PM

## 2019-08-24 NOTE — Patient Instructions (Signed)

## 2019-08-26 ENCOUNTER — Telehealth: Payer: Self-pay | Admitting: Hematology

## 2019-08-26 NOTE — Telephone Encounter (Signed)
Scheduled appt per 5/10 los.  Spoke with pt and she is aware of her appt date and time

## 2019-08-27 NOTE — Telephone Encounter (Signed)
Oral Chemotherapy Pharmacist Encounter  Ms. Haralson plans on picking up her Nerlynx from [place] on Monday. She knows the plan is to get started sometime in June and to await the go ahead from Dr. Burr Medico.  Patient Education I spoke with patient for overview of new oral chemotherapy medication: Nerlynx (neratinib) for the adjuvant treatment of breast cancer, planned duration of one year. Plan to start one month after completing her current treatment.  Counseled patient on administration, dosing, side effects, monitoring, drug-food interactions, safe handling, storage, and disposal. Patient will take 4 tablets (160 mg total) by mouth daily for 7 days, THEN 5 tablets (200 mg total) daily for 7 days, THEN 6 tablets (240 mg total) daily for 16 days. Take with food. Increase as tolerated.  Side effects include but not limited to: diarrhea, N/V, abdominal pain.  Ms. Harrah plans on picking up her budesonide from her local [facility]. She knows this is to be started in conjunction with the neratinib.   She knows that she can also use loperamide as needed.  Reviewed with patient importance of keeping a medication schedule and plan for any missed doses.  Ms. Dangelo voiced understanding and appreciation. All questions answered. Medication handout placed in the mail.  Provided patient with [facility] phone number. Patient knows to call the office with questions or concerns. Oral Chemotherapy Navigation Clinic will continue to follow.  Darl Pikes, PharmD, BCPS, BCOP, CPP Hematology/Oncology Clinical Pharmacist Practitioner [facility] [phone]  08/27/2019 4:45 PM

## 2019-08-31 ENCOUNTER — Other Ambulatory Visit: Payer: Self-pay

## 2019-08-31 ENCOUNTER — Inpatient Hospital Stay: Payer: BC Managed Care – PPO

## 2019-08-31 VITALS — BP 108/72 | HR 62 | Temp 98.2°F | Resp 18

## 2019-08-31 DIAGNOSIS — Z95828 Presence of other vascular implants and grafts: Secondary | ICD-10-CM

## 2019-08-31 DIAGNOSIS — Z5112 Encounter for antineoplastic immunotherapy: Secondary | ICD-10-CM | POA: Diagnosis not present

## 2019-08-31 MED ORDER — GOSERELIN ACETATE 3.6 MG ~~LOC~~ IMPL
DRUG_IMPLANT | SUBCUTANEOUS | Status: AC
  Filled 2019-08-31: qty 3.6

## 2019-08-31 MED ORDER — GOSERELIN ACETATE 3.6 MG ~~LOC~~ IMPL
3.6000 mg | DRUG_IMPLANT | Freq: Once | SUBCUTANEOUS | Status: AC
  Administered 2019-08-31: 3.6 mg via SUBCUTANEOUS

## 2019-08-31 MED FILL — NERLYNX 40 MG TABLET: 40 | 30 days supply | Qty: 159 | Fill #0

## 2019-08-31 NOTE — Patient Instructions (Signed)

## 2019-09-01 ENCOUNTER — Encounter: Payer: Self-pay | Admitting: Hematology

## 2019-09-01 ENCOUNTER — Other Ambulatory Visit: Payer: Self-pay

## 2019-09-01 DIAGNOSIS — C50811 Malignant neoplasm of overlapping sites of right female breast: Secondary | ICD-10-CM

## 2019-09-01 DIAGNOSIS — Z170 Estrogen receptor positive status [ER+]: Secondary | ICD-10-CM

## 2019-09-01 MED ORDER — SERTRALINE HCL 100 MG PO TABS: 100.0000 mg | ORAL_TABLET | Freq: Every day | ORAL | 2 refills | Status: DC

## 2019-09-03 ENCOUNTER — Telehealth: Payer: Self-pay

## 2019-09-03 NOTE — Telephone Encounter (Signed)
Per Cira Rue NP called patient [phone]) and updated her that her medication Budesonide that was recently denied has been approved now by pharmacy. Called [facility] on [address] [phone]) and updated them on approval as well. Prescription was confirmed by pharmacy on 08/24/19.

## 2019-09-12 ENCOUNTER — Encounter: Payer: Self-pay | Admitting: Hematology

## 2019-09-28 ENCOUNTER — Inpatient Hospital Stay: Payer: BC Managed Care – PPO

## 2019-09-28 ENCOUNTER — Inpatient Hospital Stay: Payer: BC Managed Care – PPO | Attending: Hematology

## 2019-09-28 ENCOUNTER — Other Ambulatory Visit: Payer: Self-pay

## 2019-09-28 ENCOUNTER — Inpatient Hospital Stay (HOSPITAL_BASED_OUTPATIENT_CLINIC_OR_DEPARTMENT_OTHER): Payer: BC Managed Care – PPO | Admitting: Hematology

## 2019-09-28 ENCOUNTER — Encounter: Payer: Self-pay | Admitting: Hematology

## 2019-09-28 VITALS — BP 113/67 | HR 51 | Temp 98.1°F | Resp 20 | Ht 69.0 in | Wt 139.6 lb

## 2019-09-28 DIAGNOSIS — D61818 Other pancytopenia: Secondary | ICD-10-CM | POA: Insufficient documentation

## 2019-09-28 DIAGNOSIS — Z5111 Encounter for antineoplastic chemotherapy: Secondary | ICD-10-CM | POA: Insufficient documentation

## 2019-09-28 DIAGNOSIS — Z79811 Long term (current) use of aromatase inhibitors: Secondary | ICD-10-CM | POA: Insufficient documentation

## 2019-09-28 DIAGNOSIS — M85852 Other specified disorders of bone density and structure, left thigh: Secondary | ICD-10-CM | POA: Insufficient documentation

## 2019-09-28 DIAGNOSIS — Z170 Estrogen receptor positive status [ER+]: Secondary | ICD-10-CM | POA: Insufficient documentation

## 2019-09-28 DIAGNOSIS — J45909 Unspecified asthma, uncomplicated: Secondary | ICD-10-CM | POA: Diagnosis not present

## 2019-09-28 DIAGNOSIS — C50811 Malignant neoplasm of overlapping sites of right female breast: Secondary | ICD-10-CM | POA: Insufficient documentation

## 2019-09-28 DIAGNOSIS — Z95828 Presence of other vascular implants and grafts: Secondary | ICD-10-CM

## 2019-09-28 LAB — CMP (CANCER CENTER ONLY)
ALT: 30 U/L (ref 0–44)
AST: 16 U/L (ref 15–41)
Albumin: 4.1 g/dL (ref 3.5–5.0)
Alkaline Phosphatase: 84 U/L (ref 38–126)
Anion gap: 9 (ref 5–15)
BUN: 11 mg/dL (ref 6–20)
CO2: 27 mmol/L (ref 22–32)
Calcium: 9.4 mg/dL (ref 8.9–10.3)
Chloride: 107 mmol/L (ref 98–111)
Creatinine: 0.78 mg/dL (ref 0.44–1.00)
GFR, Est AFR Am: >60 mL/min (ref >60)
GFR, Estimated: >60 mL/min (ref >60)
Glucose, Bld: 118 mg/dL — ABNORMAL HIGH (ref 70–99)
Potassium: 4.1 mmol/L (ref 3.5–5.1)
Sodium: 143 mmol/L (ref 135–145)
Total Bilirubin: 0.5 mg/dL (ref 0.3–1.2)
Total Protein: 7.3 g/dL (ref 6.5–8.1)

## 2019-09-28 LAB — CBC WITH DIFFERENTIAL (CANCER CENTER ONLY)
Abs Immature Granulocytes: 0.01 10*3/uL (ref 0.00–0.07)
Basophils Absolute: 0 10*3/uL (ref 0.0–0.1)
Basophils Relative: 0 %
Eosinophils Absolute: 0 10*3/uL (ref 0.0–0.5)
Eosinophils Relative: 0 %
HCT: 31.5 % — ABNORMAL LOW (ref 36.0–46.0)
Hemoglobin: 10.1 g/dL — ABNORMAL LOW (ref 12.0–15.0)
Immature Granulocytes: 0 %
Lymphocytes Relative: 17 %
Lymphs Abs: 0.5 10*3/uL — ABNORMAL LOW (ref 0.7–4.0)
MCH: 29.7 pg (ref 26.0–34.0)
MCHC: 32.1 g/dL (ref 30.0–36.0)
MCV: 92.6 fL (ref 80.0–100.0)
Monocytes Absolute: 0.1 10*3/uL (ref 0.1–1.0)
Monocytes Relative: 4 %
Neutro Abs: 2 10*3/uL (ref 1.7–7.7)
Neutrophils Relative %: 79 %
Platelet Count: 68 10*3/uL — ABNORMAL LOW (ref 150–400)
RBC: 3.4 MIL/uL — ABNORMAL LOW (ref 3.87–5.11)
RDW: 14.9 % (ref 11.5–15.5)
WBC Count: 2.6 10*3/uL — ABNORMAL LOW (ref 4.0–10.5)
nRBC: 0 % (ref 0.0–0.2)

## 2019-09-28 MED ORDER — GOSERELIN ACETATE 3.6 MG ~~LOC~~ IMPL
DRUG_IMPLANT | SUBCUTANEOUS | Status: AC
  Filled 2019-09-28: qty 3.6

## 2019-09-28 MED ORDER — GOSERELIN ACETATE 3.6 MG ~~LOC~~ IMPL
3.6000 mg | DRUG_IMPLANT | Freq: Once | SUBCUTANEOUS | Status: AC
  Administered 2019-09-28: 3.6 mg via SUBCUTANEOUS

## 2019-09-28 NOTE — Progress Notes (Signed)
[place]   [phone]) [phone] [phone]) [phone]   Clinic Follow up Note   Patient Care Team: [name], MD as PCP - General (Family Medicine) [name], MD as Consulting Physician (General Surgery) [name], MD as Consulting Physician (Hematology) [name], MD as Consulting Physician (Radiation Oncology) [name], MD as Consulting Physician (Obstetrics and Gynecology) [name], RN as Oncology Nurse Navigator [name], RN as Oncology Nurse Navigator [name], NP as Nurse Practitioner (Nurse Practitioner)  Date of Service:  09/28/2019  CHIEF COMPLAINT:  F/u multifocal right breast cancer  SUMMARY OF ONCOLOGIC HISTORY: Oncology History Overview Note  Cancer Staging Cancer of overlapping sites of right female breast [facility]) Staging form: Breast, AJCC 8th Edition - Clinical stage from 05/28/2018: Stage IB (cT2, cN1, cM0, G3, ER+, PR+, HER2+) - Signed by [name], MD on 05/28/2018  Malignant neoplasm of lower-outer quadrant of right breast of female, estrogen receptor positive ([place]) Staging form: Breast, AJCC 8th Edition - Clinical stage from 05/28/2018: Stage IIB (cT2, cN1, cM0, G3, ER+, PR+, HER2-) - Unsigned     Cancer of overlapping sites of right female breast ([place])  05/14/2018 Mammogram   Right breast mammogram 05/14/18  IMPRESSION  There is a 1.0 x 0.5 x 1.2 cm lobular hypoechoic mass right breast 7 o'clock position 3 cm from nipple, a 0.8 x 0.6 x 1.2 cm lobular hypoechoic mass right breast 7 o'clock position 2 cm from the nipple, and a 0.9 x 0.5 x 0.8 cm lobular hypoechoic mass right breast 5 o'clock position 3 cm from nipple.  There is a 3.8 x 1.0 x 1.3 cm lobular hypoechoic mass right breast 5 o'clock position 2 cm from nipple.  Multiple thickened right axillary lymph nodes (approximately 15). Measuring up to 1.0 cm in thickness. Adenopathy corresponds with axillary palpable abnormality.  Indeterminate  round and punctate calcifications upper-outer quadrant right breast.Indeterminate round and punctate calcifications upper-outer quadrant right breast.   05/21/2018 Initial Biopsy   Diagnosis 05/21/18  1. Breast, right, needle core biopsy, axilla, 5 o'clock, ribbon clip - INVASIVE MAMMARY CARCINOMA. - MAMMARY CARCINOMA IN SITU. 2. Breast, right, needle core biopsy, 7 o'clock, coil clip - INVASIVE MAMMARY CARCINOMA. - MAMMARY CARCINOMA IN SITU. 3. Lymph node, needle/core biopsy, right axillary LN, hydromark - METASTATIC MAMMARY CARCINOMA.    05/21/2018 Receptors her2   1. PROGNOSTIC INDICATORS Results: The tumor cells are EQUIVOCAL for Her2 (2+);  HER2 **NEGATIVE** by FISH   Estrogen Receptor: 80%, POSITIVE, MODERATE STAINING INTENSITY Progesterone Receptor: 5%, POSITIVE, MODERATE-WEAK STAINING INTENSITY Proliferation Marker Ki67: 10%  3. PROGNOSTIC INDICATORS Results: The tumor cells are POSITIVE for Her2 (3+). Estrogen Receptor: 90%, POSITIVE, STRONG STAINING INTENSITY Progesterone Receptor: 50%, POSITIVE, MODERATE STAINING INTENSITY Proliferation Marker Ki67: 5%   05/26/2018 Initial Diagnosis   Malignant neoplasm of lower-inner quadrant of right breast of female, estrogen receptor positive ([place])   05/27/2018 Mammogram   Left breast mammogram 05/27/18  IMPRESSION: No mammographic evidence of LEFT breast malignancy.   05/28/2018 Cancer Staging   Staging form: Breast, AJCC 8th Edition - Clinical stage from 05/28/2018: Stage IB (cT2, cN1, cM0, G3, ER+, PR+, HER2+) - Signed by [name], MD on 05/28/2018   06/03/2018 Imaging   MR BREAST BILATERAL W WO CONTRAST INC CAD  IMPRESSION: 1. Large enhancing masses and non mass enhancement identified throughout the majority of the right breast. Additionally, there are smaller masses identified within the upper inner and lower inner right breast. Overall findings  are concerning for extensive malignancy throughout all 4 quadrants of the right  breast. 2. Extensive bulky lymphadenopathy identified within the right axilla. The matted appearance makes counting the number of abnormal lymph nodes difficult as they are immediately approximated to each other. 3. At least one abnormal appearing right internal mammary lymph node. 4. Nonenhancing lesion within the sternum with associated sternal marrow heterogeneity, raising the possibility of osseous metastatic disease within the sternum. 5. Two enhancing masses within the left breast as described above.     06/05/2018 PET scan   NM PET Image Initial (PI) Skull Base To Thigh  IMPRESSION: 1. Multiple borderline enlarged right axillary lymph nodes identified exhibiting mild to moderate increased uptake. Can not rule out nodal metastasis. Correlation with biopsy recommended. 2. Asymmetric increased uptake within the right breast corresponding to MRI abnormality. 3. Subcentimeter right retropectoral and right internal mammary lymph nodes are again identified and exhibit nonspecific, low level FDG uptake. 4. Noncalcified solid nodule within the lateral right lower lobe measures 9 mm without significant radiotracer uptake. 5. No hypermetabolic osseous lesions identified.     06/09/2018 - 11/03/2018 Chemotherapy   Neoadjuvant chemotherapy TCHP, 06/09/2018-09/22/18. Followed by Maintenance Herceptin/Perjeta q3weeks starting 10/14/18 to continue 1 year treatment. Stopped Herceptin/Perjeta on 11/03/18 due to multiple Positive LN of pathology.    06/10/2018 Pathology Results   Surgical pathology  Diagnosis 1. Breast, left, needle core biopsy, LOQ, barbell - MILD FIBROCYSTIC CHANGES. 2. Breast, left, needle core biopsy, UIQ cylinder - MILD FIBROCYSTIC CHANGES.    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 There were no  deleterious mutations in AIP, ALK, APC, ATM, AXIN2,BAP1,  BARD1, BLM, BMPR1A, BRCA1, BRCA2, BRIP1, CASR, CDC73, CDH1, CDK4, CDKN1B, CDKN1C, CDKN2A (p14ARF), CDKN2A (p16INK4a), CEBPA, CHEK2, CTNNA1, DICER1, DIS3L2, EGFR (c.2369C>T, p.Thr790Met variant only), EPCAM (Deletion/duplication testing only), FH, FLCN, GATA2, GPC3, GREM1 (Promoter region deletion/duplication testing only), HOXB13 (c.251G>A, p.Gly84Glu), HRAS, KIT, MAX, MEN1, MET, MITF (c.952G>A, p.Glu318Lys variant only), MLH1, MSH2, MSH3, MSH6, MUTYH, NBN, NF1, NF2, NTHL1, PDGFRA, PHOX2B, PMS2, POLD1, POLE, POT1, PRKAR1A, PTCH1, PTEN, RAD50, RAD51C, RAD51D, RB1, RECQL4, RET, RUNX1, SDHAF2, SDHA (sequence changes only), SDHB, SDHC, SDHD, SMAD4, SMARCA4, SMARCB1, SMARCE1, STK11, SUFU, TERC, TERT, TMEM127, TP53, TSC1, TSC2, VHL, WRN and WT1.  .   09/24/2018 Breast MRI   Breast MRI from [facility] on 09/24/18 Right breast: Signal void inferior right breast, posterior depth from biopsy marker clip. Interval decreased size of the right breast, and now symmetric to the left, compared to prior MRI. Resolution of abnormal nonmass enhancement without evidence of residual enhancing mass. No evidence of axillary or internal mammary lymphadenopathy is seen, markedly improved from prior. There is no abnormal skin, nipple, or pectoralis muscle enhancement.  Left breast: There are no suspicious enhancing masses or areas of non-mass enhancement. No evidence of axillary or internal mammary lymphadenopathy is seen. There is no abnormal skin, nipple, or pectoralis muscle enhancement.   10/09/2018 Imaging   CT chest W Contrast 10/09/18  IMPRESSION: 1. Stable 10 mm right lower lobe pulmonary nodule since 06/05/2018. Nodule is not substantially hypermetabolic on the PET-CT. Continued attention on follow-up recommended. 2. No findings to suggest metastatic disease in the thorax.   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 surrounding tissue - Biopsy clips identified - Margin status (see extended anterior margin below)  Invasive carcinoma: Positive anterior-inferior margin  Ductal carcinoma in situ: Positive anterior-inferior margin - Ancillary studies previously reported on [id number](5:00, ribbon clip) Estrogen receptor: Positive (80%) Progesterone receptor: Positive (10%) HER2 IHC: Equivocal (2+) HER2 FISH: Negative HER2/CEP17 ratio: 1.35 Mean HER2 copy number: 1.55 - Ancillary studies previously reported on [id number] (right axillary lymph node) Estrogen receptor: Positive (90%) Progesterone receptor: Positive (20%) HER2 IHC: Positive (3+) - Residual Cancer Burden  Tumor bed size: 41 mm x 20 mm Overall tumor cellularity: 10% Percentage in situ: 60% Number of involved lymph nodes: 7 Diameter of largest metastasis: 6 mm Residual Cancer Burden Score: 3.108 Residual Cancer Burden Class: RCB-II - Other findings: Atypical lobular hyperplasia and columnar cell change with microcalcifications; fibroadenomas with microcalcifications  B: Right axilla, targeted lymph node excision - Six of seven lymph nodes positive for carcinoma (6/7) - Size of largest tumor deposit: 6 mm - Biopsy site identified - Treatment effect: Present - Extracapsular extension: Absent  C: Right axilla, lymph node dissection - One of seven lymph nodes positive for carcinoma, macrometastasis, 6 mm (1/7) - Treatment effect: Absent - Extracapsular extension: Absent  D: Right breast, nipple core biopsy - Negative for carcinoma  E: Breast, right, anterior margin, excision - Microscopic foci of ductal carcinoma in situ - Margin: Negative, <1 mm    11/05/2018  Cancer Staging   Staging form: Breast, AJCC 8th Edition - Pathologic stage from 11/05/2018: No Stage Recommended (ypT1c, pN2a, cM0, G2, ER+, PR+, HER2-) - Signed by [name], MD on 11/23/2018   11/24/2018 - 08/24/2019 Chemotherapy   Due to positive LN on surgical pathology her maintance Herceptin/Perjeta was changed to [place] for about 14 cycles on 11/24/18. Completed on 08/24/19   12/24/2018 - 02/03/2019 Radiation Therapy   adjuvant [facility] on 12/24/18 and completed on 02/03/19.    01/23/2019 Imaging   CT Chest wo  IMPRESSION: 1. Stable right lower lobe pulmonary nodule. 2. No signs of new or suspicious finding since study of 10/09/2018, now status post right mastectomy, axillary dissection and breast reconstruction.   02/16/2019 -  Anti-estrogen oral therapy   -Monthly Zoladex injection started 12/19/18 -Letrozole 2.5 mg started 02/16/19    04/20/2019 Survivorship   SCP delivered by Cira Rue, NP    09/22/2019 -  Chemotherapy   Nerlynx starting at 4 tablets (160 mg total) by mouth daily for 7 days, THEN 5 tablets (200 mg total) daily for 7 days, THEN 6 tablets (240 mg total) daily for 16 days, starting on 09/22/19.       CURRENT THERAPY:  -Monthly Zoladex injection started 12/19/18 -Letrozole 2.5 mg started 02/16/19 -Nerlynx starting at 4 tablets (160 mg total) by mouth daily for 7 days, THEN 5 tablets (200 mg total) daily for 7 days, THEN 6 tablets (240 mg total) daily for 16 days, starting on 09/22/19.   INTERVAL HISTORY:  Kim [name] is here for a follow up. She presents to the clinic alone. She notes she is doing well. She started Nerlynx on 09/22/19 and currently on '160mg'$  titrating dose. She denies any diarrhea from Nerlynx. She notes she still has constipation. She notes she also has budesonide. She has not take Miralax  or stool softener recently. She notes her energy and eating is adequate. She notes she is sleeping adequately on '100mg'$  Zoloft.      REVIEW OF SYSTEMS:   Constitutional: Denies fevers, chills or abnormal weight loss Eyes: Denies blurriness of vision Ears, nose, mouth, throat, and face: Denies mucositis or sore throat Respiratory: Denies cough, dyspnea or wheezes Cardiovascular: Denies palpitation, chest discomfort or lower extremity swelling Gastrointestinal:  Denies nausea, heartburn or change in bowel habits (+) Constipation.  Skin: Denies abnormal skin rashes Lymphatics: Denies new lymphadenopathy or easy bruising Neurological:Denies numbness, tingling or new weaknesses Behavioral/Psych: Mood is stable, no new changes  All other systems were reviewed with the patient and are negative.  MEDICAL HISTORY:  Past Medical History:  Diagnosis Date  . Acid reflux   . Acute bronchitis   . Anemia   . Asthma   . Family history of brain cancer   . Family history of breast cancer   . Family history of prostate cancer   . Migraine     SURGICAL HISTORY: Past Surgical History:  Procedure Laterality Date  . BREAST BIOPSY Right 05/23/2018   x3, malignant  . DILATION AND CURETTAGE OF UTERUS     2006 x1, 2009 x2  . LAPAROTOMY  2008  . PORTACATH PLACEMENT Left 06/06/2018   Procedure: INSERTION PORT-A-CATH POSSIBLE ULTRASOUND;  Surgeon: [name], MD;  Location: [facility];  Service: General;  Laterality: Left;    I have reviewed the social history and family history with the patient and they are unchanged from previous note.  ALLERGIES:  has No Known Allergies.  MEDICATIONS:  Current Outpatient Medications  Medication Sig Dispense Refill  . albuterol (PROVENTIL HFA;VENTOLIN HFA) 108 (90 BASE) MCG/ACT inhaler Inhale 2 puffs into the lungs every 6 (six) hours as needed for wheezing or shortness of breath.    . ALPRAZolam (XANAX) 0.5 MG tablet Take 0.25-0.5 mg by mouth at bedtime as needed for anxiety.     Haze Justin Probiotic (BIOGAIA PROBIOTIC) LIQD Take by mouth daily at 8 pm.    .  Budesonide ER 9 MG CP24 Take 9 mg by mouth daily. 30 capsule 0  . Cholecalciferol (VITAMIN D) 50 MCG (2000 UT) CAPS Take 2,000 Units by mouth daily.     . diphenhydrAMINE (BENADRYL) 25 mg capsule Take 25 mg by mouth at bedtime as needed for sleep.    Marland Kitchen ibuprofen (ADVIL) 200 MG tablet Take 400 mg by mouth every 6 (six) hours as needed for headache or moderate pain.    Marland Kitchen letrozole (FEMARA) 2.5 MG tablet TAKE 1 TABLET BY MOUTH EVERY DAY 90 tablet 1  . Melatonin 3 MG CAPS Take 3 mg by mouth at bedtime.    . Polyethylene Glycol 3350 (MIRALAX PO) Take 17 g by mouth daily as needed.     . propranolol (INDERAL) 10 MG tablet Take 5 mg by mouth daily as needed (for shaking hands).     . sertraline (ZOLOFT) 100 MG tablet Take 1 tablet (100 mg total) by mouth daily. 90 tablet 2  . zolpidem (AMBIEN) 5 MG tablet Take 5 mg by mouth at bedtime as needed.     No current facility-administered medications for this visit.   Facility-Administered Medications Ordered in Other Visits  Medication Dose Route Frequency Provider Last Rate Last Admin  . goserelin (ZOLADEX) injection 3.6 mg  3.6 mg Subcutaneous Once [name], MD        PHYSICAL EXAMINATION: ECOG PERFORMANCE STATUS:  0 - Asymptomatic  Vitals:   09/28/19 1306  BP: 113/67  Pulse: (!) 51  Resp: 20  Temp: 98.1 F (36.7 C)  SpO2: 100%   Filed Weights   09/28/19 1306  Weight: 139 lb 9.6 oz (63.3 kg)    Due to COVID19 we will limit examination to appearance. Patient had no complaints.  GENERAL:alert, no distress and comfortable SKIN: skin color normal, no rashes or significant lesions EYES: normal, Conjunctiva are pink and non-injected, sclera clear  NEURO: alert & oriented x 3 with fluent speech   LABORATORY DATA:  I have reviewed the data as listed CBC Latest Ref Rng & Units 09/28/2019 08/24/2019 08/03/2019  WBC 4.0 - 10.5 K/uL 2.6(L) 3.0(L) 1.9(L)  Hemoglobin 12.0 - 15.0 g/dL 10.1(L) 11.1(L) 10.4(L)  Hematocrit 36 - 46 % 31.5(L) 35.1(L)  32.5(L)  Platelets 150 - 400 K/uL 68(L) 111(L) 78(L)     CMP Latest Ref Rng & Units 08/24/2019 08/03/2019 07/13/2019  Glucose 70 - 99 mg/dL 93 74 89  BUN 6 - 20 mg/dL _0 Creatinine 0.44 - 1.00 mg/dL 0.77 0.72 0.79  Sodium 135 - 145 mmol/L 144 141 142  Potassium 3.5 - 5.1 mmol/L 4.1 4.1 4.2  Chloride 98 - 111 mmol/L 106 108 107  CO2 22 - 32 mmol/L _1 Calcium 8.9 - 10.3 mg/dL 9.6 9.0 9.4  Total Protein 6.5 - 8.1 g/dL 7.6 7.0 7.4  Total Bilirubin 0.3 - 1.2 mg/dL 0.4 0.4 0.4  Alkaline Phos 38 - 126 U/L 114 95 108  AST 15 - 41 U/L _2 ALT 0 - 44 U/L _3 RADIOGRAPHIC STUDIES: I have personally reviewed the radiological images as listed and agreed with the findings in the report. No results found.   ASSESSMENT & PLAN:  Kim [name] is a 44 y.o. 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 -She plans to have reconstructive surgery and prophylactic left mastectomy in the Summer 2021 at [facility]. Her surgeon plans to obtain breast MRI prior to that. I recommend she have PAC removed with surgery.   -Given her ER/PR positive disease I started her on antiestrogen therapy with monthly Zoladex injection in 12/2018 and on Letrozole in 02/2019.Tolerating well with manageable hot flashes and mood swings, which has improved with Zoloft.  -Due to positive LNs completed 14 cycles of adjuvant Kadcyla on 08/24/19.  -To further reduce her risk of recurrence I discussed anti-HER2 treatment with Nerlynx which she started 09/22/19. She will take with Budesonide. She will start by titrating at 4 tablets (160 mg  total) by mouth daily for 7 days, THEN 5 tablets (200 mg total) daily for 7 days, THEN 6 tablets (240 mg total) daily for 16 days.  -She has tolerated the start of Nerlynx at 110m well. She denies diarrhea and still has constipation. She is not on Laxative or stool softener. I discussed given low dose diarrhea can present on higher dose. She can use 1 tab of Colase at a time for now.  -Labs reviewed, WBC 2.6, Hg 10.1, plt 68K. I discussed her thrombocytopenia is likely still from [place] she has no bleeding, will monitor closely. She may proceed with BSO  later this year  -Will proceed with Zoladex injection today and continue every 4 weeks -Continue Letrozole daily and titrating up Nerlynx.  -I reviewed breast cancer surveillance with her, and clinical symptoms to watch at home. We discussed that we do not routinely do whole-body scan, but will do if there is any clinical suspicion for cancer recurrence. 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 She has discovered a stronger maternal family history of prostate and breast cancerin her1st,2nd and 3rd degree relatives, Including her mother who was recently diagnosed. -We discussed the risk of future breast cancer from PALB2m61mtion. Given her familyhistory, she plans to doprophylactic left mastectomy and [facility]-While there is an increased risk for other cancers, there are no current guidelines for screening for these cancers, and at this time NCCN does not suggest a discussion of risk reducing salpingo-oophorectomy. -She has talked to GYN about her BSOand Hysterectomy in late 2021.   3. Asthma  -mild, continuealbuterol inhaler as needed -Ipreviouslydiscussed she is at high risk for infection.If she develops fever or productive cough with phlegm she can contact our  clinic. 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 -She has received both her COVID19 vaccines. I discussed she can participate in boost shot. I encouraged her to continue COVID19 precautions.   5. 9mm10mL lung nodule  -Her initial2/20/20PET showed a 9 mm solid rounded nodule within the lateral right lower lobe, not hypermetabolic -Her attempted lung biopsy was unsuccessful. Will monitor -Her 07/10/19 CT chest showed stable lung nodule, no new findings. Will continue to monitor for 2 years, next scan 6 months out from last scan.   6.Insomnia, Stress/Anxiety -Shehas been having insomnia issue since cancer diagnosis, shenotes benadryl has not worked and she has been on melatonin for years.  -She currently uses half to whole Ambien or half xanax to get her to sleep. -She does have mood swings and hot flashes from Letrozole. She feels she is managing her mood and any anxiety/depression well with exercise, coping tools and she has a good support system. But feels at night or napping her stress and mood get to her.  -Tohelp her mood, sleep and hot flashes, I started her on Zoloft (03/30/19).She is currently on '100mg'$  and sleep and mood has been doing well.   7. Osteopenia  -I reviewed AI such as letrozole can weaken her bone  -Her baseline DEXA from 07/21/19 shows osteopenia with lowest T-score -1.3 at left femur neck. Will monitor on AI with repeat scan every 2 years.  -She will continue Calcium and Vit D along with weight bearing exercise.  -I discussed if worsened bone density I may recommend Bisphosphonate treatment with Zometa infusion every 6 months for 2 years to strengthen her bones. I discussed this can also reduce her risk of bone metastasis.   2.  Pancytopenia -Likely related to her Kadcyla treatment, positive schedule  usually she has worsening thrombocytopenia after her last treatment a month ago -Continue follow-up  Plan -Proceed with Zoladex injection today and continue -Continue Nerlynx, she will increase to 5 tablets (200 mg total) daily tomorrow for 7 days, THEN 6 tablets (240 mg total) daily in a week  -Continue letrozole  -lab and F/u in 2 weeks     No problem-specific Assessment & Plan notes found for this encounter.  No orders of the defined types were placed in this encounter.  All questions were answered. The patient knows to call the clinic with any problems, questions or concerns. No barriers to learning was detected. The total time spent in the appointment was 30 minutes.     [name], MD 09/28/2019   I, Joslyn Devon, am acting as scribe for [name], MD.   I have reviewed the above documentation for accuracy and completeness, and I agree with the above.

## 2019-09-29 ENCOUNTER — Telehealth: Payer: Self-pay | Admitting: Hematology

## 2019-09-29 NOTE — Telephone Encounter (Signed)
Scheduled appt per 6/14 los.  Spoke with pt and she is aware of her appt date and time.

## 2019-10-01 NOTE — Progress Notes (Signed)
Kim [name]   [phone] [phone]   Clinic Follow up Note   Patient Care Team: [name], MD as PCP - General (Family Medicine) [name], MD as Consulting Physician (General Surgery) [name], MD as Consulting Physician (Hematology) [name], MD as Consulting Physician (Radiation Oncology) [name], MD as Consulting Physician (Obstetrics and Gynecology) [name], RN as Oncology Nurse Navigator [name], RN as Oncology Nurse Navigator [name], NP as Nurse Practitioner (Nurse Practitioner)  Date of Service:  10/13/2019  CHIEF COMPLAINT: F/u multifocal right breast cancer, skin rash   SUMMARY OF ONCOLOGIC HISTORY: Oncology History Overview Note  Cancer Staging Cancer of overlapping sites of right female breast [facility]) Staging form: Breast, AJCC 8th Edition - Clinical stage from 05/28/2018: Stage IB (cT2, cN1, cM0, G3, ER+, PR+, HER2+) - Signed by [name], MD on 05/28/2018  Malignant neoplasm of lower-outer quadrant of right breast of female, estrogen receptor positive ([place]) Staging form: Breast, AJCC 8th Edition - Clinical stage from 05/28/2018: Stage IIB (cT2, cN1, cM0, G3, ER+, PR+, HER2-) - Unsigned     Cancer of overlapping sites of right female breast ([place])  05/14/2018 Mammogram   Right breast mammogram 05/14/18  IMPRESSION  There is a 1.0 x 0.5 x 1.2 cm lobular hypoechoic mass right breast 7 o'clock position 3 cm from nipple, a 0.8 x 0.6 x 1.2 cm lobular hypoechoic mass right breast 7 o'clock position 2 cm from the nipple, and a 0.9 x 0.5 x 0.8 cm lobular hypoechoic mass right breast 5 o'clock position 3 cm from nipple.  There is a 3.8 x 1.0 x 1.3 cm lobular hypoechoic mass right breast 5 o'clock position 2 cm from nipple.  Multiple thickened right axillary lymph nodes (approximately 15). Measuring up to 1.0 cm in thickness. Adenopathy corresponds with axillary palpable abnormality.   Indeterminate round and punctate calcifications upper-outer quadrant right breast.Indeterminate round and punctate calcifications upper-outer quadrant right breast.   05/21/2018 Initial Biopsy   Diagnosis 05/21/18  1. Breast, right, needle core biopsy, axilla, 5 o'clock, ribbon clip - INVASIVE MAMMARY CARCINOMA. - MAMMARY CARCINOMA IN SITU. 2. Breast, right, needle core biopsy, 7 o'clock, coil clip - INVASIVE MAMMARY CARCINOMA. - MAMMARY CARCINOMA IN SITU. 3. Lymph node, needle/core biopsy, right axillary LN, hydromark - METASTATIC MAMMARY CARCINOMA.    05/21/2018 Receptors her2   1. PROGNOSTIC INDICATORS Results: The tumor cells are EQUIVOCAL for Her2 (2+);  HER2 **NEGATIVE** by FISH   Estrogen Receptor: 80%, POSITIVE, MODERATE STAINING INTENSITY Progesterone Receptor: 5%, POSITIVE, MODERATE-WEAK STAINING INTENSITY Proliferation Marker Ki67: 10%  3. PROGNOSTIC INDICATORS Results: The tumor cells are POSITIVE for Her2 (3+). Estrogen Receptor: 90%, POSITIVE, STRONG STAINING INTENSITY Progesterone Receptor: 50%, POSITIVE, MODERATE STAINING INTENSITY Proliferation Marker Ki67: 5%   05/26/2018 Initial Diagnosis   Malignant neoplasm of lower-inner quadrant of right breast of female, estrogen receptor positive ([place])   05/27/2018 Mammogram   Left breast mammogram 05/27/18  IMPRESSION: No mammographic evidence of LEFT breast malignancy.   05/28/2018 Cancer Staging   Staging form: Breast, AJCC 8th Edition - Clinical stage from 05/28/2018: Stage IB (cT2, cN1, cM0, G3, ER+, PR+, HER2+) - Signed by [name], MD on 05/28/2018   06/03/2018 Imaging   MR BREAST BILATERAL W WO CONTRAST INC CAD  IMPRESSION: 1. Large enhancing masses and non mass enhancement identified throughout the majority of the right breast. Additionally, there are smaller masses identified within the upper inner and lower inner right breast.  Overall findings are concerning for extensive malignancy throughout all 4  quadrants of the right breast. 2. Extensive bulky lymphadenopathy identified within the right axilla. The matted appearance makes counting the number of abnormal lymph nodes difficult as they are immediately approximated to each other. 3. At least one abnormal appearing right internal mammary lymph node. 4. Nonenhancing lesion within the sternum with associated sternal marrow heterogeneity, raising the possibility of osseous metastatic disease within the sternum. 5. Two enhancing masses within the left breast as described above.     06/05/2018 PET scan   NM PET Image Initial (PI) Skull Base To Thigh  IMPRESSION: 1. Multiple borderline enlarged right axillary lymph nodes identified exhibiting mild to moderate increased uptake. Can not rule out nodal metastasis. Correlation with biopsy recommended. 2. Asymmetric increased uptake within the right breast corresponding to MRI abnormality. 3. Subcentimeter right retropectoral and right internal mammary lymph nodes are again identified and exhibit nonspecific, low level FDG uptake. 4. Noncalcified solid nodule within the lateral right lower lobe measures 9 mm without significant radiotracer uptake. 5. No hypermetabolic osseous lesions identified.     06/09/2018 - 11/03/2018 Chemotherapy   Neoadjuvant chemotherapy TCHP, 06/09/2018-09/22/18. Followed by Maintenance Herceptin/Perjeta q3weeks starting 10/14/18 to continue 1 year treatment. Stopped Herceptin/Perjeta on 11/03/18 due to multiple Positive LN of pathology.    06/10/2018 Pathology Results   Surgical pathology  Diagnosis 1. Breast, left, needle core biopsy, LOQ, barbell - MILD FIBROCYSTIC CHANGES. 2. Breast, left, needle core biopsy, UIQ cylinder - MILD FIBROCYSTIC CHANGES.    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 There were no deleterious mutations in AIP, ALK, APC, ATM, AXIN2,BAP1,  BARD1, BLM, BMPR1A, BRCA1, BRCA2, BRIP1, CASR, CDC73, CDH1, CDK4, CDKN1B, CDKN1C, CDKN2A (p14ARF), CDKN2A (p16INK4a), CEBPA, CHEK2, CTNNA1, DICER1, DIS3L2, EGFR (c.2369C>T, p.Thr790Met variant only), EPCAM (Deletion/duplication testing only), FH, FLCN, GATA2, GPC3, GREM1 (Promoter region deletion/duplication testing only), HOXB13 (c.251G>A, p.Gly84Glu), HRAS, KIT, MAX, MEN1, MET, MITF (c.952G>A, p.Glu318Lys variant only), MLH1, MSH2, MSH3, MSH6, MUTYH, NBN, NF1, NF2, NTHL1, PDGFRA, PHOX2B, PMS2, POLD1, POLE, POT1, PRKAR1A, PTCH1, PTEN, RAD50, RAD51C, RAD51D, RB1, RECQL4, RET, RUNX1, SDHAF2, SDHA (sequence changes only), SDHB, SDHC, SDHD, SMAD4, SMARCA4, SMARCB1, SMARCE1, STK11, SUFU, TERC, TERT, TMEM127, TP53, TSC1, TSC2, VHL, WRN and WT1.  .   09/24/2018 Breast MRI   Breast [facility] on 09/24/18 Right breast: Signal void inferior right breast, posterior depth from biopsy marker clip. Interval decreased size of the right breast, and now symmetric to the left, compared to prior MRI. Resolution of abnormal nonmass enhancement without evidence of residual enhancing mass. No evidence of axillary or internal mammary lymphadenopathy is seen, markedly improved from prior. There is no abnormal skin, nipple, or pectoralis muscle enhancement.  Left breast: There are no suspicious enhancing masses or areas of non-mass enhancement. No evidence of axillary or internal mammary lymphadenopathy is seen. There is no abnormal skin, nipple, or pectoralis muscle enhancement.   10/09/2018 Imaging   CT chest W Contrast 10/09/18  IMPRESSION: 1. Stable 10 mm right lower lobe pulmonary nodule since 06/05/2018. Nodule is not substantially hypermetabolic on the PET-CT. Continued attention on follow-up recommended. 2. No findings to suggest metastatic disease in the thorax.   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 - Biopsy clips identified - Margin status (see extended anterior margin below)  Invasive carcinoma: Positive anterior-inferior margin  Ductal carcinoma in situ: Positive anterior-inferior margin - Ancillary studies previously reported on [id number](5:00, ribbon clip) Estrogen receptor: Positive (80%) Progesterone receptor: Positive (10%) HER2 IHC: Equivocal (2+) HER2 FISH: Negative HER2/CEP17 ratio: 1.35 Mean HER2 copy number: 1.55 - Ancillary studies previously reported on [id number] (right axillary lymph node) Estrogen receptor: Positive (90%) Progesterone receptor: Positive (20%) HER2 IHC: Positive (3+) - Residual Cancer Burden  Tumor bed size: 41 mm x 20 mm Overall tumor cellularity: 10% Percentage in situ: 60% Number of involved lymph nodes: 7 Diameter of largest metastasis: 6 mm Residual Cancer Burden Score: 3.108 Residual Cancer Burden Class: RCB-II - Other findings: Atypical lobular hyperplasia and columnar cell change with microcalcifications; fibroadenomas with microcalcifications  B: Right axilla, targeted lymph node excision - Six of seven lymph nodes positive for carcinoma (6/7) - Size of largest tumor deposit: 6 mm - Biopsy site identified - Treatment effect: Present - Extracapsular extension: Absent  C: Right axilla, lymph node dissection - One of seven lymph nodes positive for carcinoma, macrometastasis, 6 mm (1/7) - Treatment effect: Absent - Extracapsular extension: Absent  D: Right breast, nipple core biopsy - Negative for carcinoma  E: Breast, right, anterior margin, excision - Microscopic foci of ductal carcinoma in situ - Margin:  Negative, <1 mm    11/05/2018 Cancer Staging   Staging form: Breast, AJCC 8th Edition - Pathologic stage from 11/05/2018: No Stage Recommended (ypT1c, pN2a, cM0, G2, ER+, PR+, HER2-) - Signed by [name], MD on 11/23/2018   11/24/2018 - 08/24/2019 Chemotherapy   Due to positive LN on surgical pathology her maintance Herceptin/Perjeta was changed to [place] for about 14 cycles on 11/24/18. Completed on 08/24/19   12/24/2018 - 02/03/2019 Radiation Therapy   adjuvant radiation at [facility] on 12/24/18 and completed on 02/03/19.    01/23/2019 Imaging   CT Chest wo  IMPRESSION: 1. Stable right lower lobe pulmonary nodule. 2. No signs of new or suspicious finding since study of 10/09/2018, now status post right mastectomy, axillary dissection and breast reconstruction.   02/16/2019 -  Anti-estrogen oral therapy   -Monthly Zoladex injection started 12/19/18 -Letrozole 2.5 mg started 02/16/19    04/20/2019 Survivorship   SCP delivered by Cira Rue, NP    09/22/2019 -  Chemotherapy   Nerlynx starting at 4 tablets (160 mg total) by mouth daily for 7 days, THEN 5 tablets (200 mg total) daily for 7 days, THEN 6 tablets (240 mg total) daily for 16 days, starting on 09/22/19.       CURRENT THERAPY:  -Monthly Zoladex injection started 12/19/18 -Letrozole 2.5 mg started 02/16/19 -Nerlynx starting on 09/22/19 titrated up to 226m over 3 weeks.   INTERVAL HISTORY:  Kim [name] here for a follow up. She presents to the clinic alone. She is currently on 2476mNerlynx for the past week. She notes she still has no diarrhea. She is still constipated. She is not on imodium. She is still on Budesonide. She notes she has been eating adequately. She has lost 5 pounds in 2 weeks. She has been more active at home. She notes upper left posterior thigh rash that started 4 days  ago. This has started to move down her leg and her right leg. This does cause itching. She tried benadryl but this did not help.       REVIEW OF SYSTEMS:   Constitutional: Denies fevers, chills or abnormal weight loss Eyes: Denies blurriness of vision Ears, nose, mouth, throat, and face: Denies mucositis or sore throat Respiratory: Denies cough, dyspnea or wheezes Cardiovascular: Denies palpitation, chest discomfort  Gastrointestinal:  Denies nausea, heartburn (+) Constipation Skin: (+) LE skin rash  Lymphatics: Denies new lymphadenopathy or easy bruising Neurological:Denies numbness, tingling or new weaknesses Behavioral/Psych: Mood is stable, no new changes  All other systems were reviewed with the patient and are negative.  MEDICAL HISTORY:  Past Medical History:  Diagnosis Date  . Acid reflux   . Acute bronchitis   . Anemia   . Asthma   . Family history of brain cancer   . Family history of breast cancer   . Family history of prostate cancer   . Migraine     SURGICAL HISTORY: Past Surgical History:  Procedure Laterality Date  . BREAST BIOPSY Right 05/23/2018   x3, malignant  . DILATION AND CURETTAGE OF UTERUS     2006 x1, 2009 x2  . LAPAROTOMY  2008  . PORTACATH PLACEMENT Left 06/06/2018   Procedure: INSERTION PORT-A-CATH POSSIBLE ULTRASOUND;  Surgeon: [name], MD;  Location: [place];  Service: General;  Laterality: Left;    I have reviewed the social history and family history with the patient and they are unchanged from previous note.  ALLERGIES:  has No Known Allergies.  MEDICATIONS:  Current Outpatient Medications  Medication Sig Dispense Refill  . albuterol (PROVENTIL HFA;VENTOLIN HFA) 108 (90 BASE) MCG/ACT inhaler Inhale 2 puffs into the lungs every 6 (six) hours as needed for wheezing or shortness of breath.    . ALPRAZolam (XANAX) 0.5 MG tablet Take 0.25-0.5 mg by mouth at bedtime as needed for anxiety.     Haze Justin Probiotic (BIOGAIA PROBIOTIC) LIQD Take by mouth daily at 8 pm.    . Budesonide ER 9 MG TB24 TAKE 1 TABLET BY MOUTH EVERY DAY 30 tablet 1  .  Cholecalciferol (VITAMIN D) 50 MCG (2000 UT) CAPS Take 2,000 Units by mouth daily.     . diphenhydrAMINE (BENADRYL) 25 mg capsule Take 25 mg by mouth at bedtime as needed for sleep.    Marland Kitchen ibuprofen (ADVIL) 200 MG tablet Take 400 mg by mouth every 6 (six) hours as needed for headache or moderate pain.    Marland Kitchen letrozole (FEMARA) 2.5 MG tablet TAKE 1 TABLET BY MOUTH EVERY DAY 90 tablet 1  . Melatonin 3 MG CAPS Take 3 mg by mouth at bedtime.    . Polyethylene Glycol 3350 (MIRALAX PO) Take 17 g by mouth daily as needed.     . propranolol (INDERAL) 10 MG tablet Take 5 mg by mouth daily as needed (for shaking hands).     . sertraline (ZOLOFT) 100 MG tablet Take 1 tablet (100 mg total) by mouth daily. 90 tablet 2  . zolpidem (AMBIEN) 5 MG tablet Take 5 mg by mouth at bedtime as needed.     No current facility-administered medications for this visit.    PHYSICAL EXAMINATION: ECOG PERFORMANCE STATUS: 0 - Asymptomatic  Vitals:   10/13/19 1208  BP: 109/73  Pulse: (!) 56  Resp: 17  Temp: 98.1 F (36.7 C)  SpO2: 100%   Filed Weights   10/13/19 1208  Weight: 135  lb 8 oz (61.5 kg)    GENERAL:alert, no distress and comfortable SKIN: (+) papular dime size skin rashes in upper b/l posterior thighs and few below knees   EYES: normal, Conjunctiva are pink and non-injected, sclera clear  NECK: supple, thyroid normal size, non-tender, without nodularity LYMPH:  no palpable lymphadenopathy in the cervical, axillary  LUNGS: clear to auscultation and percussion with normal breathing effort HEART: regular rate & rhythm and no murmurs and no lower extremity edema ABDOMEN:abdomen soft, non-tender and normal bowel sounds Musculoskeletal:no cyanosis of digits and no clubbing  NEURO: alert & oriented x 3 with fluent speech, no focal motor/sensory deficits  LABORATORY DATA:  I have reviewed the data as listed CBC Latest Ref Rng & Units 10/13/2019 09/28/2019 08/24/2019  WBC 4.0 - 10.5 K/uL 3.2(L) 2.6(L) 3.0(L)   Hemoglobin 12.0 - 15.0 g/dL 11.7(L) 10.1(L) 11.1(L)  Hematocrit 36 - 46 % 36.6 31.5(L) 35.1(L)  Platelets 150 - 400 K/uL 93(L) 68(L) 111(L)     CMP Latest Ref Rng & Units 10/13/2019 09/28/2019 08/24/2019  Glucose 70 - 99 mg/dL 111(H) 118(H) 93  BUN 6 - 20 mg/dL _0 Creatinine 0.44 - 1.00 mg/dL 0.83 0.78 0.77  Sodium 135 - 145 mmol/L 141 143 144  Potassium 3.5 - 5.1 mmol/L 4.5 4.1 4.1  Chloride 98 - 111 mmol/L 106 107 106  CO2 22 - 32 mmol/L _1 Calcium 8.9 - 10.3 mg/dL 9.6 9.4 9.6  Total Protein 6.5 - 8.1 g/dL 7.4 7.3 7.6  Total Bilirubin 0.3 - 1.2 mg/dL 0.4 0.5 0.4  Alkaline Phos 38 - 126 U/L 86 84 114  AST 15 - 41 U/L _2 ALT 0 - 44 U/L _3 RADIOGRAPHIC STUDIES: I have personally reviewed the radiological images as listed and agreed with the findings in the report. No results found.   ASSESSMENT & PLAN:  Kim [name] is a 44 y.o. 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 -She plans to have reconstructive surgery and prophylactic left mastectomy in [facility]. Her surgeon plans to obtain breast MRI prior to that.I recommend she have PAC removed with surgery. -Given her ER/PR positive disease I started her on antiestrogen therapy with monthly Zoladex injection in 12/2018 and on Letrozole in 02/2019.Tolerating well with manageable hot flashes and mood swings, which has improved with Zoloft. -Due to positive LNs completed 14 cycles of adjuvant Kadcyla on 08/24/19.  -To further reduce her risk of recurrence I discussed anti-HER2 treatment with Nerlynx which she  started 09/22/19.  -She has tolerated and increase Nerlynx to 252m. She still has constipation. She will continue Budesonide for the next week and continue to hold Imodium.  -Labs reviewed, and adequate to continue Nerlynx 2474m  -Continue Letrozole daily and monthly Zoladex injections.  -[facility]  -F/uin 6 weeks    2.Genetics:PALB2m13mtion+ -Genetic testingfrom [facility] done 05/2018 was positive for PALB2mu90mion. She has discovered a stronger maternal family history of prostate and breast cancerin her1st,2nd and 3rd degree relatives, Including her mother who was recently diagnosed. -We discussed the risk of future breast cancer from PALB2mut54mon. Given her familyhistory, she plans  to doprophylactic left mastectomy and b/lreconstructionsurgeryinsummer of [facility]. -While there is an increased risk for other cancers, there are no current guidelines for screening for these cancers, and at this time NCCN does not suggest a discussion of risk reducing salpingo-oophorectomy. -She has talked to GYN about her BSOand Hysterectomy inlate2021.  3. Asthma  -mild, continuealbuterol inhaler as needed -Ipreviouslydiscussed she is at high risk for infection.If she develops fever or productive cough with phlegm she can contact our clinic. 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 She is off chemo now.  -She has received both her COVID19 vaccines. I discussed she can participate in boost shot. It is up to her if she wishes to try booster shot while in clinical trail. It may be helpful to check her COVID antibody level with her PCP.  -I encouraged her to continue COVID19 precautions. -Labs improving, WBC 3.2, HG 11.7, plt 93K (10/13/19).   5. 45m RLL lung nodule  -Her initial2/20/20PET showed a 9 mm solid rounded  nodule within the lateral right lower lobe, not hypermetabolic -Her attempted lung biopsy was unsuccessful. Will monitor -Her 3/26/21CT chest showed stable lung nodule, no new findings.Will continue to monitor for 2 years, next scan 6 months out from last scan.   6.Insomnia, Stress/Anxiety -Shehas been having insomnia issue since cancer diagnosis, shenotes benadryl has not worked and she has been on melatonin for years.  -She currently uses half to whole Ambien or half xanax to get her to sleep. -She does have mood swings and hot flashes from Letrozole. She feels she is managing her mood and any anxiety/depression well with exercise, coping tools and she has a good support system. But feels at night or napping her stress and mood get to her.  -Tohelp her mood, sleep and hot flashes, I started her on Zoloft (03/30/19).She is currently on 1048mand sleep and mood has been doing well.   7.Osteopenia -I reviewed AI such as letrozole can weaken her bone  -Her baseline DEXA from 07/21/19 shows osteopenia with lowest T-score -1.3 at left femur neck. Will monitor on AI with repeat scan every 2 years. -She will continue Calcium and Vit D along with weight bearing exercise.  -I discussed if worsened bone density I may recommend Bisphosphonate treatment with Zometa infusion every 6 months for 2 years to strengthen her bones. I discussed this can also reduce her risk of bone metastasis.   8. Skin Rash in LEs -Since 6/25 she had a patch of raised red skin on her upper left posterior thigh. This has progressed down her leg and right leg.  -She has tried benadryl and antifungal creams with no change. She has not new topical or food changes. She has been sweating in long pants for a week working on her house. I discussed there is a possibility this is a reaction to Nerlynx but has been on this for 4 weeks. If needed we will hold this.  -She can also f/u with PCP or dermatologist to have rash tested  for fungal infection.  -She can try topical steroids or topical benadryl.    Plan - Zoladex injection in 2 and 6 weeks   -lab and F/u in 6 weeks  -Continue Nerlynx 24027maily and continue Letrozole    No problem-specific Assessment & Plan notes found for this encounter.   No orders of the defined types were placed in this encounter.  All questions  were answered. The patient knows to call the clinic with any problems, questions or concerns. No barriers to learning was detected. The total time spent in the appointment was 30 minutes.     [name], MD 10/13/2019   I, Joslyn Devon, am acting as scribe for [name], MD.   I have reviewed the above documentation for accuracy and completeness, and I agree with the above.

## 2019-10-10 ENCOUNTER — Other Ambulatory Visit: Payer: Self-pay | Admitting: Hematology

## 2019-10-12 ENCOUNTER — Other Ambulatory Visit: Payer: Self-pay | Admitting: Hematology

## 2019-10-13 ENCOUNTER — Other Ambulatory Visit: Payer: Self-pay | Admitting: Hematology

## 2019-10-13 ENCOUNTER — Other Ambulatory Visit: Payer: Self-pay

## 2019-10-13 ENCOUNTER — Inpatient Hospital Stay (HOSPITAL_BASED_OUTPATIENT_CLINIC_OR_DEPARTMENT_OTHER): Payer: BC Managed Care – PPO | Admitting: Hematology

## 2019-10-13 ENCOUNTER — Inpatient Hospital Stay: Payer: BC Managed Care – PPO

## 2019-10-13 VITALS — BP 109/73 | HR 56 | Temp 98.1°F | Resp 17 | Ht 69.0 in | Wt 135.5 lb

## 2019-10-13 DIAGNOSIS — C50811 Malignant neoplasm of overlapping sites of right female breast: Secondary | ICD-10-CM | POA: Diagnosis not present

## 2019-10-13 DIAGNOSIS — Z170 Estrogen receptor positive status [ER+]: Secondary | ICD-10-CM

## 2019-10-13 DIAGNOSIS — Z5111 Encounter for antineoplastic chemotherapy: Secondary | ICD-10-CM | POA: Diagnosis not present

## 2019-10-13 LAB — CBC WITH DIFFERENTIAL (CANCER CENTER ONLY)
Abs Immature Granulocytes: 0 10*3/uL (ref 0.00–0.07)
Basophils Absolute: 0 10*3/uL (ref 0.0–0.1)
Basophils Relative: 1 %
Eosinophils Absolute: 0.2 10*3/uL (ref 0.0–0.5)
Eosinophils Relative: 5 %
HCT: 36.6 % (ref 36.0–46.0)
Hemoglobin: 11.7 g/dL — ABNORMAL LOW (ref 12.0–15.0)
Immature Granulocytes: 0 %
Lymphocytes Relative: 26 %
Lymphs Abs: 0.8 10*3/uL (ref 0.7–4.0)
MCH: 29.8 pg (ref 26.0–34.0)
MCHC: 32 g/dL (ref 30.0–36.0)
MCV: 93.4 fL (ref 80.0–100.0)
Monocytes Absolute: 0.3 10*3/uL (ref 0.1–1.0)
Monocytes Relative: 10 %
Neutro Abs: 1.9 10*3/uL (ref 1.7–7.7)
Neutrophils Relative %: 58 %
Platelet Count: 93 10*3/uL — ABNORMAL LOW (ref 150–400)
RBC: 3.92 MIL/uL (ref 3.87–5.11)
RDW: 14.9 % (ref 11.5–15.5)
WBC Count: 3.2 10*3/uL — ABNORMAL LOW (ref 4.0–10.5)
nRBC: 0 % (ref 0.0–0.2)

## 2019-10-13 LAB — CMP (CANCER CENTER ONLY)
ALT: 24 U/L (ref 0–44)
AST: 16 U/L (ref 15–41)
Albumin: 4.2 g/dL (ref 3.5–5.0)
Alkaline Phosphatase: 86 U/L (ref 38–126)
Anion gap: 5 (ref 5–15)
BUN: 11 mg/dL (ref 6–20)
CO2: 30 mmol/L (ref 22–32)
Calcium: 9.6 mg/dL (ref 8.9–10.3)
Chloride: 106 mmol/L (ref 98–111)
Creatinine: 0.83 mg/dL (ref 0.44–1.00)
GFR, Est AFR Am: >60 mL/min (ref >60)
GFR, Estimated: >60 mL/min (ref >60)
Glucose, Bld: 111 mg/dL — ABNORMAL HIGH (ref 70–99)
Potassium: 4.5 mmol/L (ref 3.5–5.1)
Sodium: 141 mmol/L (ref 135–145)
Total Bilirubin: 0.4 mg/dL (ref 0.3–1.2)
Total Protein: 7.4 g/dL (ref 6.5–8.1)

## 2019-10-14 ENCOUNTER — Telehealth: Payer: Self-pay | Admitting: Hematology

## 2019-10-14 NOTE — Telephone Encounter (Signed)
Scheduled per 6/29 los. Unable to reach pt. Left voicemail with appt times and date.

## 2019-10-15 ENCOUNTER — Other Ambulatory Visit: Payer: Self-pay | Admitting: Hematology

## 2019-10-15 ENCOUNTER — Other Ambulatory Visit: Payer: Self-pay

## 2019-10-15 DIAGNOSIS — C50811 Malignant neoplasm of overlapping sites of right female breast: Secondary | ICD-10-CM

## 2019-10-15 DIAGNOSIS — Z170 Estrogen receptor positive status [ER+]: Secondary | ICD-10-CM

## 2019-10-15 MED ORDER — NERATINIB MALEATE 40 MG PO TABS: ORAL_TABLET | ORAL | 1 refills | Status: DC

## 2019-10-15 MED ORDER — NERATINIB MALEATE 40 MG PO TABS: 240.0000 mg | ORAL_TABLET | Freq: Every day | ORAL | 3 refills | Status: DC

## 2019-10-15 NOTE — Progress Notes (Signed)
Per Wynn Maudlin, Nerlynx Rx to be sent to [facility].

## 2019-10-16 ENCOUNTER — Encounter: Payer: Self-pay | Admitting: Hematology

## 2019-10-21 ENCOUNTER — Encounter: Payer: Self-pay | Admitting: Hematology

## 2019-10-26 ENCOUNTER — Inpatient Hospital Stay: Payer: BC Managed Care – PPO | Attending: Hematology

## 2019-10-26 ENCOUNTER — Other Ambulatory Visit: Payer: BC Managed Care – PPO

## 2019-10-26 ENCOUNTER — Other Ambulatory Visit: Payer: Self-pay

## 2019-10-26 VITALS — BP 112/82 | HR 65 | Resp 18

## 2019-10-26 DIAGNOSIS — Z5111 Encounter for antineoplastic chemotherapy: Secondary | ICD-10-CM | POA: Insufficient documentation

## 2019-10-26 DIAGNOSIS — Z452 Encounter for adjustment and management of vascular access device: Secondary | ICD-10-CM | POA: Diagnosis not present

## 2019-10-26 DIAGNOSIS — C50811 Malignant neoplasm of overlapping sites of right female breast: Secondary | ICD-10-CM | POA: Insufficient documentation

## 2019-10-26 DIAGNOSIS — Z95828 Presence of other vascular implants and grafts: Secondary | ICD-10-CM

## 2019-10-26 MED ORDER — GOSERELIN ACETATE 3.6 MG ~~LOC~~ IMPL
3.6000 mg | DRUG_IMPLANT | Freq: Once | SUBCUTANEOUS | Status: AC
  Administered 2019-10-26: 3.6 mg via SUBCUTANEOUS

## 2019-10-26 MED ORDER — HEPARIN SOD (PORK) LOCK FLUSH 100 UNIT/ML IV SOLN
500.0000 [IU] | Freq: Once | INTRAVENOUS | Status: AC | PRN
  Administered 2019-10-26: 500 [IU]
  Filled 2019-10-26: qty 5

## 2019-10-26 MED ORDER — SODIUM CHLORIDE 0.9% FLUSH
10.0000 mL | INTRAVENOUS | Status: DC | PRN
  Administered 2019-10-26: 10 mL
  Filled 2019-10-26: qty 10

## 2019-10-26 MED ORDER — GOSERELIN ACETATE 3.6 MG ~~LOC~~ IMPL
DRUG_IMPLANT | SUBCUTANEOUS | Status: AC
  Filled 2019-10-26: qty 3.6

## 2019-10-26 NOTE — Patient Instructions (Signed)

## 2019-10-27 ENCOUNTER — Other Ambulatory Visit: Payer: Self-pay | Admitting: Hematology

## 2019-10-30 ENCOUNTER — Other Ambulatory Visit: Payer: Self-pay | Admitting: Hematology

## 2019-10-30 DIAGNOSIS — C50811 Malignant neoplasm of overlapping sites of right female breast: Secondary | ICD-10-CM

## 2019-11-13 ENCOUNTER — Encounter: Payer: Self-pay | Admitting: Hematology

## 2019-11-23 ENCOUNTER — Ambulatory Visit: Payer: BC Managed Care – PPO

## 2019-11-23 ENCOUNTER — Other Ambulatory Visit: Payer: BC Managed Care – PPO

## 2019-11-23 ENCOUNTER — Ambulatory Visit: Payer: BC Managed Care – PPO | Admitting: Hematology

## 2019-11-27 ENCOUNTER — Other Ambulatory Visit: Payer: Self-pay | Admitting: Hematology

## 2019-11-29 NOTE — Progress Notes (Signed)
[name]   [phone] [phone]   Clinic Follow up Note   Patient Care Team: [name], MD as PCP - General (Family Medicine) [name], MD as Consulting Physician (General Surgery) [name], MD as Consulting Physician (Hematology) [name], MD as Consulting Physician (Radiation Oncology) [name], MD as Consulting Physician (Obstetrics and Gynecology) [name], RN as Oncology Nurse Navigator [name], RN as Oncology Nurse Navigator [name], NP as Nurse Practitioner (Nurse Practitioner) 11/30/2019  CHIEF COMPLAINT: F/u right breast cancer   SUMMARY OF ONCOLOGIC HISTORY: Oncology History Overview Note  Cancer Staging Cancer of overlapping sites of right female breast [facility]) Staging form: Breast, AJCC 8th Edition - Clinical stage from 05/28/2018: Stage IB (cT2, cN1, cM0, G3, ER+, PR+, HER2+) - Signed by [name], MD on 05/28/2018  Malignant neoplasm of lower-outer quadrant of right breast of female, estrogen receptor positive ([place]) Staging form: Breast, AJCC 8th Edition - Clinical stage from 05/28/2018: Stage IIB (cT2, cN1, cM0, G3, ER+, PR+, HER2-) - Unsigned     Cancer of overlapping sites of right female breast ([place])  05/14/2018 Mammogram   Right breast mammogram 05/14/18  IMPRESSION  There is a 1.0 x 0.5 x 1.2 cm lobular hypoechoic mass right breast 7 o'clock position 3 cm from nipple, a 0.8 x 0.6 x 1.2 cm lobular hypoechoic mass right breast 7 o'clock position 2 cm from the nipple, and a 0.9 x 0.5 x 0.8 cm lobular hypoechoic mass right breast 5 o'clock position 3 cm from nipple.  There is a 3.8 x 1.0 x 1.3 cm lobular hypoechoic mass right breast 5 o'clock position 2 cm from nipple.  Multiple thickened right axillary lymph nodes (approximately 15). Measuring up to 1.0 cm in thickness. Adenopathy corresponds with axillary palpable abnormality.  Indeterminate round and punctate  calcifications upper-outer quadrant right breast.Indeterminate round and punctate calcifications upper-outer quadrant right breast.   05/21/2018 Initial Biopsy   Diagnosis 05/21/18  1. Breast, right, needle core biopsy, axilla, 5 o'clock, ribbon clip - INVASIVE MAMMARY CARCINOMA. - MAMMARY CARCINOMA IN SITU. 2. Breast, right, needle core biopsy, 7 o'clock, coil clip - INVASIVE MAMMARY CARCINOMA. - MAMMARY CARCINOMA IN SITU. 3. Lymph node, needle/core biopsy, right axillary LN, hydromark - METASTATIC MAMMARY CARCINOMA.    05/21/2018 Receptors her2   1. PROGNOSTIC INDICATORS Results: The tumor cells are EQUIVOCAL for Her2 (2+);  HER2 **NEGATIVE** by FISH   Estrogen Receptor: 80%, POSITIVE, MODERATE STAINING INTENSITY Progesterone Receptor: 5%, POSITIVE, MODERATE-WEAK STAINING INTENSITY Proliferation Marker Ki67: 10%  3. PROGNOSTIC INDICATORS Results: The tumor cells are POSITIVE for Her2 (3+). Estrogen Receptor: 90%, POSITIVE, STRONG STAINING INTENSITY Progesterone Receptor: 50%, POSITIVE, MODERATE STAINING INTENSITY Proliferation Marker Ki67: 5%   05/26/2018 Initial Diagnosis   Malignant neoplasm of lower-inner quadrant of right breast of female, estrogen receptor positive ([place])   05/27/2018 Mammogram   Left breast mammogram 05/27/18  IMPRESSION: No mammographic evidence of LEFT breast malignancy.   05/28/2018 Cancer Staging   Staging form: Breast, AJCC 8th Edition - Clinical stage from 05/28/2018: Stage IB (cT2, cN1, cM0, G3, ER+, PR+, HER2+) - Signed by [name], MD on 05/28/2018   06/03/2018 Imaging   MR BREAST BILATERAL W WO CONTRAST INC CAD  IMPRESSION: 1. Large enhancing masses and non mass enhancement identified throughout the majority of the right breast. Additionally, there are smaller masses identified within the upper inner and lower inner right breast. Overall findings are concerning for extensive malignancy throughout  all 4 quadrants of the right breast. 2.  Extensive bulky lymphadenopathy identified within the right axilla. The matted appearance makes counting the number of abnormal lymph nodes difficult as they are immediately approximated to each other. 3. At least one abnormal appearing right internal mammary lymph node. 4. Nonenhancing lesion within the sternum with associated sternal marrow heterogeneity, raising the possibility of osseous metastatic disease within the sternum. 5. Two enhancing masses within the left breast as described above.     06/05/2018 PET scan   NM PET Image Initial (PI) Skull Base To Thigh  IMPRESSION: 1. Multiple borderline enlarged right axillary lymph nodes identified exhibiting mild to moderate increased uptake. Can not rule out nodal metastasis. Correlation with biopsy recommended. 2. Asymmetric increased uptake within the right breast corresponding to MRI abnormality. 3. Subcentimeter right retropectoral and right internal mammary lymph nodes are again identified and exhibit nonspecific, low level FDG uptake. 4. Noncalcified solid nodule within the lateral right lower lobe measures 9 mm without significant radiotracer uptake. 5. No hypermetabolic osseous lesions identified.     06/09/2018 - 11/03/2018 Chemotherapy   Neoadjuvant chemotherapy TCHP, 06/09/2018-09/22/18. Followed by Maintenance Herceptin/Perjeta q3weeks starting 10/14/18 to continue 1 year treatment. Stopped Herceptin/Perjeta on 11/03/18 due to multiple Positive LN of pathology.    06/10/2018 Pathology Results   Surgical pathology  Diagnosis 1. Breast, left, needle core biopsy, LOQ, barbell - MILD FIBROCYSTIC CHANGES. 2. Breast, left, needle core biopsy, UIQ cylinder - MILD FIBROCYSTIC CHANGES.    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 There were no deleterious  mutations in AIP, ALK, APC, ATM, AXIN2,BAP1,  BARD1, BLM, BMPR1A, BRCA1, BRCA2, BRIP1, CASR, CDC73, CDH1, CDK4, CDKN1B, CDKN1C, CDKN2A (p14ARF), CDKN2A (p16INK4a), CEBPA, CHEK2, CTNNA1, DICER1, DIS3L2, EGFR (c.2369C>T, p.Thr790Met variant only), EPCAM (Deletion/duplication testing only), FH, FLCN, GATA2, GPC3, GREM1 (Promoter region deletion/duplication testing only), HOXB13 (c.251G>A, p.Gly84Glu), HRAS, KIT, MAX, MEN1, MET, MITF (c.952G>A, p.Glu318Lys variant only), MLH1, MSH2, MSH3, MSH6, MUTYH, NBN, NF1, NF2, NTHL1, PDGFRA, PHOX2B, PMS2, POLD1, POLE, POT1, PRKAR1A, PTCH1, PTEN, RAD50, RAD51C, RAD51D, RB1, RECQL4, RET, RUNX1, SDHAF2, SDHA (sequence changes only), SDHB, SDHC, SDHD, SMAD4, SMARCA4, SMARCB1, SMARCE1, STK11, SUFU, TERC, TERT, TMEM127, TP53, TSC1, TSC2, VHL, WRN and WT1.  .   09/24/2018 Breast MRI   Breast MRI from [facility] on 09/24/18 Right breast: Signal void inferior right breast, posterior depth from biopsy marker clip. Interval decreased size of the right breast, and now symmetric to the left, compared to prior MRI. Resolution of abnormal nonmass enhancement without evidence of residual enhancing mass. No evidence of axillary or internal mammary lymphadenopathy is seen, markedly improved from prior. There is no abnormal skin, nipple, or pectoralis muscle enhancement.  Left breast: There are no suspicious enhancing masses or areas of non-mass enhancement. No evidence of axillary or internal mammary lymphadenopathy is seen. There is no abnormal skin, nipple, or pectoralis muscle enhancement.   10/09/2018 Imaging   CT chest W Contrast 10/09/18  IMPRESSION: 1. Stable 10 mm right lower lobe pulmonary nodule since 06/05/2018. Nodule is not substantially hypermetabolic on the PET-CT. Continued attention on follow-up recommended. 2. No findings to suggest metastatic disease in the thorax.   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 - Biopsy clips identified - Margin status (see extended anterior margin below)  Invasive carcinoma: Positive anterior-inferior margin  Ductal carcinoma in situ: Positive anterior-inferior margin - Ancillary studies previously reported on [id number](5:00, ribbon clip) Estrogen receptor: Positive (80%) Progesterone receptor: Positive (10%) HER2 IHC: Equivocal (2+) HER2 FISH: Negative HER2/CEP17 ratio: 1.35 Mean HER2 copy number: 1.55 - Ancillary studies previously reported on [id number] (right axillary lymph node) Estrogen receptor: Positive (90%) Progesterone receptor: Positive (20%) HER2 IHC: Positive (3+) - Residual Cancer Burden  Tumor bed size: 41 mm x 20 mm Overall tumor cellularity: 10% Percentage in situ: 60% Number of involved lymph nodes: 7 Diameter of largest metastasis: 6 mm Residual Cancer Burden Score: 3.108 Residual Cancer Burden Class: RCB-II - Other findings: Atypical lobular hyperplasia and columnar cell change with microcalcifications; fibroadenomas with microcalcifications  B: Right axilla, targeted lymph node excision - Six of seven lymph nodes positive for carcinoma (6/7) - Size of largest tumor deposit: 6 mm - Biopsy site identified - Treatment effect: Present - Extracapsular extension: Absent  C: Right axilla, lymph node dissection - One of seven lymph nodes positive for carcinoma, macrometastasis, 6 mm (1/7) - Treatment effect: Absent - Extracapsular extension: Absent  D: Right breast, nipple core biopsy - Negative for carcinoma  E: Breast, right, anterior margin, excision - Microscopic foci of ductal carcinoma in situ - Margin: Negative, <1 mm    11/05/2018 Cancer  Staging   Staging form: Breast, AJCC 8th Edition - Pathologic stage from 11/05/2018: No Stage Recommended (ypT1c, pN2a, cM0, G2, ER+, PR+, HER2-) - Signed by [name], MD on 11/23/2018   11/24/2018 - 08/24/2019 Chemotherapy   Due to positive LN on surgical pathology her maintance Herceptin/Perjeta was changed to [place] for about 14 cycles on 11/24/18. Completed on 08/24/19   12/24/2018 - 02/03/2019 Radiation Therapy   adjuvant radiation at [facility] on 12/24/18 and completed on 02/03/19.    01/23/2019 Imaging   CT Chest wo  IMPRESSION: 1. Stable right lower lobe pulmonary nodule. 2. No signs of new or suspicious finding since study of 10/09/2018, now status post right mastectomy, axillary dissection and breast reconstruction.   02/16/2019 -  Anti-estrogen oral therapy   -Monthly Zoladex injection started 12/19/18 -Letrozole 2.5 mg started 02/16/19    04/20/2019 Survivorship   SCP delivered by Cira Rue, NP    09/22/2019 -  Chemotherapy   Nerlynx starting at 4 tablets (160 mg total) by mouth daily for 7 days, THEN 5 tablets (200 mg total) daily for 7 days, THEN 6 tablets (240 mg total) daily for 16 days, starting on 09/22/19.      CURRENT THERAPY:   -Monthly Zoladex injection started 12/19/18 -Letrozole 2.5 mg started 02/16/19 -Nerlynx startingon 09/22/19 titrated up to 255m over 3 weeks.   INTERVAL HISTORY: Ms. BBesharareturns for f/u as scheduled.  Her family including herself, husband, and children as well as her elderly father all had Covid at the end of July, tested positive on 7/28.  Symptoms were low-grade fever, fatigue and cold-like symptoms. She recovered and [facility]  She continues Bactrim per plastic surgery, takes Flexeril at night.  She is healing well, pain is "minimal."  She continues Nerlynx and letrozole.  She has mild hot flashes, denies  bone/joint pain.  Has occasional nausea that is  managed with half tab Compazine in the evenings as needed.  She still has mild constipation, taking senna and MiraLAX as needed.  Denies diarrhea.  She feels more hungry lately, a little lightheaded today.  Denies bleeding, fever, chills, mucositis, cough, chest pain, dyspnea.    MEDICAL HISTORY:  Past Medical History:  Diagnosis Date  . Acid reflux   . Acute bronchitis   . Anemia   . Asthma   . Family history of brain cancer   . Family history of breast cancer   . Family history of prostate cancer   . Migraine     SURGICAL HISTORY: Past Surgical History:  Procedure Laterality Date  . BREAST BIOPSY Right 05/23/2018   x3, malignant  . DILATION AND CURETTAGE OF UTERUS     2006 x1, 2009 x2  . LAPAROTOMY  2008  . PORTACATH PLACEMENT Left 06/06/2018   Procedure: INSERTION PORT-A-CATH POSSIBLE ULTRASOUND;  Surgeon: [name], MD;  Location: [facility];  Service: General;  Laterality: Left;    I have reviewed the social history and family history with the patient and they are unchanged from previous note.  ALLERGIES:  has No Known Allergies.  MEDICATIONS:  Current Outpatient Medications  Medication Sig Dispense Refill  . albuterol (PROVENTIL HFA;VENTOLIN HFA) 108 (90 BASE) MCG/ACT inhaler Inhale 2 puffs into the lungs every 6 (six) hours as needed for wheezing or shortness of breath.    . ALPRAZolam (XANAX) 0.5 MG tablet Take 0.25-0.5 mg by mouth at bedtime as needed for anxiety.     Haze Justin Probiotic (BIOGAIA PROBIOTIC) LIQD Take by mouth daily at 8 pm.    . Cholecalciferol (VITAMIN D) 50 MCG (2000 UT) CAPS Take 2,000 Units by mouth daily.     Marland Kitchen ibuprofen (ADVIL) 200 MG tablet Take 400 mg by mouth every 6 (six) hours as needed for headache or moderate pain.    Marland Kitchen letrozole (FEMARA) 2.5 MG tablet TAKE 1 TABLET BY MOUTH EVERY DAY 90 tablet 1  . Melatonin 3 MG CAPS Take 3 mg by mouth at bedtime.    . Neratinib Maleate (NERLYNX) 40 MG tablet Take 6 tablets (240 mg  total) by mouth daily. Take with food. 180 tablet 3  . Polyethylene Glycol 3350 (MIRALAX PO) Take 17 g by mouth daily as needed.     . propranolol (INDERAL) 10 MG tablet Take 5 mg by mouth daily as needed (for shaking hands).     . zolpidem (AMBIEN) 5 MG tablet Take 5 mg by mouth at bedtime as needed.    . Budesonide ER 9 MG TB24 TAKE 1 TABLET BY MOUTH EVERY DAY 30 tablet 1  . diphenhydrAMINE (BENADRYL) 25 mg capsule Take 25 mg by mouth at bedtime as needed for sleep.    . Neratinib Maleate (NERLYNX) 40 MG tablet TAKE 4 TABLETS BY MOUTH DAILY FOR 7 DAYS, THEN 5 TABLETS DAILY FOR 7 DAYS, THEN 6 TABLETS DAILY FOR 16 DAYS. TAKE WITH FOOD. INCREASE AS TOLERATED 180 tablet 1  . sertraline (ZOLOFT) 100 MG tablet Take 1 tablet (100 mg total) by mouth daily. 90 tablet 2   No current facility-administered medications for this visit.    PHYSICAL EXAMINATION: ECOG PERFORMANCE STATUS: 1 - Symptomatic but completely ambulatory  Vitals:   11/30/19 0938  BP: 105/64  Pulse: 60  Resp: 17  Temp: (!) 97.5 F (36.4 C)  SpO2: 100%   Filed Weights  11/30/19 0938  Weight: 143 lb 14.4 oz (65.3 kg)    GENERAL:alert, no distress and comfortable SKIN: No rash to exposed skin  EYES: sclera clear LUNGS: clear with normal breathing effort HEART: regular rate & rhythm, no lower extremity edema NEURO: alert & oriented x 3 with fluent speech, neuro exam nonfocal Breast: S/p bilateral mastectomy with reconstruction, incisions closed, healing well without erythema or drainage.  More bruising to left breast implant with 2 JP drains in place.  LABORATORY DATA:  I have reviewed the data as listed CBC Latest Ref Rng & Units 11/30/2019 10/13/2019 09/28/2019  WBC 4.0 - 10.5 K/uL 3.2(L) 3.2(L) 2.6(L)  Hemoglobin 12.0 - 15.0 g/dL 10.5(L) 11.7(L) 10.1(L)  Hematocrit 36 - 46 % 33.4(L) 36.6 31.5(L)  Platelets 150 - 400 K/uL 142(L) 93(L) 68(L)     CMP Latest Ref Rng & Units 11/30/2019 10/13/2019 09/28/2019  Glucose 70 -  99 mg/dL 60(L) 111(H) 118(H)  BUN 6 - 20 mg/dL _0 Creatinine 0.44 - 1.00 mg/dL 0.91 0.83 0.78  Sodium 135 - 145 mmol/L 138 141 143  Potassium 3.5 - 5.1 mmol/L 4.4 4.5 4.1  Chloride 98 - 111 mmol/L 105 106 107  CO2 22 - 32 mmol/L _1 Calcium 8.9 - 10.3 mg/dL 10.0 9.6 9.4  Total Protein 6.5 - 8.1 g/dL 7.2 7.4 7.3  Total Bilirubin 0.3 - 1.2 mg/dL 0.4 0.4 0.5  Alkaline Phos 38 - 126 U/L 75 86 84  AST 15 - 41 U/L _2 ALT 0 - 44 U/L _3 RADIOGRAPHIC STUDIES: I have personally reviewed the radiological images as listed and agreed with the findings in the report. No results found.   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 She overall had good response to chemo as seen on Breast MRI. She also has 71m right lung nodule,which has been stable. S/p right mastectomyand ALNDon 10/21/18 [facility]  -To reduce her risk of recurrenceDr. FBurr MedicochangedmaintenanceHerceptin/Perjeta to KJim Likebased on the [facility].   She completed in 08/2019.  -To further reduce her risk of recurrence she started anti-HER-2 treatment with Nerlynx on 09/22/2019, tolerating full dose -S/p left prophylactic mastectomy with bilateral [facility]24[facility]19 [facility]  -Given ER/PR positive disease, she would benefit from anti-estrogen therapy.Due toAIbeingmore beneficial than Tamoxifeninher young age and high risk disease, she started ovarian suppression with Zoladex on 12/19/18 as she isstill prememopausal.Shewould like to have BSO per Ob/gyn around Thanksgiving, wants to get through Fall semester  first; tolerating zoladex and letrozole well with moderate but tolerable hot flashes.   2. 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 She has discovered a stronger maternal family history of prostate and breast cancerin her 2nd and 3rd degree relatives. -We discussed the risk of future breast cancer from PALB2mu56mion, which depends on her family history. -Dr. FengStormy Card risk for other cancers in carriers for PALB2 mutations, including female breast cancer, prostate, ovarian and pancreatic cancer, although these risks have not yet been quantified.  While there is an increased risk for other cancers, there are no current guidelines for screening for these cancers, and at this time NCCN does not suggest a discussion of risk reducing salpingo-oophorectomy. -Her mother, 7, was recently diagnosed with breast cancertriple negative, probably PALB2 mutation related. She will proceed with b/l mastectomy in 09/2018.Her Sister had an abnormal breast MRI and is undergoing biopsy. -S/p prophylactic left mastectomy on 8/10, BSO pending   4. Pancytopenia secondary to chemo/Kadcyla -Consider blood transfusion if Hg<7 and Udenyca if ANC <1.5 -Neutropenia resolved, anemia and thrombocytopenia are improved  5. 38m RLL lung nodule  -Her initial staging PET scan showed a 9 mm solid rounded nodule within the lateral right lower lobe, not hypermetabolic. Attempted lung biopsy was not successful.  -CT chest from 10/09/18 and 01/23/19 has been stable .  -Her attempted lung biopsy was unsuccessful.  Disposition: Ms. BGlahnappears stable.  She continues to tolerate full dose Nerlynx, daily letrozole, and q4 weeks Zoladex.  We will proceed with another injection today.  She  recently recovered from COVID-19 infection and underwent prophylactic left mastectomy and bilateral reconstruction.  She is recovering from surgery well, [facility]  The CBC and CMP were reviewed.  Pancytopenia is improving.  BG 60, she is symptomatic with lightheadedness.  She was given food and drink in clinic.  This is likely related to Bactrim which she is taking due to recent surgery.  I recommend for her to eat every 1-2 hours and remain well-hydrated.  This should resolve once she completes Bactrim.  She will return for Zoladex in 4 weeks, then lab and follow-up with Zoladex in 8 weeks.  All questions were answered. The patient knows to call the clinic with any problems, questions or concerns. No barriers to learning were detected.     LAlla Feeling NP 11/30/19

## 2019-11-30 ENCOUNTER — Encounter: Payer: Self-pay | Admitting: Nurse Practitioner

## 2019-11-30 ENCOUNTER — Inpatient Hospital Stay: Payer: BC Managed Care – PPO

## 2019-11-30 ENCOUNTER — Inpatient Hospital Stay (HOSPITAL_BASED_OUTPATIENT_CLINIC_OR_DEPARTMENT_OTHER): Payer: BC Managed Care – PPO | Admitting: Nurse Practitioner

## 2019-11-30 ENCOUNTER — Inpatient Hospital Stay: Payer: BC Managed Care – PPO | Attending: Hematology

## 2019-11-30 ENCOUNTER — Other Ambulatory Visit: Payer: Self-pay

## 2019-11-30 VITALS — BP 105/64 | HR 60 | Temp 97.5°F | Resp 17 | Ht 69.0 in | Wt 143.9 lb

## 2019-11-30 DIAGNOSIS — Z170 Estrogen receptor positive status [ER+]: Secondary | ICD-10-CM | POA: Insufficient documentation

## 2019-11-30 DIAGNOSIS — Z5111 Encounter for antineoplastic chemotherapy: Secondary | ICD-10-CM | POA: Diagnosis not present

## 2019-11-30 DIAGNOSIS — C50811 Malignant neoplasm of overlapping sites of right female breast: Secondary | ICD-10-CM

## 2019-11-30 DIAGNOSIS — K5900 Constipation, unspecified: Secondary | ICD-10-CM | POA: Diagnosis not present

## 2019-11-30 DIAGNOSIS — D61810 Antineoplastic chemotherapy induced pancytopenia: Secondary | ICD-10-CM | POA: Insufficient documentation

## 2019-11-30 DIAGNOSIS — Z95828 Presence of other vascular implants and grafts: Secondary | ICD-10-CM

## 2019-11-30 LAB — CMP (CANCER CENTER ONLY)
ALT: 22 U/L (ref 0–44)
AST: 16 U/L (ref 15–41)
Albumin: 3.9 g/dL (ref 3.5–5.0)
Alkaline Phosphatase: 75 U/L (ref 38–126)
Anion gap: 8 (ref 5–15)
BUN: 13 mg/dL (ref 6–20)
CO2: 25 mmol/L (ref 22–32)
Calcium: 10 mg/dL (ref 8.9–10.3)
Chloride: 105 mmol/L (ref 98–111)
Creatinine: 0.91 mg/dL (ref 0.44–1.00)
GFR, Est AFR Am: >60 mL/min (ref >60)
GFR, Estimated: >60 mL/min (ref >60)
Glucose, Bld: 60 mg/dL — ABNORMAL LOW (ref 70–99)
Potassium: 4.4 mmol/L (ref 3.5–5.1)
Sodium: 138 mmol/L (ref 135–145)
Total Bilirubin: 0.4 mg/dL (ref 0.3–1.2)
Total Protein: 7.2 g/dL (ref 6.5–8.1)

## 2019-11-30 LAB — CBC WITH DIFFERENTIAL (CANCER CENTER ONLY)
Abs Immature Granulocytes: 0.01 10*3/uL (ref 0.00–0.07)
Basophils Absolute: 0 10*3/uL (ref 0.0–0.1)
Basophils Relative: 1 %
Eosinophils Absolute: 0.1 10*3/uL (ref 0.0–0.5)
Eosinophils Relative: 4 %
HCT: 33.4 % — ABNORMAL LOW (ref 36.0–46.0)
Hemoglobin: 10.5 g/dL — ABNORMAL LOW (ref 12.0–15.0)
Immature Granulocytes: 0 %
Lymphocytes Relative: 21 %
Lymphs Abs: 0.7 10*3/uL (ref 0.7–4.0)
MCH: 29.2 pg (ref 26.0–34.0)
MCHC: 31.4 g/dL (ref 30.0–36.0)
MCV: 93 fL (ref 80.0–100.0)
Monocytes Absolute: 0.3 10*3/uL (ref 0.1–1.0)
Monocytes Relative: 8 %
Neutro Abs: 2.1 10*3/uL (ref 1.7–7.7)
Neutrophils Relative %: 66 %
Platelet Count: 142 10*3/uL — ABNORMAL LOW (ref 150–400)
RBC: 3.59 MIL/uL — ABNORMAL LOW (ref 3.87–5.11)
RDW: 15.8 % — ABNORMAL HIGH (ref 11.5–15.5)
WBC Count: 3.2 10*3/uL — ABNORMAL LOW (ref 4.0–10.5)
nRBC: 0 % (ref 0.0–0.2)

## 2019-11-30 MED ORDER — SERTRALINE HCL 100 MG PO TABS: 100.0000 mg | ORAL_TABLET | Freq: Every day | ORAL | 2 refills | Status: DC

## 2019-11-30 MED ORDER — GOSERELIN ACETATE 3.6 MG ~~LOC~~ IMPL
3.6000 mg | DRUG_IMPLANT | Freq: Once | SUBCUTANEOUS | Status: AC
  Administered 2019-11-30: 3.6 mg via SUBCUTANEOUS

## 2019-11-30 MED ORDER — GOSERELIN ACETATE 3.6 MG ~~LOC~~ IMPL
DRUG_IMPLANT | SUBCUTANEOUS | Status: AC
  Filled 2019-11-30: qty 3.6

## 2019-11-30 NOTE — Telephone Encounter (Signed)
Medications zoloft and femara refilled as requested

## 2019-11-30 NOTE — Patient Instructions (Signed)

## 2019-12-01 ENCOUNTER — Telehealth: Payer: Self-pay | Admitting: Nurse Practitioner

## 2019-12-01 NOTE — Telephone Encounter (Signed)
Scheduled per 8/16 los. Pt is aware of appt times and dates.

## 2019-12-23 ENCOUNTER — Telehealth: Payer: Self-pay | Admitting: Hematology

## 2019-12-23 NOTE — Telephone Encounter (Signed)
Rescheduled appts on 10/11 for pt to see LB. YF on PAL. Unable to reach pt. Left voicemail with appt time and date.

## 2019-12-24 ENCOUNTER — Encounter: Payer: Self-pay | Admitting: Nurse Practitioner

## 2019-12-28 ENCOUNTER — Other Ambulatory Visit: Payer: Self-pay

## 2019-12-28 ENCOUNTER — Inpatient Hospital Stay: Payer: BC Managed Care – PPO

## 2019-12-28 ENCOUNTER — Inpatient Hospital Stay: Payer: BC Managed Care – PPO | Attending: Hematology

## 2019-12-28 VITALS — BP 102/72 | HR 56 | Resp 18

## 2019-12-28 DIAGNOSIS — Z5111 Encounter for antineoplastic chemotherapy: Secondary | ICD-10-CM | POA: Diagnosis not present

## 2019-12-28 DIAGNOSIS — C50811 Malignant neoplasm of overlapping sites of right female breast: Secondary | ICD-10-CM

## 2019-12-28 DIAGNOSIS — Z95828 Presence of other vascular implants and grafts: Secondary | ICD-10-CM

## 2019-12-28 LAB — CBC WITH DIFFERENTIAL (CANCER CENTER ONLY)
Abs Immature Granulocytes: 0.01 10*3/uL (ref 0.00–0.07)
Basophils Absolute: 0 10*3/uL (ref 0.0–0.1)
Basophils Relative: 1 %
Eosinophils Absolute: 0.1 10*3/uL (ref 0.0–0.5)
Eosinophils Relative: 5 %
HCT: 29.9 % — ABNORMAL LOW (ref 36.0–46.0)
Hemoglobin: 9.5 g/dL — ABNORMAL LOW (ref 12.0–15.0)
Immature Granulocytes: 0 %
Lymphocytes Relative: 31 %
Lymphs Abs: 0.8 10*3/uL (ref 0.7–4.0)
MCH: 29.2 pg (ref 26.0–34.0)
MCHC: 31.8 g/dL (ref 30.0–36.0)
MCV: 92 fL (ref 80.0–100.0)
Monocytes Absolute: 0.2 10*3/uL (ref 0.1–1.0)
Monocytes Relative: 9 %
Neutro Abs: 1.4 10*3/uL — ABNORMAL LOW (ref 1.7–7.7)
Neutrophils Relative %: 54 %
Platelet Count: 149 10*3/uL — ABNORMAL LOW (ref 150–400)
RBC: 3.25 MIL/uL — ABNORMAL LOW (ref 3.87–5.11)
RDW: 14.7 % (ref 11.5–15.5)
WBC Count: 2.6 10*3/uL — ABNORMAL LOW (ref 4.0–10.5)
nRBC: 0 % (ref 0.0–0.2)

## 2019-12-28 LAB — CMP (CANCER CENTER ONLY)
ALT: 16 U/L (ref 0–44)
AST: 13 U/L — ABNORMAL LOW (ref 15–41)
Albumin: 4 g/dL (ref 3.5–5.0)
Alkaline Phosphatase: 71 U/L (ref 38–126)
Anion gap: 7 (ref 5–15)
BUN: 16 mg/dL (ref 6–20)
CO2: 26 mmol/L (ref 22–32)
Calcium: 9.3 mg/dL (ref 8.9–10.3)
Chloride: 107 mmol/L (ref 98–111)
Creatinine: 0.84 mg/dL (ref 0.44–1.00)
GFR, Est AFR Am: >60 mL/min (ref >60)
GFR, Estimated: >60 mL/min (ref >60)
Glucose, Bld: 81 mg/dL (ref 70–99)
Potassium: 4.1 mmol/L (ref 3.5–5.1)
Sodium: 140 mmol/L (ref 135–145)
Total Bilirubin: 0.4 mg/dL (ref 0.3–1.2)
Total Protein: 6.9 g/dL (ref 6.5–8.1)

## 2019-12-28 MED ORDER — GOSERELIN ACETATE 3.6 MG ~~LOC~~ IMPL
3.6000 mg | DRUG_IMPLANT | Freq: Once | SUBCUTANEOUS | Status: AC
  Administered 2019-12-28: 3.6 mg via SUBCUTANEOUS

## 2019-12-28 MED ORDER — GOSERELIN ACETATE 3.6 MG ~~LOC~~ IMPL
DRUG_IMPLANT | SUBCUTANEOUS | Status: AC
  Filled 2019-12-28: qty 3.6

## 2019-12-28 NOTE — Progress Notes (Signed)
pts bractlet would not scan. Got additional bractlet from front and also wouldn't scan because barcode was smeared. Called front and let them know of issue. Click barcode unreadable.

## 2019-12-28 NOTE — Patient Instructions (Signed)

## 2019-12-29 ENCOUNTER — Telehealth: Payer: Self-pay

## 2020-01-05 NOTE — Telephone Encounter (Signed)
error 

## 2020-01-25 ENCOUNTER — Inpatient Hospital Stay: Payer: BC Managed Care – PPO | Attending: Hematology

## 2020-01-25 ENCOUNTER — Other Ambulatory Visit: Payer: Self-pay

## 2020-01-25 ENCOUNTER — Inpatient Hospital Stay (HOSPITAL_BASED_OUTPATIENT_CLINIC_OR_DEPARTMENT_OTHER): Payer: BC Managed Care – PPO | Admitting: Nurse Practitioner

## 2020-01-25 ENCOUNTER — Ambulatory Visit: Payer: BC Managed Care – PPO | Admitting: Hematology

## 2020-01-25 ENCOUNTER — Encounter: Payer: Self-pay | Admitting: Nurse Practitioner

## 2020-01-25 ENCOUNTER — Other Ambulatory Visit: Payer: BC Managed Care – PPO

## 2020-01-25 ENCOUNTER — Inpatient Hospital Stay: Payer: BC Managed Care – PPO

## 2020-01-25 ENCOUNTER — Ambulatory Visit: Payer: BC Managed Care – PPO

## 2020-01-25 VITALS — BP 104/65 | HR 57 | Temp 97.8°F | Resp 20 | Ht 69.0 in | Wt 139.5 lb

## 2020-01-25 DIAGNOSIS — D61810 Antineoplastic chemotherapy induced pancytopenia: Secondary | ICD-10-CM | POA: Insufficient documentation

## 2020-01-25 DIAGNOSIS — Z95828 Presence of other vascular implants and grafts: Secondary | ICD-10-CM

## 2020-01-25 DIAGNOSIS — Z5111 Encounter for antineoplastic chemotherapy: Secondary | ICD-10-CM | POA: Insufficient documentation

## 2020-01-25 DIAGNOSIS — C50811 Malignant neoplasm of overlapping sites of right female breast: Secondary | ICD-10-CM

## 2020-01-25 DIAGNOSIS — Z79811 Long term (current) use of aromatase inhibitors: Secondary | ICD-10-CM | POA: Insufficient documentation

## 2020-01-25 DIAGNOSIS — R911 Solitary pulmonary nodule: Secondary | ICD-10-CM

## 2020-01-25 DIAGNOSIS — Z170 Estrogen receptor positive status [ER+]: Secondary | ICD-10-CM

## 2020-01-25 LAB — CMP (CANCER CENTER ONLY)
ALT: 23 U/L (ref 0–44)
AST: 12 U/L — ABNORMAL LOW (ref 15–41)
Albumin: 4.2 g/dL (ref 3.5–5.0)
Alkaline Phosphatase: 77 U/L (ref 38–126)
Anion gap: 6 (ref 5–15)
BUN: 15 mg/dL (ref 6–20)
CO2: 28 mmol/L (ref 22–32)
Calcium: 9.8 mg/dL (ref 8.9–10.3)
Chloride: 105 mmol/L (ref 98–111)
Creatinine: 0.91 mg/dL (ref 0.44–1.00)
GFR, Estimated: >60 mL/min (ref >60)
Glucose, Bld: 85 mg/dL (ref 70–99)
Potassium: 3.9 mmol/L (ref 3.5–5.1)
Sodium: 139 mmol/L (ref 135–145)
Total Bilirubin: <0.2 mg/dL — ABNORMAL LOW (ref 0.3–1.2)
Total Protein: 7.8 g/dL (ref 6.5–8.1)

## 2020-01-25 LAB — CBC WITH DIFFERENTIAL (CANCER CENTER ONLY)
Abs Immature Granulocytes: 0.01 10*3/uL (ref 0.00–0.07)
Basophils Absolute: 0.1 10*3/uL (ref 0.0–0.1)
Basophils Relative: 1 %
Eosinophils Absolute: 0.1 10*3/uL (ref 0.0–0.5)
Eosinophils Relative: 4 %
HCT: 32.7 % — ABNORMAL LOW (ref 36.0–46.0)
Hemoglobin: 10.3 g/dL — ABNORMAL LOW (ref 12.0–15.0)
Immature Granulocytes: 0 %
Lymphocytes Relative: 30 %
Lymphs Abs: 1 10*3/uL (ref 0.7–4.0)
MCH: 29.2 pg (ref 26.0–34.0)
MCHC: 31.5 g/dL (ref 30.0–36.0)
MCV: 92.6 fL (ref 80.0–100.0)
Monocytes Absolute: 0.3 10*3/uL (ref 0.1–1.0)
Monocytes Relative: 8 %
Neutro Abs: 2 10*3/uL (ref 1.7–7.7)
Neutrophils Relative %: 57 %
Platelet Count: 176 10*3/uL (ref 150–400)
RBC: 3.53 MIL/uL — ABNORMAL LOW (ref 3.87–5.11)
RDW: 14.2 % (ref 11.5–15.5)
WBC Count: 3.5 10*3/uL — ABNORMAL LOW (ref 4.0–10.5)
nRBC: 0 % (ref 0.0–0.2)

## 2020-01-25 MED ORDER — GOSERELIN ACETATE 3.6 MG ~~LOC~~ IMPL
3.6000 mg | DRUG_IMPLANT | Freq: Once | SUBCUTANEOUS | Status: AC
  Administered 2020-01-25: 3.6 mg via SUBCUTANEOUS

## 2020-01-25 MED ORDER — GOSERELIN ACETATE 3.6 MG ~~LOC~~ IMPL
DRUG_IMPLANT | SUBCUTANEOUS | Status: AC
  Filled 2020-01-25: qty 3.6

## 2020-01-25 NOTE — Progress Notes (Signed)
[place]   [phone] [phone]   Clinic Follow up Note   Patient Care Team: [name], MD as PCP - General (Family Medicine) [name], MD as Consulting Physician (General Surgery) [name], MD as Consulting Physician (Hematology) [name], MD as Consulting Physician (Radiation Oncology) [name], MD as Consulting Physician (Obstetrics and Gynecology) [name], RN as Oncology Nurse Navigator [name], RN as Oncology Nurse Navigator [name], NP as Nurse Practitioner (Nurse Practitioner) 01/25/2020  CHIEF COMPLAINT: Follow-up right breast cancer  SUMMARY OF ONCOLOGIC HISTORY: Oncology History Overview Note  Cancer Staging Cancer of overlapping sites of right female breast [facility]) Staging form: Breast, AJCC 8th Edition - Clinical stage from 05/28/2018: Stage IB (cT2, cN1, cM0, G3, ER+, PR+, HER2+) - Signed by [name], MD on 05/28/2018  Malignant neoplasm of lower-outer quadrant of right breast of female, estrogen receptor positive ([place]) Staging form: Breast, AJCC 8th Edition - Clinical stage from 05/28/2018: Stage IIB (cT2, cN1, cM0, G3, ER+, PR+, HER2-) - Unsigned     Cancer of overlapping sites of right female breast ([place])  05/14/2018 Mammogram   Right breast mammogram 05/14/18  IMPRESSION  There is a 1.0 x 0.5 x 1.2 cm lobular hypoechoic mass right breast 7 o'clock position 3 cm from nipple, a 0.8 x 0.6 x 1.2 cm lobular hypoechoic mass right breast 7 o'clock position 2 cm from the nipple, and a 0.9 x 0.5 x 0.8 cm lobular hypoechoic mass right breast 5 o'clock position 3 cm from nipple.  There is a 3.8 x 1.0 x 1.3 cm lobular hypoechoic mass right breast 5 o'clock position 2 cm from nipple.  Multiple thickened right axillary lymph nodes (approximately 15). Measuring up to 1.0 cm in thickness. Adenopathy corresponds with axillary palpable abnormality.  Indeterminate round and punctate  calcifications upper-outer quadrant right breast.Indeterminate round and punctate calcifications upper-outer quadrant right breast.   05/21/2018 Initial Biopsy   Diagnosis 05/21/18  1. Breast, right, needle core biopsy, axilla, 5 o'clock, ribbon clip - INVASIVE MAMMARY CARCINOMA. - MAMMARY CARCINOMA IN SITU. 2. Breast, right, needle core biopsy, 7 o'clock, coil clip - INVASIVE MAMMARY CARCINOMA. - MAMMARY CARCINOMA IN SITU. 3. Lymph node, needle/core biopsy, right axillary LN, hydromark - METASTATIC MAMMARY CARCINOMA.    05/21/2018 Receptors her2   1. PROGNOSTIC INDICATORS Results: The tumor cells are EQUIVOCAL for Her2 (2+);  HER2 **NEGATIVE** by FISH   Estrogen Receptor: 80%, POSITIVE, MODERATE STAINING INTENSITY Progesterone Receptor: 5%, POSITIVE, MODERATE-WEAK STAINING INTENSITY Proliferation Marker Ki67: 10%  3. PROGNOSTIC INDICATORS Results: The tumor cells are POSITIVE for Her2 (3+). Estrogen Receptor: 90%, POSITIVE, STRONG STAINING INTENSITY Progesterone Receptor: 50%, POSITIVE, MODERATE STAINING INTENSITY Proliferation Marker Ki67: 5%   05/26/2018 Initial Diagnosis   Malignant neoplasm of lower-inner quadrant of right breast of female, estrogen receptor positive ([place])   05/27/2018 Mammogram   Left breast mammogram 05/27/18  IMPRESSION: No mammographic evidence of LEFT breast malignancy.   05/28/2018 Cancer Staging   Staging form: Breast, AJCC 8th Edition - Clinical stage from 05/28/2018: Stage IB (cT2, cN1, cM0, G3, ER+, PR+, HER2+) - Signed by [name], MD on 05/28/2018   06/03/2018 Imaging   MR BREAST BILATERAL W WO CONTRAST INC CAD  IMPRESSION: 1. Large enhancing masses and non mass enhancement identified throughout the majority of the right breast. Additionally, there are smaller masses identified within the upper inner and lower inner right breast. Overall findings are concerning for extensive malignancy throughout all  4 quadrants of the right breast. 2.  Extensive bulky lymphadenopathy identified within the right axilla. The matted appearance makes counting the number of abnormal lymph nodes difficult as they are immediately approximated to each other. 3. At least one abnormal appearing right internal mammary lymph node. 4. Nonenhancing lesion within the sternum with associated sternal marrow heterogeneity, raising the possibility of osseous metastatic disease within the sternum. 5. Two enhancing masses within the left breast as described above.     06/05/2018 PET scan   NM PET Image Initial (PI) Skull Base To Thigh  IMPRESSION: 1. Multiple borderline enlarged right axillary lymph nodes identified exhibiting mild to moderate increased uptake. Can not rule out nodal metastasis. Correlation with biopsy recommended. 2. Asymmetric increased uptake within the right breast corresponding to MRI abnormality. 3. Subcentimeter right retropectoral and right internal mammary lymph nodes are again identified and exhibit nonspecific, low level FDG uptake. 4. Noncalcified solid nodule within the lateral right lower lobe measures 9 mm without significant radiotracer uptake. 5. No hypermetabolic osseous lesions identified.     06/09/2018 - 11/03/2018 Chemotherapy   Neoadjuvant chemotherapy TCHP, 06/09/2018-09/22/18. Followed by Maintenance Herceptin/Perjeta q3weeks starting 10/14/18 to continue 1 year treatment. Stopped Herceptin/Perjeta on 11/03/18 due to multiple Positive LN of pathology.    06/10/2018 Pathology Results   Surgical pathology  Diagnosis 1. Breast, left, needle core biopsy, LOQ, barbell - MILD FIBROCYSTIC CHANGES. 2. Breast, left, needle core biopsy, UIQ cylinder - MILD FIBROCYSTIC CHANGES.    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 There were no deleterious  mutations in AIP, ALK, APC, ATM, AXIN2,BAP1,  BARD1, BLM, BMPR1A, BRCA1, BRCA2, BRIP1, CASR, CDC73, CDH1, CDK4, CDKN1B, CDKN1C, CDKN2A (p14ARF), CDKN2A (p16INK4a), CEBPA, CHEK2, CTNNA1, DICER1, DIS3L2, EGFR (c.2369C>T, p.Thr790Met variant only), EPCAM (Deletion/duplication testing only), FH, FLCN, GATA2, GPC3, GREM1 (Promoter region deletion/duplication testing only), HOXB13 (c.251G>A, p.Gly84Glu), HRAS, KIT, MAX, MEN1, MET, MITF (c.952G>A, p.Glu318Lys variant only), MLH1, MSH2, MSH3, MSH6, MUTYH, NBN, NF1, NF2, NTHL1, PDGFRA, PHOX2B, PMS2, POLD1, POLE, POT1, PRKAR1A, PTCH1, PTEN, RAD50, RAD51C, RAD51D, RB1, RECQL4, RET, RUNX1, SDHAF2, SDHA (sequence changes only), SDHB, SDHC, SDHD, SMAD4, SMARCA4, SMARCB1, SMARCE1, STK11, SUFU, TERC, TERT, TMEM127, TP53, TSC1, TSC2, VHL, WRN and WT1.  .   09/24/2018 Breast MRI   Breast MRI from [facility] on 09/24/18 Right breast: Signal void inferior right breast, posterior depth from biopsy marker clip. Interval decreased size of the right breast, and now symmetric to the left, compared to prior MRI. Resolution of abnormal nonmass enhancement without evidence of residual enhancing mass. No evidence of axillary or internal mammary lymphadenopathy is seen, markedly improved from prior. There is no abnormal skin, nipple, or pectoralis muscle enhancement.  Left breast: There are no suspicious enhancing masses or areas of non-mass enhancement. No evidence of axillary or internal mammary lymphadenopathy is seen. There is no abnormal skin, nipple, or pectoralis muscle enhancement.   10/09/2018 Imaging   CT chest W Contrast 10/09/18  IMPRESSION: 1. Stable 10 mm right lower lobe pulmonary nodule since 06/05/2018. Nodule is not substantially hypermetabolic on the PET-CT. Continued attention on follow-up recommended. 2. No findings to suggest metastatic disease in the thorax.   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 surrounding tissue - Biopsy clips identified - Margin status (see extended anterior margin below)  Invasive carcinoma: Positive anterior-inferior margin  Ductal carcinoma in situ: Positive anterior-inferior margin - Ancillary studies previously reported on [id number](5:00, ribbon clip) Estrogen receptor: Positive (80%) Progesterone receptor: Positive (10%) HER2 IHC: Equivocal (2+) HER2 FISH: Negative HER2/CEP17 ratio: 1.35 Mean HER2 copy number: 1.55 - Ancillary studies previously reported on [id number] (right axillary lymph node) Estrogen receptor: Positive (90%) Progesterone receptor: Positive (20%) HER2 IHC: Positive (3+) - Residual Cancer Burden  Tumor bed size: 41 mm x 20 mm Overall tumor cellularity: 10% Percentage in situ: 60% Number of involved lymph nodes: 7 Diameter of largest metastasis: 6 mm Residual Cancer Burden Score: 3.108 Residual Cancer Burden Class: RCB-II - Other findings: Atypical lobular hyperplasia and columnar cell change with microcalcifications; fibroadenomas with microcalcifications  B: Right axilla, targeted lymph node excision - Six of seven lymph nodes positive for carcinoma (6/7) - Size of largest tumor deposit: 6 mm - Biopsy site identified - Treatment effect: Present - Extracapsular extension: Absent  C: Right axilla, lymph node dissection - One of seven lymph nodes positive for carcinoma, macrometastasis, 6 mm (1/7) - Treatment effect: Absent - Extracapsular extension: Absent  D: Right breast, nipple core biopsy - Negative for carcinoma  E: Breast, right, anterior margin, excision - Microscopic foci of ductal carcinoma in situ - Margin: Negative, <1 mm    11/05/2018 Cancer  Staging   Staging form: Breast, AJCC 8th Edition - Pathologic stage from 11/05/2018: No Stage Recommended (ypT1c, pN2a, cM0, G2, ER+, PR+, HER2-) - Signed by [name], MD on 11/23/2018   11/24/2018 - 08/24/2019 Chemotherapy   Due to positive LN on surgical pathology her maintance Herceptin/Perjeta was changed to [place] for about 14 cycles on 11/24/18. Completed on 08/24/19   12/24/2018 - 02/03/2019 Radiation Therapy   adjuvant radiation at [facility] through [facility] on 12/24/18 and completed on 02/03/19.    01/23/2019 Imaging   CT Chest wo  IMPRESSION: 1. Stable right lower lobe pulmonary nodule. 2. No signs of new or suspicious finding since study of 10/09/2018, now status post right mastectomy, axillary dissection and breast reconstruction.   02/16/2019 -  Anti-estrogen oral therapy   -Monthly Zoladex injection started 12/19/18 -Letrozole 2.5 mg started 02/16/19    04/20/2019 Survivorship   SCP delivered by Cira Rue, NP    09/22/2019 -  Chemotherapy   Nerlynx starting at 4 tablets (160 mg total) by mouth daily for 7 days, THEN 5 tablets (200 mg total) daily for 7 days, THEN 6 tablets (240 mg total) daily for 16 days, starting on 09/22/19.      CURRENT THERAPY:  -Monthly Zoladex injection started 12/19/18 -Letrozole 2.5 mg started 02/16/19 -Nerlynx startingon 6/8/21titrated up to 246m over 3 weeks.  INTERVAL HISTORY: Ms. BCriscionereturns for follow-up as scheduled.  She was last seen by me on 11/30/2019.  [facility] recently found to have left breast cellulitis requiring hospital admission for broad-spectrum IV antibiotics, left breast erythema improved, she was transitioned to p.o. antibiotics (Cipro and Bactrim) at discharge on 01/19/2020.  Ms. BBrunkereturns for herself today.  She is doing well in general.  She continues antibiotics, left breast erythema nearly gone. She has been more fatigued since hospitalization which she  attributes  partly to her busy schedule.  She remains completely functional.  She continues letrozole and Nerlynx.  She has constipation with occasional "breakthrough" diarrhea, does not require Imodium.  She has mild nausea mostly at bedtime, controlled with Compazine.  Not limiting p.o. intake.  Hot flashes are stable, improved on Zoloft.  Denies any new fever, chills, cough, chest pain, dyspnea, bone pain, or new concerns.    MEDICAL HISTORY:  Past Medical History:  Diagnosis Date  . Acid reflux   . Acute bronchitis   . Anemia   . Asthma   . Family history of brain cancer   . Family history of breast cancer   . Family history of prostate cancer   . Migraine     SURGICAL HISTORY: Past Surgical History:  Procedure Laterality Date  . BREAST BIOPSY Right 05/23/2018   x3, malignant  . DILATION AND CURETTAGE OF UTERUS     2006 x1, 2009 x2  . LAPAROTOMY  2008  . PORTACATH PLACEMENT Left 06/06/2018   Procedure: INSERTION PORT-A-CATH POSSIBLE ULTRASOUND;  Surgeon: [name], MD;  Location: [facility];  Service: General;  Laterality: Left;    I have reviewed the social history and family history with the patient and they are unchanged from previous note.  ALLERGIES:  has No Known Allergies.  MEDICATIONS:  Current Outpatient Medications  Medication Sig Dispense Refill  . albuterol (PROVENTIL HFA;VENTOLIN HFA) 108 (90 BASE) MCG/ACT inhaler Inhale 2 puffs into the lungs every 6 (six) hours as needed for wheezing or shortness of breath.    . ALPRAZolam (XANAX) 0.5 MG tablet Take 0.25-0.5 mg by mouth at bedtime as needed for anxiety.     Haze Justin Probiotic (BIOGAIA PROBIOTIC) LIQD Take by mouth daily at 8 pm.    . Cholecalciferol (VITAMIN D) 50 MCG (2000 UT) CAPS Take 2,000 Units by mouth daily.     . ciprofloxacin (CIPRO) 500 MG tablet Take by mouth.    Marland Kitchen ibuprofen (ADVIL) 200 MG tablet Take 400 mg by mouth every 6 (six) hours as needed for headache or moderate pain.    Marland Kitchen  letrozole (FEMARA) 2.5 MG tablet TAKE 1 TABLET BY MOUTH EVERY DAY 90 tablet 1  . Melatonin 3 MG CAPS Take 3 mg by mouth at bedtime.    . Neratinib Maleate (NERLYNX) 40 MG tablet Take 6 tablets (240 mg total) by mouth daily. Take with food. 180 tablet 3  . Polyethylene Glycol 3350 (MIRALAX PO) Take 17 g by mouth daily as needed.     . propranolol (INDERAL) 10 MG tablet Take 5 mg by mouth daily as needed (for shaking hands).     . sertraline (ZOLOFT) 100 MG tablet Take 1 tablet (100 mg total) by mouth daily. 90 tablet 2  . sulfamethoxazole-trimethoprim (BACTRIM DS) 800-160 MG tablet Take by mouth.    . zolpidem (AMBIEN) 5 MG tablet Take 5 mg by mouth at bedtime as needed.    . Budesonide ER 9 MG TB24 TAKE 1 TABLET BY MOUTH EVERY DAY 30 tablet 1  . diphenhydrAMINE (BENADRYL) 25 mg capsule Take 25 mg by mouth at bedtime as needed for sleep.    . Neratinib Maleate (NERLYNX) 40 MG tablet TAKE 4 TABLETS BY MOUTH DAILY FOR 7 DAYS, THEN 5 TABLETS DAILY FOR 7 DAYS, THEN 6 TABLETS DAILY FOR 16 DAYS. TAKE WITH FOOD. INCREASE AS TOLERATED 180 tablet 1   No current facility-administered medications for this visit.    PHYSICAL EXAMINATION:  ECOG PERFORMANCE STATUS: 0 - Asymptomatic  Vitals:   01/25/20 1418  BP: 104/65  Pulse: (!) 57  Resp: 20  Temp: 97.8 F (36.6 C)  SpO2: 100%   Filed Weights   01/25/20 1418  Weight: 139 lb 8 oz (63.3 kg)    GENERAL:alert, no distress and comfortable SKIN: No rash to exposed skin EYES: sclera clear NECK: Without mass LYMPH:  no palpable cervical, supraclavicular, or axillary lymphadenopathy LUNGS:  normal breathing effort HEART: regular rate & rhythm, no lower extremity edema NEURO: alert & oriented x 3 with fluent speech Breast exam: S/p bilateral mastectomy with reconstruction.  No palpable mass in either breast/chest wall or incision, or axilla that I could appreciate.  Faint erythema to left medial breast  LABORATORY DATA:  I have reviewed the data as  listed CBC Latest Ref Rng & Units 01/25/2020 12/28/2019 11/30/2019  WBC 4.0 - 10.5 K/uL 3.5(L) 2.6(L) 3.2(L)  Hemoglobin 12.0 - 15.0 g/dL 10.3(L) 9.5(L) 10.5(L)  Hematocrit 36 - 46 % 32.7(L) 29.9(L) 33.4(L)  Platelets 150 - 400 K/uL 176 149(L) 142(L)     CMP Latest Ref Rng & Units 01/25/2020 12/28/2019 11/30/2019  Glucose 70 - 99 mg/dL 85 81 60(L)  BUN 6 - 20 mg/dL _0 Creatinine 0.44 - 1.00 mg/dL 0.91 0.84 0.91  Sodium 135 - 145 mmol/L 139 140 138  Potassium 3.5 - 5.1 mmol/L 3.9 4.1 4.4  Chloride 98 - 111 mmol/L 105 107 105  CO2 22 - 32 mmol/L _1 Calcium 8.9 - 10.3 mg/dL 9.8 9.3 10.0  Total Protein 6.5 - 8.1 g/dL 7.8 6.9 7.2  Total Bilirubin 0.3 - 1.2 mg/dL <0.2(L) 0.4 0.4  Alkaline Phos 38 - 126 U/L 77 71 75  AST 15 - 41 U/L 12(L) 13(L) 16  ALT 0 - 44 U/L _2 RADIOGRAPHIC STUDIES: I have personally reviewed the radiological images as listed and agreed with the findings in the report. No results found.   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 She overall had good response to chemo as seen on Breast MRI. She also has 70m right lung nodule,which has beenstable. S/p right mastectomyand [facility]  -To reduce her risk of recurrenceDr. FBurr MedicochangedmaintenanceHerceptin/Perjeta to KJim Likebased on the [facility]clinical trial data.  She completed in 08/2019.  -To further reduce her risk of recurrence she started anti-HER-2 treatment with Nerlynx on 09/22/2019, tolerating full dose -S/p left prophylactic [facility]aste[facility] with bilateral [facility]  She recovered well but  unfortunately developed left breast cellulitis requiring hospitalization for IV antibiotics 10/1-10/521, recovering well now on oral Cipro and Bactrim -Given ER/PR positive disease, andAIbeingmore beneficial than Tamoxifeninher young age and high risk disease, she started ovarian suppression with Zoladex on 12/19/18 as she isstill prememopausal. -Still considering BSO in late 2021 but the date is not set, continue zoladex until BSO -tolerating zoladex and letrozole well with moderate but tolerable hot flashes.    2. 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 She has discovered a stronger  maternal family history of prostate and breast cancerin her 2nd and 3rd degree relatives. -We discussed the risk of future breast cancer from PALB86mtation, which depends on her family history. -Dr. FStormy Cardthe risk for other cancers in carriers for PALB2 mutations, including female breast cancer, prostate, ovarian and pancreatic cancer, although these risks have not yet been quantified. While there is an increased risk for other cancers, there are no current guidelines for screening for these cancers, and at this time NCCN does not suggest a discussion of risk reducing salpingo-oophorectomy. -Her mother, 748 was recently diagnosed with breast cancertriple negative, probably PALB2 mutation related. She will proceed with b/l mastectomy in 09/2018.Her Sister had an abnormal breast MRI and is undergoing biopsy. -S/p prophylactic left mastectomy on 8/10, BSO pending   4.Pancytopenia secondary to chemo/Kadcyla -Consider blood transfusion if Hg<7and Udenyca if ANC <1.5 -Neutropenia and thrombocytopenia resolved, mild anemia is stable.  -she has mild leukopenia and  stable anemia, recent infection might contribute. We discussed ruling out nutritional deficiencies which can cause cytopenias. She agrees   5. 9105mRLL lung nodule  -Her initial staging PET scan showed a 9 mm solid rounded nodule within the lateral right lower lobe, not hypermetabolic. Attempted lung biopsy was not successful. -stable on serial CT chest imaging, last done 06/2019. Will order next scan   Disposition Ms. BaQuizonppears stable.  She continues daily neratinib, letrozole, and monthly Zoladex.  She tolerates treatment well overall.  Breast exam shows resolving left cellulitis, otherwise benign.  No clinical concern for recurrence.  Reviewed her CBC and CMP from today, neutropenia and thrombocytopenia resolved.  She has mild leukopenia and anemia.  Will rule out nutritional deficiencies which can cause cytopenias.  She continues infection precautions.   She is recovering from recent left breast cellulitis, on Cipro and Bactrim.  Infection may be contributing to mild cytopenias as well.  Follow-up with plastic surgery on 10/15.   She will continue neratinib, letrozole, and Zoladex.  She is due for repeat CT chest to monitor the stable right lower lung nodule.  She will return for lab and injection in 4 weeks, then lab/follow-up/inj in 8 weeks.   Orders Placed This Encounter  Procedures  . CT Chest Wo Contrast    Standing Status:   Future    Standing Expiration Date:   01/24/2021    Order Specific Question:   Is patient pregnant?    Answer:   No    Order Specific Question:   [facility]    [facility]    Standing Status:   Standing    Number of Occurrences:   1    Standing Expiration Date:   01/24/2021  . Ferritin    Standing Status:   Standing    Number of Occurrences:   1    Standing Expiration Date:   01/24/2021  . Vitamin B12    Standing Status:   Standing    Number of Occurrences:   1    Standing Expiration Date:    01/24/2021  . Folate, Serum    Standing Status:   Standing    Number of Occurrences:   1    Standing Expiration Date:   01/24/2021  . Methylmalonic acid, serum    Standing Status:   Standing    Number of Occurrences:   1    Standing Expiration Date:   01/24/2021   All questions were answered. The patient knows  to call the clinic with any problems, questions or concerns. No barriers to learning were detected. Total encounter time was 30 minutes.      [name], NP 01/25/20

## 2020-01-25 NOTE — Patient Instructions (Signed)

## 2020-01-26 ENCOUNTER — Telehealth: Payer: Self-pay | Admitting: Hematology

## 2020-01-26 NOTE — Telephone Encounter (Signed)
Scheduled per 10/11 los. Unable to reach pt. Left voicemail with appt times and dates.

## 2020-02-22 ENCOUNTER — Inpatient Hospital Stay: Payer: BC Managed Care – PPO

## 2020-02-22 ENCOUNTER — Other Ambulatory Visit: Payer: Self-pay

## 2020-02-22 ENCOUNTER — Ambulatory Visit: Payer: BC Managed Care – PPO

## 2020-02-22 ENCOUNTER — Other Ambulatory Visit: Payer: BC Managed Care – PPO

## 2020-02-22 ENCOUNTER — Inpatient Hospital Stay: Payer: BC Managed Care – PPO | Attending: Hematology

## 2020-02-22 DIAGNOSIS — R911 Solitary pulmonary nodule: Secondary | ICD-10-CM | POA: Insufficient documentation

## 2020-02-22 DIAGNOSIS — D649 Anemia, unspecified: Secondary | ICD-10-CM | POA: Insufficient documentation

## 2020-02-22 DIAGNOSIS — Z170 Estrogen receptor positive status [ER+]: Secondary | ICD-10-CM | POA: Diagnosis not present

## 2020-02-22 DIAGNOSIS — Z79811 Long term (current) use of aromatase inhibitors: Secondary | ICD-10-CM | POA: Diagnosis not present

## 2020-02-22 DIAGNOSIS — Z79899 Other long term (current) drug therapy: Secondary | ICD-10-CM | POA: Insufficient documentation

## 2020-02-22 DIAGNOSIS — C50811 Malignant neoplasm of overlapping sites of right female breast: Secondary | ICD-10-CM

## 2020-02-22 DIAGNOSIS — D72819 Decreased white blood cell count, unspecified: Secondary | ICD-10-CM | POA: Diagnosis not present

## 2020-02-22 DIAGNOSIS — D6481 Anemia due to antineoplastic chemotherapy: Secondary | ICD-10-CM | POA: Diagnosis not present

## 2020-02-22 DIAGNOSIS — Z95828 Presence of other vascular implants and grafts: Secondary | ICD-10-CM

## 2020-02-22 DIAGNOSIS — Z5111 Encounter for antineoplastic chemotherapy: Secondary | ICD-10-CM | POA: Diagnosis not present

## 2020-02-22 DIAGNOSIS — Z8616 Personal history of COVID-19: Secondary | ICD-10-CM | POA: Diagnosis not present

## 2020-02-22 LAB — CMP (CANCER CENTER ONLY)
ALT: 25 U/L (ref 0–44)
AST: 19 U/L (ref 15–41)
Albumin: 4.4 g/dL (ref 3.5–5.0)
Alkaline Phosphatase: 98 U/L (ref 38–126)
Anion gap: 8 (ref 5–15)
BUN: 12 mg/dL (ref 6–20)
CO2: 28 mmol/L (ref 22–32)
Calcium: 9.5 mg/dL (ref 8.9–10.3)
Chloride: 109 mmol/L (ref 98–111)
Creatinine: 0.85 mg/dL (ref 0.44–1.00)
GFR, Estimated: >60 mL/min (ref >60)
Glucose, Bld: 102 mg/dL — ABNORMAL HIGH (ref 70–99)
Potassium: 4.5 mmol/L (ref 3.5–5.1)
Sodium: 145 mmol/L (ref 135–145)
Total Bilirubin: 0.2 mg/dL — ABNORMAL LOW (ref 0.3–1.2)
Total Protein: 7.6 g/dL (ref 6.5–8.1)

## 2020-02-22 LAB — CBC WITH DIFFERENTIAL (CANCER CENTER ONLY)
Abs Immature Granulocytes: 0 10*3/uL (ref 0.00–0.07)
Basophils Absolute: 0 10*3/uL (ref 0.0–0.1)
Basophils Relative: 1 %
Eosinophils Absolute: 0.2 10*3/uL (ref 0.0–0.5)
Eosinophils Relative: 5 %
HCT: 34.9 % — ABNORMAL LOW (ref 36.0–46.0)
Hemoglobin: 10.9 g/dL — ABNORMAL LOW (ref 12.0–15.0)
Immature Granulocytes: 0 %
Lymphocytes Relative: 34 %
Lymphs Abs: 1.4 10*3/uL (ref 0.7–4.0)
MCH: 28.6 pg (ref 26.0–34.0)
MCHC: 31.2 g/dL (ref 30.0–36.0)
MCV: 91.6 fL (ref 80.0–100.0)
Monocytes Absolute: 0.4 10*3/uL (ref 0.1–1.0)
Monocytes Relative: 9 %
Neutro Abs: 2.2 10*3/uL (ref 1.7–7.7)
Neutrophils Relative %: 51 %
Platelet Count: 174 10*3/uL (ref 150–400)
RBC: 3.81 MIL/uL — ABNORMAL LOW (ref 3.87–5.11)
RDW: 14.5 % (ref 11.5–15.5)
WBC Count: 4.2 10*3/uL (ref 4.0–10.5)
nRBC: 0 % (ref 0.0–0.2)

## 2020-02-22 LAB — VITAMIN B12: Vitamin B-12: 264 pg/mL (ref 180–914)

## 2020-02-22 LAB — FOLATE: Folate: 11 ng/mL (ref >5.9)

## 2020-02-22 MED ORDER — GOSERELIN ACETATE 3.6 MG ~~LOC~~ IMPL
3.6000 mg | DRUG_IMPLANT | Freq: Once | SUBCUTANEOUS | Status: AC
  Administered 2020-02-22: 3.6 mg via SUBCUTANEOUS

## 2020-02-22 MED ORDER — GOSERELIN ACETATE 3.6 MG ~~LOC~~ IMPL
DRUG_IMPLANT | SUBCUTANEOUS | Status: AC
  Filled 2020-02-22: qty 3.6

## 2020-02-22 NOTE — Patient Instructions (Signed)

## 2020-02-23 ENCOUNTER — Other Ambulatory Visit: Payer: Self-pay | Admitting: Hematology

## 2020-02-23 LAB — IRON AND TIBC
Iron: 27 ug/dL — ABNORMAL LOW (ref 41–142)
Saturation Ratios: 7 % — ABNORMAL LOW (ref 21–57)
TIBC: 386 ug/dL (ref 236–444)
UIBC: 359 ug/dL (ref 120–384)

## 2020-02-23 LAB — FERRITIN: Ferritin: <4 ng/mL — ABNORMAL LOW (ref 11–307)

## 2020-02-24 ENCOUNTER — Encounter: Payer: Self-pay | Admitting: Nurse Practitioner

## 2020-02-25 ENCOUNTER — Other Ambulatory Visit: Payer: Self-pay | Admitting: Nurse Practitioner

## 2020-02-25 NOTE — Progress Notes (Signed)
I called Kim [name] to review her labs.  B12 is low-normal range, and she has iron deficiency, ferritin less than 4.  She is asymptomatic, exertional dyspnea and fatigue.  She has already started oral iron and B12.  Discussed IV iron formulations, and the risk and benefit including allergic reaction.  I recommend IV Venofer 400 mg for a total of 3 doses.  She agrees to proceed.  We will get insurance approval and scheduling done ASAP.  Cira Rue, NP

## 2020-02-26 ENCOUNTER — Telehealth: Payer: Self-pay | Admitting: Nurse Practitioner

## 2020-02-26 LAB — METHYLMALONIC ACID, SERUM: Methylmalonic Acid, Quantitative: 134 nmol/L (ref 0–378)

## 2020-02-26 NOTE — Telephone Encounter (Signed)
Scheduled per 11/11 staff message. Pt is aware of appt time and date on 11/15. Waiting to add 2 more cycles and will call pt back about appts.

## 2020-02-29 ENCOUNTER — Inpatient Hospital Stay: Payer: BC Managed Care – PPO

## 2020-02-29 ENCOUNTER — Other Ambulatory Visit: Payer: Self-pay

## 2020-02-29 VITALS — BP 103/65 | HR 52 | Temp 98.8°F | Resp 18

## 2020-02-29 DIAGNOSIS — Z5111 Encounter for antineoplastic chemotherapy: Secondary | ICD-10-CM | POA: Diagnosis not present

## 2020-02-29 DIAGNOSIS — Z95828 Presence of other vascular implants and grafts: Secondary | ICD-10-CM

## 2020-02-29 MED ORDER — SODIUM CHLORIDE 0.9 % IV SOLN
400.0000 mg | Freq: Once | INTRAVENOUS | Status: AC
  Administered 2020-02-29: 400 mg via INTRAVENOUS
  Filled 2020-02-29: qty 20

## 2020-02-29 MED ORDER — SODIUM CHLORIDE 0.9 % IV SOLN
Freq: Once | INTRAVENOUS | Status: AC
  Filled 2020-02-29: qty 250

## 2020-02-29 NOTE — Patient Instructions (Signed)

## 2020-03-03 ENCOUNTER — Encounter: Payer: Self-pay | Admitting: Hematology

## 2020-03-03 ENCOUNTER — Other Ambulatory Visit: Payer: Self-pay | Admitting: Obstetrics

## 2020-03-03 ENCOUNTER — Inpatient Hospital Stay: Payer: BC Managed Care – PPO

## 2020-03-03 ENCOUNTER — Other Ambulatory Visit: Payer: Self-pay

## 2020-03-03 VITALS — BP 110/64 | HR 48 | Temp 98.8°F | Resp 17

## 2020-03-03 DIAGNOSIS — Z5111 Encounter for antineoplastic chemotherapy: Secondary | ICD-10-CM | POA: Diagnosis not present

## 2020-03-03 DIAGNOSIS — Z95828 Presence of other vascular implants and grafts: Secondary | ICD-10-CM

## 2020-03-03 MED ORDER — SODIUM CHLORIDE 0.9 % IV SOLN
400.0000 mg | Freq: Once | INTRAVENOUS | Status: AC
  Administered 2020-03-03: 400 mg via INTRAVENOUS
  Filled 2020-03-03: qty 20

## 2020-03-03 MED ORDER — SODIUM CHLORIDE 0.9 % IV SOLN
Freq: Once | INTRAVENOUS | Status: AC
  Filled 2020-03-03: qty 250

## 2020-03-03 NOTE — Patient Instructions (Signed)

## 2020-03-07 ENCOUNTER — Inpatient Hospital Stay: Payer: BC Managed Care – PPO

## 2020-03-07 ENCOUNTER — Other Ambulatory Visit: Payer: Self-pay

## 2020-03-07 VITALS — BP 111/78 | HR 52 | Temp 98.0°F | Resp 18

## 2020-03-07 DIAGNOSIS — C50811 Malignant neoplasm of overlapping sites of right female breast: Secondary | ICD-10-CM

## 2020-03-07 DIAGNOSIS — Z5111 Encounter for antineoplastic chemotherapy: Secondary | ICD-10-CM | POA: Diagnosis not present

## 2020-03-07 DIAGNOSIS — Z95828 Presence of other vascular implants and grafts: Secondary | ICD-10-CM

## 2020-03-07 DIAGNOSIS — Z170 Estrogen receptor positive status [ER+]: Secondary | ICD-10-CM

## 2020-03-07 MED ORDER — SODIUM CHLORIDE 0.9 % IV SOLN
INTRAVENOUS | Status: DC
  Filled 2020-03-07: qty 250

## 2020-03-07 MED ORDER — SODIUM CHLORIDE 0.9 % IV SOLN
400.0000 mg | Freq: Once | INTRAVENOUS | Status: AC
  Administered 2020-03-07: 400 mg via INTRAVENOUS
  Filled 2020-03-07: qty 20

## 2020-03-07 NOTE — Patient Instructions (Signed)

## 2020-03-08 NOTE — Patient Instructions (Addendum)
YOU ARE SCHEDULED FOR A COVID TEST _12/2/21________@_8 :15___________. THIS TEST MUST BE DONE BEFORE SURGERY. GO TO  [place]. [place], [place], IT IS APPROXIMATELY 2 MINUTES PAST [employer] ON THE RIGHT AND REMAIN IN YOUR CAR, THIS IS A DRIVE UP TEST. ONCE YOUR COVID TEST IS DONE PLEASE FOLLOW ALL THE QUARANTINE  INSTRUCTIONS GIVEN IN YOUR HANDOUT.      Your procedure is scheduled on 03/21/20   Report to [place]. M.   Call this number if you have problems the morning of surgery  :[phone].   OUR ADDRESS IS [address]   WE ARE LOCATED IN [facility].  PLEASE BRING YOUR INSURANCE CARD AND PHOTO ID DAY OF SURGERY.  ONLY ONE PERSON ALLOWED IN FACILITY WAITING AREA.                                     REMEMBER:  DO NOT EAT FOOD OR DRINK LIQUIDS AFTER MIDNIGHT .   YOU MAY  BRUSH YOUR TEETH MORNING OF SURGERY AND RINSE YOUR MOUTH OUT, NO CHEWING GUM CANDY OR MINTS.   TAKE THESE MEDICATIONS MORNING OF SURGERY WITH A SIP OF WATER:  Zoloft. Use your inhaler and bring it with you to the hospital.  [place] OF SURGERY.  NO VISITOR MAY SPEND THE NIGHT.   VISITOR ARE ALLOWED TO STAY UNTIL 8:00 PM.                                    DO NOT WEAR JEWERLY, MAKE UP, OR NAIL POLISH ON FINGERNAILS.  DO NOT WEAR LOTIONS, POWDERS, PERFUMES OR DEODORANT.  DO NOT SHAVE FOR 24 HOURS PRIOR TO DAY OF SURGERY.   CONTACTS, GLASSES, OR DENTURES MAY NOT BE WORN TO SURGERY.                                    [facility] IS NOT RESPONSIBLE  FOR ANY BELONGINGS.                                                                    Marland Kitchen                                                                                                    [facility] - Preparing for Surgery  Before surgery, you can play an important role.   Because skin is not sterile, your skin needs to be as free of germs as  possible .  You can reduce the number  of germs on your skin by washing with CHG (chlorahexidine gluconate) soap before surgery.   CHG is an antiseptic cleaner which kills germs and bonds with the skin to continue killing germs even after washing. Please DO NOT use if you have an allergy to CHG or antibacterial soaps.   If your skin becomes reddened/irritated stop using the CHG and inform your nurse when you arrive at Short Stay. Do not shave (including legs and underarms) for at least 48 hours prior to the first CHG shower.    Please follow these instructions carefully:  1.  Shower with CHG Soap the night before surgery and the  morning of Surgery.  2.  If you choose to wash your hair, wash your hair first as usual with your  normal  shampoo.  3.  After you shampoo, rinse your hair and body thoroughly to remove the  shampoo.                                        4.  Use CHG as you would any other liquid soap.  You can apply chg directly  to the skin and wash                       Gently with a scrungie or clean washcloth.  5.  Apply the CHG Soap to your body ONLY FROM THE NECK DOWN.   Do not use on face/ open                           Wound or open sores. Avoid contact with eyes, ears mouth and genitals (private parts).                       Wash face,  Genitals (private parts) with your normal soap.             6.  Wash thoroughly, paying special attention to the area where your surgery  will be performed.  7.  Thoroughly rinse your body with warm water from the neck down.  8.  DO NOT shower/wash with your normal soap after using and rinsing off  the CHG Soap.             9.  Pat yourself dry with a clean towel.            10.  Wear clean pajamas.            11.  Place clean sheets on your bed the night of your first shower and do not  sleep with pets. Day of Surgery : Do not apply any lotions/deodorants the morning of surgery.  Please wear clean clothes to the hospital/surgery  center.  FAILURE TO FOLLOW THESE INSTRUCTIONS MAY RESULT IN THE CANCELLATION OF YOUR SURGERY PATIENT SIGNATURE_________________________________  NURSE SIGNATURE__________________________________  ________________________________________________________________________

## 2020-03-09 ENCOUNTER — Encounter (HOSPITAL_COMMUNITY)
Admission: RE | Admit: 2020-03-09 | Discharge: 2020-03-09 | Disposition: A | Discharge status: Home / Self Care | Payer: BC Managed Care – PPO | Source: Ambulatory Visit | Attending: Obstetrics | Admitting: Obstetrics

## 2020-03-09 ENCOUNTER — Encounter (HOSPITAL_COMMUNITY): Payer: Self-pay

## 2020-03-09 ENCOUNTER — Other Ambulatory Visit: Payer: Self-pay

## 2020-03-09 DIAGNOSIS — Z01812 Encounter for preprocedural laboratory examination: Secondary | ICD-10-CM | POA: Insufficient documentation

## 2020-03-09 HISTORY — DX: Depression, unspecified: F32.A

## 2020-03-09 HISTORY — DX: Anxiety disorder, unspecified: F41.9

## 2020-03-09 HISTORY — DX: Malignant (primary) neoplasm, unspecified: C80.1

## 2020-03-09 LAB — CBC
HCT: 33.8 % — ABNORMAL LOW (ref 36.0–46.0)
Hemoglobin: 10.7 g/dL — ABNORMAL LOW (ref 12.0–15.0)
MCH: 29.2 pg (ref 26.0–34.0)
MCHC: 31.7 g/dL (ref 30.0–36.0)
MCV: 92.3 fL (ref 80.0–100.0)
Platelets: 143 10*3/uL — ABNORMAL LOW (ref 150–400)
RBC: 3.66 MIL/uL — ABNORMAL LOW (ref 3.87–5.11)
RDW: 15.4 % (ref 11.5–15.5)
WBC: 3.4 10*3/uL — ABNORMAL LOW (ref 4.0–10.5)
nRBC: 0 % (ref 0.0–0.2)

## 2020-03-09 LAB — TYPE AND SCREEN
ABO/RH(D): O POS
Antibody Screen: NEGATIVE

## 2020-03-09 LAB — BASIC METABOLIC PANEL
Anion gap: 8 (ref 5–15)
BUN: 15 mg/dL (ref 6–20)
CO2: 28 mmol/L (ref 22–32)
Calcium: 9.3 mg/dL (ref 8.9–10.3)
Chloride: 105 mmol/L (ref 98–111)
Creatinine, Ser: 0.7 mg/dL (ref 0.44–1.00)
GFR, Estimated: >60 mL/min (ref >60)
Glucose, Bld: 105 mg/dL — ABNORMAL HIGH (ref 70–99)
Potassium: 4.3 mmol/L (ref 3.5–5.1)
Sodium: 141 mmol/L (ref 135–145)

## 2020-03-09 NOTE — Progress Notes (Signed)
COVID Vaccine Completed:yes Date COVID Vaccine completed:07/23/19 Booster  12/18/19 COVID vaccine manufacturer: Pfizer     PCP - Dr. Beckie Salts Cardiologist - none  Chest x-ray - 07/10/19 EKG - 03/18/19-Epic Stress Test - no ECHO - 06/24/19- Epic Cardiac Cath - no Pacemaker/ICD device last checked:NA  Sleep Study - no CPAP -   Fasting Blood Sugar - NA Checks Blood Sugar _____ times a day  Blood Thinner Instructions:NA Aspirin Instructions: Last Dose:  Anesthesia review:   Patient denies shortness of breath, fever, cough and chest pain at PAT appointment yes   Patient verbalized understanding of instructions that were given to them at the PAT appointment. Patient was also instructed that they will need to review over the PAT instructions again at home before surgery. Yes Pt just finished chemo and port a cath was removed 11/24/19. She has been on iron infusions.

## 2020-03-17 ENCOUNTER — Other Ambulatory Visit (HOSPITAL_COMMUNITY)
Admission: RE | Admit: 2020-03-17 | Discharge: 2020-03-17 | Disposition: A | Discharge status: Home / Self Care | Payer: BC Managed Care – PPO | Source: Ambulatory Visit | Attending: Obstetrics | Admitting: Obstetrics

## 2020-03-17 DIAGNOSIS — Z01812 Encounter for preprocedural laboratory examination: Secondary | ICD-10-CM | POA: Diagnosis not present

## 2020-03-17 DIAGNOSIS — Z20822 Contact with and (suspected) exposure to covid-19: Secondary | ICD-10-CM | POA: Diagnosis not present

## 2020-03-17 LAB — SARS CORONAVIRUS 2 (TAT 6-24 HRS): SARS Coronavirus 2: NEGATIVE

## 2020-03-21 ENCOUNTER — Inpatient Hospital Stay: Payer: BC Managed Care – PPO

## 2020-03-21 ENCOUNTER — Encounter (HOSPITAL_BASED_OUTPATIENT_CLINIC_OR_DEPARTMENT_OTHER): Payer: Self-pay | Admitting: Obstetrics

## 2020-03-21 ENCOUNTER — Ambulatory Visit (HOSPITAL_BASED_OUTPATIENT_CLINIC_OR_DEPARTMENT_OTHER)
Admission: RE | Admit: 2020-03-21 | Discharge: 2020-03-22 | Disposition: A | Discharge status: Home / Self Care | Payer: BC Managed Care – PPO | Attending: Obstetrics | Admitting: Obstetrics

## 2020-03-21 ENCOUNTER — Inpatient Hospital Stay: Payer: BC Managed Care – PPO | Admitting: Hematology

## 2020-03-21 ENCOUNTER — Encounter (HOSPITAL_BASED_OUTPATIENT_CLINIC_OR_DEPARTMENT_OTHER)
Admission: RE | Disposition: A | Discharge status: Home / Self Care | Payer: Self-pay | Source: Home / Self Care | Attending: Obstetrics

## 2020-03-21 ENCOUNTER — Ambulatory Visit (HOSPITAL_BASED_OUTPATIENT_CLINIC_OR_DEPARTMENT_OTHER): Payer: BC Managed Care – PPO | Admitting: Anesthesiology

## 2020-03-21 ENCOUNTER — Other Ambulatory Visit: Payer: Self-pay

## 2020-03-21 DIAGNOSIS — C50919 Malignant neoplasm of unspecified site of unspecified female breast: Secondary | ICD-10-CM | POA: Diagnosis present

## 2020-03-21 DIAGNOSIS — Z4002 Encounter for prophylactic removal of ovary: Secondary | ICD-10-CM | POA: Insufficient documentation

## 2020-03-21 DIAGNOSIS — Z4009 Encounter for prophylactic removal of other organ: Secondary | ICD-10-CM | POA: Diagnosis not present

## 2020-03-21 DIAGNOSIS — Z90710 Acquired absence of both cervix and uterus: Secondary | ICD-10-CM | POA: Diagnosis present

## 2020-03-21 DIAGNOSIS — Z853 Personal history of malignant neoplasm of breast: Secondary | ICD-10-CM | POA: Insufficient documentation

## 2020-03-21 DIAGNOSIS — Z1502 Genetic susceptibility to malignant neoplasm of ovary: Secondary | ICD-10-CM | POA: Diagnosis not present

## 2020-03-21 DIAGNOSIS — Z79899 Other long term (current) drug therapy: Secondary | ICD-10-CM | POA: Insufficient documentation

## 2020-03-21 HISTORY — PX: ROBOTIC ASSISTED TOTAL HYSTERECTOMY WITH BILATERAL SALPINGO OOPHERECTOMY: SHX6086

## 2020-03-21 LAB — POCT PREGNANCY, URINE: Preg Test, Ur: NEGATIVE

## 2020-03-21 SURGERY — HYSTERECTOMY, TOTAL, ROBOT-ASSISTED, LAPAROSCOPIC, WITH BILATERAL SALPINGO-OOPHORECTOMY
Anesthesia: General | Laterality: Bilateral

## 2020-03-21 MED ORDER — LACTATED RINGERS IV SOLN
INTRAVENOUS | Status: DC
  Administered 2020-03-21: 1000 mL via INTRAVENOUS

## 2020-03-21 MED ORDER — HYDROMORPHONE HCL 1 MG/ML IJ SOLN: 0.2000 mg | INTRAMUSCULAR | Status: DC | PRN

## 2020-03-21 MED ORDER — FENTANYL CITRATE (PF) 100 MCG/2ML IJ SOLN
INTRAMUSCULAR | Status: AC
  Filled 2020-03-21: qty 2

## 2020-03-21 MED ORDER — ZOLPIDEM TARTRATE 5 MG PO TABS
5.0000 mg | ORAL_TABLET | Freq: Every evening | ORAL | Status: DC | PRN
  Administered 2020-03-21: 5 mg via ORAL

## 2020-03-21 MED ORDER — KETOROLAC TROMETHAMINE 30 MG/ML IJ SOLN
INTRAMUSCULAR | Status: AC
  Filled 2020-03-21: qty 1

## 2020-03-21 MED ORDER — MENTHOL 3 MG MT LOZG: 1.0000 | LOZENGE | OROMUCOSAL | Status: DC | PRN

## 2020-03-21 MED ORDER — MIDAZOLAM HCL 2 MG/2ML IJ SOLN
INTRAMUSCULAR | Status: AC
  Filled 2020-03-21: qty 2

## 2020-03-21 MED ORDER — CEFAZOLIN SODIUM-DEXTROSE 2-4 GM/100ML-% IV SOLN
INTRAVENOUS | Status: AC
  Filled 2020-03-21: qty 100

## 2020-03-21 MED ORDER — ROCURONIUM BROMIDE 10 MG/ML (PF) SYRINGE
PREFILLED_SYRINGE | INTRAVENOUS | Status: DC | PRN
  Administered 2020-03-21: 20 mg via INTRAVENOUS
  Administered 2020-03-21: 60 mg via INTRAVENOUS

## 2020-03-21 MED ORDER — PROPOFOL 10 MG/ML IV BOLUS
INTRAVENOUS | Status: AC
  Filled 2020-03-21: qty 20

## 2020-03-21 MED ORDER — LIDOCAINE 2% (20 MG/ML) 5 ML SYRINGE
INTRAMUSCULAR | Status: DC | PRN
  Administered 2020-03-21: 1.5 mg/kg/h via INTRAVENOUS

## 2020-03-21 MED ORDER — EPHEDRINE 5 MG/ML INJ
INTRAVENOUS | Status: AC
  Filled 2020-03-21: qty 10

## 2020-03-21 MED ORDER — PROPOFOL 10 MG/ML IV BOLUS
INTRAVENOUS | Status: DC | PRN
  Administered 2020-03-21: 120 mg via INTRAVENOUS

## 2020-03-21 MED ORDER — LIDOCAINE HCL (PF) 2 % IJ SOLN
INTRAMUSCULAR | Status: AC
  Filled 2020-03-21: qty 5

## 2020-03-21 MED ORDER — SODIUM CHLORIDE 0.9 % IR SOLN
Status: DC | PRN
  Administered 2020-03-21: 3000 mL

## 2020-03-21 MED ORDER — METHYLENE BLUE 0.5 % INJ SOLN
INTRAVENOUS | Status: AC
  Filled 2020-03-21: qty 10

## 2020-03-21 MED ORDER — SIMETHICONE 80 MG PO CHEW
80.0000 mg | CHEWABLE_TABLET | Freq: Four times a day (QID) | ORAL | Status: DC | PRN
  Administered 2020-03-21: 80 mg via ORAL

## 2020-03-21 MED ORDER — DEXAMETHASONE SODIUM PHOSPHATE 10 MG/ML IJ SOLN
INTRAMUSCULAR | Status: DC | PRN
  Administered 2020-03-21 (×2): 5 mg via INTRAVENOUS

## 2020-03-21 MED ORDER — PANTOPRAZOLE SODIUM 40 MG PO TBEC
40.0000 mg | DELAYED_RELEASE_TABLET | Freq: Every day | ORAL | Status: DC
  Administered 2020-03-21: 40 mg via ORAL

## 2020-03-21 MED ORDER — PANTOPRAZOLE SODIUM 40 MG PO TBEC
DELAYED_RELEASE_TABLET | ORAL | Status: AC
  Filled 2020-03-21: qty 1

## 2020-03-21 MED ORDER — CEFAZOLIN SODIUM-DEXTROSE 2-4 GM/100ML-% IV SOLN
2.0000 g | INTRAVENOUS | Status: AC
  Administered 2020-03-21: 2 g via INTRAVENOUS

## 2020-03-21 MED ORDER — SODIUM CHLORIDE 0.9 % IV SOLN: INTRAVENOUS | Status: DC

## 2020-03-21 MED ORDER — DEXAMETHASONE SODIUM PHOSPHATE 10 MG/ML IJ SOLN
INTRAMUSCULAR | Status: AC
  Filled 2020-03-21: qty 1

## 2020-03-21 MED ORDER — ACETAMINOPHEN 500 MG PO TABS
ORAL_TABLET | ORAL | Status: AC
  Filled 2020-03-21: qty 2

## 2020-03-21 MED ORDER — POVIDONE-IODINE 10 % EX SWAB: 2.0000 "application " | Freq: Once | CUTANEOUS | Status: DC

## 2020-03-21 MED ORDER — SIMETHICONE 80 MG PO CHEW
CHEWABLE_TABLET | ORAL | Status: AC
  Filled 2020-03-21: qty 1

## 2020-03-21 MED ORDER — SERTRALINE HCL 50 MG PO TABS
100.0000 mg | ORAL_TABLET | Freq: Every day | ORAL | Status: DC
  Administered 2020-03-21: 100 mg via ORAL
  Filled 2020-03-21: qty 2

## 2020-03-21 MED ORDER — SUGAMMADEX SODIUM 200 MG/2ML IV SOLN
INTRAVENOUS | Status: DC | PRN
  Administered 2020-03-21: 130 mg via INTRAVENOUS

## 2020-03-21 MED ORDER — KETAMINE HCL 10 MG/ML IJ SOLN
INTRAMUSCULAR | Status: DC | PRN
  Administered 2020-03-21: 30 mg via INTRAVENOUS

## 2020-03-21 MED ORDER — LIDOCAINE 2% (20 MG/ML) 5 ML SYRINGE
INTRAMUSCULAR | Status: DC | PRN
  Administered 2020-03-21: 60 mg via INTRAVENOUS

## 2020-03-21 MED ORDER — OXYCODONE HCL 5 MG PO TABS
5.0000 mg | ORAL_TABLET | ORAL | Status: DC | PRN
  Administered 2020-03-21: 5 mg via ORAL
  Administered 2020-03-22 (×2): 10 mg via ORAL

## 2020-03-21 MED ORDER — ZOLPIDEM TARTRATE 5 MG PO TABS
ORAL_TABLET | ORAL | Status: AC
  Filled 2020-03-21: qty 1

## 2020-03-21 MED ORDER — ONDANSETRON HCL 4 MG/2ML IJ SOLN
INTRAMUSCULAR | Status: AC
  Filled 2020-03-21: qty 2

## 2020-03-21 MED ORDER — INDIGOTINDISULFONATE SODIUM 8 MG/ML IJ SOLN: INTRAMUSCULAR | Status: DC | PRN

## 2020-03-21 MED ORDER — SODIUM CHLORIDE (PF) 0.9 % IJ SOLN
INTRAMUSCULAR | Status: DC | PRN
  Administered 2020-03-21: 30 mL

## 2020-03-21 MED ORDER — IBUPROFEN 800 MG PO TABS
ORAL_TABLET | ORAL | Status: AC
  Filled 2020-03-21: qty 1

## 2020-03-21 MED ORDER — METHYLENE BLUE 0.5 % INJ SOLN
INTRAVENOUS | Status: DC | PRN
  Administered 2020-03-21: 2 mL via INTRAVENOUS

## 2020-03-21 MED ORDER — ONDANSETRON HCL 4 MG/2ML IJ SOLN
INTRAMUSCULAR | Status: DC | PRN
  Administered 2020-03-21: 4 mg via INTRAVENOUS

## 2020-03-21 MED ORDER — OXYCODONE HCL 5 MG PO TABS
ORAL_TABLET | ORAL | Status: AC
  Filled 2020-03-21: qty 1

## 2020-03-21 MED ORDER — KETAMINE HCL 10 MG/ML IJ SOLN
INTRAMUSCULAR | Status: AC
  Filled 2020-03-21: qty 1

## 2020-03-21 MED ORDER — ONDANSETRON HCL 4 MG/2ML IJ SOLN: 4.0000 mg | Freq: Four times a day (QID) | INTRAMUSCULAR | Status: DC | PRN

## 2020-03-21 MED ORDER — SODIUM CHLORIDE 0.9 % IV SOLN
INTRAVENOUS | Status: DC | PRN
  Administered 2020-03-21: 30 mL

## 2020-03-21 MED ORDER — IBUPROFEN 800 MG PO TABS
800.0000 mg | ORAL_TABLET | Freq: Four times a day (QID) | ORAL | Status: DC
  Administered 2020-03-21 – 2020-03-22 (×2): 800 mg via ORAL

## 2020-03-21 MED ORDER — KETOROLAC TROMETHAMINE 30 MG/ML IJ SOLN: 30.0000 mg | Freq: Four times a day (QID) | INTRAMUSCULAR | Status: DC

## 2020-03-21 MED ORDER — CEFAZOLIN SODIUM-DEXTROSE 1-4 GM/50ML-% IV SOLN
INTRAVENOUS | Status: AC
  Filled 2020-03-21: qty 50

## 2020-03-21 MED ORDER — ONDANSETRON HCL 4 MG PO TABS
4.0000 mg | ORAL_TABLET | Freq: Four times a day (QID) | ORAL | Status: DC | PRN
  Administered 2020-03-21: 4 mg via ORAL

## 2020-03-21 MED ORDER — KETOROLAC TROMETHAMINE 30 MG/ML IJ SOLN
INTRAMUSCULAR | Status: DC | PRN
  Administered 2020-03-21: 30 mg via INTRAVENOUS

## 2020-03-21 MED ORDER — ROCURONIUM BROMIDE 10 MG/ML (PF) SYRINGE
PREFILLED_SYRINGE | INTRAVENOUS | Status: AC
  Filled 2020-03-21: qty 10

## 2020-03-21 MED ORDER — BUPIVACAINE HCL (PF) 0.25 % IJ SOLN
INTRAMUSCULAR | Status: DC | PRN
  Administered 2020-03-21: 30 mL

## 2020-03-21 MED ORDER — ONDANSETRON 4 MG PO TBDP
ORAL_TABLET | ORAL | Status: AC
  Filled 2020-03-21: qty 1

## 2020-03-21 MED ORDER — EPHEDRINE SULFATE-NACL 50-0.9 MG/10ML-% IV SOSY
PREFILLED_SYRINGE | INTRAVENOUS | Status: DC | PRN
  Administered 2020-03-21: 15 mg via INTRAVENOUS

## 2020-03-21 MED ORDER — FENTANYL CITRATE (PF) 100 MCG/2ML IJ SOLN
INTRAMUSCULAR | Status: DC | PRN
  Administered 2020-03-21 (×4): 50 ug via INTRAVENOUS

## 2020-03-21 MED ORDER — MIDAZOLAM HCL 2 MG/2ML IJ SOLN
INTRAMUSCULAR | Status: DC | PRN
  Administered 2020-03-21: 2 mg via INTRAVENOUS

## 2020-03-21 MED ORDER — ACETAMINOPHEN 500 MG PO TABS
1000.0000 mg | ORAL_TABLET | Freq: Once | ORAL | Status: AC
  Administered 2020-03-21: 1000 mg via ORAL

## 2020-03-21 MED ORDER — HYDROMORPHONE HCL 1 MG/ML IJ SOLN: 0.2500 mg | INTRAMUSCULAR | Status: DC | PRN

## 2020-03-21 SURGICAL SUPPLY — 59 items
BARRIER ADHS 3X4 INTERCEED (GAUZE/BANDAGES/DRESSINGS) IMPLANT
CHLORAPREP W/TINT 26 (MISCELLANEOUS) ×2 IMPLANT
CNTNR URN SCR LID CUP LEK RST (MISCELLANEOUS) ×1 IMPLANT
CONT SPEC 4OZ STRL OR WHT (MISCELLANEOUS) ×1
COVER BACK TABLE 60X90IN (DRAPES) ×2 IMPLANT
COVER TIP SHEARS 8 DVNC (MISCELLANEOUS) ×1 IMPLANT
COVER TIP SHEARS 8MM DA VINCI (MISCELLANEOUS) ×1
COVER WAND RF STERILE (DRAPES) ×2 IMPLANT
DECANTER SPIKE VIAL GLASS SM (MISCELLANEOUS) ×4 IMPLANT
DEFOGGER SCOPE WARMER CLEARIFY (MISCELLANEOUS) ×2 IMPLANT
DERMABOND ADVANCED (GAUZE/BANDAGES/DRESSINGS) ×1
DERMABOND ADVANCED .7 DNX12 (GAUZE/BANDAGES/DRESSINGS) ×1 IMPLANT
DRAPE ARM DVNC X/XI (DISPOSABLE) ×4 IMPLANT
DRAPE COLUMN DVNC XI (DISPOSABLE) ×1 IMPLANT
DRAPE DA VINCI XI ARM (DISPOSABLE) ×4
DRAPE DA VINCI XI COLUMN (DISPOSABLE) ×1
DRAPE UTILITY XL STRL (DRAPES) ×2 IMPLANT
ELECT REM PT RETURN 9FT ADLT (ELECTROSURGICAL) ×2
ELECTRODE REM PT RTRN 9FT ADLT (ELECTROSURGICAL) ×1 IMPLANT
GLOVE BIO SURGEON STRL SZ 6.5 (GLOVE) ×6 IMPLANT
GLOVE BIOGEL PI IND STRL 7.0 (GLOVE) ×5 IMPLANT
GLOVE BIOGEL PI INDICATOR 7.0 (GLOVE) ×5
HOLDER FOLEY CATH W/STRAP (MISCELLANEOUS) IMPLANT
IRRIG SUCT STRYKERFLOW 2 WTIP (MISCELLANEOUS) ×2
IRRIGATION SUCT STRKRFLW 2 WTP (MISCELLANEOUS) ×1 IMPLANT
IV NS 1000ML (IV SOLUTION) ×1
IV NS 1000ML BAXH (IV SOLUTION) ×1 IMPLANT
KIT TURNOVER CYSTO (KITS) ×2 IMPLANT
LEGGING LITHOTOMY PAIR STRL (DRAPES) ×2 IMPLANT
OBTURATOR OPTICAL STANDARD 8MM (TROCAR) ×1
OBTURATOR OPTICAL STND 8 DVNC (TROCAR) ×1
OBTURATOR OPTICALSTD 8 DVNC (TROCAR) ×1 IMPLANT
OCCLUDER COLPOPNEUMO (BALLOONS) ×2 IMPLANT
PACK ROBOT WH (CUSTOM PROCEDURE TRAY) ×2 IMPLANT
PACK ROBOTIC GOWN (GOWN DISPOSABLE) ×2 IMPLANT
PACK TRENDGUARD 450 HYBRID PRO (MISCELLANEOUS) IMPLANT
PAD OB MATERNITY 4.3X12.25 (PERSONAL CARE ITEMS) ×2 IMPLANT
PAD PREP 24X48 CUFFED NSTRL (MISCELLANEOUS) ×2 IMPLANT
PROTECTOR NERVE ULNAR (MISCELLANEOUS) ×4 IMPLANT
SEAL CANN UNIV 5-8 DVNC XI (MISCELLANEOUS) ×4 IMPLANT
SEAL XI 5MM-8MM UNIVERSAL (MISCELLANEOUS) ×4
SEALER VESSEL DA VINCI XI (MISCELLANEOUS) ×1
SEALER VESSEL EXT DVNC XI (MISCELLANEOUS) ×1 IMPLANT
SET TRI-LUMEN FLTR TB AIRSEAL (TUBING) ×2 IMPLANT
SUT VIC AB 0 CT1 27 (SUTURE) ×2
SUT VIC AB 0 CT1 27XBRD ANBCTR (SUTURE) ×2 IMPLANT
SUT VIC AB 4-0 PS2 27 (SUTURE) ×6 IMPLANT
SUT VICRYL 0 UR6 27IN ABS (SUTURE) ×4 IMPLANT
SUT VLOC 180 0 9IN  GS21 (SUTURE) ×1
SUT VLOC 180 0 9IN GS21 (SUTURE) ×1 IMPLANT
TIP RUMI ORANGE 6.7MMX12CM (TIP) IMPLANT
TIP UTERINE 5.1X6CM LAV DISP (MISCELLANEOUS) ×2 IMPLANT
TIP UTERINE 6.7X10CM GRN DISP (MISCELLANEOUS) IMPLANT
TIP UTERINE 6.7X6CM WHT DISP (MISCELLANEOUS) IMPLANT
TIP UTERINE 6.7X8CM BLUE DISP (MISCELLANEOUS) IMPLANT
TOWEL OR 17X26 10 PK STRL BLUE (TOWEL DISPOSABLE) ×2 IMPLANT
TRAY FOLEY W/BAG SLVR 14FR LF (SET/KITS/TRAYS/PACK) ×2 IMPLANT
TRENDGUARD 450 HYBRID PRO PACK (MISCELLANEOUS)
TROCAR PORT AIRSEAL 5X120 (TROCAR) ×2 IMPLANT

## 2020-03-21 NOTE — Anesthesia Preprocedure Evaluation (Addendum)
Anesthesia Evaluation  Patient identified by MRN, date of birth, ID band Patient awake    Reviewed: Allergy & Precautions, NPO status , Patient's Chart, lab work & pertinent test results  Airway Mallampati: I  TM Distance: >3 FB Neck ROM: Full    Dental no notable dental hx. (+) Teeth Intact, Dental Advisory Given   Pulmonary asthma ,    Pulmonary exam normal breath sounds clear to auscultation       Cardiovascular negative cardio ROS Normal cardiovascular exam Rhythm:Regular Rate:Normal  TTE 2021 1. Left ventricular ejection fraction, by estimation, is 60 to 65%. The left ventricle has normal function. The left ventricle has no regional wall motion abnormalities. Left ventricular diastolic parameters were normal. The average left ventricular global longitudinal strain is -26.9 %.  2. Right ventricular systolic function is normal. The right ventricular size is normal.  3. The mitral valve is normal in structure. Trivial mitral valve regurgitation.  4. The aortic valve is normal in structure. Aortic valve regurgitation is not visualized.  5. The inferior vena cava is normal in size with greater than 50% respiratory variability, suggesting right atrial pressure of 3 mmHg.   Neuro/Psych  Headaches, PSYCHIATRIC DISORDERS Anxiety Depression    GI/Hepatic Neg liver ROS, GERD  Controlled,  Endo/Other  negative endocrine ROS  Renal/GU negative Renal ROS  negative genitourinary   Musculoskeletal negative musculoskeletal ROS (+)   Abdominal   Peds  Hematology  (+) Blood dyscrasia (Hgb 10.7), anemia ,   Anesthesia Other Findings   Reproductive/Obstetrics                           Anesthesia Physical Anesthesia Plan  ASA: II  Anesthesia Plan: General   Post-op Pain Management:    Induction: Intravenous  PONV Risk Score and Plan: 3 and Midazolam, Dexamethasone and Ondansetron  Airway  Management Planned: Oral ETT  Additional Equipment:   Intra-op Plan:   Post-operative Plan: Extubation in OR  Informed Consent: I have reviewed the patients History and Physical, chart, labs and discussed the procedure including the risks, benefits and alternatives for the proposed anesthesia with the patient or authorized representative who has indicated his/her understanding and acceptance.     Dental advisory given  Plan Discussed with: CRNA  Anesthesia Plan Comments:         Anesthesia Quick Evaluation

## 2020-03-21 NOTE — Transfer of Care (Signed)
Immediate Anesthesia Transfer of Care Note  Patient: Kim [name]  Procedure(s) Performed: Procedure(s) (LRB): XI ROBOTIC ASSISTED TOTAL HYSTERECTOMY WITH BILATERAL SALPINGO OOPHORECTOMY/CYSTOSCOPY,Pelvic Washing (Bilateral)  Patient Location: PACU  Anesthesia Type: General  Level of Consciousness: awake, oriented, sedated and patient cooperative  Airway & Oxygen Therapy: Patient Spontanous Breathing and Patient connected to face mask oxygen  Post-op Assessment: Report given to PACU RN and Post -op Vital signs reviewed and stable  Post vital signs: Reviewed and stable  Complications: No apparent anesthesia complications  Last Vitals:  Vitals Value Taken Time  BP 121/70 03/21/20 1633  Temp    Pulse 78 03/21/20 1634  Resp 12 03/21/20 1634  SpO2 100 % 03/21/20 1634  Vitals shown include unvalidated device data.  Last Pain:  Vitals:   03/21/20 1215  TempSrc: Oral  PainSc: 0-No pain      Patients Stated Pain Goal: 6 (51/88/41 [id number])  Complications: No complications documented.

## 2020-03-21 NOTE — Op Note (Signed)
03/21/2020  4:23 PM  PATIENT:  Kim [name]  44 y.o. female  PRE-OPERATIVE DIAGNOSIS:  PALB 2 Related Breast Cancer  POST-OPERATIVE DIAGNOSIS:  PALB 2 Related Breast Cancer  PROCEDURE:  Procedure(s) with comments: XI ROBOTIC ASSISTED TOTAL HYSTERECTOMY WITH BILATERAL SALPINGO OOPHORECTOMY/CYSTOSCOPY,Pelvic Washing (Bilateral) - [name] to RNFA confirmed on 03/03/20 CS  SURGEON:  Surgeon(s) and Role:    * [name], MD - Primary  PHYSICIAN ASSISTANT: None  ASSISTANTS: As above  ANESTHESIA:   local and general  EBL:  50 mL   BLOOD ADMINISTERED:none  DRAINS: Urinary Catheter (Foley)   LOCAL MEDICATIONS USED:  MARCAINE    and BUPIVICAINE   SPECIMEN:  Source of Specimen:  Uterus cervix bilateral fallopian tubes: Pelvic washings  DISPOSITION OF SPECIMEN:  PATHOLOGY  COUNTS:  YES  TOURNIQUET:  * No tourniquets in log *  DICTATION: .Note written in EPIC  PLAN OF CARE: Admit for overnight observation  PATIENT DISPOSITION:  PACU - hemodynamically stable.   Delay start of Pharmacological VTE agent (>24hrs) due to surgical blood loss or risk of bleeding: yes   Procedure:Robotic-assisted total laparoscopic hysterectomy with BSO Complications: none Antibiotics: 2 g Ancef Findings: nl b/l ovaries and tubes, normal uterus, normal liver edge, normal gallbladder hemostasis post-procedure with bilateral ureteral jets by cystoscopy  Indications: This is a 44 year old nonpregnant patient here for robotic assisted total laparoscopic hysterectomy with bilateral salpingo-oophorectomy for history of breast cancer and PALB-2 gene mutation  Procedure: After informed consent obtained, the patient was taken to the operating room where general anesthesia was initiated without difficulty.   She was prepped and draped in normal sterile fashion in the dorsal supine lithotomy position. A Foley catheter was inserted sterilely into the bladder. A bimanual examination was done to  assess the size and position of the uterus. A weighted speculum was placed in the vagina and deaver retractors were used on the anterior vaginal wall. .The cervix was grasped with tenaculum. The cervix was sounded to 6 cm. The cervix was assessed to identify the Rumi-Co size.  A small cup and an 6 cm shaft was used. The uterine balloon was inflated.  Gloved were changed. Attention was then turned to the patient's abdomen. 0.5 % marcaine was used prior to all incision. A total of 10 cc of marcaine was used.  A 10 mm incision was made in the umbilicus and blunt and sharp dissection was done until the fascia was identified. This was then grasped with Kocher clamps x2 and entered sharply. A suture of 0 Vicryl was then placed along the fascial incision and a non-bladed Sheryle Hail was inserted into the peritoneal cavity. Intraperitoneal placement was confirmed with the use of the camera and pneumoperitoneum was created to 15 mm of mercury. The suture was secured around the port and pneumoperitoneum was maintained. Brief survey of the abdomen and pelvis was done with findings as above. The abdominal wall was assessed and additional port sites were marked. 8 mm incisions were placed in the right (two) and left lower quadrants (2) and non-bladed ports were placed under direct visualization. The robot was brought to the patient's side and attached with the right side docking. The robotic instruments were placed under direct visualization until proper placement just over the uterus.  Pelvic washings were obtained.  I then went to the robotic console. Brief survey of the patient's abdomen and pelvis revealed  findings as above.Bilateral tubes and ovaries were normal. No significant adhesions were noted. Pelvic washings were taken.  I began on the left side: the  Left ureter was not seen well but its probable course was identified.  The left  IP were identified. The IP was serially cauterized and transected with the vessel  sealer.  The vessel sealer was used to serially cauterize and transect the mesosalpinx under the left fallopian tube.  The left round ligament was cauterized and divided.  The anterior leaf of the broad ligament was dissected bluntly then monopolar cautery was used to separate the anterior and posterior leafs of the broad ligament. This was carried down through to the bladder flap. Attention was then turned to the patient's  Right side: the right ureter was seen and its course was followed.  The right IP was serially cauterized and transected.  The right mesosalpinx was transected.  The right round  ligament was cauterized and transected. The anterior and posterior leaves of the broad ligament were bluntly and sharply dissected apart from each other. Monopolar cautery was used to come across the anterior leaf of the right broad ligament. This dissection was carried down across to the midline to create the bladder flap. The leftt uterine artery was serially cauterized with the vessel sealer and transected.  The right uterine artery and then serially cauterized with the vessel sealer and transected. The uterus was placed on stretch to allow better visualization of the arteries. The bladder flap was further taken down both sharply and with cautery. Cautery was used for hemostasis on several places along the bladder flap. At this point with the pressure inward on the Rumi, the anterior colpotomy was made with the monopolar scissors. This was carried around to the patient's right side. Additional bipolar cautery was used on the  both angles of the cuff- this was carried out with the vessel sealer. The uterus was then positioned  anteriorly  to allow easy access to the posterior  colpotomy. This was carried out with the monopolar scissors.  Good hemostasis was noted along the angles of the incision.  With manipulation of the specimen, the uterus, cervix,  bilateral tubes and ovaries were pulled out through the vagina.    Irrigation was used and the vaginal cuff appeared hemostatic. A 9 inch V-lock suture was placed though the umbilical port. Instruments were changed to allow a suture cut needle driver through the #1 port and and a long-tipped forceps through the #2 port. The right angle was closed and locked with the V-lock suture. Running suture was carried out tothe L angle.  A more superficial layer of suture was placed travelling back to the right angle. The suture was cut and removed. Excellent hemostasis was noted. Suction irrigation was carried out and hemostasis was assured along the vaginal cuff. The pedicles were reassessed also found to be hemostatic. All free fluid was removed from the abdomen.  The right ureter was seen with active peristalsis but the left ureter not well seen.  It is probable course was removed from the pedicles and incisions.  The robotic portion was completed. The robot was undocked.   In order to better evaluate the ureter patient was given indigo carmine and additional fluid bolus.  The robot was undocked and cystoscopy was carried out.  About 15 minutes were spent in cystoscopy and bilateral ureteral jets were seen with concentrated yellow urine.  Even about 30 minutes after the indigocarmine was given the urine had failed to turn blue.  Given the continued active peristalsis and bilateral jets and no evidence of suture in  the bladder the cystoscopy portion was complete.  By standard laparoscopy the pelvis was irrigated and evaluated. The patient was placed in reverse Trendelenburg, all additional fluid was suctioned from the abdomen and pelvis the cuff was reinspected and  found to be hemostatic. All pedicles were also found to be hemostatic. 20 CC 1/4% bupivicaine was placed into the peritoneal cavity.  Under direct visualization the ports were removed. Pneumoperitoneum was released and the umbilical port was removed.  The fascial incision at the umbilicus was closed with running 0  Vicryl.  No additional fascial incisions were closed due to the small size and the nondilated nature of these incisions. A 4-0 Vicryl was used to close the additional laparoscopic ports sites. Good hemostasis was noted.  Sponge lap and needle counts were correct x3 the patient was woken from general anesthesia having tolerated the procedure well and taken to the recovery room in a stable fashion.

## 2020-03-21 NOTE — Anesthesia Procedure Notes (Signed)
Procedure Name: Intubation Date/Time: 03/21/2020 1:53 PM Performed by: Suan Halter, CRNA Pre-anesthesia Checklist: Patient identified, Emergency Drugs available, Suction available and Patient being monitored Patient Re-evaluated:Patient Re-evaluated prior to induction Oxygen Delivery Method: Circle system utilized Preoxygenation: Pre-oxygenation with 100% oxygen Induction Type: IV induction Ventilation: Mask ventilation without difficulty Laryngoscope Size: Mac and 3 Grade View: Grade I Tube type: Oral Tube size: 7.0 mm Number of attempts: 1 Airway Equipment and Method: Stylet and Oral airway Placement Confirmation: ETT inserted through vocal cords under direct vision,  positive ETCO2 and breath sounds checked- equal and bilateral Secured at: 21 cm Tube secured with: Tape Dental Injury: Teeth and Oropharynx as per pre-operative assessment

## 2020-03-21 NOTE — Brief Op Note (Signed)
03/21/2020  4:23 PM  PATIENT:  Kim [name]  44 y.o. female  PRE-OPERATIVE DIAGNOSIS:  PALB 2 Related Breast Cancer  POST-OPERATIVE DIAGNOSIS:  PALB 2 Related Breast Cancer  PROCEDURE:  Procedure(s) with comments: XI ROBOTIC ASSISTED TOTAL HYSTERECTOMY WITH BILATERAL SALPINGO OOPHORECTOMY/CYSTOSCOPY,Pelvic Washing (Bilateral) - [name] to RNFA confirmed on 03/03/20 CS  SURGEON:  Surgeon(s) and Role:    * [name], MD - Primary  PHYSICIAN ASSISTANT: None  ASSISTANTS: As above  ANESTHESIA:   local and general  EBL:  50 mL   BLOOD ADMINISTERED:none  DRAINS: Urinary Catheter (Foley)   LOCAL MEDICATIONS USED:  MARCAINE    and BUPIVICAINE   SPECIMEN:  Source of Specimen:  Uterus cervix bilateral fallopian tubes: Pelvic washings  DISPOSITION OF SPECIMEN:  PATHOLOGY  COUNTS:  YES  TOURNIQUET:  * No tourniquets in log *  DICTATION: .Note written in EPIC  PLAN OF CARE: Admit for overnight observation  PATIENT DISPOSITION:  PACU - hemodynamically stable.   Delay start of Pharmacological VTE agent (>24hrs) due to surgical blood loss or risk of bleeding: yes

## 2020-03-21 NOTE — H&P (Signed)
Chief complaint:PALB-2 gene mutation for risk reducing BSO plus total hysterectomy, laparoscopic approach  History present illness: 44 year old G4 P2-0-1-3 with prior vaginal deliveries and subsequent development of right breast cancer who was found to carry PALB-2 gene mutation.  After discussion with her oncologist and genetic counseling decision was made to proceed with bilateral salpingo-oophorectomy.  After further discussion decision made to add hysterectomy for further risk reduction.  Patient has been on hold for definitive surgery due to pancytopenia from her chemotherapy.  Blood counts have stabilized and patient is now prepared for risk reduction surgery.  Prior to surgery patient had normal Pap with HPV - November 2021.  Normal pelvic ultrasound with uterus 6.7 x 4 x 4 cm, endometrial stripe 4.8 mm, no abnormalities her ovaries.  Patient had normal CA-125  Past Medical History:  Diagnosis Date  . Acid reflux   . Acute bronchitis   . Anemia    iron infusions  . Anxiety   . Asthma    rare  . Cancer ([place])   . Depression   . Family history of brain cancer   . Family history of breast cancer   . Family history of prostate cancer   . Migraine     Exercise-induced asthma, breast cancer Past Surgical History:  Procedure Laterality Date  . BREAST BIOPSY Right 05/23/2018   x3, malignant  . DILATION AND CURETTAGE OF UTERUS     2006 x1, 2009 x2  . LAPAROTOMY  2008  . PORTACATH PLACEMENT Left 06/06/2018   Procedure: INSERTION PORT-A-CATH POSSIBLE ULTRASOUND;  Surgeon: [name], MD;  Location: [facility];  Service: General;  Laterality: Left;    Prior diagnostic laparoscopy, vaginal delivery  Medications: Zoloft 100 mg daily letrozole 2.5 mg daily albuterol as needed, Nerlynx No known drug allergies  PE: Vitals:   03/21/20 1215  BP: (!) 99/57  Pulse: (!) 58  Resp: 14  Temp: 98.3 F (36.8 C)  SpO2: 99%   General: Well-appearing, no distress Abdomen:  Nontender, nondistended Lower extremities, nontender, no edema GU: Deferred to our  CBC    Component Value Date/Time   WBC 3.4 (L) 03/09/2020 1441   RBC 3.66 (L) 03/09/2020 1441   HGB 10.7 (L) 03/09/2020 1441   HGB 10.9 (L) 02/22/2020 1519   HCT 33.8 (L) 03/09/2020 1441   PLT 143 (L) 03/09/2020 1441   PLT 174 02/22/2020 1519   MCV 92.3 03/09/2020 1441   MCH 29.2 03/09/2020 1441   MCHC 31.7 03/09/2020 1441   RDW 15.4 03/09/2020 1441   LYMPHSABS 1.4 02/22/2020 1519   MONOABS 0.4 02/22/2020 1519   EOSABS 0.2 02/22/2020 1519   BASOSABS 0.0 02/22/2020 1519    Assessment plan: 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  Planning risk reduction abdominal hysterectomy with BSO.  Risk benefits reviewed with patient who agrees to proceed.  Platelets on the lower side but overall stable.  Bleeding risk discussed with patient.  Plan to hold from ER tonight plan SCDs overnight.  Will assess for possibility of heparin prophylaxis given her cancer but need to balance based on surgical findings and low platelets.  Ala Dach 03/21/2020 1:40 PM

## 2020-03-21 NOTE — Anesthesia Postprocedure Evaluation (Signed)
Anesthesia Post Note  Patient: Kim [name]  Procedure(s) Performed: XI ROBOTIC ASSISTED TOTAL HYSTERECTOMY WITH BILATERAL SALPINGO OOPHORECTOMY/CYSTOSCOPY,Pelvic Washing (Bilateral )     Patient location during evaluation: PACU Anesthesia Type: General Level of consciousness: awake and alert Pain management: pain level controlled Vital Signs Assessment: post-procedure vital signs reviewed and stable Respiratory status: spontaneous breathing, nonlabored ventilation and respiratory function stable Cardiovascular status: blood pressure returned to baseline and stable Postop Assessment: no apparent nausea or vomiting Anesthetic complications: no   No complications documented.  Last Vitals:  Vitals:   03/21/20 1656 03/21/20 1700  BP:  115/70  Pulse: 70 70  Resp: 13 13  Temp:    SpO2: 100% 100%    Last Pain:  Vitals:   03/21/20 1656  TempSrc:   PainSc: 3                  Baldwin Racicot,W. EDMOND

## 2020-03-22 ENCOUNTER — Encounter (HOSPITAL_BASED_OUTPATIENT_CLINIC_OR_DEPARTMENT_OTHER): Payer: Self-pay | Admitting: Obstetrics

## 2020-03-22 DIAGNOSIS — Z1502 Genetic susceptibility to malignant neoplasm of ovary: Secondary | ICD-10-CM | POA: Diagnosis not present

## 2020-03-22 LAB — CBC
HCT: 31.7 % — ABNORMAL LOW (ref 36.0–46.0)
Hemoglobin: 10.2 g/dL — ABNORMAL LOW (ref 12.0–15.0)
MCH: 29.4 pg (ref 26.0–34.0)
MCHC: 32.2 g/dL (ref 30.0–36.0)
MCV: 91.4 fL (ref 80.0–100.0)
Platelets: 133 10*3/uL — ABNORMAL LOW (ref 150–400)
RBC: 3.47 MIL/uL — ABNORMAL LOW (ref 3.87–5.11)
RDW: 16.8 % — ABNORMAL HIGH (ref 11.5–15.5)
WBC: 4.8 10*3/uL (ref 4.0–10.5)
nRBC: 0 % (ref 0.0–0.2)

## 2020-03-22 MED ORDER — ACETAMINOPHEN 500 MG PO TABS: 1000.0000 mg | ORAL_TABLET | Freq: Four times a day (QID) | ORAL | 1 refills | Status: DC | PRN

## 2020-03-22 MED ORDER — OXYCODONE HCL 5 MG PO TABS
ORAL_TABLET | ORAL | Status: AC
  Filled 2020-03-22: qty 2

## 2020-03-22 MED ORDER — OXYCODONE HCL 5 MG PO TABS
5.0000 mg | ORAL_TABLET | ORAL | 0 refills | Status: DC | PRN
Start: 2020-03-22 — End: 2020-03-30

## 2020-03-22 MED ORDER — IBUPROFEN 800 MG PO TABS
ORAL_TABLET | ORAL | Status: AC
  Filled 2020-03-22: qty 1

## 2020-03-22 MED ORDER — IBUPROFEN 800 MG PO TABS
800.0000 mg | ORAL_TABLET | Freq: Three times a day (TID) | ORAL | 1 refills | Status: DC | PRN
Start: 2020-03-22 — End: 2022-04-25

## 2020-03-22 NOTE — Discharge Summary (Signed)
Kim [name] MRN: [id number] DOB/AGE: 01-03-1976 44 y.o.  Admit date: 03/21/2020 Discharge date: March 22, 2020  Admission Diagnoses: Breast cancer ([place]) [C50.919] S/P hysterectomy with oophorectomy [Z90.710, Z90.721]  Discharge Diagnoses: Breast cancer ([place]) [C50.919] S/P hysterectomy with oophorectomy [Z90.710, Z90.721]        Active Problems:   Breast cancer ([place])   S/P hysterectomy with oophorectomy   Discharged Condition: stable  Hospital Course: Patient admitted for planned robotic assisted total laparoscopic hysterectomy with bilateral salpingectomy.  Uneventful surgery with addition of cystoscopy to ensure bilateral ureteral jets.  Overnight patient did well.  Foley catheter was removed at 10 PM.  Patient walked several times through the night and in the morning.  On hospital day 1 patient reports good appetite.  Tolerated regular p.o.  Has been walking and voiding.  Patient did have infiltration of her IV with swelling in her left arm.  Patient reports good bowel movement but no flatus yet.  Patient denies chest pain, shortness of breath.  Patient notes minimal vaginal bleeding.  Vitals with BMI 03/22/2020 03/22/2020 03/21/2020  Height - - -  Weight - - -  BMI - - -  Systolic [phone]  Diastolic 59 71 56  Pulse 65 67 72   General: Well-appearing, no distress Abdomen: Soft, mild tympany, no distention, appropriately tender GU: Scant staining Lower extremity: Nontender, no edema Left forearm swollen but no warmth or tenderness  CBC    Component Value Date/Time   WBC 4.8 03/22/2020 0500   RBC 3.47 (L) 03/22/2020 0500   HGB 10.2 (L) 03/22/2020 0500   HGB 10.9 (L) 02/22/2020 1519   HCT 31.7 (L) 03/22/2020 0500   PLT 133 (L) 03/22/2020 0500   PLT 174 02/22/2020 1519   MCV 91.4 03/22/2020 0500   MCH 29.4 03/22/2020 0500   MCHC 32.2 03/22/2020 0500   RDW 16.8 (H) 03/22/2020 0500   LYMPHSABS 1.4 02/22/2020 1519   MONOABS 0.4 02/22/2020 1519   EOSABS 0.2  02/22/2020 1519   BASOSABS 0.0 02/22/2020 1519     Consults: None  Treatments: surgery: Robotic assisted total laparoscopic hysterectomy with bilateral salpingo-oophorectomy, pelvic washings, cystoscopy  Disposition: Discharge disposition: 01-Home or Self Care        Allergies as of 03/22/2020   No Known Allergies     Medication List    STOP taking these medications   albuterol 108 (90 Base) MCG/ACT inhaler Commonly known as: VENTOLIN HFA   [employer] Probiotic Liqd   Budesonide ER 9 MG Tb24   propranolol 10 MG tablet Commonly known as: INDERAL   Vitamin D 50 MCG (2000 UT) Caps     TAKE these medications   acetaminophen 500 MG tablet Commonly known as: TYLENOL Take 2 tablets (1,000 mg total) by mouth every 6 (six) hours as needed for moderate pain.   ALPRAZolam 0.5 MG tablet Commonly known as: XANAX Take 0.25-0.5 mg by mouth at bedtime as needed for anxiety.   CALCIUM-MAGNESIUM-VITAMIN D PO Take 1 tablet by mouth daily.   diphenhydrAMINE 25 mg capsule Commonly known as: BENADRYL Take 25 mg by mouth at bedtime as needed for sleep.   ferrous sulfate 325 (65 FE) MG tablet Take 325 mg by mouth daily with breakfast.   ibuprofen 800 MG tablet Commonly known as: ADVIL Take 1 tablet (800 mg total) by mouth every 8 (eight) hours as needed. What changed:   medication strength  how much to take  when to take this  reasons to take this  letrozole 2.5 MG tablet Commonly known as: FEMARA TAKE 1 TABLET BY MOUTH EVERY DAY   melatonin 5 MG Tabs Take 5 mg by mouth at bedtime.   MIRALAX PO Take 17 g by mouth daily.   Nerlynx 40 MG tablet Generic drug: Neratinib Maleate TAKE 6 TABLETS BY MOUTH 1 TIME A DAY What changed:   See the new instructions.  Another medication with the same name was removed. Continue taking this medication, and follow the directions you see here.   oxyCODONE 5 MG immediate release tablet Commonly known as: Oxy  IR/ROXICODONE Take 1-2 tablets (5-10 mg total) by mouth every 4 (four) hours as needed for moderate pain.   senna 8.6 MG tablet Commonly known as: SENOKOT Take 1 tablet by mouth daily.   sertraline 100 MG tablet Commonly known as: ZOLOFT Take 1 tablet (100 mg total) by mouth daily.   ZOLADEX [place] Inject 1 Dose into the skin every 30 (thirty) minutes.   zolpidem 5 MG tablet Commonly known as: AMBIEN Take 5 mg by mouth at bedtime as needed for sleep.        Signed: Ala Dach, MD 03/22/2020, 8:25 AM

## 2020-03-22 NOTE — Discharge Instructions (Signed)
Laparoscopically Assisted Vaginal Hysterectomy, Care After This sheet gives you information about how to care for yourself after your procedure. Your health care provider may also give you more specific instructions. If you have problems or questions, contact your health care provider. What can I expect after the procedure? After the procedure, it is common to have:  Soreness and numbness in your incision areas.  Abdominal pain. You will be given pain medicine to control it.  Vaginal bleeding and discharge. You will need to use a sanitary napkin after this procedure.  Sore throat from the breathing tube that was inserted during surgery. Follow these instructions at home: Medicines  Take over-the-counter and prescription medicines only as told by your health care provider.  Do not take aspirin or ibuprofen. These medicines can cause bleeding.  Do not drive or use heavy machinery while taking prescription pain medicine.  Do not drive for 24 hours if you were given a medicine to help you relax (sedative) during the procedure. Incision care   Follow instructions from your health care provider about how to take care of your incisions. Make sure you: ? Wash your hands with soap and water before you change your bandage (dressing). If soap and water are not available, use hand sanitizer. ? Change your dressing as told by your health care provider. ? Leave stitches (sutures), skin glue, or adhesive strips in place. These skin closures may need to stay in place for 2 weeks or longer. If adhesive strip edges start to loosen and curl up, you may trim the loose edges. Do not remove adhesive strips completely unless your health care provider tells you to do that.  Check your incision area every day for signs of infection. Check for: ? Redness, swelling, or pain. ? Fluid or blood. ? Warmth. ? Pus or a bad smell. Activity  Get regular exercise as told by your health care provider. You may be  told to take short walks every day and go farther each time.  Return to your normal activities as told by your health care provider. Ask your health care provider what activities are safe for you.  Do not douche, use tampons, or have sexual intercourse for at least 6 weeks, or until your health care provider gives you permission.  Do not lift anything that is heavier than 10 lb (4.5 kg), or the limit that your health care provider tells you, until he or she says that it is safe. General instructions  Do not take baths, swim, or use a hot tub until your health care provider approves. Take showers instead of baths.  Do not drive for 24 hours if you received a sedative.  Do not drive or operate heavy machinery while taking prescription pain medicine.  To prevent or treat constipation while you are taking prescription pain medicine, your health care provider may recommend that you: ? Drink enough fluid to keep your urine clear or pale yellow. ? Take over-the-counter or prescription medicines. ? Eat foods that are high in fiber, such as fresh fruits and vegetables, whole grains, and beans. ? Limit foods that are high in fat and processed sugars, such as fried and sweet foods.  Keep all follow-up visits as told by your health care provider. This is important. Contact a health care provider if:  You have signs of infection, such as: ? Redness, swelling, or pain around your incision sites. ? Fluid or blood coming from an incision. ? An incision that feels warm to the   touch. ? Pus or a bad smell coming from an incision.  Your incision breaks open.  Your pain medicine is not helping.  You feel dizzy or light-headed.  You have pain or bleeding when you urinate.  You have persistent nausea and vomiting.  You have blood, pus, or a bad-smelling discharge from your vagina. Get help right away if:  You have a fever.  You have severe abdominal pain.  You have chest pain.  You have  shortness of breath.  You faint.  You have pain, swelling, or redness in your leg.  You have heavy bleeding from your vagina. Summary  After the procedure, it is common to have abdominal pain and vaginal bleeding.  You should not drive or lift heavy objects until your health care provider says that it is safe.  Contact your health care provider if you have any symptoms of infection, excessive vaginal bleeding, nausea, vomiting, or shortness of breath. This information is not intended to replace advice given to you by your health care provider. Make sure you discuss any questions you have with your health care provider. Document Revised: 03/15/2017 Document Reviewed: 05/29/2016 [employer] Patient Education  2020 [employer]  

## 2020-03-23 LAB — CYTOLOGY - NON PAP

## 2020-03-24 LAB — SURGICAL PATHOLOGY

## 2020-03-28 NOTE — Progress Notes (Signed)
[place]   [phone] [phone]   Clinic Follow up Note   Patient Care Team: [name], MD as PCP - General (Family Medicine) [name], MD as Consulting Physician (General Surgery) [name], MD as Consulting Physician (Hematology) [name], MD as Consulting Physician (Radiation Oncology) [name], MD as Consulting Physician (Obstetrics and Gynecology) [name], RN as Oncology Nurse Navigator [name], RN as Oncology Nurse Navigator [name], NP as Nurse Practitioner (Nurse Practitioner)  Date of Service:  03/30/2020  CHIEF COMPLAINT: F/u multifocal right breast cancer  SUMMARY OF ONCOLOGIC HISTORY: Oncology History Overview Note  Cancer Staging Cancer of overlapping sites of right female breast [facility]) Staging form: Breast, AJCC 8th Edition - Clinical stage from 05/28/2018: Stage IB (cT2, cN1, cM0, G3, ER+, PR+, HER2+) - Signed by [name], MD on 05/28/2018  Malignant neoplasm of lower-outer quadrant of right breast of female, estrogen receptor positive ([place]) Staging form: Breast, AJCC 8th Edition - Clinical stage from 05/28/2018: Stage IIB (cT2, cN1, cM0, G3, ER+, PR+, HER2-) - Unsigned     Cancer of overlapping sites of right female breast ([place])  05/14/2018 Mammogram   Right breast mammogram 05/14/18  IMPRESSION  There is a 1.0 x 0.5 x 1.2 cm lobular hypoechoic mass right breast 7 o'clock position 3 cm from nipple, a 0.8 x 0.6 x 1.2 cm lobular hypoechoic mass right breast 7 o'clock position 2 cm from the nipple, and a 0.9 x 0.5 x 0.8 cm lobular hypoechoic mass right breast 5 o'clock position 3 cm from nipple.  There is a 3.8 x 1.0 x 1.3 cm lobular hypoechoic mass right breast 5 o'clock position 2 cm from nipple.  Multiple thickened right axillary lymph nodes (approximately 15). Measuring up to 1.0 cm in thickness. Adenopathy corresponds with axillary palpable abnormality.  Indeterminate  round and punctate calcifications upper-outer quadrant right breast.Indeterminate round and punctate calcifications upper-outer quadrant right breast.   05/21/2018 Initial Biopsy   Diagnosis 05/21/18  1. Breast, right, needle core biopsy, axilla, 5 o'clock, ribbon clip - INVASIVE MAMMARY CARCINOMA. - MAMMARY CARCINOMA IN SITU. 2. Breast, right, needle core biopsy, 7 o'clock, coil clip - INVASIVE MAMMARY CARCINOMA. - MAMMARY CARCINOMA IN SITU. 3. Lymph node, needle/core biopsy, right axillary LN, hydromark - METASTATIC MAMMARY CARCINOMA.    05/21/2018 Receptors her2   1. PROGNOSTIC INDICATORS Results: The tumor cells are EQUIVOCAL for Her2 (2+);  HER2 **NEGATIVE** by FISH   Estrogen Receptor: 80%, POSITIVE, MODERATE STAINING INTENSITY Progesterone Receptor: 5%, POSITIVE, MODERATE-WEAK STAINING INTENSITY Proliferation Marker Ki67: 10%  3. PROGNOSTIC INDICATORS Results: The tumor cells are POSITIVE for Her2 (3+). Estrogen Receptor: 90%, POSITIVE, STRONG STAINING INTENSITY Progesterone Receptor: 50%, POSITIVE, MODERATE STAINING INTENSITY Proliferation Marker Ki67: 5%   05/26/2018 Initial Diagnosis   Malignant neoplasm of lower-inner quadrant of right breast of female, estrogen receptor positive ([place])   05/27/2018 Mammogram   Left breast mammogram 05/27/18  IMPRESSION: No mammographic evidence of LEFT breast malignancy.   05/28/2018 Cancer Staging   Staging form: Breast, AJCC 8th Edition - Clinical stage from 05/28/2018: Stage IB (cT2, cN1, cM0, G3, ER+, PR+, HER2+) - Signed by [name], MD on 05/28/2018   06/03/2018 Imaging   MR BREAST BILATERAL W WO CONTRAST INC CAD  IMPRESSION: 1. Large enhancing masses and non mass enhancement identified throughout the majority of the right breast. Additionally, there are smaller masses identified within the upper inner and lower inner right breast. Overall findings are  concerning for extensive malignancy throughout all 4 quadrants of the right  breast. 2. Extensive bulky lymphadenopathy identified within the right axilla. The matted appearance makes counting the number of abnormal lymph nodes difficult as they are immediately approximated to each other. 3. At least one abnormal appearing right internal mammary lymph node. 4. Nonenhancing lesion within the sternum with associated sternal marrow heterogeneity, raising the possibility of osseous metastatic disease within the sternum. 5. Two enhancing masses within the left breast as described above.     06/05/2018 PET scan   NM PET Image Initial (PI) Skull Base To Thigh  IMPRESSION: 1. Multiple borderline enlarged right axillary lymph nodes identified exhibiting mild to moderate increased uptake. Can not rule out nodal metastasis. Correlation with biopsy recommended. 2. Asymmetric increased uptake within the right breast corresponding to MRI abnormality. 3. Subcentimeter right retropectoral and right internal mammary lymph nodes are again identified and exhibit nonspecific, low level FDG uptake. 4. Noncalcified solid nodule within the lateral right lower lobe measures 9 mm without significant radiotracer uptake. 5. No hypermetabolic osseous lesions identified.     06/09/2018 - 11/03/2018 Chemotherapy   Neoadjuvant chemotherapy TCHP, 06/09/2018-09/22/18. Followed by Maintenance Herceptin/Perjeta q3weeks starting 10/14/18 to continue 1 year treatment. Stopped Herceptin/Perjeta on 11/03/18 due to multiple Positive LN of pathology.    06/10/2018 Pathology Results   Surgical pathology  Diagnosis 1. Breast, left, needle core biopsy, LOQ, barbell - MILD FIBROCYSTIC CHANGES. 2. Breast, left, needle core biopsy, UIQ cylinder - MILD FIBROCYSTIC CHANGES.    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 There were no  deleterious mutations in AIP, ALK, APC, ATM, AXIN2,BAP1,  BARD1, BLM, BMPR1A, BRCA1, BRCA2, BRIP1, CASR, CDC73, CDH1, CDK4, CDKN1B, CDKN1C, CDKN2A (p14ARF), CDKN2A (p16INK4a), CEBPA, CHEK2, CTNNA1, DICER1, DIS3L2, EGFR (c.2369C>T, p.Thr790Met variant only), EPCAM (Deletion/duplication testing only), FH, FLCN, GATA2, GPC3, GREM1 (Promoter region deletion/duplication testing only), HOXB13 (c.251G>A, p.Gly84Glu), HRAS, KIT, MAX, MEN1, MET, MITF (c.952G>A, p.Glu318Lys variant only), MLH1, MSH2, MSH3, MSH6, MUTYH, NBN, NF1, NF2, NTHL1, PDGFRA, PHOX2B, PMS2, POLD1, POLE, POT1, PRKAR1A, PTCH1, PTEN, RAD50, RAD51C, RAD51D, RB1, RECQL4, RET, RUNX1, SDHAF2, SDHA (sequence changes only), SDHB, SDHC, SDHD, SMAD4, SMARCA4, SMARCB1, SMARCE1, STK11, SUFU, TERC, TERT, TMEM127, TP53, TSC1, TSC2, VHL, WRN and WT1.  .   09/24/2018 Breast MRI   Breast [facility] from [facility] on 09/24/18 Right breast: Signal void inferior right breast, posterior depth from biopsy marker clip. Interval decreased size of the right breast, and now symmetric to the left, compared to prior MRI. Resolution of abnormal nonmass enhancement without evidence of residual enhancing mass. No evidence of axillary or internal mammary lymphadenopathy is seen, markedly improved from prior. There is no abnormal skin, nipple, or pectoralis muscle enhancement.  Left breast: There are no suspicious enhancing masses or areas of non-mass enhancement. No evidence of axillary or internal mammary lymphadenopathy is seen. There is no abnormal skin, nipple, or pectoralis muscle enhancement.   10/09/2018 Imaging   CT chest W Contrast 10/09/18  IMPRESSION: 1. Stable 10 mm right lower lobe pulmonary nodule since 06/05/2018. Nodule is not substantially hypermetabolic on the PET-CT. Continued attention on follow-up recommended. 2. No findings to suggest metastatic disease in the thorax.   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 surrounding tissue - Biopsy clips identified - Margin status (see extended anterior margin below)  Invasive carcinoma: Positive anterior-inferior margin  Ductal carcinoma in situ: Positive anterior-inferior margin - Ancillary studies previously reported on [id number](5:00, ribbon clip) Estrogen receptor: Positive (80%) Progesterone receptor: Positive (10%) HER2 IHC: Equivocal (2+) HER2 FISH: Negative HER2/CEP17 ratio: 1.35 Mean HER2 copy number: 1.55 - Ancillary studies previously reported on [id number] (right axillary lymph node) Estrogen receptor: Positive (90%) Progesterone receptor: Positive (20%) HER2 IHC: Positive (3+) - Residual Cancer Burden  Tumor bed size: 41 mm x 20 mm Overall tumor cellularity: 10% Percentage in situ: 60% Number of involved lymph nodes: 7 Diameter of largest metastasis: 6 mm Residual Cancer Burden Score: 3.108 Residual Cancer Burden Class: RCB-II - Other findings: Atypical lobular hyperplasia and columnar cell change with microcalcifications; fibroadenomas with microcalcifications  B: Right axilla, targeted lymph node excision - Six of seven lymph nodes positive for carcinoma (6/7) - Size of largest tumor deposit: 6 mm - Biopsy site identified - Treatment effect: Present - Extracapsular extension: Absent  C: Right axilla, lymph node dissection - One of seven lymph nodes positive for carcinoma, macrometastasis, 6 mm (1/7) - Treatment effect: Absent - Extracapsular extension: Absent  D: Right breast, nipple core biopsy - Negative for carcinoma  E: Breast, right, anterior margin, excision - Microscopic foci of ductal carcinoma in situ - Margin: Negative, <1 mm    11/05/2018  Cancer Staging   Staging form: Breast, AJCC 8th Edition - Pathologic stage from 11/05/2018: No Stage Recommended (ypT1c, pN2a, cM0, G2, ER+, PR+, HER2-) - Signed by [name], MD on 11/23/2018   11/24/2018 - 08/24/2019 Chemotherapy   Due to positive LN on surgical pathology her maintance Herceptin/Perjeta was changed to [place] for about 14 cycles on 11/24/18. Completed on 08/24/19   12/24/2018 - 02/03/2019 Radiation Therapy   adjuvant radiation at [facility] on 12/24/18 and completed on 02/03/19.    01/23/2019 Imaging   CT Chest wo  IMPRESSION: 1. Stable right lower lobe pulmonary nodule. 2. No signs of new or suspicious finding since study of 10/09/2018, now status post right mastectomy, axillary dissection and breast reconstruction.   02/16/2019 -  Anti-estrogen oral therapy   -Monthly Zoladex injection started 12/19/18-02/22/20. Stopped after BSO surgery in 03/2020 -Letrozole 2.5 mg started 02/16/19    04/20/2019 Survivorship   SCP delivered by Cira Rue, NP    09/22/2019 -  Chemotherapy   Nerlynx starting at 4 tablets (160 mg total) by mouth daily for 7 days, THEN 5 tablets (200 mg total) daily for 7 days, THEN 6 tablets (240 mg total) daily for 16 days, starting on 09/22/19.    11/24/2019 Surgery   left prophylactic mastectomy and bilateral reconstruction at [facility] by Dr Dorann Ou 11/24/19.   03/21/2020 Surgery   XI ROBOTIC ASSISTED TOTAL HYSTERECTOMY WITH BILATERAL SALPINGO OOPHORECTOMY/CYSTOSCOPY, Pelvic Washing by Dr Emilia Beck  A. PERITONEAL, WASHING:    FINAL MICROSCOPIC DIAGNOSIS:  - Reactive mesothelial cells present    FINAL MICROSCOPIC DIAGNOSIS:   A. UTERUS, CERVIX, BILATERAL TUBE AND OVARIES:  - Uterus:       Endometrium: Inactive endometrium. No hyperplasia or malignancy.       Myometrium: Unremarkable. No malignancy.       Serosa: Unremarkable. No malignancy.  - Cervix:  Benign squamous and endocervical mucosa. No dysplasia or  malignancy.  -  Bilateral ovaries: Unremarkable. No malignancy.  - Bilateral fallopian tubes: Unremarkable. No malignancy.        CURRENT THERAPY:  -Letrozole 2.5 mg started 02/16/19 -Nerlynx startingon 09/22/19 titrated up to 246m over 3 weeks.   INTERVAL HISTORY:  MAMYLAH WILLis here for a follow up. She was last seen by me 6 months ago and seen by NP Lacie in interim. She presents to the clinic alone. She notes she is doing well. She notes she has recovered from her breast surgery, breast cellulitis and BSO surgery. She had low iron in 02/2020. She notes known history of mild intermittent anemia for many years. She did not have period for 4 years prior to treatment due to [place] She denies any obvious GI blood loss. Her last Colonoscopy in 2011 was clear. She notes she is a vegetarian. She is currently on multivitamin with iron. She uses Miralax and Colase for her known colonoscopy. She notes she continues to tolerate Letrozole well with manageable hot flashes.  [place] She has not used Ambien recently.    REVIEW OF SYSTEMS:   Constitutional: Denies fevers, chills or abnormal weight loss Eyes: Denies blurriness of vision Ears, nose, mouth, throat, and face: Denies mucositis or sore throat Respiratory: Denies cough, dyspnea or wheezes Cardiovascular: Denies palpitation, chest discomfort or lower extremity swelling Gastrointestinal:  Denies nausea, heartburn (+) Constipation  Skin: Denies abnormal skin rashes MSK: (+) Mild hip pain  Lymphatics: Denies new lymphadenopathy or easy bruising Neurological:Denies numbness, tingling or new weaknesses Behavioral/Psych: Mood is stable, no new changes  All other systems were reviewed with the patient and are negative.  MEDICAL HISTORY:  Past Medical History:  Diagnosis Date  . Acid reflux   . Acute bronchitis   . Anemia    iron infusions  . Anxiety   . Asthma    rare  . Cancer ([place]   . Depression    . Family history of brain cancer   . Family history of breast cancer   . Family history of prostate cancer   . Migraine     SURGICAL HISTORY: Past Surgical History:  Procedure Laterality Date  . BREAST BIOPSY Right 05/23/2018   x3, malignant  . DILATION AND CURETTAGE OF UTERUS     2006 x1, 2009 x2  . LAPAROTOMY  2008  . PORTACATH PLACEMENT Left 06/06/2018   Procedure: INSERTION PORT-A-CATH POSSIBLE ULTRASOUND;  Surgeon: TJovita Kussmaul MD;  [place]  Service: General;  Laterality: Left;  . ROBOTIC ASSISTED TOTAL HYSTERECTOMY WITH BILATERAL SALPINGO OOPHERECTOMY Bilateral 03/21/2020   Procedure: XI ROBOTIC ASSISTED TOTAL HYSTERECTOMY WITH BILATERAL SALPINGO OOPHORECTOMY/CYSTOSCOPY,Pelvic Washing;  Surgeon: FAloha Gell MD;  [place]  Service: Gynecology;  Laterality: Bilateral;  [name] to RNFA confirmed on 03/03/20 CS    I have reviewed the social history and family history with the patient and they are unchanged from previous note.  ALLERGIES:  has No Known Allergies.  MEDICATIONS:  Current Outpatient Medications  Medication Sig Dispense Refill  . acetaminophen (TYLENOL) 500 MG tablet Take 2 tablets (1,000 mg total) by mouth every 6 (six) hours as needed for moderate pain. 60 tablet 1  . ALPRAZolam (XANAX) 0.5 MG tablet Take 0.25-0.5 mg by mouth at bedtime as needed for anxiety.     .Marland KitchenCALCIUM-MAGNESIUM-VITAMIN D PO Take 1 tablet by  mouth daily.    . diphenhydrAMINE (BENADRYL) 25 mg capsule Take 25 mg by mouth at bedtime as needed for sleep.    . ferrous sulfate 325 (65 FE) MG tablet Take 325 mg by mouth daily with breakfast.    . ibuprofen (ADVIL) 800 MG tablet Take 1 tablet (800 mg total) by mouth every 8 (eight) hours as needed. 60 tablet 1  . letrozole (FEMARA) 2.5 MG tablet Take 1 tablet (2.5 mg total) by mouth daily. 90 tablet 1  . melatonin 5 MG TABS Take 5 mg by mouth at bedtime.     . NERLYNX 40 MG tablet TAKE  6 TABLETS BY MOUTH 1 TIME A DAY (Patient taking differently: Take 240 mg by mouth daily. ) 180 tablet 2  . Polyethylene Glycol 3350 (MIRALAX PO) Take 17 g by mouth daily.     Marland Kitchen senna (SENOKOT) 8.6 MG tablet Take 1 tablet by mouth daily.    . sertraline (ZOLOFT) 100 MG tablet Take 1 tablet (100 mg total) by mouth daily. 90 tablet 2  . zolpidem (AMBIEN) 5 MG tablet Take 5 mg by mouth at bedtime as needed for sleep.      No current facility-administered medications for this visit.    PHYSICAL EXAMINATION: ECOG PERFORMANCE STATUS: 0 - Asymptomatic  Vitals:   03/30/20 1118  BP: 102/64  Pulse: (!) 56  Resp: 16  Temp: 97.6 F (36.4 C)  SpO2: 100%   Filed Weights   03/30/20 1118  Weight: 140 lb 8 oz (63.7 kg)    GENERAL:alert, no distress and comfortable SKIN: skin color, texture, turgor are normal, no rashes or significant lesions EYES: normal, Conjunctiva are pink and non-injected, sclera clear  NECK: supple, thyroid normal size, non-tender, without nodularity LYMPH:  no palpable lymphadenopathy in the cervical, axillary  LUNGS: clear to auscultation and percussion with normal breathing effort HEART: regular rate & rhythm and no murmurs and no lower extremity edema ABDOMEN:abdomen soft, non-tender and normal bowel sounds (+) Multiple incisions healed well  Musculoskeletal:no cyanosis of digits and no clubbing  NEURO: alert & oriented x 3 with fluent speech, no focal motor/sensory deficits BREAST: S/p b/l mastectomy and reconstruction with implant in place: Surgical incisions healed well. No palpable mass, nodules or adenopathy bilaterally. Breast exam benign.   LABORATORY DATA:  I have reviewed the data as listed CBC Latest Ref Rng & Units 03/30/2020 03/22/2020 03/09/2020  WBC 4.0 - 10.5 K/uL 3.0(L) 4.8 3.4(L)  Hemoglobin 12.0 - 15.0 g/dL 11.2(L) 10.2(L) 10.7(L)  Hematocrit 36.0 - 46.0 % 34.9(L) 31.7(L) 33.8(L)  Platelets 150 - 400 K/uL 159 133(L) 143(L)     CMP Latest Ref  Rng & Units 03/30/2020 03/09/2020 02/22/2020  Glucose 70 - 99 mg/dL 85 105(H) 102(H)  BUN 6 - 20 mg/dL _0 Creatinine 0.44 - 1.00 mg/dL 0.81 0.70 0.85  Sodium 135 - 145 mmol/L 149(H) 141 145  Potassium 3.5 - 5.1 mmol/L 4.3 4.3 4.5  Chloride 98 - 111 mmol/L 110 105 109  CO2 22 - 32 mmol/L 32 28 28  Calcium 8.9 - 10.3 mg/dL 9.6 9.3 9.5  Total Protein 6.5 - 8.1 g/dL 7.1 - 7.6  Total Bilirubin 0.3 - 1.2 mg/dL 0.4 - 0.2(L)  Alkaline Phos 38 - 126 U/L 63 - 98  AST 15 - 41 U/L 13(L) - 19  ALT 0 - 44 U/L 22 - 25      RADIOGRAPHIC STUDIES: I have personally reviewed the radiological images as listed and  agreed with the findings in the report. No results found.   ASSESSMENT & PLAN:  Kim [name] is a 44 y.o. 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 She completed maintenance therapy with Herceptin/Perjeta then 14 cycles ofadjuvant Kadcyla.  -I started her on antiestrogen therapy with Letrozole in 02/2019 (monthly Zoladex 12/2018-02/2020). Tolerating well with manageable hot flashes and mood swings, which has improved with Zoloft. -To further reduce her risk of recurrence I started her on anti-HER2 treatment with Nerlynx on6/8/21. Plan to complete in 09/2020.  -She underwent left prophylactic mastectomy and bilateral reconstruction at [facility] by Dr Dorann Ou 11/24/19. Developed cellulitis of breast 10 weeks after, now resolved.  -She continues to tolerate Nerlynx and Letrozole well. She is clinically doing well. Labs reviewed, WBC 3, Hg 11.2. Physical exam today was benign. She is recovering from surgeries well.  -She will continue Nerlynx 2108m daily and  Letrozole 242mdaily.  -F/u in 3 months   2. Iron deficient Anemia, B12 deficiency   -Her 02/22/20 labs showed Hg 10.9, Feritin <4, iron 27, Sat ratio 7, B12 264. She has been symptomatic.  -She is currently on oral iron and B12. She was treated with 3 doses of Venofer 40067mn 02/2020.  -I discussed with IDA there is concern for blood loss. Prior to BSO she did not have period for 4 years while on Mirena. She denies obvious GI bleeding. Last Endoscopy in 2011. I recommend Colonoscopy/Endoscopy for further evaluation. She is agreeable, I will send GI referral.  -She notes she is also vegetarian. I recommend she continue her oral iron and B12.    3.Genetics:[id number]+ -Genetic testingfrom [facility] done 05/2018 was positive for PALB2mut45mon. She has discovered a stronger maternal family history of prostate and breast cancerin her1st,2nd and 3rd degree relatives, Including her mother who was recently diagnosed. -We discussed the risk of future breast cancer from PALB2muta40mn. Given her familyhistory, she is s/p b/l mastectomy and reconstruction (10/21/18 and 11/24/19) and s/p BSO with hysterectomy (03/21/20).  -While there is an increased risk for other cancers, there are no current guidelines for screening for these cancers, and at this time NCCN does not suggest a discussion of risk reducing salpingo-oophorectomy. -Given recent significant iron deficiency, will proceed with colonoscopy/endoscopy   4. 9mm RL71mung nodule  -Her initial2/20/20PET showed a 9 mm solid rounded nodule within the lateral right lower lobe, not hypermetabolic -Her attempted lung biopsy was unsuccessful. Will monitor -Her 3/26/21CT chest showed stable lung nodule, no new findings.Will continue to monitor for 2 years, next scan in early 2022  5.Osteopenia -I reviewed AI such as letrozole can weaken her bone  -Her baseline DEXA from 07/21/19 shows osteopenia with lowest T-score -1.3 at left femur neck. Will  monitor on AI with repeat scan every 2 years. -She will continue Calcium and Vit D along with weight bearing exercise. I have briefly discussed the benfit of Zometa q6months78monthyears if bone density worsens.   6.Insomnia, Stress/Anxiety -Worsened with breast cancer diagnosis.  -Currently on Zoloft, Xanax. She has not needed Ambien. Mood improved after major treatments.   7.COVID-19 positive infection in 11/11/19 (mildly symptomatic) - She has recovered well    Plan -Continue Nerlynx 240mg [id number] continue Letrozole  -Refer to [facility]  Endoscopy/Colonoscopy for iron deficient anemia  -Lab and F/u in 3 months  -CT chest within next 2-4 weeks, I gave her the radiology number to call and schedule     No problem-specific Assessment & Plan notes found for this encounter.   No orders of the defined types were placed in this encounter.  All questions were answered. The patient knows to call the clinic with any problems, questions or concerns. No barriers to learning was detected. The total time spent in the appointment was 30 minutes.     [name], MD 03/30/2020   I, Joslyn Devon, am acting as scribe for [name], MD.   I have reviewed the above documentation for accuracy and completeness, and I agree with the above.

## 2020-03-30 ENCOUNTER — Inpatient Hospital Stay: Payer: BC Managed Care – PPO | Attending: Hematology

## 2020-03-30 ENCOUNTER — Ambulatory Visit: Payer: BC Managed Care – PPO

## 2020-03-30 ENCOUNTER — Encounter: Payer: Self-pay | Admitting: Hematology

## 2020-03-30 ENCOUNTER — Inpatient Hospital Stay (HOSPITAL_BASED_OUTPATIENT_CLINIC_OR_DEPARTMENT_OTHER): Payer: BC Managed Care – PPO | Admitting: Hematology

## 2020-03-30 ENCOUNTER — Other Ambulatory Visit: Payer: Self-pay

## 2020-03-30 VITALS — BP 102/64 | HR 56 | Temp 97.6°F | Resp 16 | Ht 69.0 in | Wt 140.5 lb

## 2020-03-30 DIAGNOSIS — C773 Secondary and unspecified malignant neoplasm of axilla and upper limb lymph nodes: Secondary | ICD-10-CM | POA: Insufficient documentation

## 2020-03-30 DIAGNOSIS — C50811 Malignant neoplasm of overlapping sites of right female breast: Secondary | ICD-10-CM | POA: Insufficient documentation

## 2020-03-30 DIAGNOSIS — Z8616 Personal history of COVID-19: Secondary | ICD-10-CM | POA: Insufficient documentation

## 2020-03-30 DIAGNOSIS — R918 Other nonspecific abnormal finding of lung field: Secondary | ICD-10-CM | POA: Insufficient documentation

## 2020-03-30 DIAGNOSIS — Z79899 Other long term (current) drug therapy: Secondary | ICD-10-CM | POA: Insufficient documentation

## 2020-03-30 DIAGNOSIS — Z79811 Long term (current) use of aromatase inhibitors: Secondary | ICD-10-CM | POA: Insufficient documentation

## 2020-03-30 DIAGNOSIS — Z170 Estrogen receptor positive status [ER+]: Secondary | ICD-10-CM | POA: Diagnosis not present

## 2020-03-30 DIAGNOSIS — M85852 Other specified disorders of bone density and structure, left thigh: Secondary | ICD-10-CM | POA: Insufficient documentation

## 2020-03-30 DIAGNOSIS — E538 Deficiency of other specified B group vitamins: Secondary | ICD-10-CM | POA: Diagnosis not present

## 2020-03-30 DIAGNOSIS — Z923 Personal history of irradiation: Secondary | ICD-10-CM | POA: Insufficient documentation

## 2020-03-30 DIAGNOSIS — D509 Iron deficiency anemia, unspecified: Secondary | ICD-10-CM | POA: Insufficient documentation

## 2020-03-30 LAB — CMP (CANCER CENTER ONLY)
ALT: 22 U/L (ref 0–44)
AST: 13 U/L — ABNORMAL LOW (ref 15–41)
Albumin: 4.2 g/dL (ref 3.5–5.0)
Alkaline Phosphatase: 63 U/L (ref 38–126)
Anion gap: 7 (ref 5–15)
BUN: 15 mg/dL (ref 6–20)
CO2: 32 mmol/L (ref 22–32)
Calcium: 9.6 mg/dL (ref 8.9–10.3)
Chloride: 110 mmol/L (ref 98–111)
Creatinine: 0.81 mg/dL (ref 0.44–1.00)
GFR, Estimated: >60 mL/min (ref >60)
Glucose, Bld: 85 mg/dL (ref 70–99)
Potassium: 4.3 mmol/L (ref 3.5–5.1)
Sodium: 149 mmol/L — ABNORMAL HIGH (ref 135–145)
Total Bilirubin: 0.4 mg/dL (ref 0.3–1.2)
Total Protein: 7.1 g/dL (ref 6.5–8.1)

## 2020-03-30 LAB — CBC WITH DIFFERENTIAL (CANCER CENTER ONLY)
Abs Immature Granulocytes: 0.01 10*3/uL (ref 0.00–0.07)
Basophils Absolute: 0 10*3/uL (ref 0.0–0.1)
Basophils Relative: 1 %
Eosinophils Absolute: 0.2 10*3/uL (ref 0.0–0.5)
Eosinophils Relative: 6 %
HCT: 34.9 % — ABNORMAL LOW (ref 36.0–46.0)
Hemoglobin: 11.2 g/dL — ABNORMAL LOW (ref 12.0–15.0)
Immature Granulocytes: 0 %
Lymphocytes Relative: 22 %
Lymphs Abs: 0.6 10*3/uL — ABNORMAL LOW (ref 0.7–4.0)
MCH: 29.5 pg (ref 26.0–34.0)
MCHC: 32.1 g/dL (ref 30.0–36.0)
MCV: 91.8 fL (ref 80.0–100.0)
Monocytes Absolute: 0.3 10*3/uL (ref 0.1–1.0)
Monocytes Relative: 9 %
Neutro Abs: 1.8 10*3/uL (ref 1.7–7.7)
Neutrophils Relative %: 62 %
Platelet Count: 159 10*3/uL (ref 150–400)
RBC: 3.8 MIL/uL — ABNORMAL LOW (ref 3.87–5.11)
RDW: 17.2 % — ABNORMAL HIGH (ref 11.5–15.5)
WBC Count: 3 10*3/uL — ABNORMAL LOW (ref 4.0–10.5)
nRBC: 0 % (ref 0.0–0.2)

## 2020-03-30 MED ORDER — LETROZOLE 2.5 MG PO TABS
2.5000 mg | ORAL_TABLET | Freq: Every day | ORAL | 1 refills | Status: DC
Start: 2020-03-30 — End: 2020-09-14

## 2020-03-31 ENCOUNTER — Telehealth: Payer: Self-pay | Admitting: Hematology

## 2020-03-31 NOTE — Telephone Encounter (Signed)
Scheduled appts per 12/15 los. Pt confirmed appt date and time.

## 2020-04-01 ENCOUNTER — Other Ambulatory Visit: Payer: Self-pay

## 2020-04-01 DIAGNOSIS — D508 Other iron deficiency anemias: Secondary | ICD-10-CM

## 2020-04-01 NOTE — Progress Notes (Signed)
Referral, ov note, lab results demographics, and insurance cards faxed to [facility] at [phone]

## 2020-04-29 ENCOUNTER — Ambulatory Visit (HOSPITAL_COMMUNITY)
Admission: RE | Admit: 2020-04-29 | Discharge: 2020-04-29 | Disposition: A | Discharge status: Home / Self Care | Payer: BC Managed Care – PPO | Source: Ambulatory Visit | Attending: Nurse Practitioner | Admitting: Nurse Practitioner

## 2020-04-29 ENCOUNTER — Other Ambulatory Visit: Payer: Self-pay

## 2020-04-29 DIAGNOSIS — R911 Solitary pulmonary nodule: Secondary | ICD-10-CM | POA: Diagnosis not present

## 2020-05-02 ENCOUNTER — Telehealth: Payer: Self-pay | Admitting: *Deleted

## 2020-05-02 NOTE — Telephone Encounter (Signed)
-----   Message from [name], NP sent at 05/01/2020  3:10 PM EST ----- Please let her know the right lung nodule is unchanged, no concern for metastatic disease in the chest. Thanks, Regan Rakers, NP

## 2020-05-02 NOTE — Telephone Encounter (Signed)
Notified of message below

## 2020-06-24 NOTE — Progress Notes (Signed)
[place]   [phone] [phone]) [phone]   Clinic Follow up Note   Patient Care Team: [name], MD as PCP - General (Family Medicine) [name], MD as Consulting Physician (General Surgery) [name], MD as Consulting Physician (Hematology) [name], MD as Consulting Physician (Radiation Oncology) [name], MD as Consulting Physician (Obstetrics and Gynecology) [name], RN as Oncology Nurse Navigator [name], RN as Oncology Nurse Navigator [name], NP as Nurse Practitioner (Nurse Practitioner)  Date of Service:  06/29/2020  CHIEF COMPLAINT: F/u multifocal right breast cancer  SUMMARY OF ONCOLOGIC HISTORY: Oncology History Overview Note  Cancer Staging Cancer of overlapping sites of right female breast [facility]) Staging form: Breast, AJCC 8th Edition - Clinical stage from 05/28/2018: Stage IB (cT2, cN1, cM0, G3, ER+, PR+, HER2+) - Signed by [name], MD on 05/28/2018  Malignant neoplasm of lower-outer quadrant of right breast of female, estrogen receptor positive ([place]) Staging form: Breast, AJCC 8th Edition - Clinical stage from 05/28/2018: Stage IIB (cT2, cN1, cM0, G3, ER+, PR+, HER2-) - Unsigned     Cancer of overlapping sites of right female breast ([place])  05/14/2018 Mammogram   Right breast mammogram 05/14/18  IMPRESSION  There is a 1.0 x 0.5 x 1.2 cm lobular hypoechoic mass right breast 7 o'clock position 3 cm from nipple, a 0.8 x 0.6 x 1.2 cm lobular hypoechoic mass right breast 7 o'clock position 2 cm from the nipple, and a 0.9 x 0.5 x 0.8 cm lobular hypoechoic mass right breast 5 o'clock position 3 cm from nipple.  There is a 3.8 x 1.0 x 1.3 cm lobular hypoechoic mass right breast 5 o'clock position 2 cm from nipple.  Multiple thickened right axillary lymph nodes (approximately 15). Measuring up to 1.0 cm in thickness. Adenopathy corresponds with axillary palpable abnormality.  Indeterminate  round and punctate calcifications upper-outer quadrant right breast.Indeterminate round and punctate calcifications upper-outer quadrant right breast.   05/21/2018 Initial Biopsy   Diagnosis 05/21/18  1. Breast, right, needle core biopsy, axilla, 5 o'clock, ribbon clip - INVASIVE MAMMARY CARCINOMA. - MAMMARY CARCINOMA IN SITU. 2. Breast, right, needle core biopsy, 7 o'clock, coil clip - INVASIVE MAMMARY CARCINOMA. - MAMMARY CARCINOMA IN SITU. 3. Lymph node, needle/core biopsy, right axillary LN, hydromark - METASTATIC MAMMARY CARCINOMA.    05/21/2018 Receptors her2   1. PROGNOSTIC INDICATORS Results: The tumor cells are EQUIVOCAL for Her2 (2+);  HER2 **NEGATIVE** by FISH   Estrogen Receptor: 80%, POSITIVE, MODERATE STAINING INTENSITY Progesterone Receptor: 5%, POSITIVE, MODERATE-WEAK STAINING INTENSITY Proliferation Marker Ki67: 10%  3. PROGNOSTIC INDICATORS Results: The tumor cells are POSITIVE for Her2 (3+). Estrogen Receptor: 90%, POSITIVE, STRONG STAINING INTENSITY Progesterone Receptor: 50%, POSITIVE, MODERATE STAINING INTENSITY Proliferation Marker Ki67: 5%   05/26/2018 Initial Diagnosis   Malignant neoplasm of lower-inner quadrant of right breast of female, estrogen receptor positive ([place])   05/27/2018 Mammogram   Left breast mammogram 05/27/18  IMPRESSION: No mammographic evidence of LEFT breast malignancy.   05/28/2018 Cancer Staging   Staging form: Breast, AJCC 8th Edition - Clinical stage from 05/28/2018: Stage IB (cT2, cN1, cM0, G3, ER+, PR+, HER2+) - Signed by [name], MD on 05/28/2018   06/03/2018 Imaging   MR BREAST BILATERAL W WO CONTRAST INC CAD  IMPRESSION: 1. Large enhancing masses and non mass enhancement identified throughout the majority of the right breast. Additionally, there are smaller masses identified within the upper inner and lower inner right breast. Overall findings are  concerning for extensive malignancy throughout all 4 quadrants of the right  breast. 2. Extensive bulky lymphadenopathy identified within the right axilla. The matted appearance makes counting the number of abnormal lymph nodes difficult as they are immediately approximated to each other. 3. At least one abnormal appearing right internal mammary lymph node. 4. Nonenhancing lesion within the sternum with associated sternal marrow heterogeneity, raising the possibility of osseous metastatic disease within the sternum. 5. Two enhancing masses within the left breast as described above.     06/05/2018 PET scan   NM PET Image Initial (PI) Skull Base To Thigh  IMPRESSION: 1. Multiple borderline enlarged right axillary lymph nodes identified exhibiting mild to moderate increased uptake. Can not rule out nodal metastasis. Correlation with biopsy recommended. 2. Asymmetric increased uptake within the right breast corresponding to MRI abnormality. 3. Subcentimeter right retropectoral and right internal mammary lymph nodes are again identified and exhibit nonspecific, low level FDG uptake. 4. Noncalcified solid nodule within the lateral right lower lobe measures 9 mm without significant radiotracer uptake. 5. No hypermetabolic osseous lesions identified.     06/09/2018 - 11/03/2018 Chemotherapy   Neoadjuvant chemotherapy TCHP, 06/09/2018-09/22/18. Followed by Maintenance Herceptin/Perjeta q3weeks starting 10/14/18 to continue 1 year treatment. Stopped Herceptin/Perjeta on 11/03/18 due to multiple Positive LN of pathology.    06/10/2018 Pathology Results   Surgical pathology  Diagnosis 1. Breast, left, needle core biopsy, LOQ, barbell - MILD FIBROCYSTIC CHANGES. 2. Breast, left, needle core biopsy, UIQ cylinder - MILD FIBROCYSTIC CHANGES.    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 There were no  deleterious mutations in AIP, ALK, APC, ATM, AXIN2,BAP1,  BARD1, BLM, BMPR1A, BRCA1, BRCA2, BRIP1, CASR, CDC73, CDH1, CDK4, CDKN1B, CDKN1C, CDKN2A (p14ARF), CDKN2A (p16INK4a), CEBPA, CHEK2, CTNNA1, DICER1, DIS3L2, EGFR (c.2369C>T, p.Thr790Met variant only), EPCAM (Deletion/duplication testing only), FH, FLCN, GATA2, GPC3, GREM1 (Promoter region deletion/duplication testing only), HOXB13 (c.251G>A, p.Gly84Glu), HRAS, KIT, MAX, MEN1, MET, MITF (c.952G>A, p.Glu318Lys variant only), MLH1, MSH2, MSH3, MSH6, MUTYH, NBN, NF1, NF2, NTHL1, PDGFRA, PHOX2B, PMS2, POLD1, POLE, POT1, PRKAR1A, PTCH1, PTEN, RAD50, RAD51C, RAD51D, RB1, RECQL4, RET, RUNX1, SDHAF2, SDHA (sequence changes only), SDHB, SDHC, SDHD, SMAD4, SMARCA4, SMARCB1, SMARCE1, STK11, SUFU, TERC, TERT, TMEM127, TP53, TSC1, TSC2, VHL, WRN and WT1.  .   09/24/2018 Breast MRI   Breast [facility] from [facility] on 09/24/18 Right breast: Signal void inferior right breast, posterior depth from biopsy marker clip. Interval decreased size of the right breast, and now symmetric to the left, compared to prior MRI. Resolution of abnormal nonmass enhancement without evidence of residual enhancing mass. No evidence of axillary or internal mammary lymphadenopathy is seen, markedly improved from prior. There is no abnormal skin, nipple, or pectoralis muscle enhancement.  Left breast: There are no suspicious enhancing masses or areas of non-mass enhancement. No evidence of axillary or internal mammary lymphadenopathy is seen. There is no abnormal skin, nipple, or pectoralis muscle enhancement.   10/09/2018 Imaging   CT chest W Contrast 10/09/18  IMPRESSION: 1. Stable 10 mm right lower lobe pulmonary nodule since 06/05/2018. Nodule is not substantially hypermetabolic on the PET-CT. Continued attention on follow-up recommended. 2. No findings to suggest metastatic disease in the thorax.   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 and surrounding tissue - Biopsy clips identified - Margin status (see extended anterior margin below)  Invasive carcinoma: Positive anterior-inferior margin  Ductal carcinoma in situ: Positive anterior-inferior margin - Ancillary studies previously reported on [id number](5:00, ribbon clip) Estrogen receptor: Positive (80%) Progesterone receptor: Positive (10%) HER2 IHC: Equivocal (2+) HER2 FISH: Negative HER2/CEP17 ratio: 1.35 Mean HER2 copy number: 1.55 - Ancillary studies previously reported on [id number] (right axillary lymph node) Estrogen receptor: Positive (90%) Progesterone receptor: Positive (20%) HER2 IHC: Positive (3+) - Residual Cancer Burden  Tumor bed size: 41 mm x 20 mm Overall tumor cellularity: 10% Percentage in situ: 60% Number of involved lymph nodes: 7 Diameter of largest metastasis: 6 mm Residual Cancer Burden Score: 3.108 Residual Cancer Burden Class: RCB-II - Other findings: Atypical lobular hyperplasia and columnar cell change with microcalcifications; fibroadenomas with microcalcifications  B: Right axilla, targeted lymph node excision - Six of seven lymph nodes positive for carcinoma (6/7) - Size of largest tumor deposit: 6 mm - Biopsy site identified - Treatment effect: Present - Extracapsular extension: Absent  C: Right axilla, lymph node dissection - One of seven lymph nodes positive for carcinoma, macrometastasis, 6 mm (1/7) - Treatment effect: Absent - Extracapsular extension: Absent  D: Right breast, nipple core biopsy - Negative for carcinoma  E: Breast, right, anterior margin, excision - Microscopic foci of ductal carcinoma in situ - Margin: Negative, <1 mm    11/05/2018  Cancer Staging   Staging form: Breast, AJCC 8th Edition - Pathologic stage from 11/05/2018: No Stage Recommended (ypT1c, pN2a, cM0, G2, ER+, PR+, HER2-) - Signed by [name], MD on 11/23/2018   11/24/2018 - 08/24/2019 Chemotherapy   Due to positive LN on surgical pathology her maintance Herceptin/Perjeta was changed to [place] for about 14 cycles on 11/24/18. Completed on 08/24/19   12/24/2018 - 02/03/2019 Radiation Therapy   adjuvant radiation at [facility] on 12/24/18 and completed on 02/03/19.    01/23/2019 Imaging   CT Chest wo  IMPRESSION: 1. Stable right lower lobe pulmonary nodule. 2. No signs of new or suspicious finding since study of 10/09/2018, now status post right mastectomy, axillary dissection and breast reconstruction.   02/16/2019 -  Anti-estrogen oral therapy   -Monthly Zoladex injection started 12/19/18-02/22/20. Stopped after BSO surgery in 03/2020 -Letrozole 2.5 mg started 02/16/19    04/20/2019 Survivorship   SCP delivered by Cira Rue, NP    09/22/2019 - 09/2020 Chemotherapy   Nerlynx starting at 4 tablets (160 mg total) by mouth daily for 7 days, THEN 5 tablets (200 mg total) daily for 7 days, THEN 6 tablets (240 mg total) daily for 16 days, starting on 09/22/19.    11/24/2019 Surgery   left prophylactic mastectomy and bilateral reconstruction at [facility] by Dr Dorann Ou 11/24/19.   03/21/2020 Surgery   XI ROBOTIC ASSISTED TOTAL HYSTERECTOMY WITH BILATERAL SALPINGO OOPHORECTOMY/CYSTOSCOPY, Pelvic Washing by Dr Emilia Beck  A. PERITONEAL, WASHING:    FINAL MICROSCOPIC DIAGNOSIS:  - Reactive mesothelial cells present    FINAL MICROSCOPIC DIAGNOSIS:   A. UTERUS, CERVIX, BILATERAL TUBE AND OVARIES:  - Uterus:       Endometrium: Inactive endometrium. No hyperplasia or malignancy.       Myometrium: Unremarkable. No malignancy.       Serosa: Unremarkable. No malignancy.  - Cervix:  Benign squamous and endocervical mucosa. No dysplasia or  malignancy.   - Bilateral ovaries: Unremarkable. No malignancy.  - Bilateral fallopian tubes: Unremarkable. No malignancy.        CURRENT THERAPY:  -Letrozole 2.5 mg started 02/16/19 -Nerlynx startingon 6/8/21titrated up to 274m over 3 weeks.Stopped in June 2022  INTERVAL HISTORY:  Kim DONNANis here for a follow up of left breast cancer. She presents to the clinic alone. She notes she was able to return to work full time. She notes scar tissue in her right axilla but PT and stretching helps. She denies neuropathy. She notes she had colonoscopy/Eneodopy with Dr BAlessandra Bevelslast week and was seen to have 1 benign polyp and gastritis. She is not very symptomatic but started on Prilosec. She denies cough or SOB. She notes normal BM without block or bloody stool. She notes her constipation after colonoscopy has improved. I reviewed her medication list with her. She tolerates Nerlynx 6 tabs once daily. She is also tolerating Letrozole. She notes deep lower back pain which she was seen to have muscular from her orthopedist as this only occurs with movement. She notes this has started 2-3 months ago and pain does not radiate. She notes palpation tenderness along ribcage from her muscles. She notes moving around helps her muscular pain.  She notes a 8 weeks trip starting in mid June.   REVIEW OF SYSTEMS:   Constitutional: Denies fevers, chills or abnormal weight loss Eyes: Denies blurriness of vision Ears, nose, mouth, throat, and face: Denies mucositis or sore throat Respiratory: Denies cough, dyspnea or wheezes Cardiovascular: Denies palpitation, chest discomfort or lower extremity swelling Gastrointestinal:  Denies nausea, heartburn (+) Improved constipation  Skin: Denies abnormal skin rashes MSK: (+)Deep lower back pain Lymphatics: Denies new lymphadenopathy or easy bruising Neurological:Denies numbness, tingling or new weaknesses Behavioral/Psych: Mood is stable, no new changes  All other  systems were reviewed with the patient and are negative.  MEDICAL HISTORY:  Past Medical History:  Diagnosis Date   Acid reflux    Acute bronchitis    Anemia    iron infusions   Anxiety    Asthma    rare   [place]    Depression    Family history of brain cancer    Family history of breast cancer    Family history of prostate cancer    Migraine     SURGICAL HISTORY: Past Surgical History:  Procedure Laterality Date   BREAST BIOPSY Right 05/23/2018   x3, malignant   DILATION AND CURETTAGE OF UTERUS     2006 x1, 2009 x2   LAPAROTOMY  2008   PORTACATH PLACEMENT Left 06/06/2018   Procedure: INSERTION PORT-A-CATH POSSIBLE ULTRASOUND;  Surgeon: TJovita Kussmaul MD;  [place] [facility]  Service: General;  Laterality: Left;   ROBOTIC ASSISTED TOTAL HYSTERECTOMY WITH BILATERAL SALPINGO OOPHERECTOMY Bilateral 03/21/2020   Procedure: XI ROBOTIC ASSISTED TOTAL HYSTERECTOMY WITH BILATERAL SALPINGO OOPHORECTOMY/CYSTOSCOPY,Pelvic Washing;  Surgeon: FAloha Gell MD;  [place] [facility]  Service: Gynecology;  Laterality: Bilateral;  [name] to RNFA confirmed on 03/03/20 CS    I have reviewed the social history and family history with the patient and they are unchanged from previous note.  ALLERGIES:  has No Known Allergies.  MEDICATIONS:  Current Outpatient Medications  Medication Sig Dispense Refill   ALPRAZolam (XANAX) 0.5 MG tablet Take 0.25-0.5 mg by mouth at bedtime as needed for anxiety.      CALCIUM-MAGNESIUM-VITAMIN D PO  Take 1 tablet by mouth daily.     ferrous sulfate 325 (65 FE) MG tablet Take 325 mg by mouth daily with breakfast.     ibuprofen (ADVIL) 800 MG tablet Take 1 tablet (800 mg total) by mouth every 8 (eight) hours as needed. 60 tablet 1   letrozole (FEMARA) 2.5 MG tablet Take 1 tablet (2.5 mg total) by mouth daily. 90 tablet 1   melatonin 5 MG TABS Take 5 mg by mouth at bedtime.      Neratinib  Maleate (NERLYNX) 40 MG tablet TAKE 6 TABLETS BY MOUTH 1 TIME A DAY 180 tablet 2   Polyethylene Glycol 3350 (MIRALAX PO) Take 17 g by mouth daily.      senna (SENOKOT) 8.6 MG tablet Take 1 tablet by mouth daily.     sertraline (ZOLOFT) 100 MG tablet Take 1 tablet (100 mg total) by mouth daily. 90 tablet 2   zolpidem (AMBIEN) 5 MG tablet Take 5 mg by mouth at bedtime as needed for sleep.      No current facility-administered medications for this visit.    PHYSICAL EXAMINATION: ECOG PERFORMANCE STATUS: 0 - Asymptomatic  Vitals:   06/29/20 0906  BP: 102/65  Pulse: (!) 50  Resp: 13  Temp: 97.6 F (36.4 C)  SpO2: 100%   Filed Weights   06/29/20 0906  Weight: 137 lb (62.1 kg)    GENERAL:alert, no distress and comfortable SKIN: skin color, texture, turgor are normal, no rashes or significant lesions EYES: normal, Conjunctiva are pink and non-injected, sclera clear  NECK: supple, thyroid normal size, non-tender, without nodularity LYMPH:  no palpable lymphadenopathy in the cervical, axillary  LUNGS: clear to auscultation and percussion with normal breathing effort HEART: regular rate & rhythm and no murmurs and no lower extremity edema ABDOMEN:abdomen soft, non-tender and normal bowel sounds (+) Mild RUQ MSK tenderness  Musculoskeletal:no cyanosis of digits and no clubbing NEURO: alert & oriented x 3 with fluent speech, no focal motor/sensory deficits BREAST: S/p b/l mastectomy and reconstruction: surgical incisions healed well with right breast scar tissue, tenderness. No palpable mass, nodules or adenopathy bilaterally. Breast exam benign.   LABORATORY DATA:  I have reviewed the data as listed CBC Latest Ref Rng & Units 06/29/2020 03/30/2020 03/22/2020  WBC 4.0 - 10.5 K/uL 2.8(L) 3.0(L) 4.8  Hemoglobin 12.0 - 15.0 g/dL 12.7 11.2(L) 10.2(L)  Hematocrit 36.0 - 46.0 % 39.1 34.9(L) 31.7(L)  Platelets 150 - 400 K/uL 172 159 133(L)     CMP Latest Ref Rng & Units 06/29/2020  03/30/2020 03/09/2020  Glucose 70 - 99 mg/dL 83 85 105(H)  BUN 6 - 20 mg/dL '18 15 15  ' Creatinine 0.44 - 1.00 mg/dL 0.89 0.81 0.70  Sodium 135 - 145 mmol/L 141 149(H) 141  Potassium 3.5 - 5.1 mmol/L 4.0 4.3 4.3  Chloride 98 - 111 mmol/L 103 110 105  CO2 22 - 32 mmol/L 30 32 28  Calcium 8.9 - 10.3 mg/dL 9.7 9.6 9.3  Total Protein 6.5 - 8.1 g/dL 7.5 7.1 -  Total Bilirubin 0.3 - 1.2 mg/dL 0.6 0.4 -  Alkaline Phos 38 - 126 U/L 66 63 -  AST 15 - 41 U/L 10(L) 13(L) -  ALT 0 - 44 U/L 14 22 -      RADIOGRAPHIC STUDIES: I have personally reviewed the radiological images as listed and agreed with the findings in the report. No results found.   ASSESSMENT & PLAN:  Kim [name] is a 45 y.o. 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 She completed maintenance therapy with Herceptin/Perjeta then 14 cycles ofadjuvant Kadcyla.  -I started her on antiestrogen therapy with Letrozole in 02/2019 (monthly Zoladex 12/2018-02/2020). Tolerating well with manageable hot flashes and mood swings, which has improved with Zoloft. -To further reduce her risk of recurrence I started her on anti-HER2 treatment with Nerlynx on6/8/21.Plan to complete in 09/2020.  -She underwent left prophylactic mastectomy and bilateral reconstruction at [facility] by Dr Dorann Ou 11/24/19. Developed cellulitis of breast 10 weeks after, now resolved.  -She continues to tolerate Nerlynx and Letrozole well. She is clinically doing well. Labs reviewed, WBC 2.8, ANC 1.5. Physical exam today was benign. She has recovered well from surgeries and previous chemo well.  -She will continue Nerlynx 213m daily and  Letrozole 2.531mdaily. Plan to complete Nerlynx in June 2022.  -I reviewed surveillance with additional scan as indicated, monitoring with closer follows, labs and exams. She is s/p b/l mastectomy and does not require mammograms.  -F/u in 3 months (first week of June)  2. Iron deficient Anemia, B12 deficiency   -Her 02/22/20 labs showed Hg 10.9, Feritin <4, iron 27, Sat ratio 7, B12 264. She has been symptomatic.  -She is currently on oral iron and B12. She was treated with 3 doses of Venofer 40058mn 02/2020.  -I discussed with IDA there is concern for blood loss. Prior to BSO she did not have period for 4 years while on Mirena. She denies obvious GI bleeding.   -She notes she is also vegetarian. I recommend she continue her oral iron and B12.  -Her 06/2020 Colonoscopy/Upper Endoscopy with Dr BraAlessandra Bevelsowed 1 benign polyp and gastritis, per patient. I will obtain the report.  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 She has discovered a stronger maternal family history of prostate and breast cancerin her1st,2nd and 3rd degree relatives, Including her mother who was recently diagnosed. -We discussed the risk of future breast cancer from PALB2muta57mn. Given her familyhistory, she is s/p b/l mastectomy and reconstruction (10/21/18 and 11/24/19) and s/p BSO with hysterectomy (03/21/20).  -While there is an increased risk for other cancers, there are no current guidelines for screening for these cancers, and at this time NCCN does not suggest a discussion of risk reducing salpingo-oophorectomy. -Her baseline 06/2020 colonoscopy/endoscopy was benign per patient.   4. 9mm RL27mung nodule  -Her initial2/20/20PET showed a 9 mm solid rounded nodule within the lateral right lower lobe, not hypermetabolic -Her attempted lung biopsy was unsuccessful. Will monitor -Her 3/26/21CT chest showed stable lung  nodule, no new findings.Stable on 04/29/20 CT Chest. Will continue to monitor  -plan to repeat CT chest one more time in 04/2021  5.Osteopenia -I reviewed AI such as letrozole can weaken her bone  -Her baseline DEXA from 07/21/19 shows osteopenia with lowest T-score -1.3 at left femur neck. Will monitor on AI with repeat scan every 2 years. -She will continue Calcium and Vit D along with weight bearing exercise. I have previously discussed the benfit of Zometa q6months43monthyears if bone density worsens. May consider later this year after she completes Nerlynx   6.Insomnia, Stress/Anxiety -Worsened with breast cancer diagnosis.  -Currently on Zoloft, Xanax. She has not needed Ambien.  Mood improved after major treatments.   7.COVID-19 positive infection in 11/11/19 (mildly symptomatic) - She has recovered well   8. Left Lower back pain  -She notes for the past 2-3 months having this muscular pain that only occurs with certain movement.  -Exercise helps but does not resolve pain.  -I recommend MRI lumbar spine for further evaluation. She is agreeable. I will call her with results.    Plan -I refilled Nerlynx and zoloft today.  -Continue Nerlynx 245m daily and continue Letrozoledaily. Stop Nerlynx in mid June 2022 -MRI Lumbar spine in 1-2 weeks to evaluate her low back pain, will call her to discuss her scan findings.  -Request endoscopy/colonocopy report from Dr BAlessandra Bevels-Lab and F/u in 3 months (first week of June)    No problem-specific Assessment & Plan notes found for this encounter.   Orders Placed This Encounter  Procedures   MR Lumbar Spine W Wo Contrast    Epic ORDER    Standing Status:   Future    Standing Expiration Date:   06/29/2021    Order Specific Question:   If indicated for the ordered procedure, I authorize the administration of contrast media per Radiology protocol    Answer:   Yes    Order Specific Question:   What is the patient's sedation  requirement?    Answer:   No Sedation    Order Specific Question:   Does the patient have a pacemaker or implanted devices?    Answer:   No    Order Specific Question:   Use SRS Protocol?    Answer:   No    Order Specific Question:   Preferred imaging location?    Answer:   [facility] (table limit-550lbs)   [place]    Standing Status:   Standing    Number of Occurrences:   5    Standing Expiration Date:   06/29/2021   All questions were answered. The patient knows to call the clinic with any problems, questions or concerns. No barriers to learning was detected. The total time spent in the appointment was 30 minutes.     YTruitt Merle MD 06/29/2020   I, AJoslyn Devon am acting as scribe for YTruitt Merle MD.   I have reviewed the above documentation for accuracy and completeness, and I agree with the above.

## 2020-06-28 ENCOUNTER — Other Ambulatory Visit: Payer: Self-pay | Admitting: Hematology

## 2020-06-28 ENCOUNTER — Other Ambulatory Visit: Payer: Self-pay

## 2020-06-28 DIAGNOSIS — C50811 Malignant neoplasm of overlapping sites of right female breast: Secondary | ICD-10-CM

## 2020-06-28 DIAGNOSIS — D508 Other iron deficiency anemias: Secondary | ICD-10-CM

## 2020-06-28 DIAGNOSIS — Z170 Estrogen receptor positive status [ER+]: Secondary | ICD-10-CM

## 2020-06-29 ENCOUNTER — Inpatient Hospital Stay: Payer: BC Managed Care – PPO | Attending: Hematology

## 2020-06-29 ENCOUNTER — Inpatient Hospital Stay (HOSPITAL_BASED_OUTPATIENT_CLINIC_OR_DEPARTMENT_OTHER): Payer: BC Managed Care – PPO | Admitting: Hematology

## 2020-06-29 ENCOUNTER — Other Ambulatory Visit: Payer: Self-pay

## 2020-06-29 ENCOUNTER — Encounter: Payer: Self-pay | Admitting: Hematology

## 2020-06-29 VITALS — BP 102/65 | HR 50 | Temp 97.6°F | Resp 13 | Ht 69.0 in | Wt 137.0 lb

## 2020-06-29 DIAGNOSIS — Z170 Estrogen receptor positive status [ER+]: Secondary | ICD-10-CM | POA: Insufficient documentation

## 2020-06-29 DIAGNOSIS — F419 Anxiety disorder, unspecified: Secondary | ICD-10-CM | POA: Insufficient documentation

## 2020-06-29 DIAGNOSIS — Z79811 Long term (current) use of aromatase inhibitors: Secondary | ICD-10-CM | POA: Insufficient documentation

## 2020-06-29 DIAGNOSIS — R911 Solitary pulmonary nodule: Secondary | ICD-10-CM | POA: Insufficient documentation

## 2020-06-29 DIAGNOSIS — E538 Deficiency of other specified B group vitamins: Secondary | ICD-10-CM | POA: Insufficient documentation

## 2020-06-29 DIAGNOSIS — C50811 Malignant neoplasm of overlapping sites of right female breast: Secondary | ICD-10-CM | POA: Insufficient documentation

## 2020-06-29 DIAGNOSIS — G4700 Insomnia, unspecified: Secondary | ICD-10-CM | POA: Insufficient documentation

## 2020-06-29 DIAGNOSIS — D508 Other iron deficiency anemias: Secondary | ICD-10-CM

## 2020-06-29 DIAGNOSIS — D509 Iron deficiency anemia, unspecified: Secondary | ICD-10-CM | POA: Diagnosis not present

## 2020-06-29 DIAGNOSIS — M85852 Other specified disorders of bone density and structure, left thigh: Secondary | ICD-10-CM | POA: Diagnosis not present

## 2020-06-29 LAB — CMP (CANCER CENTER ONLY)
ALT: 14 U/L (ref 0–44)
AST: 10 U/L — ABNORMAL LOW (ref 15–41)
Albumin: 4.6 g/dL (ref 3.5–5.0)
Alkaline Phosphatase: 66 U/L (ref 38–126)
Anion gap: 8 (ref 5–15)
BUN: 18 mg/dL (ref 6–20)
CO2: 30 mmol/L (ref 22–32)
Calcium: 9.7 mg/dL (ref 8.9–10.3)
Chloride: 103 mmol/L (ref 98–111)
Creatinine: 0.89 mg/dL (ref 0.44–1.00)
GFR, Estimated: >60 mL/min (ref >60)
Glucose, Bld: 83 mg/dL (ref 70–99)
Potassium: 4 mmol/L (ref 3.5–5.1)
Sodium: 141 mmol/L (ref 135–145)
Total Bilirubin: 0.6 mg/dL (ref 0.3–1.2)
Total Protein: 7.5 g/dL (ref 6.5–8.1)

## 2020-06-29 LAB — CBC WITH DIFFERENTIAL (CANCER CENTER ONLY)
Abs Immature Granulocytes: 0 10*3/uL (ref 0.00–0.07)
Basophils Absolute: 0 10*3/uL (ref 0.0–0.1)
Basophils Relative: 1 %
Eosinophils Absolute: 0.1 10*3/uL (ref 0.0–0.5)
Eosinophils Relative: 4 %
HCT: 39.1 % (ref 36.0–46.0)
Hemoglobin: 12.7 g/dL (ref 12.0–15.0)
Immature Granulocytes: 0 %
Lymphocytes Relative: 33 %
Lymphs Abs: 0.9 10*3/uL (ref 0.7–4.0)
MCH: 30.7 pg (ref 26.0–34.0)
MCHC: 32.5 g/dL (ref 30.0–36.0)
MCV: 94.4 fL (ref 80.0–100.0)
Monocytes Absolute: 0.2 10*3/uL (ref 0.1–1.0)
Monocytes Relative: 9 %
Neutro Abs: 1.5 10*3/uL — ABNORMAL LOW (ref 1.7–7.7)
Neutrophils Relative %: 53 %
Platelet Count: 172 10*3/uL (ref 150–400)
RBC: 4.14 MIL/uL (ref 3.87–5.11)
RDW: 13.3 % (ref 11.5–15.5)
WBC Count: 2.8 10*3/uL — ABNORMAL LOW (ref 4.0–10.5)
nRBC: 0 % (ref 0.0–0.2)

## 2020-06-29 LAB — IRON AND TIBC
Iron: 148 ug/dL — ABNORMAL HIGH (ref 41–142)
Saturation Ratios: 50 % (ref 21–57)
TIBC: 294 ug/dL (ref 236–444)
UIBC: 146 ug/dL (ref 120–384)

## 2020-06-29 LAB — FERRITIN: Ferritin: 89 ng/mL (ref 11–307)

## 2020-06-29 MED ORDER — SERTRALINE HCL 100 MG PO TABS: 100.0000 mg | ORAL_TABLET | Freq: Every day | ORAL | 2 refills | Status: DC

## 2020-06-29 MED ORDER — NERATINIB MALEATE 40 MG PO TABS: ORAL_TABLET | ORAL | 2 refills | Status: DC

## 2020-07-01 ENCOUNTER — Telehealth: Payer: Self-pay | Admitting: Hematology

## 2020-07-01 NOTE — Telephone Encounter (Signed)
Left message with follow-up appointment per 3/16 los. Gave option to call back to reschedule if needed.

## 2020-07-17 ENCOUNTER — Other Ambulatory Visit: Payer: BC Managed Care – PPO

## 2020-07-22 ENCOUNTER — Other Ambulatory Visit: Payer: Self-pay

## 2020-07-22 ENCOUNTER — Ambulatory Visit
Admission: RE | Admit: 2020-07-22 | Discharge: 2020-07-22 | Disposition: A | Discharge status: Home / Self Care | Payer: BC Managed Care – PPO | Source: Ambulatory Visit | Attending: Hematology | Admitting: Hematology

## 2020-07-22 DIAGNOSIS — C50811 Malignant neoplasm of overlapping sites of right female breast: Secondary | ICD-10-CM

## 2020-07-22 MED ORDER — GADOBENATE DIMEGLUMINE 529 MG/ML IV SOLN
13.0000 mL | Freq: Once | INTRAVENOUS | Status: AC | PRN
  Administered 2020-07-22: 13 mL via INTRAVENOUS

## 2020-08-03 ENCOUNTER — Telehealth: Payer: Self-pay

## 2020-08-03 NOTE — Telephone Encounter (Signed)
-----   Message from [name], MD sent at 08/03/2020  7:04 AM EDT ----- Please let pt know her MRI results. She may have read about the report, let me know if she has any questions. I recommend her to see PCP if needed to discuss back pain management. Thanks   [name]

## 2020-08-03 NOTE — Telephone Encounter (Signed)
Called no answer message left concerning most recent MRI encouraged to follow-up with PCP for back pain management and to call [facility] for any questions concerns or worsening sx

## 2020-08-26 ENCOUNTER — Other Ambulatory Visit: Payer: Self-pay | Admitting: Family Medicine

## 2020-08-26 DIAGNOSIS — R070 Pain in throat: Secondary | ICD-10-CM

## 2020-09-06 ENCOUNTER — Ambulatory Visit
Admission: RE | Admit: 2020-09-06 | Discharge: 2020-09-06 | Disposition: A | Discharge status: Home / Self Care | Payer: BC Managed Care – PPO | Source: Ambulatory Visit | Attending: Family Medicine | Admitting: Family Medicine

## 2020-09-06 DIAGNOSIS — R070 Pain in throat: Secondary | ICD-10-CM

## 2020-09-09 NOTE — Progress Notes (Signed)
[place]   Telephone:([phone] Fax:(336) [phone]   Clinic Follow up Note   Patient Care Team: [name], MD as PCP - General (Family Medicine) [name], MD as Consulting Physician (General Surgery) [name], MD as Consulting Physician (Hematology) [name], MD as Consulting Physician (Radiation Oncology) [name], MD as Consulting Physician (Obstetrics and Gynecology) [name], RN as Oncology Nurse Navigator [name], RN as Oncology Nurse Navigator [name], NP as Nurse Practitioner (Nurse Practitioner)  Date of Service:  09/14/2020  CHIEF COMPLAINT: F/u multifocal right breast cancer  SUMMARY OF ONCOLOGIC HISTORY: Oncology History Overview Note  Cancer Staging Cancer of overlapping sites of right female breast [facility]) Staging form: Breast, AJCC 8th Edition - Clinical stage from 05/28/2018: Stage IB (cT2, cN1, cM0, G3, ER+, PR+, HER2+) - Signed by [name], MD on 05/28/2018  Malignant neoplasm of lower-outer quadrant of right breast of female, estrogen receptor positive ([place]) Staging form: Breast, AJCC 8th Edition - Clinical stage from 05/28/2018: Stage IIB (cT2, cN1, cM0, G3, ER+, PR+, HER2-) - Unsigned     Cancer of overlapping sites of right female breast ([place])  05/14/2018 Mammogram   Right breast mammogram 05/14/18  IMPRESSION  There is a 1.0 x 0.5 x 1.2 cm lobular hypoechoic mass right breast 7 o'clock position 3 cm from nipple, a 0.8 x 0.6 x 1.2 cm lobular hypoechoic mass right breast 7 o'clock position 2 cm from the nipple, and a 0.9 x 0.5 x 0.8 cm lobular hypoechoic mass right breast 5 o'clock position 3 cm from nipple.  There is a 3.8 x 1.0 x 1.3 cm lobular hypoechoic mass right breast 5 o'clock position 2 cm from nipple.  Multiple thickened right axillary lymph nodes (approximately 15). Measuring up to 1.0 cm in thickness. Adenopathy corresponds with axillary palpable abnormality.  Indeterminate  round and punctate calcifications upper-outer quadrant right breast.Indeterminate round and punctate calcifications upper-outer quadrant right breast.   05/21/2018 Initial Biopsy   Diagnosis 05/21/18  1. Breast, right, needle core biopsy, axilla, 5 o'clock, ribbon clip - INVASIVE MAMMARY CARCINOMA. - MAMMARY CARCINOMA IN SITU. 2. Breast, right, needle core biopsy, 7 o'clock, coil clip - INVASIVE MAMMARY CARCINOMA. - MAMMARY CARCINOMA IN SITU. 3. Lymph node, needle/core biopsy, right axillary LN, hydromark - METASTATIC MAMMARY CARCINOMA.    05/21/2018 Receptors her2   1. PROGNOSTIC INDICATORS Results: The tumor cells are EQUIVOCAL for Her2 (2+);  HER2 **NEGATIVE** by FISH   Estrogen Receptor: 80%, POSITIVE, MODERATE STAINING INTENSITY Progesterone Receptor: 5%, POSITIVE, MODERATE-WEAK STAINING INTENSITY Proliferation Marker Ki67: 10%  3. PROGNOSTIC INDICATORS Results: The tumor cells are POSITIVE for Her2 (3+). Estrogen Receptor: 90%, POSITIVE, STRONG STAINING INTENSITY Progesterone Receptor: 50%, POSITIVE, MODERATE STAINING INTENSITY Proliferation Marker Ki67: 5%   05/26/2018 Initial Diagnosis   Malignant neoplasm of lower-inner quadrant of right breast of female, estrogen receptor positive ([place])   05/27/2018 Mammogram   Left breast mammogram 05/27/18  IMPRESSION: No mammographic evidence of LEFT breast malignancy.   05/28/2018 Cancer Staging   Staging form: Breast, AJCC 8th Edition - Clinical stage from 05/28/2018: Stage IB (cT2, cN1, cM0, G3, ER+, PR+, HER2+) - Signed by [name], MD on 05/28/2018   06/03/2018 Imaging   MR BREAST BILATERAL W WO CONTRAST INC CAD  IMPRESSION: 1. Large enhancing masses and non mass enhancement identified throughout the majority of the right breast. Additionally, there are smaller masses identified within the upper inner and lower inner right breast. Overall findings are  concerning for extensive malignancy throughout all 4 quadrants of the right  breast. 2. Extensive bulky lymphadenopathy identified within the right axilla. The matted appearance makes counting the number of abnormal lymph nodes difficult as they are immediately approximated to each other. 3. At least one abnormal appearing right internal mammary lymph node. 4. Nonenhancing lesion within the sternum with associated sternal marrow heterogeneity, raising the possibility of osseous metastatic disease within the sternum. 5. Two enhancing masses within the left breast as described above.     06/05/2018 PET scan   NM PET Image Initial (PI) Skull Base To Thigh  IMPRESSION: 1. Multiple borderline enlarged right axillary lymph nodes identified exhibiting mild to moderate increased uptake. Can not rule out nodal metastasis. Correlation with biopsy recommended. 2. Asymmetric increased uptake within the right breast corresponding to MRI abnormality. 3. Subcentimeter right retropectoral and right internal mammary lymph nodes are again identified and exhibit nonspecific, low level FDG uptake. 4. Noncalcified solid nodule within the lateral right lower lobe measures 9 mm without significant radiotracer uptake. 5. No hypermetabolic osseous lesions identified.     06/09/2018 - 11/03/2018 Chemotherapy   Neoadjuvant chemotherapy TCHP, 06/09/2018-09/22/18. Followed by Maintenance Herceptin/Perjeta q3weeks starting 10/14/18 to continue 1 year treatment. Stopped Herceptin/Perjeta on 11/03/18 due to multiple Positive LN of pathology.    06/10/2018 Pathology Results   Surgical pathology  Diagnosis 1. Breast, left, needle core biopsy, LOQ, barbell - MILD FIBROCYSTIC CHANGES. 2. Breast, left, needle core biopsy, UIQ cylinder - MILD FIBROCYSTIC CHANGES.    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 There were no  deleterious mutations in AIP, ALK, APC, ATM, AXIN2,BAP1,  BARD1, BLM, BMPR1A, BRCA1, BRCA2, BRIP1, CASR, CDC73, CDH1, CDK4, CDKN1B, CDKN1C, CDKN2A (p14ARF), CDKN2A (p16INK4a), CEBPA, CHEK2, CTNNA1, DICER1, DIS3L2, EGFR (c.2369C>T, p.Thr790Met variant only), EPCAM (Deletion/duplication testing only), FH, FLCN, GATA2, GPC3, GREM1 (Promoter region deletion/duplication testing only), HOXB13 (c.251G>A, p.Gly84Glu), HRAS, KIT, MAX, MEN1, MET, MITF (c.952G>A, p.Glu318Lys variant only), MLH1, MSH2, MSH3, MSH6, MUTYH, NBN, NF1, NF2, NTHL1, PDGFRA, PHOX2B, PMS2, POLD1, POLE, POT1, PRKAR1A, PTCH1, PTEN, RAD50, RAD51C, RAD51D, RB1, RECQL4, RET, RUNX1, SDHAF2, SDHA (sequence changes only), SDHB, SDHC, SDHD, SMAD4, SMARCA4, SMARCB1, SMARCE1, STK11, SUFU, TERC, TERT, TMEM127, TP53, TSC1, TSC2, VHL, WRN and WT1.  .   09/24/2018 Breast MRI   Breast MRI from [facility] on 09/24/18 Right breast: Signal void inferior right breast, posterior depth from biopsy marker clip. Interval decreased size of the right breast, and now symmetric to the left, compared to prior MRI. Resolution of abnormal nonmass enhancement without evidence of residual enhancing mass. No evidence of axillary or internal mammary lymphadenopathy is seen, markedly improved from prior. There is no abnormal skin, nipple, or pectoralis muscle enhancement.  Left breast: There are no suspicious enhancing masses or areas of non-mass enhancement. No evidence of axillary or internal mammary lymphadenopathy is seen. There is no abnormal skin, nipple, or pectoralis muscle enhancement.   10/09/2018 Imaging   CT chest W Contrast 10/09/18  IMPRESSION: 1. Stable 10 mm right lower lobe pulmonary nodule since 06/05/2018. Nodule is not substantially hypermetabolic on the PET-CT. Continued attention on follow-up recommended. 2. No findings to suggest metastatic disease in the thorax.   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 surrounding tissue - Biopsy clips identified - Margin status (see extended anterior margin below)  Invasive carcinoma: Positive anterior-inferior margin  Ductal carcinoma in situ: Positive anterior-inferior margin - Ancillary studies previously reported on [id number](5:00, ribbon clip) Estrogen receptor: Positive (80%) Progesterone receptor: Positive (10%) HER2 IHC: Equivocal (2+) HER2 FISH: Negative HER2/CEP17 ratio: 1.35 Mean HER2 copy number: 1.55 - Ancillary studies previously reported on [id number] (right axillary lymph node) Estrogen receptor: Positive (90%) Progesterone receptor: Positive (20%) HER2 IHC: Positive (3+) - Residual Cancer Burden  Tumor bed size: 41 mm x 20 mm Overall tumor cellularity: 10% Percentage in situ: 60% Number of involved lymph nodes: 7 Diameter of largest metastasis: 6 mm Residual Cancer Burden Score: 3.108 Residual Cancer Burden Class: RCB-II - Other findings: Atypical lobular hyperplasia and columnar cell change with microcalcifications; fibroadenomas with microcalcifications  B: Right axilla, targeted lymph node excision - Six of seven lymph nodes positive for carcinoma (6/7) - Size of largest tumor deposit: 6 mm - Biopsy site identified - Treatment effect: Present - Extracapsular extension: Absent  C: Right axilla, lymph node dissection - One of seven lymph nodes positive for carcinoma, macrometastasis, 6 mm (1/7) - Treatment effect: Absent - Extracapsular extension: Absent  D: Right breast, nipple core biopsy - Negative for carcinoma  E: Breast, right, anterior margin, excision - Microscopic foci of ductal carcinoma in situ - Margin: Negative, <1 mm    11/05/2018  Cancer Staging   Staging form: Breast, AJCC 8th Edition - Pathologic stage from 11/05/2018: No Stage Recommended (ypT1c, pN2a, cM0, G2, ER+, PR+, HER2-) - Signed by [name], MD on 11/23/2018   11/24/2018 - 08/24/2019 Chemotherapy   Due to positive LN on surgical pathology her maintance Herceptin/Perjeta was changed to [place] for about 14 cycles on 11/24/18. Completed on 08/24/19   12/24/2018 - 02/03/2019 Radiation Therapy   adjuvant radiation at [facility] on 12/24/18 and completed on 02/03/19.    01/23/2019 Imaging   CT Chest wo  IMPRESSION: 1. Stable right lower lobe pulmonary nodule. 2. No signs of new or suspicious finding since study of 10/09/2018, now status post right mastectomy, axillary dissection and breast reconstruction.   02/16/2019 -  Anti-estrogen oral therapy   -Monthly Zoladex injection started 12/19/18-02/22/20. Stopped after BSO surgery in 03/2020 -Letrozole 2.5 mg started 02/16/19    04/20/2019 Survivorship   SCP delivered by Cira Rue, NP    09/22/2019 - 09/2020 Chemotherapy   Nerlynx starting at 4 tablets (160 mg total) by mouth daily for 7 days, THEN 5 tablets (200 mg total) daily for 7 days, THEN 6 tablets (240 mg total) daily for 16 days, starting on 09/22/19. Stop mid June.    11/24/2019 Surgery   left prophylactic mastectomy and bilateral reconstruction at [facility] by Dr Dorann Ou 11/24/19.   03/21/2020 Surgery   XI ROBOTIC ASSISTED TOTAL HYSTERECTOMY WITH BILATERAL SALPINGO OOPHORECTOMY/CYSTOSCOPY, Pelvic Washing by Dr Emilia Beck  A. PERITONEAL, WASHING:    FINAL MICROSCOPIC DIAGNOSIS:  - Reactive mesothelial cells present    FINAL MICROSCOPIC DIAGNOSIS:   A. UTERUS, CERVIX, BILATERAL TUBE AND OVARIES:  - Uterus:       Endometrium: Inactive endometrium. No hyperplasia or malignancy.       Myometrium: Unremarkable. No malignancy.       Serosa: Unremarkable. No malignancy.  -  Cervix: Benign squamous and endocervical mucosa. No dysplasia or   malignancy.  - Bilateral ovaries: Unremarkable. No malignancy.  - Bilateral fallopian tubes: Unremarkable. No malignancy.        CURRENT THERAPY:  -Letrozole 2.5 mg started 02/16/19 -Nerlynx startingon 6/8/21titrated up to 268m over 3 weeks.[facility]   INTERVAL HISTORY:  MMALICIA BLASDELis here for a follow up of left breast cancer. She was last seen by me 3 months ago. She presents to the clinic alone. She notes she is doing well. She notes continues to be able to play her instrument well since her breast cancer. She notes she has had sore throat for weeks. Her UKoreathyroid was negative. She notes she does have post nasal drip. She is recovering from this well. She notes she is still on Nerlynx. She continues to tolerate well. She is also tolerating Letrozole well with mild hot flashes I reviewed her medication list with her. She rarely takes Xanax. She notes her back pain is still present but manageable     REVIEW OF SYSTEMS:   Constitutional: Denies fevers, chills or abnormal weight loss (+) Mild hot flashes  Eyes: Denies blurriness of vision Ears, nose, mouth, throat, and face: Denies mucositis or sore throat Respiratory: Denies cough, dyspnea or wheezes Cardiovascular: Denies palpitation, chest discomfort or lower extremity swelling Gastrointestinal:  Denies nausea, heartburn or change in bowel habits Skin: Denies abnormal skin rashes Lymphatics: Denies new lymphadenopathy or easy bruising Neurological:Denies numbness, tingling or new weaknesses Behavioral/Psych: Mood is stable, no new changes  All other systems were reviewed with the patient and are negative.  MEDICAL HISTORY:  Past Medical History:  Diagnosis Date  . Acid reflux   . Acute bronchitis   . Anemia    iron infusions  . Anxiety   . Asthma    rare  . Cancer [place]   . Depression   . Family history of brain cancer   . Family history of breast cancer   . Family history of prostate cancer   .  Migraine     SURGICAL HISTORY: Past Surgical History:  Procedure Laterality Date  . BREAST BIOPSY Right 05/23/2018   x3, malignant  . DILATION AND CURETTAGE OF UTERUS     2006 x1, 2009 x2  . LAPAROTOMY  2008  . PORTACATH PLACEMENT Left 06/06/2018   Procedure: INSERTION PORT-A-CATH POSSIBLE ULTRASOUND;  Surgeon: TJovita Kussmaul MD;  [place] [facility]  Service: General;  Laterality: Left;  . ROBOTIC ASSISTED TOTAL HYSTERECTOMY WITH BILATERAL SALPINGO OOPHERECTOMY Bilateral 03/21/2020   Procedure: XI ROBOTIC ASSISTED TOTAL HYSTERECTOMY WITH BILATERAL SALPINGO OOPHORECTOMY/CYSTOSCOPY,Pelvic Washing;  Surgeon: FAloha Gell MD;  [place]  Service: Gynecology;  Laterality: Bilateral;  Kim S. to RNFA confirmed on 03/03/20 CS    I have reviewed the social history and family history with the patient and they are unchanged from previous note.  ALLERGIES:  has No Known Allergies.  MEDICATIONS:  Current Outpatient Medications  Medication Sig Dispense Refill  . ALPRAZolam (XANAX) 0.5 MG tablet Take 0.25-0.5 mg by mouth at bedtime as needed for anxiety.     .Marland KitchenCALCIUM-MAGNESIUM-VITAMIN D PO Take 1 tablet by mouth daily.    . ferrous sulfate 325 (65 FE) MG tablet Take 325 mg by mouth daily with breakfast.    . ibuprofen (ADVIL) 800 MG tablet Take 1 tablet (800 mg total) by mouth every 8 (eight) hours as needed. 60 tablet 1  . letrozole (  FEMARA) 2.5 MG tablet Take 1 tablet (2.5 mg total) by mouth daily. 90 tablet 1  . melatonin 5 MG TABS Take 5 mg by mouth at bedtime.     . Neratinib Maleate (NERLYNX) 40 MG tablet TAKE 6 TABLETS BY MOUTH 1 TIME A DAY 180 tablet 2  . Polyethylene Glycol 3350 (MIRALAX PO) Take 17 g by mouth daily.     Marland Kitchen senna (SENOKOT) 8.6 MG tablet Take 1 tablet by mouth daily.    . sertraline (ZOLOFT) 100 MG tablet Take 1 tablet (100 mg total) by mouth daily. 90 tablet 2   No current facility-administered medications for this  visit.    PHYSICAL EXAMINATION: ECOG PERFORMANCE STATUS: 0 - Asymptomatic  Vitals:   09/14/20 1301  BP: 97/68  Pulse: (!) 54  Resp: 17  Temp: 97.8 F (36.6 C)  SpO2: 100%   Filed Weights   09/14/20 1301  Weight: 138 lb (62.6 kg)    GENERAL:alert, no distress and comfortable SKIN: skin color, texture, turgor are normal, no rashes or significant lesions EYES: normal, Conjunctiva are pink and non-injected, sclera clear  NECK: supple, thyroid normal size, non-tender, without nodularity LYMPH:  no palpable lymphadenopathy in the cervical, axillary  LUNGS: clear to auscultation and percussion with normal breathing effort HEART: regular rate & rhythm and no murmurs and no lower extremity edema ABDOMEN:abdomen soft, non-tender and normal bowel sounds Musculoskeletal:no cyanosis of digits and no clubbing  NEURO: alert & oriented x 3 with fluent speech, no focal motor/sensory deficits BREAST: s/p B/l mastectomy and reconstruction with implant in place: surgical incision healed well. No palpable mass, nodules or adenopathy bilaterally. Breast exam benign.   LABORATORY DATA:  I have reviewed the data as listed CBC Latest Ref Rng & Units 09/14/2020 06/29/2020 03/30/2020  WBC 4.0 - 10.5 K/uL 3.5(L) 2.8(L) 3.0(L)  Hemoglobin 12.0 - 15.0 g/dL 12.0 12.7 11.2(L)  Hematocrit 36.0 - 46.0 % 36.3 39.1 34.9(L)  Platelets 150 - 400 K/uL 182 172 159     CMP Latest Ref Rng & Units 09/14/2020 06/29/2020 03/30/2020  Glucose 70 - 99 mg/dL 86 83 85  BUN 6 - 20 mg/dL _0 Creatinine 0.44 - 1.00 mg/dL 0.81 0.89 0.81  Sodium 135 - 145 mmol/L 141 141 149(H)  Potassium 3.5 - 5.1 mmol/L 4.4 4.0 4.3  Chloride 98 - 111 mmol/L 104 103 110  CO2 22 - 32 mmol/L 29 30 32  Calcium 8.9 - 10.3 mg/dL 9.7 9.7 9.6  Total Protein 6.5 - 8.1 g/dL 7.2 7.5 7.1  Total Bilirubin 0.3 - 1.2 mg/dL 0.3 0.6 0.4  Alkaline Phos 38 - 126 U/L 64 66 63  AST 15 - 41 U/L 12(L) 10(L) 13(L)  ALT 0 - 44 U/L _1 RADIOGRAPHIC STUDIES: I have personally reviewed the radiological images as listed and agreed with the findings in the report. No results found.   ASSESSMENT & PLAN:  KERRA GUILFOIL is a 45 y.o. 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 -I started her on antiestrogen therapy with Letrozole in 02/2019(monthly Zoladex9/2020-02/2020). Tolerating well with manageable hot flashes and mood swings, which has improved with Zoloft. -To further reduce her risk of recurrence Itreated  her with anti-HER2 Nerlynxfor 1 years (09/22/19-mid June 2022).   -She underwentleft prophylactic mastectomy and bilateral reconstruction [facility] by Dr Dorann Ou 11/24/19.Developed cellulitis of breast 10 weeks after, now resolved.  -She continues to tolerate Nerlynx and Letrozole well. She is clinically doing well. Labs reviewed, WBC 3.5.  -She will continue Nerlynx 234m daily until mid June 2022 to complete one year therapy. Continue Letrozole 2.53mdaily. -F/u in 4 months.    2. Iron deficient Anemia, B12 deficiency  -Her 02/22/20 labs showed Hg 10.9, Feritin <4, iron 27, Sat ratio 7, B12 264. She has been symptomatic.  -She is currently on oral iron and B12. She was treated with 3 doses of Venofer 40065mn 02/2020.  -I discussed with IDA there is concern for blood loss. Prior to BSO she did not have period for 4 years while on Mirena. She denies obvious GI bleeding.   -She notes she is also vegetarian. I recommend she continue her oral iron and B12. -Her 06/2020 Colonoscopy/Upper  Endoscopy with Dr BraAlessandra Bevelsowed 1 benign polyp and gastritis, per patient. I will obtain the report.  -continue oral iron and B12   3.Genetics:[id number]+ -Genetic testingfrom [facility] done 05/2018 was positive for PALB2mut52mon. She has discovered a stronger maternal family history of prostate and breast cancerin her1st,2nd and 3rd degree relatives, Including her mother who was recently diagnosed. -We discussed the risk of future breast cancer from PALB2muta33mn. Given her familyhistory,she is s/p b/l mastectomy and reconstruction (10/21/18 and 11/24/19) and s/p BSO with hysterectomy (03/21/20). -While there is an increased risk for other cancers, there are no current guidelines for screening for these cancers, and at this time NCCN does not suggest a discussion of risk reducing salpingo-oophorectomy. -Her baseline 06/2020 colonoscopy/endoscopy was benign per patient.   4. 9mm RL31mung nodule on 06/05/18 PET. Biopsy attempted. Given stable for 2 years, this is likely benign.   5.Osteopenia -I reviewed AI such as letrozole can weaken her bone  -Her baseline DEXA from 07/21/19 shows osteopenia with lowest T-score -1.3 at left femur neck. Will monitor on AI with repeat scan every 2 years. -She will continue Calcium and Vit D along with weight bearing exercise.I have discussed the benfit of Zometa q6months76monthyears if bone density worsens.I reviewed side effects in detail and gave print out medication. She will think about it.  -will start later this year   6.Insomnia, Stress/Anxiety -Worsened with breast cancer diagnosis.  -Currently on Zoloft and uses Xanax only as needed. She has not needed Ambien.  7.COVID-19 positive infectionin 11/11/19 (mildly symptomatic) - She has recovered well  8. Left Lower back pain, Arthritis  -She notes for the past 2-3 months having this muscular pain that only occurs with certain movement.  -Exercise helps but does not resolve pain. MRI  from 07/22/20 showed arthritis.  -Has improved with PT.    Plan -Continue Nerlynx until mid June 2022. Continue Letrozole  -Echo with Dr BensimohJeffie Pollocknth -Lab and F/u in 4 months    No problem-specific Assessment & Plan notes found for this encounter.   No orders of the defined types were placed in this encounter.  All questions were answered. The patient knows to call the clinic with any problems, questions or concerns. No barriers to learning was detected. The total time spent in the appointment was 30 minutes.     Kadra Kohan FengTruitt Merle/2022   I, Amoya BeJoslyn Devoning as scribe for Shell Yandow FengTruitt Merle  I have reviewed the above documentation for accuracy and completeness, and I agree with the above.       

## 2020-09-14 ENCOUNTER — Encounter: Payer: Self-pay | Admitting: Hematology

## 2020-09-14 ENCOUNTER — Inpatient Hospital Stay: Payer: BC Managed Care – PPO

## 2020-09-14 ENCOUNTER — Other Ambulatory Visit: Payer: Self-pay

## 2020-09-14 ENCOUNTER — Inpatient Hospital Stay: Payer: BC Managed Care – PPO | Attending: Hematology | Admitting: Hematology

## 2020-09-14 VITALS — BP 97/68 | HR 54 | Temp 97.8°F | Resp 17 | Wt 138.0 lb

## 2020-09-14 DIAGNOSIS — C50811 Malignant neoplasm of overlapping sites of right female breast: Secondary | ICD-10-CM | POA: Diagnosis not present

## 2020-09-14 DIAGNOSIS — Z90710 Acquired absence of both cervix and uterus: Secondary | ICD-10-CM | POA: Diagnosis not present

## 2020-09-14 DIAGNOSIS — Z79899 Other long term (current) drug therapy: Secondary | ICD-10-CM | POA: Insufficient documentation

## 2020-09-14 DIAGNOSIS — Z170 Estrogen receptor positive status [ER+]: Secondary | ICD-10-CM

## 2020-09-14 DIAGNOSIS — D508 Other iron deficiency anemias: Secondary | ICD-10-CM

## 2020-09-14 DIAGNOSIS — Z90722 Acquired absence of ovaries, bilateral: Secondary | ICD-10-CM | POA: Insufficient documentation

## 2020-09-14 DIAGNOSIS — Z79811 Long term (current) use of aromatase inhibitors: Secondary | ICD-10-CM | POA: Insufficient documentation

## 2020-09-14 DIAGNOSIS — D509 Iron deficiency anemia, unspecified: Secondary | ICD-10-CM | POA: Diagnosis not present

## 2020-09-14 DIAGNOSIS — Z9013 Acquired absence of bilateral breasts and nipples: Secondary | ICD-10-CM | POA: Diagnosis not present

## 2020-09-14 DIAGNOSIS — M85852 Other specified disorders of bone density and structure, left thigh: Secondary | ICD-10-CM | POA: Insufficient documentation

## 2020-09-14 DIAGNOSIS — J45909 Unspecified asthma, uncomplicated: Secondary | ICD-10-CM | POA: Insufficient documentation

## 2020-09-14 LAB — CBC WITH DIFFERENTIAL (CANCER CENTER ONLY)
Abs Immature Granulocytes: 0.01 10*3/uL (ref 0.00–0.07)
Basophils Absolute: 0 10*3/uL (ref 0.0–0.1)
Basophils Relative: 1 %
Eosinophils Absolute: 0.1 10*3/uL (ref 0.0–0.5)
Eosinophils Relative: 4 %
HCT: 36.3 % (ref 36.0–46.0)
Hemoglobin: 12 g/dL (ref 12.0–15.0)
Immature Granulocytes: 0 %
Lymphocytes Relative: 33 %
Lymphs Abs: 1.2 10*3/uL (ref 0.7–4.0)
MCH: 31.5 pg (ref 26.0–34.0)
MCHC: 33.1 g/dL (ref 30.0–36.0)
MCV: 95.3 fL (ref 80.0–100.0)
Monocytes Absolute: 0.3 10*3/uL (ref 0.1–1.0)
Monocytes Relative: 7 %
Neutro Abs: 1.9 10*3/uL (ref 1.7–7.7)
Neutrophils Relative %: 55 %
Platelet Count: 182 10*3/uL (ref 150–400)
RBC: 3.81 MIL/uL — ABNORMAL LOW (ref 3.87–5.11)
RDW: 13.2 % (ref 11.5–15.5)
WBC Count: 3.5 10*3/uL — ABNORMAL LOW (ref 4.0–10.5)
nRBC: 0 % (ref 0.0–0.2)

## 2020-09-14 LAB — CMP (CANCER CENTER ONLY)
ALT: 16 U/L (ref 0–44)
AST: 12 U/L — ABNORMAL LOW (ref 15–41)
Albumin: 4.2 g/dL (ref 3.5–5.0)
Alkaline Phosphatase: 64 U/L (ref 38–126)
Anion gap: 8 (ref 5–15)
BUN: 14 mg/dL (ref 6–20)
CO2: 29 mmol/L (ref 22–32)
Calcium: 9.7 mg/dL (ref 8.9–10.3)
Chloride: 104 mmol/L (ref 98–111)
Creatinine: 0.81 mg/dL (ref 0.44–1.00)
GFR, Estimated: >60 mL/min (ref >60)
Glucose, Bld: 86 mg/dL (ref 70–99)
Potassium: 4.4 mmol/L (ref 3.5–5.1)
Sodium: 141 mmol/L (ref 135–145)
Total Bilirubin: 0.3 mg/dL (ref 0.3–1.2)
Total Protein: 7.2 g/dL (ref 6.5–8.1)

## 2020-09-14 LAB — IRON AND TIBC
Iron: 93 ug/dL (ref 41–142)
Saturation Ratios: 32 % (ref 21–57)
TIBC: 286 ug/dL (ref 236–444)
UIBC: 194 ug/dL (ref 120–384)

## 2020-09-14 LAB — FERRITIN: Ferritin: 71 ng/mL (ref 11–307)

## 2020-09-14 MED ORDER — LETROZOLE 2.5 MG PO TABS: 2.5000 mg | ORAL_TABLET | Freq: Every day | ORAL | 1 refills | Status: DC

## 2020-09-15 LAB — CANCER ANTIGEN 27.29: CA 27.29: 18.6 U/mL (ref 0.0–38.6)

## 2020-09-16 ENCOUNTER — Telehealth: Payer: Self-pay | Admitting: Hematology

## 2020-09-16 NOTE — Telephone Encounter (Signed)
Scheduled follow-up appointment per 6/1 los. Patient is aware.

## 2020-09-19 ENCOUNTER — Other Ambulatory Visit: Payer: Self-pay | Admitting: Hematology

## 2020-09-19 ENCOUNTER — Other Ambulatory Visit (HOSPITAL_COMMUNITY): Payer: Self-pay | Admitting: *Deleted

## 2020-09-19 DIAGNOSIS — C50811 Malignant neoplasm of overlapping sites of right female breast: Secondary | ICD-10-CM

## 2020-09-20 ENCOUNTER — Encounter: Payer: Self-pay | Admitting: Hematology

## 2020-09-21 ENCOUNTER — Encounter: Payer: Self-pay | Admitting: Hematology

## 2020-09-21 ENCOUNTER — Other Ambulatory Visit: Payer: Self-pay | Admitting: Hematology

## 2020-12-12 ENCOUNTER — Ambulatory Visit (HOSPITAL_COMMUNITY)
Admission: RE | Admit: 2020-12-12 | Discharge: 2020-12-12 | Disposition: A | Discharge status: Home / Self Care | Payer: BC Managed Care – PPO | Source: Ambulatory Visit | Attending: Internal Medicine | Admitting: Internal Medicine

## 2020-12-12 ENCOUNTER — Encounter (HOSPITAL_COMMUNITY): Payer: Self-pay | Admitting: Internal Medicine

## 2020-12-12 ENCOUNTER — Ambulatory Visit (HOSPITAL_BASED_OUTPATIENT_CLINIC_OR_DEPARTMENT_OTHER)
Admission: RE | Admit: 2020-12-12 | Discharge: 2020-12-12 | Disposition: A | Discharge status: Home / Self Care | Payer: BC Managed Care – PPO | Source: Ambulatory Visit | Attending: Internal Medicine | Admitting: Internal Medicine

## 2020-12-12 ENCOUNTER — Other Ambulatory Visit: Payer: Self-pay

## 2020-12-12 VITALS — BP 96/60 | HR 62 | Wt 140.8 lb

## 2020-12-12 DIAGNOSIS — Z0189 Encounter for other specified special examinations: Secondary | ICD-10-CM

## 2020-12-12 DIAGNOSIS — Z79899 Other long term (current) drug therapy: Secondary | ICD-10-CM | POA: Diagnosis not present

## 2020-12-12 DIAGNOSIS — C50811 Malignant neoplasm of overlapping sites of right female breast: Secondary | ICD-10-CM | POA: Diagnosis present

## 2020-12-12 DIAGNOSIS — Z803 Family history of malignant neoplasm of breast: Secondary | ICD-10-CM | POA: Diagnosis not present

## 2020-12-12 DIAGNOSIS — Z9013 Acquired absence of bilateral breasts and nipples: Secondary | ICD-10-CM | POA: Diagnosis not present

## 2020-12-12 DIAGNOSIS — Z923 Personal history of irradiation: Secondary | ICD-10-CM | POA: Diagnosis not present

## 2020-12-12 LAB — ECHOCARDIOGRAM COMPLETE
AR max vel: 2.75 cm2
AV Area VTI: 2.98 cm2
AV Area mean vel: 2.75 cm2
AV Mean grad: 2 mmHg
AV Peak grad: 4.2 mmHg
Ao pk vel: 1.02 m/s
Area-P 1/2: 2.82 cm2
MV VTI: 2.59 cm2
S' Lateral: 3.2 cm

## 2020-12-12 NOTE — Progress Notes (Signed)
  Echocardiogram 2D Echocardiogram has been performed.  Kim [name] 12/12/2020, 12:04 PM

## 2020-12-12 NOTE — Progress Notes (Signed)
Cardio-Oncology Clinic Note   Date:  12/12/2020   ID:  DACODA SPALLONE, DOB 04/16/1976, MRN [id number]  Location: Home  Provider location: [facility] Type of Visit: New patient  PCP:  [name], MD  Cardiologist:  None Primary HF: Nasim Habeeb  Chief Complaint:  Breast cancer   History of Present Illness:  Ms. Kim [name] is a 45 y/o female with h/o GERD, asthma and right breast CA referred by Dr. Annamaria Boots for enrollment in the Cardio-oncology clinic for echo surveillance during chemotherapy.    Oncology history:  - Diagnosed in 2/20 with Cancer of overlapping sites of her right breast, multifocal invasive ductal carcinoma, cT2N1Mx with indeterminate R lung nodule, ER+/PR+, HER2- in breast, HER2(+) in node, Grade II-III, ypT1cN2a - Completed Neoadjuvant TCHP and underwent right mastectomy. She completed adjuvant radiation at [facility] through [facility]. She completed maintenance therapy with Herceptin/Perjeta then 14 cycles of adjuvant Kadcyla.  in 2020 - Letrozole started in 02/2019  -To further reduce risk of recurrence started on anti-HER2 treatment with Nerlynx on 09/22/19. Completed 09/2020.  - Underwent left prophylactic mastectomy and bilateral reconstruction at [facility] by Dr Dorann Ou 11/24/19 - She continues to tolerate Letrozole well.   She is a Chartered certified accountant at [employer]. No h/o any cardiology issues. Remains active. No CP or SOB.  Echo today 12/12/20 EF 60-65%  Echo 06/24/19 EF 60-65% GLS -26.9%  Echo 03/18/19 EF 60-65% GLS -72.5% Normal diastolic function.  Echo 12/10/18 EF 60-65% GLS -22.2%  Echo 05/30/18 EF 55-60% GLS - 27.1% Echo 08/28/18 EF 55-60% GLS -21.8%  Lisbeth Ply denies symptoms worrisome for COVID 19.   Past Medical History:  Diagnosis Date   Acid reflux    Acute bronchitis    Anemia    iron infusions   Anxiety    Asthma    rare   Cancer ([place])    Depression    Family history of brain cancer    Family history of breast  cancer    Family history of prostate cancer    Migraine    Past Surgical History:  Procedure Laterality Date   BREAST BIOPSY Right 05/23/2018   x3, malignant   DILATION AND CURETTAGE OF UTERUS     2006 x1, 2009 x2   LAPAROTOMY  2008   PORTACATH PLACEMENT Left 06/06/2018   Procedure: INSERTION PORT-A-CATH POSSIBLE ULTRASOUND;  Surgeon: [name], MD;  Location: [place];  Service: General;  Laterality: Left;   ROBOTIC ASSISTED TOTAL HYSTERECTOMY WITH BILATERAL SALPINGO OOPHERECTOMY Bilateral 03/21/2020   Procedure: XI ROBOTIC ASSISTED TOTAL HYSTERECTOMY WITH BILATERAL SALPINGO OOPHORECTOMY/CYSTOSCOPY,Pelvic Washing;  Surgeon: [name], MD;  Location: [place];  Service: Gynecology;  Laterality: Bilateral;  [name] to RNFA confirmed on 03/03/20 CS     Current Outpatient Medications  Medication Sig Dispense Refill   ALPRAZolam (XANAX) 0.5 MG tablet Take 0.25-0.5 mg by mouth at bedtime as needed for anxiety.      CALCIUM-MAGNESIUM-VITAMIN D PO Take 1 tablet by mouth daily.     ibuprofen (ADVIL) 800 MG tablet Take 1 tablet (800 mg total) by mouth every 8 (eight) hours as needed. 60 tablet 1   letrozole (FEMARA) 2.5 MG tablet Take 1 tablet (2.5 mg total) by mouth daily. 90 tablet 1   melatonin 5 MG TABS Take 5 mg by mouth at bedtime.      Polyethylene Glycol 3350 (MIRALAX PO) Take 17 g by mouth daily. As needed  senna (SENOKOT) 8.6 MG tablet Take 1 tablet by mouth daily.     sertraline (ZOLOFT) 100 MG tablet Take 1 tablet (100 mg total) by mouth daily. 90 tablet 2   No current facility-administered medications for this encounter.    Allergies:   Patient has no known allergies.   Social History:  The patient  reports that she has never smoked. She has never used smokeless tobacco. She reports that she does not currently use alcohol. She reports that she does not use drugs.   Family History:  The patient's family history includes Breast  cancer in her mother and another family member; Breast cancer (age of onset: 29) in her maternal great-grandmother; Cancer in her maternal aunt and mother; Cancer (age of onset: 85) in her sister; Cancer - Other in an other family member; Hyperlipidemia in her father; Prostate cancer (age of onset: 61) in her maternal uncle; Prostate cancer (age of onset: 73) in her maternal grandfather; Skin cancer in her maternal grandmother.   ROS:  Please see the history of present illness.   All other systems are personally reviewed and negative.   Vitals:   12/12/20 1206  BP: 96/60  Pulse: 62  SpO2: 98%     Exam:   General:  Well appearing. No resp difficulty HEENT: normal Neck: supple. no JVD. Carotids 2+ bilat; no bruits. No lymphadenopathy or thryomegaly appreciated. Cor: PMI nondisplaced. Regular rate & rhythm. No rubs, gallops or murmurs. Lungs: clear Abdomen: soft, nontender, nondistended. No hepatosplenomegaly. No bruits or masses. Good bowel sounds. Extremities: no cyanosis, clubbing, rash, edema Neuro: alert & orientedx3, cranial nerves grossly intact. moves all 4 extremities w/o difficulty. Affect pleasant  ECG: NSR 60 No ST-T wave abnormalities. Personally reviewed   Recent Labs: 09/14/2020: ALT 16; BUN 14; Creatinine 0.81; Hemoglobin 12.0; Platelet Count 182; Potassium 4.4; Sodium 141  Personally reviewed   Wt Readings from Last 3 Encounters:  12/12/20 63.9 kg (140 lb 12.8 oz)  09/14/20 62.6 kg (138 lb)  06/29/20 62.1 kg (137 lb)      ASSESSMENT AND PLAN:  1.  R Breast CA  - doing well. Has completed Kadcyla in 2020 and XRT - Recently treated with Nerlynx completed 6/22 - I reviewed echos personally. EF and Doppler parameters stable. No evidence of chemo-induce cardiotoxicity     Signed, Glori Bickers, MD  12/12/2020 12:28 PM  [facility] [place] [address] [facility] and [place] [address] [phone]  (office) [phone] (fax)

## 2020-12-12 NOTE — Patient Instructions (Signed)
EKG done today.  No Labs done today.   No medication changes were made. Please continue all current medications as prescribed.  Your physician recommends that you schedule a follow-up appointment as needed  If you have any questions or concerns before your next appointment please send [place] a message through [place] or call our office at [phone].    TO LEAVE A MESSAGE FOR THE NURSE SELECT OPTION 2, PLEASE LEAVE A MESSAGE INCLUDING: YOUR NAME DATE OF BIRTH CALL BACK NUMBER REASON FOR CALL**this is important as we prioritize the call backs  YOU WILL RECEIVE A CALL BACK THE SAME DAY AS LONG AS YOU CALL BEFORE 4:00 PM   Do the following things EVERYDAY: Weigh yourself in the morning before breakfast. Write it down and keep it in a log. Take your medicines as prescribed Eat low salt foods--Limit salt (sodium) to 2000 mg per day.  Stay as active as you can everyday Limit all fluids for the day to less than 2 liters   At the [facility], you and your health needs are our priority. As part of our continuing mission to provide you with exceptional heart care, we have created designated Provider Care Teams. These Care Teams include your primary Cardiologist (physician) and Advanced Practice Providers (APPs- Physician Assistants and Nurse Practitioners) who all work together to provide you with the care you need, when you need it.   You may see any of the following providers on your designated Care Team at your next follow up: Dr Glori Bickers Dr Haynes Kerns, NP Lyda Jester, [place] Audry Riles, PharmD   Please be sure to bring in all your medications bottles to every appointment.

## 2020-12-12 NOTE — Addendum Note (Signed)
Encounter addended by: Shonna Chock, CMA on: [id number] 12:34 PM  Actions taken: Clinical Note Signed

## 2021-01-16 ENCOUNTER — Inpatient Hospital Stay: Payer: BC Managed Care – PPO | Attending: Hematology | Admitting: Hematology

## 2021-01-16 ENCOUNTER — Other Ambulatory Visit: Payer: Self-pay

## 2021-01-16 ENCOUNTER — Inpatient Hospital Stay: Payer: BC Managed Care – PPO

## 2021-01-16 ENCOUNTER — Encounter: Payer: Self-pay | Admitting: Hematology

## 2021-01-16 VITALS — BP 100/65 | HR 59 | Temp 98.2°F | Resp 18 | Ht 69.0 in | Wt 138.5 lb

## 2021-01-16 DIAGNOSIS — D508 Other iron deficiency anemias: Secondary | ICD-10-CM

## 2021-01-16 DIAGNOSIS — Z79811 Long term (current) use of aromatase inhibitors: Secondary | ICD-10-CM | POA: Insufficient documentation

## 2021-01-16 DIAGNOSIS — D509 Iron deficiency anemia, unspecified: Secondary | ICD-10-CM | POA: Diagnosis not present

## 2021-01-16 DIAGNOSIS — M85852 Other specified disorders of bone density and structure, left thigh: Secondary | ICD-10-CM | POA: Insufficient documentation

## 2021-01-16 DIAGNOSIS — C50811 Malignant neoplasm of overlapping sites of right female breast: Secondary | ICD-10-CM | POA: Diagnosis not present

## 2021-01-16 DIAGNOSIS — Z170 Estrogen receptor positive status [ER+]: Secondary | ICD-10-CM | POA: Diagnosis not present

## 2021-01-16 LAB — CBC WITH DIFFERENTIAL (CANCER CENTER ONLY)
Abs Immature Granulocytes: 0 10*3/uL (ref 0.00–0.07)
Basophils Absolute: 0 10*3/uL (ref 0.0–0.1)
Basophils Relative: 2 %
Eosinophils Absolute: 0.1 10*3/uL (ref 0.0–0.5)
Eosinophils Relative: 5 %
HCT: 38.5 % (ref 36.0–46.0)
Hemoglobin: 13 g/dL (ref 12.0–15.0)
Immature Granulocytes: 0 %
Lymphocytes Relative: 38 %
Lymphs Abs: 0.9 10*3/uL (ref 0.7–4.0)
MCH: 31.2 pg (ref 26.0–34.0)
MCHC: 33.8 g/dL (ref 30.0–36.0)
MCV: 92.3 fL (ref 80.0–100.0)
Monocytes Absolute: 0.2 10*3/uL (ref 0.1–1.0)
Monocytes Relative: 8 %
Neutro Abs: 1.1 10*3/uL — ABNORMAL LOW (ref 1.7–7.7)
Neutrophils Relative %: 47 %
Platelet Count: 149 10*3/uL — ABNORMAL LOW (ref 150–400)
RBC: 4.17 MIL/uL (ref 3.87–5.11)
RDW: 12.6 % (ref 11.5–15.5)
WBC Count: 2.4 10*3/uL — ABNORMAL LOW (ref 4.0–10.5)
nRBC: 0 % (ref 0.0–0.2)

## 2021-01-16 LAB — CMP (CANCER CENTER ONLY)
ALT: 15 U/L (ref 0–44)
AST: 11 U/L — ABNORMAL LOW (ref 15–41)
Albumin: 4.6 g/dL (ref 3.5–5.0)
Alkaline Phosphatase: 67 U/L (ref 38–126)
Anion gap: 11 (ref 5–15)
BUN: 13 mg/dL (ref 6–20)
CO2: 27 mmol/L (ref 22–32)
Calcium: 9.8 mg/dL (ref 8.9–10.3)
Chloride: 103 mmol/L (ref 98–111)
Creatinine: 0.84 mg/dL (ref 0.44–1.00)
GFR, Estimated: >60 mL/min (ref >60)
Glucose, Bld: 89 mg/dL (ref 70–99)
Potassium: 3.9 mmol/L (ref 3.5–5.1)
Sodium: 141 mmol/L (ref 135–145)
Total Bilirubin: 0.4 mg/dL (ref 0.3–1.2)
Total Protein: 7.3 g/dL (ref 6.5–8.1)

## 2021-01-16 LAB — IRON AND TIBC
Iron: 94 ug/dL (ref 41–142)
Saturation Ratios: 34 % (ref 21–57)
TIBC: 274 ug/dL (ref 236–444)
UIBC: 180 ug/dL (ref 120–384)

## 2021-01-16 LAB — FERRITIN: Ferritin: 57 ng/mL (ref 11–307)

## 2021-01-16 MED ORDER — EXEMESTANE 25 MG PO TABS: 25.0000 mg | ORAL_TABLET | Freq: Every day | ORAL | 3 refills | Status: DC

## 2021-01-16 NOTE — Progress Notes (Signed)
[place]   [phone] [phone]) [phone]   Clinic Follow up Note   Patient Care Team: [name], MD as PCP - General (Family Medicine) [name], MD as Consulting Physician (General Surgery) [name], MD as Consulting Physician (Hematology) [name], MD as Consulting Physician (Radiation Oncology) [name], MD as Consulting Physician (Obstetrics and Gynecology) [name], RN as Oncology Nurse Navigator [name], RN as Oncology Nurse Navigator [name], NP as Nurse Practitioner (Nurse Practitioner)  Date of Service:  01/16/2021  CHIEF COMPLAINT: f/u of multifocal right breast cancer  CURRENT THERAPY:  -Letrozole 2.5 mg started 02/16/19   ASSESSMENT & PLAN:  Kim [name] is a 45 y.o. female with   1. 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 She was treated with Neoadjuvant TCHP, right mastectomy, adjuvant radiation at [facility], and maintenance therapy with Herceptin/Perjeta then 14 cycles of adjuvant Kadcyla.   -she started Letrozole in 02/2019 (monthly Zoladex 12/2018-02/2020). Tolerating well with manageable hot flashes and mood swings, which has improved with Zoloft.  -To further reduce her risk of recurrence I treated her with anti-HER2 Nerlynx for 1 year (09/22/19-mid June 2022).   -She underwent left prophylactic mastectomy and bilateral reconstruction at [facility] by Dr Dorann Ou 11/24/19.  -she is having joint stiffness in her fingers, likely related to letrozole. I discussed option of switching letrozole to exemestane. She is agreeable. I prescribed for her today. -labs reviewed, CBC WNL. Other labs pending. We will call her with any abnormal results. -we will plan for restaging chest CT w/o contrast in  04/2021. I will see her shortly after.   2. Iron deficient Anemia   -Her 02/22/20 labs showed Hg 10.9, Feritin <4, iron 27, B12 264. -She is currently on oral iron and B12. She was treated with 3 doses of Venofer 424m in 02/2020.   -She notes she is vegan. -per patient, her 06/2020 Colonoscopy/Upper Endoscopy with Dr BAlessandra Bevelswas benign. -resolved 06/2020   3. Genetics: PALB2 mutation+ -[facility]. She has discovered a stronger maternal family history of prostate and breast cancer in her 15st 2nd and 3rd degree relatives, Including her mother who was recently diagnosed.  -We discussed the risk of future breast cancer from [place]. Given her family history, she is s/p b/l mastectomy and reconstruction (10/21/18 and 11/24/19) and s/p BSO with hysterectomy (03/21/20).  -Her baseline 06/2020 colonoscopy/endoscopy was benign per patient.    4. 96mRLL lung nodule on 06/05/18 PET. Biopsy attempted. Given stable for 2 years, this is likely benign.  -plan to repeat CT chest one more time in 05/2021   5. Osteopenia  -I reviewed AI such as letrozole can weaken her bone  -Her baseline DEXA from 07/21/19 shows osteopenia with lowest T-score -1.3 at left femur neck. Will monitor on AI with repeat scan every 2 years.  -She will continue Calcium and Vit D along with weight bearing exercise.    6. Insomnia, Stress/Anxiety -Worsened with breast cancer diagnosis.  -Currently on Zoloft and uses Xanax only as needed. She has not needed Ambien.   7. COVID-19 positive infection in 11/11/19 (mildly symptomatic) - She has recovered well    8. Left Lower back pain, Arthritis  -lumbar spine MRI from 07/22/20 showed  arthritis.  -Has improved with PT, no further pain mentioned today.     Plan  -finish letrozole, then switch to exemestane. I prescribed today. -lab and chest CT w/o contrast and labs in mid-January 2023 -f/u a few days after CT   No  problem-specific Assessment & Plan notes found for this encounter.   SUMMARY OF ONCOLOGIC HISTORY: Oncology History Overview Note  Cancer Staging Cancer of overlapping sites of right female breast [facility]) Staging form: Breast, AJCC 8th Edition - Clinical stage from 05/28/2018: Stage IB (cT2, cN1, cM0, G3, ER+, PR+, HER2+) - Signed by [name], MD on 05/28/2018  Malignant neoplasm of lower-outer quadrant of right breast of female, estrogen receptor positive ([place]) Staging form: Breast, AJCC 8th Edition - Clinical stage from 05/28/2018: Stage IIB (cT2, cN1, cM0, G3, ER+, PR+, HER2-) - Unsigned     Cancer of overlapping sites of right female breast ([place])  05/14/2018 Mammogram   Right breast mammogram 05/14/18  IMPRESSION  There is a 1.0 x 0.5 x 1.2 cm lobular hypoechoic mass right breast 7 o'clock position 3 cm from nipple, a 0.8 x 0.6 x 1.2 cm lobular hypoechoic mass right breast 7 o'clock position 2 cm from the nipple, and a 0.9 x 0.5 x 0.8 cm lobular hypoechoic mass right breast 5 o'clock position 3 cm from nipple.   There is a 3.8 x 1.0 x 1.3 cm lobular hypoechoic mass right breast 5 o'clock position 2 cm from nipple.   Multiple thickened right axillary lymph nodes (approximately 15). Measuring up to 1.0 cm in thickness. Adenopathy corresponds with axillary palpable abnormality.  Indeterminate round and punctate calcifications upper-outer quadrant right breast.Indeterminate round and punctate calcifications upper-outer quadrant right breast.   05/21/2018 Initial Biopsy   Diagnosis 05/21/18  1. Breast, right, needle core biopsy, axilla, 5 o'clock, ribbon clip - INVASIVE MAMMARY CARCINOMA. - MAMMARY CARCINOMA IN SITU. 2. Breast, right, needle core biopsy, 7 o'clock, coil clip - INVASIVE MAMMARY CARCINOMA. - MAMMARY CARCINOMA IN SITU. 3. Lymph node, needle/core biopsy, right axillary LN, hydromark - METASTATIC MAMMARY CARCINOMA.    05/21/2018 Receptors her2   1. PROGNOSTIC  INDICATORS Results: The tumor cells are EQUIVOCAL for Her2 (2+);  HER2 **NEGATIVE** by FISH   Estrogen Receptor: 80%, POSITIVE, MODERATE STAINING INTENSITY Progesterone Receptor: 5%, POSITIVE, MODERATE-WEAK STAINING INTENSITY Proliferation Marker Ki67: 10%  3. PROGNOSTIC INDICATORS Results: The tumor cells are POSITIVE for Her2 (3+). Estrogen Receptor: 90%, POSITIVE, STRONG STAINING INTENSITY Progesterone Receptor: 50%, POSITIVE, MODERATE STAINING INTENSITY Proliferation Marker Ki67: 5%   05/26/2018 Initial Diagnosis   Malignant neoplasm of lower-inner quadrant of right breast of female, estrogen receptor positive ([place])   05/27/2018 Mammogram   Left breast mammogram 05/27/18  IMPRESSION: No mammographic evidence of LEFT breast malignancy.   05/28/2018 Cancer Staging   Staging form: Breast, AJCC 8th Edition - Clinical stage from 05/28/2018: Stage IB (cT2, cN1, cM0, G3, ER+, PR+, HER2+) - Signed by [name], MD on 05/28/2018   06/03/2018 Imaging   MR BREAST BILATERAL W WO CONTRAST INC CAD  IMPRESSION: 1. Large enhancing masses and non mass enhancement identified throughout the majority of the right breast. Additionally, there are smaller masses identified within the upper inner and lower inner right breast. Overall findings are concerning for extensive malignancy throughout all 4 quadrants of the right breast. 2. Extensive bulky lymphadenopathy identified within the right axilla. The matted appearance makes counting the number of abnormal lymph nodes difficult as they are immediately approximated to each other. 3. At  least one abnormal appearing right internal mammary lymph node. 4. Nonenhancing lesion within the sternum with associated sternal marrow heterogeneity, raising the possibility of osseous metastatic disease within the sternum. 5. Two enhancing masses within the left breast as described above.     06/05/2018 PET scan   NM PET Image Initial (PI) Skull Base To Thigh   IMPRESSION: 1. Multiple borderline enlarged right axillary lymph nodes identified exhibiting mild to moderate increased uptake. Can not rule out nodal metastasis. Correlation with biopsy recommended. 2. Asymmetric increased uptake within the right breast corresponding to MRI abnormality. 3. Subcentimeter right retropectoral and right internal mammary lymph nodes are again identified and exhibit nonspecific, low level FDG uptake. 4. Noncalcified solid nodule within the lateral right lower lobe measures 9 mm without significant radiotracer uptake. 5. No hypermetabolic osseous lesions identified.     06/09/2018 - 11/03/2018 Chemotherapy   Neoadjuvant chemotherapy TCHP, 06/09/2018-09/22/18. Followed by Maintenance Herceptin/Perjeta q3weeks starting 10/14/18 to continue 1 year treatment. Stopped Herceptin/Perjeta on 11/03/18 due to multiple Positive LN of pathology.    06/10/2018 Pathology Results   Surgical pathology  Diagnosis 1. Breast, left, needle core biopsy, LOQ, barbell - MILD FIBROCYSTIC CHANGES. 2. Breast, left, needle core biopsy, UIQ cylinder - MILD FIBROCYSTIC CHANGES.    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 There were no deleterious mutations in AIP, ALK, APC, ATM, AXIN2,BAP1,  BARD1, BLM, BMPR1A, BRCA1, BRCA2, BRIP1, CASR, CDC73, CDH1, CDK4, CDKN1B, CDKN1C, CDKN2A (p14ARF), CDKN2A (p16INK4a), CEBPA, CHEK2, CTNNA1, DICER1, DIS3L2, EGFR (c.2369C>T, p.Thr790Met variant only), EPCAM (Deletion/duplication testing only), FH, FLCN, GATA2, GPC3, GREM1 (Promoter region deletion/duplication testing only), HOXB13 (c.251G>A, p.Gly84Glu), HRAS, KIT, MAX, MEN1, MET, MITF (c.952G>A, p.Glu318Lys variant only), MLH1, MSH2, MSH3, MSH6, MUTYH, NBN, NF1, NF2, NTHL1, PDGFRA, PHOX2B, PMS2, POLD1, POLE, POT1, PRKAR1A, PTCH1, PTEN,  RAD50, RAD51C, RAD51D, RB1, RECQL4, RET, RUNX1, SDHAF2, SDHA (sequence changes only), SDHB, SDHC, SDHD, SMAD4, SMARCA4, SMARCB1, SMARCE1, STK11, SUFU, TERC, TERT, TMEM127, TP53, TSC1, TSC2, VHL, WRN and WT1.  .   09/24/2018 Breast MRI   Breast MRI from [facility] on 09/24/18 Right breast: Signal void inferior right breast, posterior depth from biopsy marker clip. Interval decreased size of the right breast, and now symmetric to the left, compared to prior MRI. Resolution of abnormal nonmass enhancement without evidence of residual enhancing mass. No evidence of axillary or internal mammary lymphadenopathy is seen, markedly improved from prior. There is no abnormal skin, nipple, or pectoralis muscle enhancement.  Left breast: There are no suspicious enhancing masses or areas of non-mass enhancement. No evidence of axillary or internal mammary lymphadenopathy is seen. There is no abnormal skin, nipple, or pectoralis muscle enhancement.   10/09/2018 Imaging   CT chest W Contrast 10/09/18  IMPRESSION: 1. Stable 10 mm right lower lobe pulmonary nodule since 06/05/2018. Nodule is not substantially hypermetabolic on the PET-CT. Continued attention on follow-up recommended. 2. No findings to suggest metastatic disease in the thorax.   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 and surrounding tissue - Biopsy clips identified - Margin status (  see extended anterior margin below)  Invasive carcinoma: Positive anterior-inferior margin  Ductal carcinoma in situ: Positive anterior-inferior margin - Ancillary studies previously reported on [id number](5:00, ribbon clip) Estrogen receptor: Positive  (80%) Progesterone receptor: Positive (10%) HER2 IHC: Equivocal (2+) HER2 FISH: Negative HER2/CEP17 ratio: 1.35 Mean HER2 copy number: 1.55 - Ancillary studies previously reported on [id number] (right axillary lymph node) Estrogen receptor: Positive (90%) Progesterone receptor: Positive (20%) HER2 IHC: Positive (3+) - Residual Cancer Burden  Tumor bed size: 41 mm x 20 mm Overall tumor cellularity: 10% Percentage in situ: 60% Number of involved lymph nodes: 7 Diameter of largest metastasis: 6 mm Residual Cancer Burden Score: 3.108 Residual Cancer Burden Class: RCB-II - Other findings: Atypical lobular hyperplasia and columnar cell change with microcalcifications; fibroadenomas with microcalcifications  B: Right axilla, targeted lymph node excision - Six of seven lymph nodes positive for carcinoma (6/7) - Size of largest tumor deposit: 6 mm - Biopsy site identified - Treatment effect: Present - Extracapsular extension: Absent  C: Right axilla, lymph node dissection - One of seven lymph nodes positive for carcinoma, macrometastasis, 6 mm (1/7) - Treatment effect: Absent - Extracapsular extension: Absent  D: Right breast, nipple core biopsy - Negative for carcinoma  E: Breast, right, anterior margin, excision - Microscopic foci of ductal carcinoma in situ - Margin: Negative, <1 mm    11/05/2018 Cancer Staging   Staging form: Breast, AJCC 8th Edition - Pathologic stage from 11/05/2018: No Stage Recommended (ypT1c, pN2a, cM0, G2, ER+, PR+, HER2-) - Signed by [name], MD on 11/23/2018   11/24/2018 - 08/24/2019 Chemotherapy   Due to positive LN on surgical pathology her maintance Herceptin/Perjeta was changed to [place] for about 14 cycles on 11/24/18. Completed on 08/24/19   12/24/2018 - 02/03/2019 Radiation Therapy   adjuvant radiation at [facility] on 12/24/18 and completed on 02/03/19.    01/23/2019 Imaging   CT Chest wo  IMPRESSION: 1. Stable right lower  lobe pulmonary nodule. 2. No signs of new or suspicious finding since study of 10/09/2018, now status post right mastectomy, axillary dissection and breast reconstruction.   02/16/2019 -  Anti-estrogen oral therapy   -Monthly Zoladex injection started 12/19/18-02/22/20. Stopped after BSO surgery in 03/2020 -Letrozole 2.5 mg started 02/16/19    04/20/2019 Survivorship   SCP delivered by Cira Rue, NP    09/22/2019 - 09/2020 Chemotherapy   Nerlynx starting at 4 tablets (160 mg total) by mouth daily for 7 days, THEN 5 tablets (200 mg total) daily for 7 days, THEN 6 tablets (240 mg total) daily for 16 days, starting on 09/22/19. Stop mid June.    11/24/2019 Surgery   left prophylactic mastectomy and bilateral reconstruction at [facility] by Dr Dorann Ou 11/24/19.   03/21/2020 Surgery   XI ROBOTIC ASSISTED TOTAL HYSTERECTOMY WITH BILATERAL SALPINGO OOPHORECTOMY/CYSTOSCOPY, Pelvic Washing by Dr Emilia Beck  A. PERITONEAL, WASHING:    FINAL MICROSCOPIC DIAGNOSIS:  - Reactive mesothelial cells present    FINAL MICROSCOPIC DIAGNOSIS:   A. UTERUS, CERVIX, BILATERAL TUBE AND OVARIES:  - Uterus:       Endometrium: Inactive endometrium. No hyperplasia or malignancy.       Myometrium: Unremarkable. No malignancy.       Serosa: Unremarkable. No malignancy.  - Cervix: Benign squamous and endocervical mucosa. No dysplasia or  malignancy.  - Bilateral ovaries: Unremarkable. No malignancy.  - Bilateral fallopian tubes: Unremarkable. No malignancy.        INTERVAL HISTORY:  Kim [name] is here  for a follow up of breast cancer. She was last seen by me on 09/14/20. She presents to the clinic alone. She reports she is doing well, and she had a good summer. She notes they took a vacation to [place] for 7 weeks. She reports some joint aching in her fingers at both sides.   All other systems were reviewed with the patient and are negative.  MEDICAL HISTORY:  Past Medical History:  Diagnosis Date    Acid reflux    Acute bronchitis    Anemia    iron infusions   Anxiety    Asthma    rare   Cancer ([place])    Depression    Family history of brain cancer    Family history of breast cancer    Family history of prostate cancer    Migraine     SURGICAL HISTORY: Past Surgical History:  Procedure Laterality Date   BREAST BIOPSY Right 05/23/2018   x3, malignant   DILATION AND CURETTAGE OF UTERUS     2006 x1, 2009 x2   LAPAROTOMY  2008   PORTACATH PLACEMENT Left 06/06/2018   Procedure: INSERTION PORT-A-CATH POSSIBLE ULTRASOUND;  Surgeon: [name], MD;  Location: [facility];  Service: General;  Laterality: Left;   ROBOTIC ASSISTED TOTAL HYSTERECTOMY WITH BILATERAL SALPINGO OOPHERECTOMY Bilateral 03/21/2020   Procedure: XI ROBOTIC ASSISTED TOTAL HYSTERECTOMY WITH BILATERAL SALPINGO OOPHORECTOMY/CYSTOSCOPY,Pelvic Washing;  Surgeon: [name], MD;  Location: [place];  Service: Gynecology;  Laterality: Bilateral;  [name] to RNFA confirmed on 03/03/20 CS    I have reviewed the social history and family history with the patient and they are unchanged from previous note.  ALLERGIES:  has No Known Allergies.  MEDICATIONS:  Current Outpatient Medications  Medication Sig Dispense Refill   exemestane (AROMASIN) 25 MG tablet Take 1 tablet (25 mg total) by mouth daily after breakfast. 30 tablet 3   ALPRAZolam (XANAX) 0.5 MG tablet Take 0.25-0.5 mg by mouth at bedtime as needed for anxiety.      CALCIUM-MAGNESIUM-VITAMIN D PO Take 1 tablet by mouth daily.     ibuprofen (ADVIL) 800 MG tablet Take 1 tablet (800 mg total) by mouth every 8 (eight) hours as needed. 60 tablet 1   melatonin 5 MG TABS Take 5 mg by mouth at bedtime.      Polyethylene Glycol 3350 (MIRALAX PO) Take 17 g by mouth daily. As needed     senna (SENOKOT) 8.6 MG tablet Take 1 tablet by mouth daily.     sertraline (ZOLOFT) 100 MG tablet Take 1 tablet (100 mg total) by mouth daily. 90  tablet 2   No current facility-administered medications for this visit.    PHYSICAL EXAMINATION: ECOG PERFORMANCE STATUS: 0 - Asymptomatic  Vitals:   01/16/21 0827  BP: 100/65  Pulse: (!) 59  Resp: 18  Temp: 98.2 F (36.8 C)  SpO2: 100%   Wt Readings from Last 3 Encounters:  01/16/21 138 lb 8 oz (62.8 kg)  12/12/20 140 lb 12.8 oz (63.9 kg)  09/14/20 138 lb (62.6 kg)     GENERAL:alert, no distress and comfortable SKIN: skin color, texture, turgor are normal, no rashes or significant lesions EYES: normal, Conjunctiva are pink and non-injected, sclera clear  NECK: supple, thyroid normal size, non-tender, without nodularity LYMPH:  no palpable lymphadenopathy in the cervical, axillary  LUNGS: clear to auscultation and percussion with normal breathing effort HEART: regular rate & rhythm and no murmurs and no  lower extremity edema ABDOMEN:abdomen soft, non-tender and normal bowel sounds Musculoskeletal:no cyanosis of digits and no clubbing  NEURO: alert & oriented x 3 with fluent speech, no focal motor/sensory deficits BREAST: No palpable mass, nodules or adenopathy bilaterally. Breast exam benign.   LABORATORY DATA:  I have reviewed the data as listed CBC Latest Ref Rng & Units 01/16/2021 09/14/2020 06/29/2020  WBC 4.0 - 10.5 K/uL 2.4(L) 3.5(L) 2.8(L)  Hemoglobin 12.0 - 15.0 g/dL 13.0 12.0 12.7  Hematocrit 36.0 - 46.0 % 38.5 36.3 39.1  Platelets 150 - 400 K/uL 149(L) 182 172     CMP Latest Ref Rng & Units 01/16/2021 09/14/2020 06/29/2020  Glucose 70 - 99 mg/dL 89 86 83  BUN 6 - 20 mg/dL '13 14 18  ' Creatinine 0.44 - 1.00 mg/dL 0.84 0.81 0.89  Sodium 135 - 145 mmol/L 141 141 141  Potassium 3.5 - 5.1 mmol/L 3.9 4.4 4.0  Chloride 98 - 111 mmol/L 103 104 103  CO2 22 - 32 mmol/L '27 29 30  ' Calcium 8.9 - 10.3 mg/dL 9.8 9.7 9.7  Total Protein 6.5 - 8.1 g/dL 7.3 7.2 7.5  Total Bilirubin 0.3 - 1.2 mg/dL 0.4 0.3 0.6  Alkaline Phos 38 - 126 U/L 67 64 66  AST 15 - 41 U/L 11(L) 12(L)  10(L)  ALT 0 - 44 U/L '15 16 14      ' RADIOGRAPHIC STUDIES: I have personally reviewed the radiological images as listed and agreed with the findings in the report. No results found.    Orders Placed This Encounter  Procedures   CT Chest Wo Contrast    Standing Status:   Future    Standing Expiration Date:   01/16/2022    Order Specific Question:   Is patient pregnant?    Answer:   No    Order Specific Question:   Preferred imaging location?    Answer:   [facility]   All questions were answered. The patient knows to call the clinic with any problems, questions or concerns. No barriers to learning was detected. The total time spent in the appointment was 30 minutes.     [name], MD 01/16/2021   I, Wilburn Mylar, am acting as scribe for [name], MD.   I have reviewed the above documentation for accuracy and completeness, and I agree with the above.

## 2021-01-17 LAB — CANCER ANTIGEN 27.29: CA 27.29: 16.2 U/mL (ref 0.0–38.6)

## 2021-03-06 ENCOUNTER — Encounter: Payer: Self-pay | Admitting: Hematology

## 2021-03-23 ENCOUNTER — Telehealth: Payer: Self-pay | Admitting: Hematology

## 2021-03-23 NOTE — Telephone Encounter (Signed)
Rescheduled upcoming appointment due to provider's breast clinic. Patient is aware of changes. 

## 2021-04-22 ENCOUNTER — Other Ambulatory Visit: Payer: Self-pay | Admitting: Hematology

## 2021-04-28 ENCOUNTER — Other Ambulatory Visit: Payer: Self-pay

## 2021-04-28 DIAGNOSIS — D508 Other iron deficiency anemias: Secondary | ICD-10-CM

## 2021-05-01 ENCOUNTER — Ambulatory Visit (HOSPITAL_COMMUNITY)
Admission: RE | Admit: 2021-05-01 | Discharge: 2021-05-01 | Disposition: A | Discharge status: Home / Self Care | Payer: BC Managed Care – PPO | Source: Ambulatory Visit | Attending: Hematology | Admitting: Hematology

## 2021-05-01 ENCOUNTER — Other Ambulatory Visit: Payer: Self-pay

## 2021-05-01 ENCOUNTER — Inpatient Hospital Stay: Payer: BC Managed Care – PPO | Attending: Hematology

## 2021-05-01 DIAGNOSIS — G4700 Insomnia, unspecified: Secondary | ICD-10-CM | POA: Insufficient documentation

## 2021-05-01 DIAGNOSIS — F419 Anxiety disorder, unspecified: Secondary | ICD-10-CM | POA: Insufficient documentation

## 2021-05-01 DIAGNOSIS — C50811 Malignant neoplasm of overlapping sites of right female breast: Secondary | ICD-10-CM | POA: Diagnosis present

## 2021-05-01 DIAGNOSIS — Z1501 Genetic susceptibility to malignant neoplasm of breast: Secondary | ICD-10-CM | POA: Insufficient documentation

## 2021-05-01 DIAGNOSIS — Z170 Estrogen receptor positive status [ER+]: Secondary | ICD-10-CM | POA: Insufficient documentation

## 2021-05-01 DIAGNOSIS — Z79811 Long term (current) use of aromatase inhibitors: Secondary | ICD-10-CM | POA: Insufficient documentation

## 2021-05-01 DIAGNOSIS — Z1329 Encounter for screening for other suspected endocrine disorder: Secondary | ICD-10-CM | POA: Diagnosis not present

## 2021-05-01 DIAGNOSIS — Z1509 Genetic susceptibility to other malignant neoplasm: Secondary | ICD-10-CM | POA: Insufficient documentation

## 2021-05-01 DIAGNOSIS — M85852 Other specified disorders of bone density and structure, left thigh: Secondary | ICD-10-CM | POA: Insufficient documentation

## 2021-05-01 DIAGNOSIS — D508 Other iron deficiency anemias: Secondary | ICD-10-CM

## 2021-05-01 DIAGNOSIS — R911 Solitary pulmonary nodule: Secondary | ICD-10-CM | POA: Insufficient documentation

## 2021-05-01 DIAGNOSIS — D509 Iron deficiency anemia, unspecified: Secondary | ICD-10-CM | POA: Insufficient documentation

## 2021-05-01 DIAGNOSIS — F32A Depression, unspecified: Secondary | ICD-10-CM | POA: Insufficient documentation

## 2021-05-01 LAB — CBC WITH DIFFERENTIAL (CANCER CENTER ONLY)
Abs Immature Granulocytes: 0 10*3/uL (ref 0.00–0.07)
Basophils Absolute: 0 10*3/uL (ref 0.0–0.1)
Basophils Relative: 1 %
Eosinophils Absolute: 0.1 10*3/uL (ref 0.0–0.5)
Eosinophils Relative: 3 %
HCT: 38.3 % (ref 36.0–46.0)
Hemoglobin: 12.6 g/dL (ref 12.0–15.0)
Immature Granulocytes: 0 %
Lymphocytes Relative: 32 %
Lymphs Abs: 0.8 10*3/uL (ref 0.7–4.0)
MCH: 30.8 pg (ref 26.0–34.0)
MCHC: 32.9 g/dL (ref 30.0–36.0)
MCV: 93.6 fL (ref 80.0–100.0)
Monocytes Absolute: 0.2 10*3/uL (ref 0.1–1.0)
Monocytes Relative: 6 %
Neutro Abs: 1.5 10*3/uL — ABNORMAL LOW (ref 1.7–7.7)
Neutrophils Relative %: 58 %
Platelet Count: 156 10*3/uL (ref 150–400)
RBC: 4.09 MIL/uL (ref 3.87–5.11)
RDW: 13.1 % (ref 11.5–15.5)
WBC Count: 2.7 10*3/uL — ABNORMAL LOW (ref 4.0–10.5)
nRBC: 0 % (ref 0.0–0.2)

## 2021-05-01 LAB — CMP (CANCER CENTER ONLY)
ALT: 18 U/L (ref 0–44)
AST: 11 U/L — ABNORMAL LOW (ref 15–41)
Albumin: 4.6 g/dL (ref 3.5–5.0)
Alkaline Phosphatase: 65 U/L (ref 38–126)
Anion gap: 6 (ref 5–15)
BUN: 15 mg/dL (ref 6–20)
CO2: 31 mmol/L (ref 22–32)
Calcium: 10 mg/dL (ref 8.9–10.3)
Chloride: 104 mmol/L (ref 98–111)
Creatinine: 0.76 mg/dL (ref 0.44–1.00)
GFR, Estimated: >60 mL/min (ref >60)
Glucose, Bld: 73 mg/dL (ref 70–99)
Potassium: 4.1 mmol/L (ref 3.5–5.1)
Sodium: 141 mmol/L (ref 135–145)
Total Bilirubin: 0.6 mg/dL (ref 0.3–1.2)
Total Protein: 7.3 g/dL (ref 6.5–8.1)

## 2021-05-01 LAB — IRON AND IRON BINDING CAPACITY (CC-WL,HP ONLY)
Iron: 129 ug/dL (ref 28–170)
Saturation Ratios: 41 % — ABNORMAL HIGH (ref 10.4–31.8)
TIBC: 316 ug/dL (ref 250–450)
UIBC: 187 ug/dL (ref 148–442)

## 2021-05-01 LAB — FERRITIN: Ferritin: 31 ng/mL (ref 11–307)

## 2021-05-02 LAB — CANCER ANTIGEN 27.29: CA 27.29: 16 U/mL (ref 0.0–38.6)

## 2021-05-03 ENCOUNTER — Ambulatory Visit: Payer: BC Managed Care – PPO | Admitting: Hematology

## 2021-05-04 ENCOUNTER — Inpatient Hospital Stay: Payer: BC Managed Care – PPO | Admitting: Hematology

## 2021-05-04 ENCOUNTER — Encounter: Payer: Self-pay | Admitting: Hematology

## 2021-05-04 ENCOUNTER — Other Ambulatory Visit: Payer: Self-pay

## 2021-05-04 VITALS — BP 102/70 | HR 59 | Temp 98.0°F | Resp 18 | Wt 144.4 lb

## 2021-05-04 DIAGNOSIS — R5383 Other fatigue: Secondary | ICD-10-CM

## 2021-05-04 DIAGNOSIS — F32A Depression, unspecified: Secondary | ICD-10-CM | POA: Diagnosis not present

## 2021-05-04 DIAGNOSIS — E2839 Other primary ovarian failure: Secondary | ICD-10-CM | POA: Diagnosis not present

## 2021-05-04 DIAGNOSIS — C50811 Malignant neoplasm of overlapping sites of right female breast: Secondary | ICD-10-CM | POA: Diagnosis present

## 2021-05-04 DIAGNOSIS — Z79811 Long term (current) use of aromatase inhibitors: Secondary | ICD-10-CM | POA: Diagnosis not present

## 2021-05-04 DIAGNOSIS — G4700 Insomnia, unspecified: Secondary | ICD-10-CM | POA: Diagnosis not present

## 2021-05-04 DIAGNOSIS — R911 Solitary pulmonary nodule: Secondary | ICD-10-CM | POA: Diagnosis not present

## 2021-05-04 DIAGNOSIS — Z1509 Genetic susceptibility to other malignant neoplasm: Secondary | ICD-10-CM | POA: Diagnosis not present

## 2021-05-04 DIAGNOSIS — Z170 Estrogen receptor positive status [ER+]: Secondary | ICD-10-CM | POA: Diagnosis not present

## 2021-05-04 DIAGNOSIS — D509 Iron deficiency anemia, unspecified: Secondary | ICD-10-CM | POA: Diagnosis not present

## 2021-05-04 DIAGNOSIS — M85852 Other specified disorders of bone density and structure, left thigh: Secondary | ICD-10-CM | POA: Diagnosis not present

## 2021-05-04 DIAGNOSIS — Z1501 Genetic susceptibility to malignant neoplasm of breast: Secondary | ICD-10-CM | POA: Diagnosis not present

## 2021-05-04 DIAGNOSIS — F419 Anxiety disorder, unspecified: Secondary | ICD-10-CM | POA: Diagnosis not present

## 2021-05-04 LAB — TSH: TSH: 1.268 u[IU]/mL (ref 0.308–3.960)

## 2021-05-04 MED ORDER — SERTRALINE HCL 100 MG PO TABS: 100.0000 mg | ORAL_TABLET | Freq: Every day | ORAL | 2 refills | Status: DC

## 2021-05-04 NOTE — Progress Notes (Signed)
[place]   [phone] [phone]   Clinic Follow up Note   Patient Care Team: Kim Small, MD as PCP - General (Family Medicine) [name], MD as Consulting Physician (General Surgery) [name], MD as Consulting Physician (Hematology) [name], MD as Consulting Physician (Radiation Oncology) [name], MD as Consulting Physician (Obstetrics and Gynecology) [name], RN as Oncology Nurse Navigator [name], RN as Oncology Nurse Navigator [name], NP as Nurse Practitioner (Nurse Practitioner)  Date of Service:  05/04/2021  CHIEF COMPLAINT: f/u of multifocal right breast cancer  CURRENT THERAPY:  Antiestrogen therapy started 02/16/19 with Letrozole 2.5 mg, switched to Exemestane 01/2021  ASSESSMENT & PLAN:  Kim [name] is a 46 y.o. female with   1. 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 She was treated with Neoadjuvant TCHP, right mastectomy, adjuvant radiation at [facility] through [facility], and maintenance therapy with Herceptin/Perjeta then 14 cycles of adjuvant Kadcyla.   -she started Letrozole in 02/2019 (monthly Zoladex 12/2018-02/2020). Tolerating well with manageable hot flashes and mood swings, which has improved with Zoloft.  -To further reduce her risk of recurrence I treated her with anti-HER2 Nerlynx for 1 year (09/22/19-mid June 2022).   -She underwent left prophylactic mastectomy and bilateral reconstruction at [facility] by Dr Dorann Ou 11/24/19.  -we switched her to exemestane at her last visit on 01/16/21 due to joint stiffness. She is tolerating well overall aside from fatigue. -she is clinically doing well. Labs from 05/01/21 reviewed, overall no concern. CA 27.29 has been WNL. Physical exam was unremarkable.    2. Iron deficient Anemia   -Her 02/22/20 labs showed Hg 10.9, Feritin <4, iron 27, B12 264. -She is currently on oral iron and B12. She was treated with 3 doses of Venofer 487m in 02/2020.   -She notes she is vegan. -per patient, her 06/2020 Colonoscopy/Upper Endoscopy with Dr BAlessandra Bevelswas benign. -resolved 06/2020   3. 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 She has discovered a stronger maternal family history of prostate and breast cancer in her 162st 2nd and 3rd degree relatives, Including her mother who was recently diagnosed.  -We discussed the risk of future breast cancer from PFairchildsmutation. Given her family history, she is s/p b/l mastectomy and reconstruction (10/21/18 and 11/24/19) and s/p BSO with hysterectomy (03/21/20).  -Her baseline 06/2020 colonoscopy/endoscopy was benign per patient.    4. RLL lung nodule -first seen on 06/05/18 PET. Biopsy attempted.  -stable on CT chest 05/01/21, compatible with benign etiology. I reviewed with pt today -no more f/u scan needed    5. Osteopenia  -I reviewed AI such as letrozole can weaken her bone  -Her baseline DEXA from 07/21/19 shows osteopenia with lowest T-score -1.3 at left femur neck. Will plan for repeat in 07/2021. -She will continue Calcium and Vit D along with weight bearing exercise.    6. Insomnia, Stress/Anxiety -Worsened with breast cancer diagnosis.  -Currently on Zoloft and uses Xanax only as needed. She has not needed Ambien.   7. COVID-19 positive infection in 11/11/19 (mildly symptomatic) - She has recovered well   8. Left Lower back pain, Arthritis  -lumbar spine MRI from 07/22/20 showed arthritis.  -Has improved with PT and yoga     Plan  -  continue exemestane -I refilled her Zoloft -DEXA in 07/2021 -lab and f/u in 6 months -will add TSH to her lab draw from 1/16   No problem-specific Assessment & Plan notes found for this encounter.   SUMMARY OF  ONCOLOGIC HISTORY: Oncology History Overview Note  Cancer Staging Cancer of overlapping sites of right female breast [facility]) Staging form: Breast, AJCC 8th Edition - Clinical stage from 05/28/2018: Stage IB (cT2, cN1, cM0, G3, ER+, PR+, HER2+) - Signed by [name], MD on 05/28/2018  Malignant neoplasm of lower-outer quadrant of right breast of female, estrogen receptor positive ([place]) Staging form: Breast, AJCC 8th Edition - Clinical stage from 05/28/2018: Stage IIB (cT2, cN1, cM0, G3, ER+, PR+, HER2-) - Unsigned     Cancer of overlapping sites of right female breast ([place])  05/14/2018 Mammogram   Right breast mammogram 05/14/18  IMPRESSION  There is a 1.0 x 0.5 x 1.2 cm lobular hypoechoic mass right breast 7 o'clock position 3 cm from nipple, a 0.8 x 0.6 x 1.2 cm lobular hypoechoic mass right breast 7 o'clock position 2 cm from the nipple, and a 0.9 x 0.5 x 0.8 cm lobular hypoechoic mass right breast 5 o'clock position 3 cm from nipple.   There is a 3.8 x 1.0 x 1.3 cm lobular hypoechoic mass right breast 5 o'clock position 2 cm from nipple.   Multiple thickened right axillary lymph nodes (approximately 15). Measuring up to 1.0 cm in thickness. Adenopathy corresponds with axillary palpable abnormality.  Indeterminate round and punctate calcifications upper-outer quadrant right breast.Indeterminate round and punctate calcifications upper-outer quadrant right breast.   05/21/2018 Initial Biopsy   Diagnosis 05/21/18  1. Breast, right, needle core biopsy, axilla, 5 o'clock, ribbon clip - INVASIVE MAMMARY CARCINOMA. - MAMMARY CARCINOMA IN SITU. 2. Breast, right, needle core biopsy, 7 o'clock, coil clip - INVASIVE MAMMARY CARCINOMA. - MAMMARY CARCINOMA IN SITU. 3. Lymph node, needle/core biopsy, right axillary LN, hydromark - METASTATIC MAMMARY CARCINOMA.    05/21/2018 Receptors her2   1. PROGNOSTIC INDICATORS Results: The tumor cells are EQUIVOCAL for Her2 (2+);  HER2 **NEGATIVE** by  FISH   Estrogen Receptor: 80%, POSITIVE, MODERATE STAINING INTENSITY Progesterone Receptor: 5%, POSITIVE, MODERATE-WEAK STAINING INTENSITY Proliferation Marker Ki67: 10%  3. PROGNOSTIC INDICATORS Results: The tumor cells are POSITIVE for Her2 (3+). Estrogen Receptor: 90%, POSITIVE, STRONG STAINING INTENSITY Progesterone Receptor: 50%, POSITIVE, MODERATE STAINING INTENSITY Proliferation Marker Ki67: 5%   05/26/2018 Initial Diagnosis   Malignant neoplasm of lower-inner quadrant of right breast of female, estrogen receptor positive ([place])   05/27/2018 Mammogram   Left breast mammogram 05/27/18  IMPRESSION: No mammographic evidence of LEFT breast malignancy.   05/28/2018 Cancer Staging   Staging form: Breast, AJCC 8th Edition - Clinical stage from 05/28/2018: Stage IB (cT2, cN1, cM0, G3, ER+, PR+, HER2+) - Signed by [name], MD on 05/28/2018    06/03/2018 Imaging   MR BREAST BILATERAL W WO CONTRAST INC CAD  IMPRESSION: 1. Large enhancing masses and non mass enhancement identified throughout the majority of the right breast. Additionally, there are smaller masses identified within the upper inner and lower inner right breast. Overall findings are concerning for extensive malignancy throughout all 4 quadrants of the right breast. 2. Extensive bulky lymphadenopathy identified within the right axilla. The matted appearance makes counting the number of abnormal lymph nodes difficult as they are immediately approximated to each other. 3. At least one abnormal appearing right internal mammary lymph node. 4. Nonenhancing lesion within the sternum with associated sternal  marrow heterogeneity, raising the possibility of osseous metastatic disease within the sternum. 5. Two enhancing masses within the left breast as described above.     06/05/2018 PET scan   NM PET Image Initial (PI) Skull Base To Thigh  IMPRESSION: 1. Multiple borderline enlarged right axillary lymph nodes identified  exhibiting mild to moderate increased uptake. Can not rule out nodal metastasis. Correlation with biopsy recommended. 2. Asymmetric increased uptake within the right breast corresponding to MRI abnormality. 3. Subcentimeter right retropectoral and right internal mammary lymph nodes are again identified and exhibit nonspecific, low level FDG uptake. 4. Noncalcified solid nodule within the lateral right lower lobe measures 9 mm without significant radiotracer uptake. 5. No hypermetabolic osseous lesions identified.     06/09/2018 - 11/03/2018 Chemotherapy   Neoadjuvant chemotherapy TCHP, 06/09/2018-09/22/18. Followed by Maintenance Herceptin/Perjeta q3weeks starting 10/14/18 to continue 1 year treatment. Stopped Herceptin/Perjeta on 11/03/18 due to multiple Positive LN of pathology.    06/10/2018 Pathology Results   Surgical pathology  Diagnosis 1. Breast, left, needle core biopsy, LOQ, barbell - MILD FIBROCYSTIC CHANGES. 2. Breast, left, needle core biopsy, UIQ cylinder - MILD FIBROCYSTIC CHANGES.    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 There were no deleterious mutations in AIP, ALK, APC, ATM, AXIN2,BAP1,  BARD1, BLM, BMPR1A, BRCA1, BRCA2, BRIP1, CASR, CDC73, CDH1, CDK4, CDKN1B, CDKN1C, CDKN2A (p14ARF), CDKN2A (p16INK4a), CEBPA, CHEK2, CTNNA1, DICER1, DIS3L2, EGFR (c.2369C>T, p.Thr790Met variant only), EPCAM (Deletion/duplication testing only), FH, FLCN, GATA2, GPC3, GREM1 (Promoter region deletion/duplication testing only), HOXB13 (c.251G>A, p.Gly84Glu), HRAS, KIT, MAX, MEN1, MET, MITF (c.952G>A, p.Glu318Lys variant only), MLH1, MSH2, MSH3, MSH6, MUTYH, NBN, NF1, NF2, NTHL1, PDGFRA, PHOX2B, PMS2, POLD1, POLE, POT1, PRKAR1A, PTCH1, PTEN, RAD50, RAD51C, RAD51D, RB1, RECQL4, RET, RUNX1, SDHAF2, SDHA (sequence changes only),  SDHB, SDHC, SDHD, SMAD4, SMARCA4, SMARCB1, SMARCE1, STK11, SUFU, TERC, TERT, TMEM127, TP53, TSC1, TSC2, VHL, WRN and WT1.  .   09/24/2018 Breast MRI   Breast MRI from [facility] on 09/24/18 Right breast: Signal void inferior right breast, posterior depth from biopsy marker clip. Interval decreased size of the right breast, and now symmetric to the left, compared to prior MRI. Resolution of abnormal nonmass enhancement without evidence of residual enhancing mass. No evidence of axillary or internal mammary lymphadenopathy is seen, markedly improved from prior. There is no abnormal skin, nipple, or pectoralis muscle enhancement.  Left breast: There are no suspicious enhancing masses or areas of non-mass enhancement. No evidence of axillary or internal mammary lymphadenopathy is seen. There is no abnormal skin, nipple, or pectoralis muscle enhancement.   10/09/2018 Imaging   CT chest W Contrast 10/09/18  IMPRESSION: 1. Stable 10 mm right lower lobe pulmonary nodule since 06/05/2018. Nodule is not substantially hypermetabolic on the PET-CT. Continued attention on follow-up recommended. 2. No findings to suggest metastatic disease in the thorax.   10/21/2018 Surgery   Right Maste[facility] at [facility] on 10/21/18    10/21/2018 Pathology Results   Final Diagnosis A: Breast, right, nipple-sparing mastectomy - Multifocal residual invasive ductal carcinoma in the tumor bed (see comment and synoptic report) - Tumor size: 13 mm, 10 mm and <1 mm - Ductal carcinoma in situ, grade 2, solid and cribriform types in tumor bed and surrounding tissue - Biopsy clips identified - Margin status (see extended anterior margin below)  Invasive carcinoma: Positive anterior-inferior margin  Ductal carcinoma in situ: Positive anterior-inferior  margin - Ancillary studies previously reported on [id number](5:00, ribbon clip) Estrogen receptor: Positive (80%) Progesterone receptor: Positive (10%) HER2 IHC: Equivocal (2+) HER2 FISH:  Negative HER2/CEP17 ratio: 1.35 Mean HER2 copy number: 1.55 - Ancillary studies previously reported on [id number] (right axillary lymph node) Estrogen receptor: Positive (90%) Progesterone receptor: Positive (20%) HER2 IHC: Positive (3+) - Residual Cancer Burden  Tumor bed size: 41 mm x 20 mm Overall tumor cellularity: 10% Percentage in situ: 60% Number of involved lymph nodes: 7 Diameter of largest metastasis: 6 mm Residual Cancer Burden Score: 3.108 Residual Cancer Burden Class: RCB-II - Other findings: Atypical lobular hyperplasia and columnar cell change with microcalcifications; fibroadenomas with microcalcifications  B: Right axilla, targeted lymph node excision - Six of seven lymph nodes positive for carcinoma (6/7) - Size of largest tumor deposit: 6 mm - Biopsy site identified - Treatment effect: Present - Extracapsular extension: Absent  C: Right axilla, lymph node dissection - One of seven lymph nodes positive for carcinoma, macrometastasis, 6 mm (1/7) - Treatment effect: Absent - Extracapsular extension: Absent  D: Right breast, nipple core biopsy - Negative for carcinoma  E: Breast, right, anterior margin, excision - Microscopic foci of ductal carcinoma in situ - Margin: Negative, <1 mm    11/05/2018 Cancer Staging   Staging form: Breast, AJCC 8th Edition - Pathologic stage from 11/05/2018: No Stage Recommended (ypT1c, pN2a, cM0, G2, ER+, PR+, HER2-) - Signed by [name], MD on 11/23/2018    11/24/2018 - 08/24/2019 Chemotherapy   Due to positive LN on surgical pathology her maintance Herceptin/Perjeta was changed to [place] for about 14 cycles on 11/24/18. Completed on 08/24/19   12/24/2018 - 02/03/2019 Radiation Therapy   adjuvant radiation at [facility] on 12/24/18 and completed on 02/03/19.    01/23/2019 Imaging   CT Chest wo  IMPRESSION: 1. Stable right lower lobe pulmonary nodule. 2. No signs of new or suspicious finding since study of  10/09/2018, now status post right mastectomy, axillary dissection and breast reconstruction.   02/16/2019 -  Anti-estrogen oral therapy   -Monthly Zoladex injection started 12/19/18-02/22/20. Stopped after BSO surgery in 03/2020 -Letrozole 2.5 mg started 02/16/19    04/20/2019 Survivorship   SCP delivered by Cira Rue, NP    09/22/2019 - 09/2020 Chemotherapy   Nerlynx starting at 4 tablets (160 mg total) by mouth daily for 7 days, THEN 5 tablets (200 mg total) daily for 7 days, THEN 6 tablets (240 mg total) daily for 16 days, starting on 09/22/19. Stop mid June.    11/24/2019 Surgery   left prophylactic mastectomy and bilateral reconstruction at [facility] by Dr Dorann Ou 11/24/19.   03/21/2020 Surgery   XI ROBOTIC ASSISTED TOTAL HYSTERECTOMY WITH BILATERAL SALPINGO OOPHORECTOMY/CYSTOSCOPY, Pelvic Washing by Dr Emilia Beck  A. PERITONEAL, WASHING:    FINAL MICROSCOPIC DIAGNOSIS:  - Reactive mesothelial cells present    FINAL MICROSCOPIC DIAGNOSIS:   A. UTERUS, CERVIX, BILATERAL TUBE AND OVARIES:  - Uterus:       Endometrium: Inactive endometrium. No hyperplasia or malignancy.       Myometrium: Unremarkable. No malignancy.       Serosa: Unremarkable. No malignancy.  - Cervix: Benign squamous and endocervical mucosa. No dysplasia or  malignancy.  - Bilateral ovaries: Unremarkable. No malignancy.  - Bilateral fallopian tubes: Unremarkable. No malignancy.     05/01/2021 Imaging   EXAM: CT CHEST WITHOUT CONTRAST  IMPRESSION: 1. Stable 9 x 7 mm peripheral right lower lobe pulmonary nodule. This is unchanged comparing back  to PET-CT 06/05/2018 when showed no substantial hypermetabolism. Given stability over greater than 2 years, this is compatible with benign etiology. 2. Stable appearance of hepatic cysts.      INTERVAL HISTORY:  Kim [name] is here for a follow up of breast cancer. She was last seen by me on 01/16/21. She presents to the clinic alone. She reports she is very  tired, physically and mentally. She notes she gets 7 hours of sleep a night, so it is not sleepiness. She feels it is more depression, similar to when her ferritin was low.   All other systems were reviewed with the patient and are negative.  MEDICAL HISTORY:  Past Medical History:  Diagnosis Date   Acid reflux    Acute bronchitis    Anemia    iron infusions   Anxiety    Asthma    rare   Cancer ([place])    Depression    Family history of brain cancer    Family history of breast cancer    Family history of prostate cancer    Migraine     SURGICAL HISTORY: Past Surgical History:  Procedure Laterality Date   BREAST BIOPSY Right 05/23/2018   x3, malignant   DILATION AND CURETTAGE OF UTERUS     2006 x1, 2009 x2   LAPAROTOMY  2008   PORTACATH PLACEMENT Left 06/06/2018   Procedure: INSERTION PORT-A-CATH POSSIBLE ULTRASOUND;  Surgeon: [name], MD;  Location: [place];  Service: General;  Laterality: Left;   ROBOTIC ASSISTED TOTAL HYSTERECTOMY WITH BILATERAL SALPINGO OOPHERECTOMY Bilateral 03/21/2020   Procedure: XI ROBOTIC ASSISTED TOTAL HYSTERECTOMY WITH BILATERAL SALPINGO OOPHORECTOMY/CYSTOSCOPY,Pelvic Washing;  Surgeon: [name], MD;  Location: [place];  Service: Gynecology;  Laterality: Bilateral;  [name] to RNFA confirmed on 03/03/20 CS    I have reviewed the social history and family history with the patient and they are unchanged from previous note.  ALLERGIES:  has No Known Allergies.  MEDICATIONS:  Current Outpatient Medications  Medication Sig Dispense Refill   ALPRAZolam (XANAX) 0.5 MG tablet Take 0.25-0.5 mg by mouth at bedtime as needed for anxiety.      CALCIUM-MAGNESIUM-VITAMIN D PO Take 1 tablet by mouth daily.     exemestane (AROMASIN) 25 MG tablet TAKE 1 TABLET (25 MG TOTAL) BY MOUTH DAILY AFTER BREAKFAST. 90 tablet 1   ibuprofen (ADVIL) 800 MG tablet Take 1 tablet (800 mg total) by mouth every 8 (eight) hours as  needed. 60 tablet 1   melatonin 5 MG TABS Take 5 mg by mouth at bedtime.      Polyethylene Glycol 3350 (MIRALAX PO) Take 17 g by mouth daily. As needed     senna (SENOKOT) 8.6 MG tablet Take 1 tablet by mouth daily.     sertraline (ZOLOFT) 100 MG tablet Take 1 tablet (100 mg total) by mouth daily. 90 tablet 2   No current facility-administered medications for this visit.    PHYSICAL EXAMINATION: ECOG PERFORMANCE STATUS: 0 - Asymptomatic  Vitals:   05/04/21 0821  BP: 102/70  Pulse: (!) 59  Resp: 18  Temp: 98 F (36.7 C)  SpO2: 99%   Wt Readings from Last 3 Encounters:  05/04/21 144 lb 7 oz (65.5 kg)  01/16/21 138 lb 8 oz (62.8 kg)  12/12/20 140 lb 12.8 oz (63.9 kg)     GENERAL:alert, no distress and comfortable SKIN: skin color, texture, turgor are normal, no rashes or significant lesions EYES: normal, Conjunctiva  are pink and non-injected, sclera clear  NECK: supple, thyroid normal size, non-tender, without nodularity LYMPH:  no palpable lymphadenopathy in the cervical, axillary  LUNGS: clear to auscultation and percussion with normal breathing effort HEART: regular rate & rhythm and no murmurs and no lower extremity edema ABDOMEN:abdomen soft, non-tender and normal bowel sounds Musculoskeletal:no cyanosis of digits and no clubbing  NEURO: alert & oriented x 3 with fluent speech, no focal motor/sensory deficits BREAST: No palpable mass, nodules or adenopathy bilaterally. Breast exam benign.   LABORATORY DATA:  I have reviewed the data as listed CBC Latest Ref Rng & Units 05/01/2021 01/16/2021 09/14/2020  WBC 4.0 - 10.5 K/uL 2.7(L) 2.4(L) 3.5(L)  Hemoglobin 12.0 - 15.0 g/dL 12.6 13.0 12.0  Hematocrit 36.0 - 46.0 % 38.3 38.5 36.3  Platelets 150 - 400 K/uL 156 149(L) 182     CMP Latest Ref Rng & Units 05/01/2021 01/16/2021 09/14/2020  Glucose 70 - 99 mg/dL 73 89 86  BUN 6 - 20 mg/dL _0 Creatinine 0.44 - 1.00 mg/dL 0.76 0.84 0.81  Sodium 135 - 145 mmol/L 141 141 141   Potassium 3.5 - 5.1 mmol/L 4.1 3.9 4.4  Chloride 98 - 111 mmol/L 104 103 104  CO2 22 - 32 mmol/L _1 Calcium 8.9 - 10.3 mg/dL 10.0 9.8 9.7  Total Protein 6.5 - 8.1 g/dL 7.3 7.3 7.2  Total Bilirubin 0.3 - 1.2 mg/dL 0.6 0.4 0.3  Alkaline Phos 38 - 126 U/L 65 67 64  AST 15 - 41 U/L 11(L) 11(L) 12(L)  ALT 0 - 44 U/L _2 RADIOGRAPHIC STUDIES: I have personally reviewed the radiological images as listed and agreed with the findings in the report. No results found.    Orders Placed This Encounter  Procedures   DG Bone Density    Standing Status:   Future    Standing Expiration Date:   05/04/2022    Order Specific Question:   Reason for Exam (SYMPTOM  OR DIAGNOSIS REQUIRED)    Answer:   screening    Order Specific Question:   Is the patient pregnant?    Answer:   No    Order Specific Question:   Preferred imaging location?    Answer:   [facility]    Order Specific Question:   Release to patient    Answer:   Immediate   All questions were answered. The patient knows to call the clinic with any problems, questions or concerns. No barriers to learning was detected. The total time spent in the appointment was 30 minutes.     [name], MD 05/04/2021   I, Wilburn Mylar, am acting as scribe for [name], MD.   I have reviewed the above documentation for accuracy and completeness, and I agree with the above.

## 2021-06-20 ENCOUNTER — Other Ambulatory Visit: Payer: Self-pay | Admitting: Hematology

## 2021-07-17 ENCOUNTER — Encounter: Payer: Self-pay | Admitting: Hematology

## 2021-08-31 ENCOUNTER — Ambulatory Visit
Admission: RE | Admit: 2021-08-31 | Discharge: 2021-08-31 | Disposition: A | Discharge status: Home / Self Care | Payer: BC Managed Care – PPO | Source: Ambulatory Visit | Attending: Hematology | Admitting: Hematology

## 2021-08-31 DIAGNOSIS — E2839 Other primary ovarian failure: Secondary | ICD-10-CM

## 2021-09-05 DIAGNOSIS — M8580 Other specified disorders of bone density and structure, unspecified site: Secondary | ICD-10-CM | POA: Insufficient documentation

## 2021-09-06 ENCOUNTER — Encounter: Payer: Self-pay | Admitting: Hematology

## 2021-09-06 ENCOUNTER — Inpatient Hospital Stay: Payer: BC Managed Care – PPO | Attending: Hematology | Admitting: Hematology

## 2021-09-06 DIAGNOSIS — Z170 Estrogen receptor positive status [ER+]: Secondary | ICD-10-CM

## 2021-09-06 DIAGNOSIS — M8580 Other specified disorders of bone density and structure, unspecified site: Secondary | ICD-10-CM | POA: Diagnosis not present

## 2021-09-06 DIAGNOSIS — C50811 Malignant neoplasm of overlapping sites of right female breast: Secondary | ICD-10-CM | POA: Diagnosis not present

## 2021-09-06 NOTE — Progress Notes (Addendum)
[place]   [phone]) [phone] [phone]) [phone]   Clinic Follow up Note   Patient Care Team: [name], MD as PCP - General (Family Medicine) [name], MD as Consulting Physician (General Surgery) [name], MD as Consulting Physician (Hematology) [name], MD as Consulting Physician (Radiation Oncology) [name], MD as Consulting Physician (Obstetrics and Gynecology) [name], RN as Oncology Nurse Navigator [name], RN as Oncology Nurse Navigator [name], NP as Nurse Practitioner (Nurse Practitioner)  Date of Service:  09/06/2021  I connected with Kim [name] on 09/06/2021 at 12:20 PM EDT by telephone visit and verified that I am speaking with the correct person using two identifiers.  I discussed the limitations, risks, security and privacy concerns of performing an evaluation and management service by telephone and the availability of in person appointments. I also discussed with the patient that there may be a patient responsible charge related to this service. The patient expressed understanding and agreed to proceed.   Other persons participating in the visit and their role in the encounter:  none  Patient's location:  home Provider's location:  my office  CHIEF COMPLAINT: review recent DEXA, f/u of multifocal right breast cancer  CURRENT THERAPY:  Antiestrogen therapy started 02/16/19 with Letrozole 2.5 mg, switched to Exemestane 01/2021  ASSESSMENT & PLAN:  Kim [name] is a 46 y.o. post-hysterectomy female with   1. Osteopenia  -I reviewed AI such as letrozole can weaken her bone  -baseline DEXA from 07/21/19 shows osteopenia with T-score -1.3 at left femur neck. -repeat DEXA 08/31/21 showed some worsening with T-score of -1.9, which is getting worse.  I discussed the results with her today. She has been taking over-the-counter calcium and vitamin D, and does weightbearing exercise regularly.  -I  recommended bisphosphonate therapy. I discussed oral Fosamax versus IV Zometa.  I recommended Zometa due to the benefit of reducing risk of bone metastasis in breast cancer patients.  I discussed the benefit and potential side effects, especially fatigue, bone pain, flulike symptoms, and jaw necrosis and dental care.  She is interested in Zometa; we will plan to begin at her next scheduled visit in 11/2021.  2. 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   -s/p prophylactic left mastectomy and b/l reconstruction 11/24/19 by Dr. Tera Mater [facility]). -she completed 1 year Nerlynx in 09/2020 -she started Letrozole in 02/2019 (monthly Zoladex 12/2018-02/2020). She developed worsening joint stiffness and switched to exemestane 01/16/21. Tolerating well overall. -Due to her worsening osteopenia, I also discussed the option of changing letrozole to tamoxifen.  This can be considered if she does not respond to Zometa.   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 She has discovered a stronger maternal family history of prostate and breast cancer in her 85st, 2nd and 3rd degree relatives, Including her mother who was recently diagnosed.  -We discussed the risk of future breast cancer from [place] mutation. Given her family history, she is s/p b/l mastectomy and reconstruction (10/21/18 and 11/24/19) and s/p BSO with hysterectomy (03/21/20).  -Her baseline 06/2020 colonoscopy/endoscopy was benign per patient.   4. Insomnia, Stress/Anxiety -Worsened with breast cancer diagnosis.  -Currently on Zoloft and uses Xanax only as needed.  She has not needed Ambien.   5. COVID-19 positive infection in  11/11/19 (mildly symptomatic) - She has recovered well    6. Left Lower back pain, Arthritis  -lumbar spine MRI from 07/22/20 showed arthritis.  -Has improved with PT and yoga   PLAN: -continue exemestane -will add Zometa infusion to next visit  -lab and f/u on 11/30/21 as scheduled   No problem-specific Assessment & Plan notes found for this encounter.   SUMMARY OF ONCOLOGIC HISTORY: Oncology History Overview Note  Cancer Staging Cancer of overlapping sites of right female breast [facility]) Staging form: Breast, AJCC 8th Edition - Clinical stage from 05/28/2018: Stage IB (cT2, cN1, cM0, G3, ER+, PR+, HER2+) - Signed by [name], MD on 05/28/2018  Malignant neoplasm of lower-outer quadrant of right breast of female, estrogen receptor positive ([place]) Staging form: Breast, AJCC 8th Edition - Clinical stage from 05/28/2018: Stage IIB (cT2, cN1, cM0, G3, ER+, PR+, HER2-) - Unsigned     Cancer of overlapping sites of right female breast ([place])  05/14/2018 Mammogram   Right breast mammogram 05/14/18  IMPRESSION  There is a 1.0 x 0.5 x 1.2 cm lobular hypoechoic mass right breast 7 o'clock position 3 cm from nipple, a 0.8 x 0.6 x 1.2 cm lobular hypoechoic mass right breast 7 o'clock position 2 cm from the nipple, and a 0.9 x 0.5 x 0.8 cm lobular hypoechoic mass right breast 5 o'clock position 3 cm from nipple.   There is a 3.8 x 1.0 x 1.3 cm lobular hypoechoic mass right breast 5 o'clock position 2 cm from nipple.   Multiple thickened right axillary lymph nodes (approximately 15). Measuring up to 1.0 cm in thickness. Adenopathy corresponds with axillary palpable abnormality.  Indeterminate round and punctate calcifications upper-outer quadrant right breast.Indeterminate round and punctate calcifications upper-outer quadrant right breast.    05/21/2018 Initial Biopsy   Diagnosis 05/21/18  1. Breast, right, needle core biopsy, axilla, 5 o'clock, ribbon clip - INVASIVE MAMMARY CARCINOMA. -  MAMMARY CARCINOMA IN SITU. 2. Breast, right, needle core biopsy, 7 o'clock, coil clip - INVASIVE MAMMARY CARCINOMA. - MAMMARY CARCINOMA IN SITU. 3. Lymph node, needle/core biopsy, right axillary LN, hydromark - METASTATIC MAMMARY CARCINOMA.     05/21/2018 Receptors her2   1. PROGNOSTIC INDICATORS Results: The tumor cells are EQUIVOCAL for Her2 (2+);  HER2 **NEGATIVE** by FISH   Estrogen Receptor: 80%, POSITIVE, MODERATE STAINING INTENSITY Progesterone Receptor: 5%, POSITIVE, MODERATE-WEAK STAINING INTENSITY Proliferation Marker Ki67: 10%  3. PROGNOSTIC INDICATORS Results: The tumor cells are POSITIVE for Her2 (3+). Estrogen Receptor: 90%, POSITIVE, STRONG STAINING INTENSITY Progesterone Receptor: 50%, POSITIVE, MODERATE STAINING INTENSITY Proliferation Marker Ki67: 5%    05/26/2018 Initial Diagnosis   Malignant neoplasm of lower-inner quadrant of right breast of female, estrogen receptor positive ([place])    05/27/2018 Mammogram   Left breast mammogram 05/27/18  IMPRESSION: No mammographic evidence of LEFT breast malignancy.    05/28/2018 Cancer Staging   Staging form: Breast, AJCC 8th Edition - Clinical stage from 05/28/2018: Stage IB (cT2, cN1, cM0, G3, ER+, PR+, HER2+) - Signed by [name], MD on 05/28/2018    06/03/2018 Imaging   MR BREAST BILATERAL W WO CONTRAST INC CAD  IMPRESSION: 1. Large enhancing masses and non mass enhancement identified throughout the majority of the right breast. Additionally, there are smaller masses identified within the upper inner and lower inner right breast. Overall findings are concerning for extensive malignancy throughout all 4 quadrants of the right breast. 2. Extensive bulky lymphadenopathy  identified within the right axilla. The matted appearance makes counting the number of abnormal lymph nodes difficult as they are immediately approximated to each other. 3. At least one abnormal appearing right internal mammary lymph node. 4.  Nonenhancing lesion within the sternum with associated sternal marrow heterogeneity, raising the possibility of osseous metastatic disease within the sternum. 5. Two enhancing masses within the left breast as described above.     06/05/2018 PET scan   NM PET Image Initial (PI) Skull Base To Thigh  IMPRESSION: 1. Multiple borderline enlarged right axillary lymph nodes identified exhibiting mild to moderate increased uptake. Can not rule out nodal metastasis. Correlation with biopsy recommended. 2. Asymmetric increased uptake within the right breast corresponding to MRI abnormality. 3. Subcentimeter right retropectoral and right internal mammary lymph nodes are again identified and exhibit nonspecific, low level FDG uptake. 4. Noncalcified solid nodule within the lateral right lower lobe measures 9 mm without significant radiotracer uptake. 5. No hypermetabolic osseous lesions identified.     06/09/2018 - 11/03/2018 Chemotherapy   Neoadjuvant chemotherapy TCHP, 06/09/2018-09/22/18. Followed by Maintenance Herceptin/Perjeta q3weeks starting 10/14/18 to continue 1 year treatment. Stopped Herceptin/Perjeta on 11/03/18 due to multiple Positive LN of pathology.    06/10/2018 Pathology Results   Surgical pathology  Diagnosis 1. Breast, left, needle core biopsy, LOQ, barbell - MILD FIBROCYSTIC CHANGES. 2. Breast, left, needle core biopsy, UIQ cylinder - MILD FIBROCYSTIC CHANGES.     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 There were no deleterious mutations in AIP, ALK, APC, ATM, AXIN2,BAP1,  BARD1, BLM, BMPR1A, BRCA1, BRCA2, BRIP1, CASR, CDC73, CDH1, CDK4, CDKN1B, CDKN1C, CDKN2A (p14ARF), CDKN2A (p16INK4a), CEBPA, CHEK2, CTNNA1, DICER1, DIS3L2, EGFR (c.2369C>T, p.Thr790Met variant only), EPCAM (Deletion/duplication testing  only), FH, FLCN, GATA2, GPC3, GREM1 (Promoter region deletion/duplication testing only), HOXB13 (c.251G>A, p.Gly84Glu), HRAS, KIT, MAX, MEN1, MET, MITF (c.952G>A, p.Glu318Lys variant only), MLH1, MSH2, MSH3, MSH6, MUTYH, NBN, NF1, NF2, NTHL1, PDGFRA, PHOX2B, PMS2, POLD1, POLE, POT1, PRKAR1A, PTCH1, PTEN, RAD50, RAD51C, RAD51D, RB1, RECQL4, RET, RUNX1, SDHAF2, SDHA (sequence changes only), SDHB, SDHC, SDHD, SMAD4, SMARCA4, SMARCB1, SMARCE1, STK11, SUFU, TERC, TERT, TMEM127, TP53, TSC1, TSC2, VHL, WRN and WT1.  .    09/24/2018 Breast MRI   Bre[facility] on 09/24/18 Right breast: Signal void inferior right breast, posterior depth from biopsy marker clip. Interval decreased size of the right breast, and now symmetric to the left, compared to prior MRI. Resolution of abnormal nonmass enhancement without evidence of residual enhancing mass. No evidence of axillary or internal mammary lymphadenopathy is seen, markedly improved from prior. There is no abnormal skin, nipple, or pectoralis muscle enhancement.  Left breast: There are no suspicious enhancing masses or areas of non-mass enhancement. No evidence of axillary or internal mammary lymphadenopathy is seen. There is no abnormal skin, nipple, or pectoralis muscle enhancement.   10/09/2018 Imaging   CT chest W Contrast 10/09/18  IMPRESSION: 1. Stable 10 mm right lower lobe pulmonary nodule since 06/05/2018. Nodule is not substantially hypermetabolic on the PET-CT. Continued attention on follow-up recommended. 2. No findings to suggest metastatic disease in the thorax.   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 and surrounding  tissue - Biopsy clips identified - Margin status (see extended anterior margin below)  Invasive carcinoma: Positive anterior-inferior margin  Ductal carcinoma in situ: Positive anterior-inferior margin - Ancillary studies previously reported on [id number](5:00, ribbon clip) Estrogen receptor: Positive (80%) Progesterone receptor: Positive (10%) HER2 IHC: Equivocal (2+) HER2 FISH: Negative HER2/CEP17 ratio: 1.35 Mean HER2 copy number: 1.55 - Ancillary studies previously reported on [id number] (right axillary lymph node) Estrogen receptor: Positive (90%) Progesterone receptor: Positive (20%) HER2 IHC: Positive (3+) - Residual Cancer Burden  Tumor bed size: 41 mm x 20 mm Overall tumor cellularity: 10% Percentage in situ: 60% Number of involved lymph nodes: 7 Diameter of largest metastasis: 6 mm Residual Cancer Burden Score: 3.108 Residual Cancer Burden Class: RCB-II - Other findings: Atypical lobular hyperplasia and columnar cell change with microcalcifications; fibroadenomas with microcalcifications  B: Right axilla, targeted lymph node excision - Six of seven lymph nodes positive for carcinoma (6/7) - Size of largest tumor deposit: 6 mm - Biopsy site identified - Treatment effect: Present - Extracapsular extension: Absent  C: Right axilla, lymph node dissection - One of seven lymph nodes positive for carcinoma, macrometastasis, 6 mm (1/7) - Treatment effect: Absent - Extracapsular extension: Absent  D: Right breast, nipple core biopsy - Negative for carcinoma  E: Breast, right, anterior margin, excision - Microscopic foci of ductal carcinoma in situ - Margin: Negative, <1 mm    11/05/2018 Cancer Staging   Staging form: Breast, AJCC 8th Edition - Pathologic stage from 11/05/2018: No Stage Recommended (ypT1c, pN2a, cM0, G2, ER+, PR+, HER2-) - Signed by [name], MD on 11/23/2018    11/24/2018 - 08/24/2019 Chemotherapy   Due to positive LN on surgical pathology her  maintance Herceptin/Perjeta was changed to [place] for about 14 cycles on 11/24/18. Completed on 08/24/19   12/24/2018 - 02/03/2019 Radiation Therapy   adjuvant radiation at [facility] on 12/24/18 and completed on 02/03/19.    01/23/2019 Imaging   CT Chest wo  IMPRESSION: 1. Stable right lower lobe pulmonary nodule. 2. No signs of new or suspicious finding since study of 10/09/2018, now status post right mastectomy, axillary dissection and breast reconstruction.   02/16/2019 -  Anti-estrogen oral therapy   -Monthly Zoladex injection started 12/19/18-02/22/20. Stopped after BSO surgery in 03/2020 -Letrozole 2.5 mg started 02/16/19    04/20/2019 Survivorship   SCP delivered by Cira Rue, NP    09/22/2019 - 09/2020 Chemotherapy   Nerlynx starting at 4 tablets (160 mg total) by mouth daily for 7 days, THEN 5 tablets (200 mg total) daily for 7 days, THEN 6 tablets (240 mg total) daily for 16 days, starting on 09/22/19. Stop mid June.    11/24/2019 Surgery   left prophylactic mastectomy and bilateral reconstruction at [facility] by Dr Dorann Ou 11/24/19.   03/21/2020 Surgery   XI ROBOTIC ASSISTED TOTAL HYSTERECTOMY WITH BILATERAL SALPINGO OOPHORECTOMY/CYSTOSCOPY, Pelvic Washing by Dr Emilia Beck  A. PERITONEAL, WASHING:    FINAL MICROSCOPIC DIAGNOSIS:  - Reactive mesothelial cells present    FINAL MICROSCOPIC DIAGNOSIS:   A. UTERUS, CERVIX, BILATERAL TUBE AND OVARIES:  - Uterus:       Endometrium: Inactive endometrium. No hyperplasia or malignancy.       Myometrium: Unremarkable. No malignancy.       Serosa: Unremarkable. No malignancy.  - Cervix: Benign squamous and endocervical mucosa. No dysplasia or  malignancy.  -  Bilateral ovaries: Unremarkable. No malignancy.  - Bilateral fallopian tubes: Unremarkable. No malignancy.     05/01/2021 Imaging   EXAM: CT CHEST WITHOUT CONTRAST  IMPRESSION: 1. Stable 9 x 7 mm peripheral right lower lobe pulmonary nodule. This is  unchanged comparing back to PET-CT 06/05/2018 when showed no substantial hypermetabolism. Given stability over greater than 2 years, this is compatible with benign etiology. 2. Stable appearance of hepatic cysts.      INTERVAL HISTORY:  Kim [name] was contacted for a follow up of breast cancer. She was last seen by me on 05/04/21. She reports she is doing well overall, no new concerns.    All other systems were reviewed with the patient and are negative.  MEDICAL HISTORY:  Past Medical History:  Diagnosis Date   Acid reflux    Acute bronchitis    Anemia    iron infusions   Anxiety    Asthma    rare   Cancer ([place])    Depression    Family history of brain cancer    Family history of breast cancer    Family history of prostate cancer    Migraine     SURGICAL HISTORY: Past Surgical History:  Procedure Laterality Date   BREAST BIOPSY Right 05/23/2018   x3, malignant   DILATION AND CURETTAGE OF UTERUS     2006 x1, 2009 x2   LAPAROTOMY  2008   PORTACATH PLACEMENT Left 06/06/2018   Procedure: INSERTION PORT-A-CATH POSSIBLE ULTRASOUND;  Surgeon: [name], MD;  Location: [place];  Service: General;  Laterality: Left;   ROBOTIC ASSISTED TOTAL HYSTERECTOMY WITH BILATERAL SALPINGO OOPHERECTOMY Bilateral 03/21/2020   Procedure: XI ROBOTIC ASSISTED TOTAL HYSTERECTOMY WITH BILATERAL SALPINGO OOPHORECTOMY/CYSTOSCOPY,Pelvic Washing;  Surgeon: [name], MD;  Location: [place];  Service: Gynecology;  Laterality: Bilateral;  [name] to RNFA confirmed on 03/03/20 CS    I have reviewed the social history and family history with the patient and they are unchanged from previous note.  ALLERGIES:  has No Known Allergies.  MEDICATIONS:  Current Outpatient Medications  Medication Sig Dispense Refill   ALPRAZolam (XANAX) 0.5 MG tablet Take 0.25-0.5 mg by mouth at bedtime as needed for anxiety.      CALCIUM-MAGNESIUM-VITAMIN D PO Take 1  tablet by mouth daily.     exemestane (AROMASIN) 25 MG tablet TAKE 1 TABLET (25 MG TOTAL) BY MOUTH DAILY AFTER BREAKFAST. 90 tablet 1   ibuprofen (ADVIL) 800 MG tablet Take 1 tablet (800 mg total) by mouth every 8 (eight) hours as needed. 60 tablet 1   melatonin 5 MG TABS Take 5 mg by mouth at bedtime.      Polyethylene Glycol 3350 (MIRALAX PO) Take 17 g by mouth daily. As needed     senna (SENOKOT) 8.6 MG tablet Take 1 tablet by mouth daily.     sertraline (ZOLOFT) 100 MG tablet Take 1 tablet (100 mg total) by mouth daily. 90 tablet 2   No current facility-administered medications for this visit.    PHYSICAL EXAMINATION: ECOG PERFORMANCE STATUS: 0 - Asymptomatic  There were no vitals filed for this visit. Wt Readings from Last 3 Encounters:  05/04/21 144 lb 7 oz (65.5 kg)  01/16/21 138 lb 8 oz (62.8 kg)  12/12/20 140 lb 12.8 oz (63.9 kg)     No vitals taken today, Exam not performed today  LABORATORY DATA:  I have reviewed the data as listed    Latest Ref Rng &  Units 05/01/2021    9:53 AM 01/16/2021    8:07 AM 09/14/2020   12:38 PM  CBC  WBC 4.0 - 10.5 K/uL 2.7   2.4   3.5    Hemoglobin 12.0 - 15.0 g/dL 12.6   13.0   12.0    Hematocrit 36.0 - 46.0 % 38.3   38.5   36.3    Platelets 150 - 400 K/uL 156   149   182          Latest Ref Rng & Units 05/01/2021    9:53 AM 01/16/2021    8:07 AM 09/14/2020   12:38 PM  CMP  Glucose 70 - 99 mg/dL 73   89   86    BUN 6 - 20 mg/dL '15   13   14    ' Creatinine 0.44 - 1.00 mg/dL 0.76   0.84   0.81    Sodium 135 - 145 mmol/L 141   141   141    Potassium 3.5 - 5.1 mmol/L 4.1   3.9   4.4    Chloride 98 - 111 mmol/L 104   103   104    CO2 22 - 32 mmol/L '31   27   29    ' Calcium 8.9 - 10.3 mg/dL 10.0   9.8   9.7    Total Protein 6.5 - 8.1 g/dL 7.3   7.3   7.2    Total Bilirubin 0.3 - 1.2 mg/dL 0.6   0.4   0.3    Alkaline Phos 38 - 126 U/L 65   67   64    AST 15 - 41 U/L '11   11   12    ' ALT 0 - 44 U/L '18   15   16        ' RADIOGRAPHIC  STUDIES: I have personally reviewed the radiological images as listed and agreed with the findings in the report. No results found.    No orders of the defined types were placed in this encounter.  All questions were answered. The patient knows to call the clinic with any problems, questions or concerns. No barriers to learning was detected. The total time spent in the appointment was 15 minutes.     [name], MD 09/06/2021   I, Wilburn Mylar, am acting as scribe for [name], MD.   I have reviewed the above documentation for accuracy and completeness, and I agree with the above.

## 2021-09-07 ENCOUNTER — Other Ambulatory Visit: Payer: Self-pay

## 2021-09-07 ENCOUNTER — Other Ambulatory Visit: Payer: Self-pay | Admitting: Hematology

## 2021-10-19 ENCOUNTER — Other Ambulatory Visit: Payer: Self-pay | Admitting: Hematology

## 2021-11-29 ENCOUNTER — Other Ambulatory Visit: Payer: Self-pay

## 2021-11-29 DIAGNOSIS — Z170 Estrogen receptor positive status [ER+]: Secondary | ICD-10-CM

## 2021-11-29 DIAGNOSIS — M8580 Other specified disorders of bone density and structure, unspecified site: Secondary | ICD-10-CM

## 2021-11-30 ENCOUNTER — Inpatient Hospital Stay: Payer: BC Managed Care – PPO | Attending: Hematology

## 2021-11-30 ENCOUNTER — Encounter: Payer: Self-pay | Admitting: Hematology

## 2021-11-30 ENCOUNTER — Inpatient Hospital Stay: Payer: BC Managed Care – PPO | Admitting: Hematology

## 2021-11-30 ENCOUNTER — Other Ambulatory Visit: Payer: Self-pay

## 2021-11-30 ENCOUNTER — Inpatient Hospital Stay: Payer: BC Managed Care – PPO

## 2021-11-30 VITALS — BP 103/65 | HR 66 | Temp 97.9°F | Resp 16 | Wt 146.0 lb

## 2021-11-30 VITALS — BP 92/68 | HR 73 | Temp 98.8°F | Resp 15 | Wt 146.4 lb

## 2021-11-30 DIAGNOSIS — C50811 Malignant neoplasm of overlapping sites of right female breast: Secondary | ICD-10-CM | POA: Diagnosis present

## 2021-11-30 DIAGNOSIS — Z79811 Long term (current) use of aromatase inhibitors: Secondary | ICD-10-CM | POA: Diagnosis not present

## 2021-11-30 DIAGNOSIS — M85852 Other specified disorders of bone density and structure, left thigh: Secondary | ICD-10-CM | POA: Insufficient documentation

## 2021-11-30 DIAGNOSIS — Z170 Estrogen receptor positive status [ER+]: Secondary | ICD-10-CM | POA: Diagnosis not present

## 2021-11-30 DIAGNOSIS — M8580 Other specified disorders of bone density and structure, unspecified site: Secondary | ICD-10-CM

## 2021-11-30 LAB — CMP (CANCER CENTER ONLY)
ALT: 15 U/L (ref 0–44)
AST: 12 U/L — ABNORMAL LOW (ref 15–41)
Albumin: 4.5 g/dL (ref 3.5–5.0)
Alkaline Phosphatase: 58 U/L (ref 38–126)
Anion gap: 5 (ref 5–15)
BUN: 16 mg/dL (ref 6–20)
CO2: 30 mmol/L (ref 22–32)
Calcium: 10.3 mg/dL (ref 8.9–10.3)
Chloride: 104 mmol/L (ref 98–111)
Creatinine: 0.95 mg/dL (ref 0.44–1.00)
GFR, Estimated: >60 mL/min (ref >60)
Glucose, Bld: 99 mg/dL (ref 70–99)
Potassium: 4.3 mmol/L (ref 3.5–5.1)
Sodium: 139 mmol/L (ref 135–145)
Total Bilirubin: 0.5 mg/dL (ref 0.3–1.2)
Total Protein: 7.2 g/dL (ref 6.5–8.1)

## 2021-11-30 LAB — CBC WITH DIFFERENTIAL (CANCER CENTER ONLY)
Abs Immature Granulocytes: 0 10*3/uL (ref 0.00–0.07)
Basophils Absolute: 0 10*3/uL (ref 0.0–0.1)
Basophils Relative: 1 %
Eosinophils Absolute: 0.1 10*3/uL (ref 0.0–0.5)
Eosinophils Relative: 5 %
HCT: 36.7 % (ref 36.0–46.0)
Hemoglobin: 12.7 g/dL (ref 12.0–15.0)
Immature Granulocytes: 0 %
Lymphocytes Relative: 32 %
Lymphs Abs: 0.8 10*3/uL (ref 0.7–4.0)
MCH: 31.8 pg (ref 26.0–34.0)
MCHC: 34.6 g/dL (ref 30.0–36.0)
MCV: 91.8 fL (ref 80.0–100.0)
Monocytes Absolute: 0.2 10*3/uL (ref 0.1–1.0)
Monocytes Relative: 8 %
Neutro Abs: 1.4 10*3/uL — ABNORMAL LOW (ref 1.7–7.7)
Neutrophils Relative %: 54 %
Platelet Count: 158 10*3/uL (ref 150–400)
RBC: 4 MIL/uL (ref 3.87–5.11)
RDW: 12.7 % (ref 11.5–15.5)
WBC Count: 2.5 10*3/uL — ABNORMAL LOW (ref 4.0–10.5)
nRBC: 0 % (ref 0.0–0.2)

## 2021-11-30 MED ORDER — SODIUM CHLORIDE 0.9 % IV SOLN: Freq: Once | INTRAVENOUS | Status: AC

## 2021-11-30 MED ORDER — ZOLEDRONIC ACID 4 MG/100ML IV SOLN
4.0000 mg | Freq: Once | INTRAVENOUS | Status: AC
  Administered 2021-11-30: 4 mg via INTRAVENOUS
  Filled 2021-11-30: qty 100

## 2021-11-30 NOTE — Progress Notes (Signed)
[place]   [phone] Fax:([phone]   Clinic Follow up Note   Patient Care Team: [name], MD as PCP - General (Family Medicine) [name], MD as Consulting Physician (General Surgery) [name], MD as Consulting Physician (Hematology) [name], MD as Consulting Physician (Radiation Oncology) [name], MD as Consulting Physician (Obstetrics and Gynecology) [name], RN as Oncology Nurse Navigator [name], RN as Oncology Nurse Navigator [name], NP as Nurse Practitioner (Nurse Practitioner)  Date of Service:  11/30/2021  CHIEF COMPLAINT: f/u of multifocal right breast cancer  CURRENT THERAPY:  Antiestrogen therapy started 02/16/19 with Letrozole 2.5 mg, switched to Exemestane 01/2021  ASSESSMENT & PLAN:  Kim [name] is a 46 y.o. post-hysterectomy female with   1. 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   -s/p prophylactic left mastectomy and b/l reconstruction 11/24/19 by Dr. Tera Mater [facility]). -she completed 1 year Nerlynx in 09/2020 -she started Letrozole in 02/2019 (monthly Zoladex 12/2018-02/2020 until BSO 03/21/20). She developed worsening joint stiffness and switched to exemestane 01/16/21. Tolerating well overall with improved joint pain. -she is clinically doing very well. Labs reviewed, she has overall stable leukopenia, ANC also stable at 1.4. Physical exam was unremarkable. There is no clinical concern for recurrence. -continue exemestane and surveillance; she is s/p b/l mastectomies. -f/u in 6 months with next Zometa  2. Osteopenia  -I reviewed AI such as letrozole can weaken her bone  -baseline DEXA from 07/21/19  shows osteopenia with T-score -1.3 at left femur neck. -repeat DEXA 08/31/21 showed some worsening with T-score of -1.9, which is getting worse. She endorses taking OTC calcium and vitamin D, and does weightbearing exercise regularly.  -she is scheduled to begin Zometa today. Plan for every 6 months for 2 years. We reviewed that she should not proceed with any intensive dental procedures for one month after infusion.   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 She has discovered a stronger maternal family history of prostate and breast cancer in her 89st, 2nd and 3rd degree relatives, Including her mother who was recently diagnosed.  -We discussed the risk of future breast cancer from [place] mutation. Given her family history, she is s/p b/l mastectomy and reconstruction (10/21/18 and 11/24/19) and s/p BSO with hysterectomy (03/21/20).  -Her baseline 06/2020 colonoscopy/endoscopy was benign per patient.    4. Insomnia, Stress/Anxiety -Worsened with breast cancer diagnosis.  -Currently on Zoloft and uses Xanax only as needed. She has not needed Ambien.   5. COVID-19 positive infection in 11/11/19 (mildly symptomatic) - She has recovered well    6. Left Lower back pain, Arthritis  -lumbar spine MRI from 07/22/20 showed arthritis.  -Has improved with PT and yoga     PLAN: -proceed with Zometa today -continue exemestane -lab, f/u, and Zometa in 6 months   No problem-specific Assessment & Plan notes found for this encounter.   SUMMARY OF ONCOLOGIC HISTORY: Oncology History Overview Note  Cancer Staging Cancer of overlapping sites of right female breast [facility]) Staging form: Breast, AJCC 8th Edition - Clinical stage from 05/28/2018: Stage IB (cT2, cN1, cM0, G3, ER+, PR+, HER2+) - Signed by [name], MD on 05/28/2018  Malignant neoplasm of lower-outer  quadrant of right breast of female, estrogen receptor positive ([place]) Staging form: Breast,  AJCC 8th Edition - Clinical stage from 05/28/2018: Stage IIB (cT2, cN1, cM0, G3, ER+, PR+, HER2-) - Unsigned     Cancer of overlapping sites of right female breast ([place])  05/14/2018 Mammogram   Right breast mammogram 05/14/18  IMPRESSION  There is a 1.0 x 0.5 x 1.2 cm lobular hypoechoic mass right breast 7 o'clock position 3 cm from nipple, a 0.8 x 0.6 x 1.2 cm lobular hypoechoic mass right breast 7 o'clock position 2 cm from the nipple, and a 0.9 x 0.5 x 0.8 cm lobular hypoechoic mass right breast 5 o'clock position 3 cm from nipple.   There is a 3.8 x 1.0 x 1.3 cm lobular hypoechoic mass right breast 5 o'clock position 2 cm from nipple.   Multiple thickened right axillary lymph nodes (approximately 15). Measuring up to 1.0 cm in thickness. Adenopathy corresponds with axillary palpable abnormality.  Indeterminate round and punctate calcifications upper-outer quadrant right breast.Indeterminate round and punctate calcifications upper-outer quadrant right breast.   05/21/2018 Initial Biopsy   Diagnosis 05/21/18  1. Breast, right, needle core biopsy, axilla, 5 o'clock, ribbon clip - INVASIVE MAMMARY CARCINOMA. - MAMMARY CARCINOMA IN SITU. 2. Breast, right, needle core biopsy, 7 o'clock, coil clip - INVASIVE MAMMARY CARCINOMA. - MAMMARY CARCINOMA IN SITU. 3. Lymph node, needle/core biopsy, right axillary LN, hydromark - METASTATIC MAMMARY CARCINOMA.    05/21/2018 Receptors her2   1. PROGNOSTIC INDICATORS Results: The tumor cells are EQUIVOCAL for Her2 (2+);  HER2 **NEGATIVE** by FISH   Estrogen Receptor: 80%, POSITIVE, MODERATE STAINING INTENSITY Progesterone Receptor: 5%, POSITIVE, MODERATE-WEAK STAINING INTENSITY Proliferation Marker Ki67: 10%  3. PROGNOSTIC INDICATORS Results: The tumor cells are POSITIVE for Her2 (3+). Estrogen Receptor: 90%, POSITIVE, STRONG STAINING INTENSITY Progesterone Receptor: 50%, POSITIVE, MODERATE STAINING INTENSITY Proliferation Marker Ki67:  5%   05/26/2018 Initial Diagnosis   Malignant neoplasm of lower-inner quadrant of right breast of female, estrogen receptor positive ([place])   05/27/2018 Mammogram   Left breast mammogram 05/27/18  IMPRESSION: No mammographic evidence of LEFT breast malignancy.   05/28/2018 Cancer Staging   Staging form: Breast, AJCC 8th Edition - Clinical stage from 05/28/2018: Stage IB (cT2, cN1, cM0, G3, ER+, PR+, HER2+) - Signed by [name], MD on 05/28/2018   06/03/2018 Imaging   MR BREAST BILATERAL W WO CONTRAST INC CAD  IMPRESSION: 1. Large enhancing masses and non mass enhancement identified throughout the majority of the right breast. Additionally, there are smaller masses identified within the upper inner and lower inner right breast. Overall findings are concerning for extensive malignancy throughout all 4 quadrants of the right breast. 2. Extensive bulky lymphadenopathy identified within the right axilla. The matted appearance makes counting the number of abnormal lymph nodes difficult as they are immediately approximated to each other. 3. At least one abnormal appearing right internal mammary lymph node. 4. Nonenhancing lesion within the sternum with associated sternal marrow heterogeneity, raising the possibility of osseous metastatic disease within the sternum. 5. Two enhancing masses within the left breast as described above.     06/05/2018 PET scan   NM PET Image Initial (PI) Skull Base To Thigh  IMPRESSION: 1. Multiple borderline enlarged right axillary lymph nodes identified exhibiting mild to moderate increased uptake. Can not rule out nodal metastasis. Correlation with biopsy recommended. 2. Asymmetric increased uptake within the right breast corresponding to MRI abnormality. 3. Subcentimeter right retropectoral and right internal mammary lymph nodes are again  identified and exhibit nonspecific, low level FDG uptake. 4. Noncalcified solid nodule within the lateral right lower  lobe measures 9 mm without significant radiotracer uptake. 5. No hypermetabolic osseous lesions identified.     06/09/2018 - 11/03/2018 Chemotherapy   Neoadjuvant chemotherapy TCHP, 06/09/2018-09/22/18. Followed by Maintenance Herceptin/Perjeta q3weeks starting 10/14/18 to continue 1 year treatment. Stopped Herceptin/Perjeta on 11/03/18 due to multiple Positive LN of pathology.    06/10/2018 Pathology Results   Surgical pathology  Diagnosis 1. Breast, left, needle core biopsy, LOQ, barbell - MILD FIBROCYSTIC CHANGES. 2. Breast, left, needle core biopsy, UIQ cylinder - MILD FIBROCYSTIC CHANGES.    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 There were no deleterious mutations in AIP, ALK, APC, ATM, AXIN2,BAP1,  BARD1, BLM, BMPR1A, BRCA1, BRCA2, BRIP1, CASR, CDC73, CDH1, CDK4, CDKN1B, CDKN1C, CDKN2A (p14ARF), CDKN2A (p16INK4a), CEBPA, CHEK2, CTNNA1, DICER1, DIS3L2, EGFR (c.2369C>T, p.Thr790Met variant only), EPCAM (Deletion/duplication testing only), FH, FLCN, GATA2, GPC3, GREM1 (Promoter region deletion/duplication testing only), HOXB13 (c.251G>A, p.Gly84Glu), HRAS, KIT, MAX, MEN1, MET, MITF (c.952G>A, p.Glu318Lys variant only), MLH1, MSH2, MSH3, MSH6, MUTYH, NBN, NF1, NF2, NTHL1, PDGFRA, PHOX2B, PMS2, POLD1, POLE, POT1, PRKAR1A, PTCH1, PTEN, RAD50, RAD51C, RAD51D, RB1, RECQL4, RET, RUNX1, SDHAF2, SDHA (sequence changes only), SDHB, SDHC, SDHD, SMAD4, SMARCA4, SMARCB1, SMARCE1, STK11, SUFU, TERC, TERT, TMEM127, TP53, TSC1, TSC2, VHL, WRN and WT1.  .   09/24/2018 Breast MRI   Bre[facility] on 09/24/18 Right breast: Signal void inferior right breast, posterior depth from biopsy marker clip. Interval decreased size of the right breast, and now symmetric to the left, compared to prior MRI. Resolution of abnormal nonmass enhancement  without evidence of residual enhancing mass. No evidence of axillary or internal mammary lymphadenopathy is seen, markedly improved from prior. There is no abnormal skin, nipple, or pectoralis muscle enhancement.  Left breast: There are no suspicious enhancing masses or areas of non-mass enhancement. No evidence of axillary or internal mammary lymphadenopathy is seen. There is no abnormal skin, nipple, or pectoralis muscle enhancement.   10/09/2018 Imaging   CT chest W Contrast 10/09/18  IMPRESSION: 1. Stable 10 mm right lower lobe pulmonary nodule since 06/05/2018. Nodule is not substantially hypermetabolic on the PET-CT. Continued attention on follow-up recommended. 2. No findings to suggest metastatic disease in the thorax.   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 and surrounding tissue - Biopsy clips identified - Margin status (see extended anterior margin below)  Invasive carcinoma: Positive anterior-inferior margin  Ductal carcinoma in situ: Positive anterior-inferior margin - Ancillary studies previously reported on [id number](5:00, ribbon clip) Estrogen receptor: Positive (80%) Progesterone receptor: Positive (10%) HER2 IHC: Equivocal (2+) HER2 FISH: Negative HER2/CEP17 ratio: 1.35 Mean HER2 copy number: 1.55 - Ancillary studies previously reported on [id number] (right axillary lymph node) Estrogen receptor: Positive (90%) Progesterone receptor: Positive (20%) HER2 IHC: Positive (3+) - Residual Cancer Burden  Tumor bed size: 41 mm x 20 mm Overall tumor cellularity: 10% Percentage in situ: 60% Number of involved lymph nodes: 7 Diameter of largest  metastasis: 6 mm Residual Cancer  Burden Score: 3.108 Residual Cancer Burden Class: RCB-II - Other findings: Atypical lobular hyperplasia and columnar cell change with microcalcifications; fibroadenomas with microcalcifications  B: Right axilla, targeted lymph node excision - Six of seven lymph nodes positive for carcinoma (6/7) - Size of largest tumor deposit: 6 mm - Biopsy site identified - Treatment effect: Present - Extracapsular extension: Absent  C: Right axilla, lymph node dissection - One of seven lymph nodes positive for carcinoma, macrometastasis, 6 mm (1/7) - Treatment effect: Absent - Extracapsular extension: Absent  D: Right breast, nipple core biopsy - Negative for carcinoma  E: Breast, right, anterior margin, excision - Microscopic foci of ductal carcinoma in situ - Margin: Negative, <1 mm    11/05/2018 Cancer Staging   Staging form: Breast, AJCC 8th Edition - Pathologic stage from 11/05/2018: No Stage Recommended (ypT1c, pN2a, cM0, G2, ER+, PR+, HER2-) - Signed by [name], MD on 11/23/2018   11/24/2018 - 08/24/2019 Chemotherapy   Due to positive LN on surgical pathology her maintance Herceptin/Perjeta was changed to [place] for about 14 cycles on 11/24/18. Completed on 08/24/19   12/24/2018 - 02/03/2019 Radiation Therapy   adjuvant radiation at [facility] through [facility] on 12/24/18 and completed on 02/03/19.    01/23/2019 Imaging   CT Chest wo  IMPRESSION: 1. Stable right lower lobe pulmonary nodule. 2. No signs of new or suspicious finding since study of 10/09/2018, now status post right mastectomy, axillary dissection and breast reconstruction.   02/16/2019 -  Anti-estrogen oral therapy   -Monthly Zoladex injection started 12/19/18-02/22/20. Stopped after BSO surgery in 03/2020 -Letrozole 2.5 mg started 02/16/19    04/20/2019 Survivorship   SCP delivered by Cira Rue, NP    09/22/2019 - 09/2020 Chemotherapy   Nerlynx starting at 4 tablets (160 mg total) by mouth  daily for 7 days, THEN 5 tablets (200 mg total) daily for 7 days, THEN 6 tablets (240 mg total) daily for 16 days, starting on 09/22/19. Stop mid June.    11/24/2019 Surgery   left prophylactic mastectomy and bilateral reconstruction at [facility] by Dr Dorann Ou 11/24/19.   03/21/2020 Surgery   XI ROBOTIC ASSISTED TOTAL HYSTERECTOMY WITH BILATERAL SALPINGO OOPHORECTOMY/CYSTOSCOPY, Pelvic Washing by Dr Emilia Beck  A. PERITONEAL, WASHING:    FINAL MICROSCOPIC DIAGNOSIS:  - Reactive mesothelial cells present    FINAL MICROSCOPIC DIAGNOSIS:   A. UTERUS, CERVIX, BILATERAL TUBE AND OVARIES:  - Uterus:       Endometrium: Inactive endometrium. No hyperplasia or malignancy.       Myometrium: Unremarkable. No malignancy.       Serosa: Unremarkable. No malignancy.  - Cervix: Benign squamous and endocervical mucosa. No dysplasia or  malignancy.  - Bilateral ovaries: Unremarkable. No malignancy.  - Bilateral fallopian tubes: Unremarkable. No malignancy.     05/01/2021 Imaging   EXAM: CT CHEST WITHOUT CONTRAST  IMPRESSION: 1. Stable 9 x 7 mm peripheral right lower lobe pulmonary nodule. This is unchanged comparing back to PET-CT 06/05/2018 when showed no substantial hypermetabolism. Given stability over greater than 2 years, this is compatible with benign etiology. 2. Stable appearance of hepatic cysts.      INTERVAL HISTORY:  LOUANNA VANLIEW is here for a follow up of breast cancer. She was last seen by me on 09/06/21. She presents to the clinic alone. She tells me school has started back for her. She notes she spent some time in [place] during her summer vacation. She reports the joint pain in her hands has improved on  exemestane. She notes occasional hot flashes   All other systems were reviewed with the patient and are negative.  MEDICAL HISTORY:  Past Medical History:  Diagnosis Date   Acid reflux    Acute bronchitis    Anemia    iron infusions   Anxiety    Asthma    rare    Cancer ([place])    Depression    Family history of brain cancer    Family history of breast cancer    Family history of prostate cancer    Migraine     SURGICAL HISTORY: Past Surgical History:  Procedure Laterality Date   BREAST BIOPSY Right 05/23/2018   x3, malignant   DILATION AND CURETTAGE OF UTERUS     2006 x1, 2009 x2   LAPAROTOMY  2008   PORTACATH PLACEMENT Left 06/06/2018   Procedure: INSERTION PORT-A-CATH POSSIBLE ULTRASOUND;  Surgeon: [name], MD;  Location: [place];  Service: General;  Laterality: Left;   ROBOTIC ASSISTED TOTAL HYSTERECTOMY WITH BILATERAL SALPINGO OOPHERECTOMY Bilateral 03/21/2020   Procedure: XI ROBOTIC ASSISTED TOTAL HYSTERECTOMY WITH BILATERAL SALPINGO OOPHORECTOMY/CYSTOSCOPY,Pelvic Washing;  Surgeon: [name], MD;  Location: [place];  Service: Gynecology;  Laterality: Bilateral;  [name] to RNFA confirmed on 03/03/20 CS    I have reviewed the social history and family history with the patient and they are unchanged from previous note.  ALLERGIES:  has No Known Allergies.  MEDICATIONS:  Current Outpatient Medications  Medication Sig Dispense Refill   ALPRAZolam (XANAX) 0.5 MG tablet Take 0.25-0.5 mg by mouth at bedtime as needed for anxiety.      CALCIUM-MAGNESIUM-VITAMIN D PO Take 1 tablet by mouth daily.     exemestane (AROMASIN) 25 MG tablet TAKE 1 TABLET (25 MG TOTAL) BY MOUTH DAILY AFTER BREAKFAST. 90 tablet 1   ibuprofen (ADVIL) 800 MG tablet Take 1 tablet (800 mg total) by mouth every 8 (eight) hours as needed. 60 tablet 1   melatonin 5 MG TABS Take 5 mg by mouth at bedtime.      Polyethylene Glycol 3350 (MIRALAX PO) Take 17 g by mouth daily. As needed     senna (SENOKOT) 8.6 MG tablet Take 1 tablet by mouth daily.     sertraline (ZOLOFT) 100 MG tablet Take 1 tablet (100 mg total) by mouth daily. 90 tablet 2   No current facility-administered medications for this visit.    PHYSICAL  EXAMINATION: ECOG PERFORMANCE STATUS: 0 - Asymptomatic  Vitals:   11/30/21 0835  BP: 92/68  Pulse: 73  Resp: 15  Temp: 98.8 F (37.1 C)  SpO2: 100%   Wt Readings from Last 3 Encounters:  11/30/21 146 lb (66.2 kg)  11/30/21 146 lb 6.4 oz (66.4 kg)  05/04/21 144 lb 7 oz (65.5 kg)     GENERAL:alert, no distress and comfortable SKIN: skin color, texture, turgor are normal, no rashes or significant lesions EYES: normal, Conjunctiva are pink and non-injected, sclera clear  NECK: supple, thyroid normal size, non-tender, without nodularity LYMPH:  no palpable lymphadenopathy in the cervical, axillary LUNGS: clear to auscultation and percussion with normal breathing effort HEART: regular rate & rhythm and no murmurs and no lower extremity edema ABDOMEN:abdomen soft, non-tender and normal bowel sounds Musculoskeletal:no cyanosis of digits and no clubbing  NEURO: alert & oriented x 3 with fluent speech, no focal motor/sensory deficits BREAST: No palpable mass, nodules or adenopathy bilaterally. Breast exam benign.   LABORATORY DATA:  I have reviewed the  data as listed    Latest Ref Rng & Units 11/30/2021    8:21 AM 05/01/2021    9:53 AM 01/16/2021    8:07 AM  CBC  WBC 4.0 - 10.5 K/uL 2.5  2.7  2.4   Hemoglobin 12.0 - 15.0 g/dL 12.7  12.6  13.0   Hematocrit 36.0 - 46.0 % 36.7  38.3  38.5   Platelets 150 - 400 K/uL 158  156  149         Latest Ref Rng & Units 11/30/2021    8:21 AM 05/01/2021    9:53 AM 01/16/2021    8:07 AM  CMP  Glucose 70 - 99 mg/dL 99  73  89   BUN 6 - 20 mg/dL '16  15  13   ' Creatinine 0.44 - 1.00 mg/dL 0.95  0.76  0.84   Sodium 135 - 145 mmol/L 139  141  141   Potassium 3.5 - 5.1 mmol/L 4.3  4.1  3.9   Chloride 98 - 111 mmol/L 104  104  103   CO2 22 - 32 mmol/L '30  31  27   ' Calcium 8.9 - 10.3 mg/dL 10.3  10.0  9.8   Total Protein 6.5 - 8.1 g/dL 7.2  7.3  7.3   Total Bilirubin 0.3 - 1.2 mg/dL 0.5  0.6  0.4   Alkaline Phos 38 - 126 U/L 58  65  67   AST 15  - 41 U/L '12  11  11   ' ALT 0 - 44 U/L '15  18  15       ' RADIOGRAPHIC STUDIES: I have personally reviewed the radiological images as listed and agreed with the findings in the report. No results found.    No orders of the defined types were placed in this encounter.  All questions were answered. The patient knows to call the clinic with any problems, questions or concerns. No barriers to learning was detected. The total time spent in the appointment was 30 minutes.     [name], MD 11/30/2021   I, Wilburn Mylar, am acting as scribe for [name], MD.   I have reviewed the above documentation for accuracy and completeness, and I agree with the above.

## 2021-11-30 NOTE — Progress Notes (Signed)
Per patient she has dental clearance. Corrected calcium is 9.9 and Scr 0.95.

## 2021-11-30 NOTE — Patient Instructions (Signed)

## 2021-12-05 ENCOUNTER — Telehealth: Payer: Self-pay | Admitting: Hematology

## 2021-12-05 NOTE — Telephone Encounter (Signed)
Left message with follow-up appointment per 8/17 los. 

## 2021-12-06 IMAGING — MR MR LUMBAR SPINE WO/W CM
4 of 7 series · 21 of 48 positions shown · IV contrast (multihance)
Comparison: None.

CLINICAL DATA: Breast cancer.  Low back pain.

EXAM:
MRI LUMBAR SPINE WITHOUT AND WITH CONTRAST
TECHNIQUE: Multiplanar and multiecho pulse sequences of the lumbar spine were
obtained without and with intravenous contrast.
CONTRAST:  13mL MULTIHANCE GADOBENATE DIMEGLUMINE 529 MG/ML IV SOLN

[Series 6: T1 · sagittal · 4.0mm · 0.73mm/px · 3 of 15 slices shown]
[im 1/15]
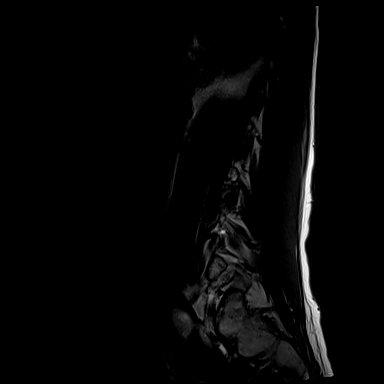
[im 10/15]
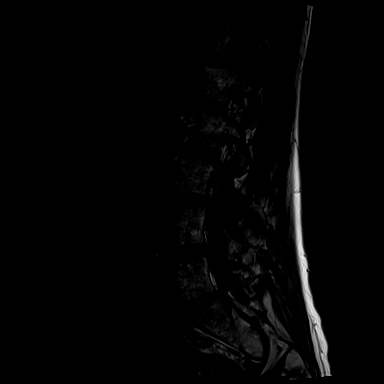
[im 15/15]
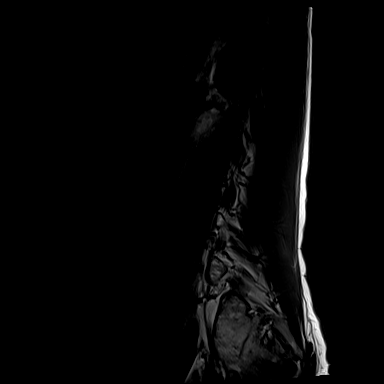

[Series 11: T2 · axial · 4.0mm · 0.28mm/px · z∈[-66,+161]mm · 11 of 46 slices shown (1 of 2)]
[im 1/46]
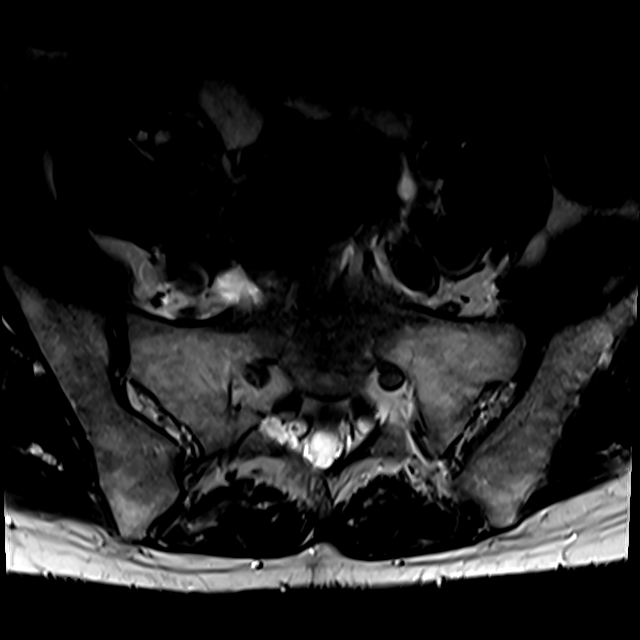
[im 5/46]
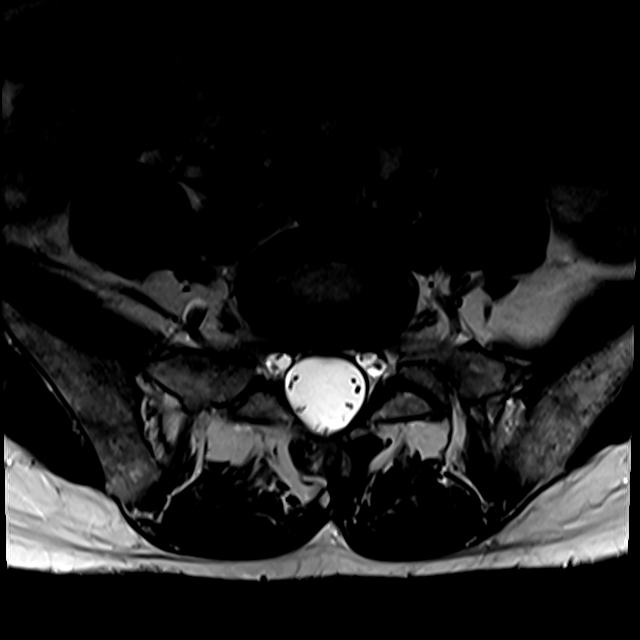
[im 10/46]
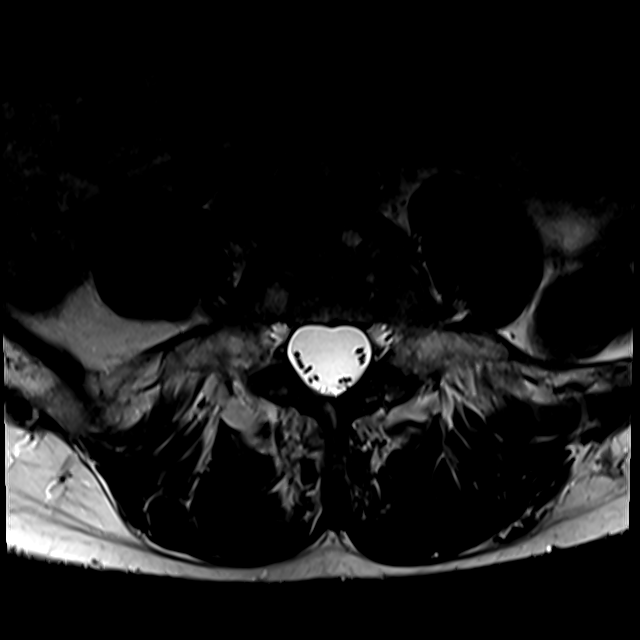
[im 14/46]
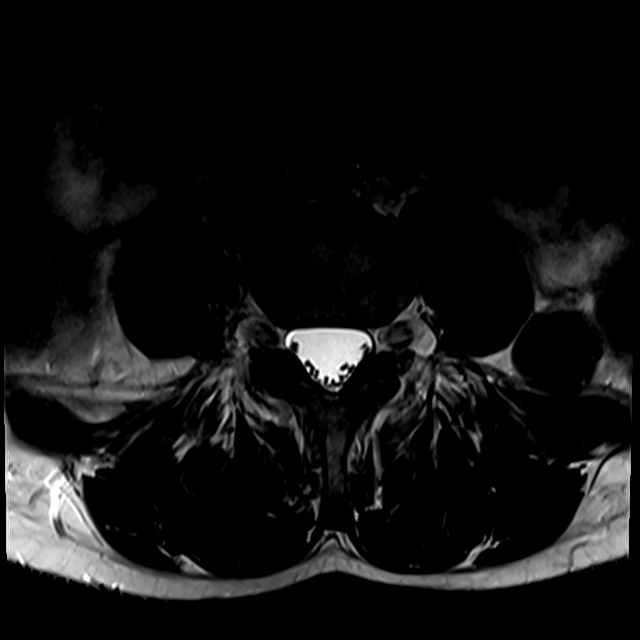
[im 19/46]
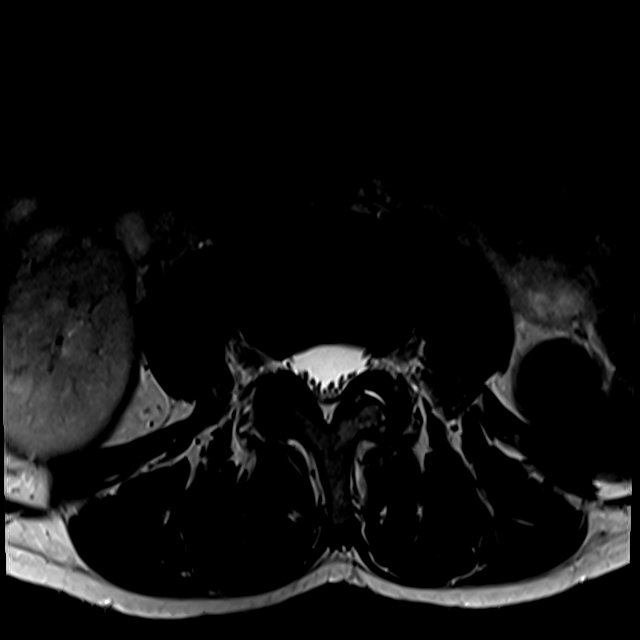
[im 23/46]
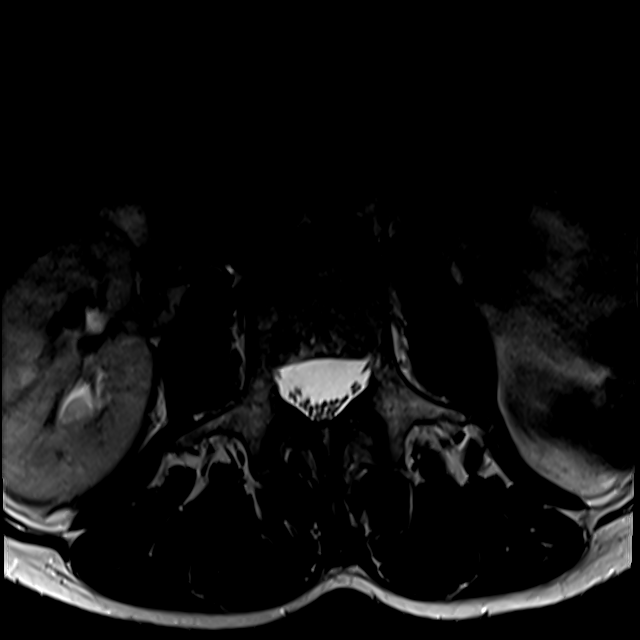
[im 28/46]
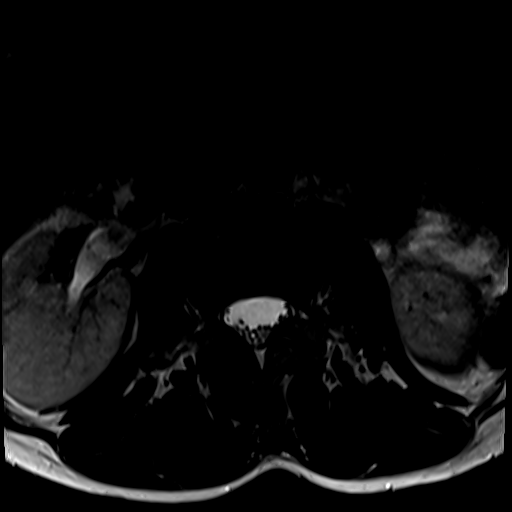
[im 32/46]
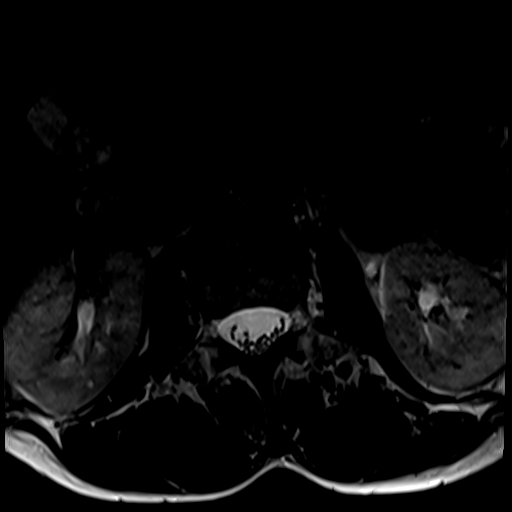
[im 37/46]
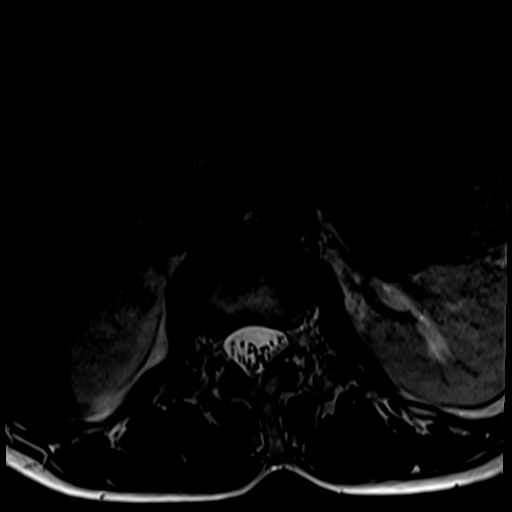
[im 41/46]
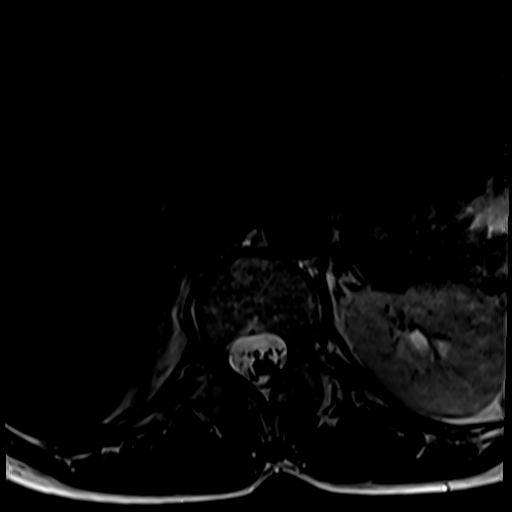
[im 46/46]
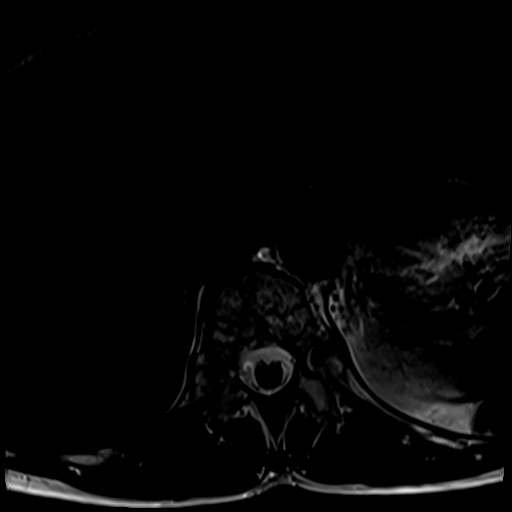

[Series 12: T2 · sagittal · 4.0mm · 0.73mm/px · 4 of 15 slices shown (2 of 2)]
[im 1/15]
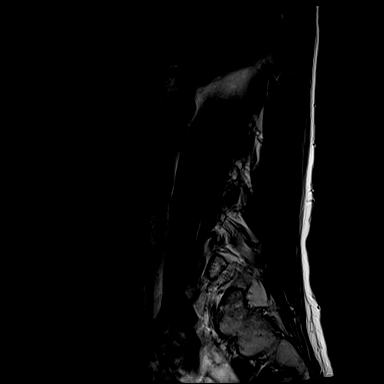
[im 5/15]
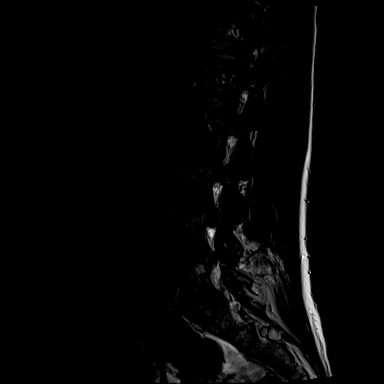
[im 10/15]
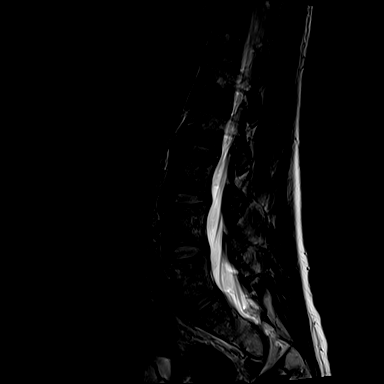
[im 15/15]
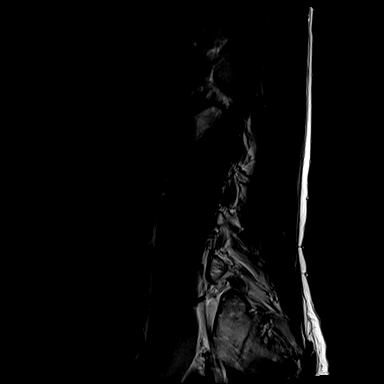

[Series 13: T1 fat-sat post-contrast · sagittal · 4.0mm · 0.73mm/px · 3 of 15 slices shown]
[im 1/15]
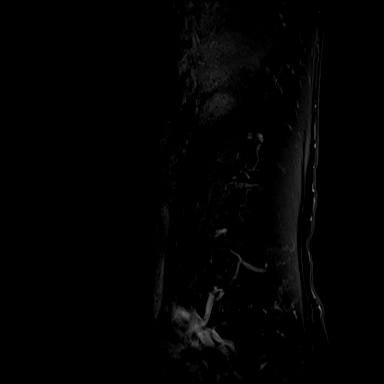
[im 10/15]
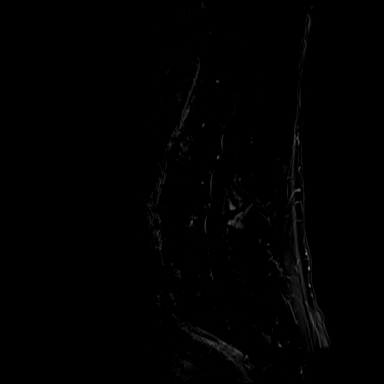
[im 15/15]
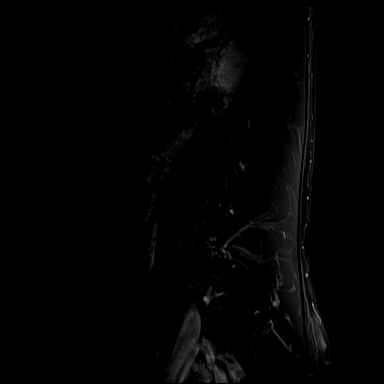

[21 of 48 positions shown; findings below may reference images not displayed]

FINDINGS: Segmentation:  Standard.

Alignment:  Physiologic.

Vertebrae: No fracture, evidence of discitis, or bone lesion. Marrow
edema of the articular processes and edema and contrast enhancement
of periarticular soft tissues at the L3-4 left facet joint.

Conus medullaris and cauda equina: Conus extends to the L1-2 level.
Conus and cauda equina appear normal.

Paraspinal and other soft tissues: Negative.

Disc levels:

No significant disc herniation, spinal canal or neural foraminal
stenosis at any level.
IMPRESSION: 1. Marrow edema of the articular processes and periarticular soft
tissues at the L3-4 left facet joint. This is likely due to
degenerative facet disease.
2. No evidence of metastatic disease to the lumbar spine.
3. No high-grade spinal canal or neural foraminal stenosis at any
level.

## 2022-01-25 ENCOUNTER — Other Ambulatory Visit: Payer: Self-pay | Admitting: Hematology

## 2022-02-15 ENCOUNTER — Other Ambulatory Visit: Payer: Self-pay | Admitting: Hematology

## 2022-02-15 DIAGNOSIS — Z170 Estrogen receptor positive status [ER+]: Secondary | ICD-10-CM

## 2022-04-18 NOTE — Progress Notes (Unsigned)
[facility] Kim [name] Kim [name] Phone: [phone] Subjective:   Kim [name], am serving as a scribe for Dr. Hulan [name].  I'm seeing this patient by the request  of:  Kim Small, MD  CC: Right elbow pain  [id number]  Kim [name] is a 47 y.o. female coming in with complaint of R elbow pain. 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 Had 14 lymphnodes removed within R axilla. Pain over lateral epicondyle. Notes decrease in strength. Painful to pronate and supinate and grab a jug of milk. Not painful when playing.   Has been seeing Kim [name] in the past month. Pain is improving somewhat but does come back within in a few days of his work. Has been doing exercises he prescribed for her at home.       Past Medical History:  Diagnosis Date   Acid reflux    Acute bronchitis    Anemia    iron infusions   Anxiety    Asthma    rare   Cancer ([place])    Depression    Family history of brain cancer    Family history of breast cancer    Family history of prostate cancer    Migraine    Past Surgical History:  Procedure Laterality Date   BREAST BIOPSY Right 05/23/2018   x3, malignant   DILATION AND CURETTAGE OF UTERUS     2006 x1, 2009 x2   LAPAROTOMY  2008   PORTACATH PLACEMENT Left 06/06/2018   Procedure: INSERTION PORT-A-CATH POSSIBLE ULTRASOUND;  Surgeon: Kim Kussmaul, MD;  Location: [facility];  Service: General;  Laterality: Left;   ROBOTIC ASSISTED TOTAL HYSTERECTOMY WITH BILATERAL SALPINGO OOPHERECTOMY Bilateral 03/21/2020   Procedure: XI ROBOTIC ASSISTED TOTAL HYSTERECTOMY WITH BILATERAL SALPINGO OOPHORECTOMY/CYSTOSCOPY,Pelvic Washing;  Surgeon: Kim Gell, MD;  Location: [facility];  Service: Gynecology;  Laterality: Bilateral;  Kim [name]. to RNFA confirmed on 03/03/20 CS   Social History   Socioeconomic History   Marital status: Married    Spouse name: Not  on file   Number of children: 3   Years of education: Not on file   Highest education level: Not on file  Occupational History   Not on file  Tobacco Use   Smoking status: Never   Smokeless tobacco: Never  Vaping Use   Vaping Use: Never used  Substance and Sexual Activity   Alcohol use: Not Currently    Comment: 3-4 drinks a week, wine and beer    Drug use: [name]   Sexual activity: Not Currently    Birth control/protection: I.U.D.  Other Topics Concern   Not on file  Social History Narrative   Not on file   Social Determinants of Health   Financial Resource Strain: Not on file  Food Insecurity: Not on file  Transportation Needs: Not on file  Physical Activity: Not on file  Stress: Not on file  Social Connections: Not on file   [name] Known Allergies Family History  Problem Relation Age of Onset   Breast cancer Mother    Cancer Mother    Hyperlipidemia Father    Cancer Sister 26       Astrocytoma and Meningioma   Cancer Maternal Aunt        pitutory cancer   Breast cancer Maternal Great-grandmother 18       MGFs mother   Prostate cancer Maternal Grandfather 72   Prostate cancer  Maternal Uncle 59   Skin cancer Maternal Grandmother    Cancer - Other Other        pituitary, MGMs sister, died in her 48s   Breast cancer Other        PGFs sister         Current Outpatient Medications (Other):    ALPRAZolam (XANAX) 0.5 MG tablet, Take 0.25-0.5 mg by mouth at bedtime as needed for anxiety.    CALCIUM-MAGNESIUM-VITAMIN D PO, Take 1 tablet by mouth daily.   exemestane (AROMASIN) 25 MG tablet, TAKE 1 TABLET BY MOUTH EVERY DAY AFTER BREAKFAST   gabapentin (NEURONTIN) 100 MG capsule, Take 2 capsules (200 mg total) by mouth at bedtime.   melatonin 5 MG TABS, Take 5 mg by mouth at bedtime.    senna (SENOKOT) 8.6 MG tablet, Take 1 tablet by mouth daily.   sertraline (ZOLOFT) 100 MG tablet, TAKE 1 TABLET BY MOUTH EVERY DAY   Reviewed prior external information including  notes and imaging from  primary care provider As well as notes that were available from care everywhere and other healthcare systems.  Reviewed imaging including patient'[name] bone density in 2023 showing the patient is osteopenic, past medical history significant for breast cancer.  Past medical history, social, surgical and family history all reviewed in electronic medical record.  [name] pertanent information unless stated regarding to the chief complaint.   Review of Systems:  [name] headache, visual changes, nausea, vomiting, diarrhea, constipation, dizziness, abdominal pain, skin rash, fevers, chills, night sweats, weight loss, swollen lymph nodes, body aches, joint swelling, chest pain, shortness of breath, mood changes. POSITIVE muscle aches  Objective  Blood pressure 100/72, pulse (!) 56, height '5\' 9"'$  (1.753 m), weight 149 lb (67.6 kg), SpO2 98 %, unknown if currently breastfeeding.   General: [name] apparent distress alert and oriented x3 mood and affect normal, dressed appropriately.  HEENT: Pupils equal, extraocular movements intact  Respiratory: Patient'[name] speak in full sentences and does not appear short of breath  Cardiovascular: [name] lower extremity edema, non tender, [name] erythema  Right elbow exam shows mild ttp in the lateral epicondylar region  Patient does have a negative Tinel'[name] at the ulnar area.  Negative Tinel'[name] at the wrist as well.  Positive thoracic outlet as well as some mild positive with tightness of the scalenes on the right side.  Negative Spurling'[name].  Limited muscular skeletal ultrasound was performed and interpreted by Kim [name], M  Limited ultrasound shows a regular diameter of the median nerve and the ulnar nerve.  Very mild hypoechoic changes noted at the insertion of the common extensor tendon at the lateral epicondylar region. Impression: Mild lateral epicondylitis but otherwise unremarkable     Impression and Recommendations:     The above documentation has been  reviewed and is accurate and complete Kim Pulley, DO

## 2022-04-25 ENCOUNTER — Ambulatory Visit (INDEPENDENT_AMBULATORY_CARE_PROVIDER_SITE_OTHER): Payer: BC Managed Care – PPO

## 2022-04-25 ENCOUNTER — Ambulatory Visit: Payer: BC Managed Care – PPO | Admitting: Family Medicine

## 2022-04-25 ENCOUNTER — Ambulatory Visit: Payer: Self-pay

## 2022-04-25 VITALS — BP 100/72 | HR 56 | Ht 69.0 in | Wt 149.0 lb

## 2022-04-25 DIAGNOSIS — M25521 Pain in right elbow: Secondary | ICD-10-CM

## 2022-04-25 DIAGNOSIS — G540 Brachial plexus disorders: Secondary | ICD-10-CM

## 2022-04-25 MED ORDER — GABAPENTIN 100 MG PO CAPS: 200.0000 mg | ORAL_CAPSULE | Freq: Every day | ORAL | 0 refills | Status: DC

## 2022-04-25 NOTE — Assessment & Plan Note (Signed)
Patient is having thoracic outlet syndrome.  Status post radiation that is concerning as well as multiple lymph nodes removed.  Do believe that this could have caused some possible neurologic aspect or some scarring that could be causing some impingement.  When reviewing the ultrasounds there is very mild tendinitis of the lateral epicondylar region and does have a normal variant as well as anemia.  I do not think it is contributing.  Most pain is more proximal.  Patient will try to continue to work with physical therapy for the outlet syndrome, given home exercises as well, started gabapentin, will get x-rays of the chest to make sure nothing else is contributing at this moment.  Worsening pain consider advanced imaging secondary to the history.  Follow-up again in 6 to 8 weeks

## 2022-04-25 NOTE — Patient Instructions (Signed)
Exercises Tell Kim [name] to work on TOS Gabapentin '200mg'$  at night Chest xray today plus elbow Vit C '1000mg'$  B Complex See me in 5-6 weeks

## 2022-04-29 ENCOUNTER — Other Ambulatory Visit: Payer: Self-pay | Admitting: Hematology

## 2022-05-31 ENCOUNTER — Ambulatory Visit (INDEPENDENT_AMBULATORY_CARE_PROVIDER_SITE_OTHER): Payer: BC Managed Care – PPO

## 2022-05-31 ENCOUNTER — Ambulatory Visit: Payer: BC Managed Care – PPO | Admitting: Family Medicine

## 2022-05-31 VITALS — BP 108/70 | HR 65 | Ht 69.0 in | Wt 153.0 lb

## 2022-05-31 DIAGNOSIS — M79601 Pain in right arm: Secondary | ICD-10-CM

## 2022-05-31 DIAGNOSIS — M542 Cervicalgia: Secondary | ICD-10-CM | POA: Diagnosis not present

## 2022-05-31 NOTE — Patient Instructions (Addendum)
Great to see you  Lets get a neck xray as well  I am ordering a Nerve conduction test for the right upper extremity  Keep doing the gabapentin for now I will send a message to your oncologist to discuss another CT vs a chest MRI  We will get this info and discuss follow up as soon as I get the info

## 2022-05-31 NOTE — Progress Notes (Signed)
Kim [name] Phone: [phone] Subjective:   Kim [name], am serving as a scribe for Dr. Hulan [name].  I'm seeing this patient by the request  of:  Kim Small, MD  CC: Chest discomfort and leg discomfort  [id number]  04/25/2022 Patient is having thoracic outlet syndrome. Status post radiation that is concerning as well as multiple lymph nodes removed. Do believe that this could have caused some possible neurologic aspect or some scarring that could be causing some impingement. When reviewing the ultrasounds there is very mild tendinitis of the lateral epicondylar region and does have a normal variant as well as anemia. I do not think it is contributing. Most pain is more proximal. Patient will try to continue to work with physical therapy for the outlet syndrome, given home exercises as well, started gabapentin, will get x-rays of the chest to make sure nothing else is contributing at this moment. Worsening pain consider advanced imaging secondary to the history. Follow-up again in 6 to 8 weeks   Updated 05/31/2022 Kim [name] is a 47 y.o. female coming in with complaint of wrist pain and thoracic outlet syndrome. Patient states that she is doing better. Gabapentin makes her groggy in mornings so she discontinued medication up until a few days ago. She does feel like medication is now helpful. [name] longer having palpable pain over lateral epicondyle. Less nerve pain down her arm. Pain continues with pronation and supination with weight in her hand. Sees Kim [name] for PT. Also doing our HEP and she feels back is stronger. Notes tightness in the pec on R side [name] matter how much she stretches. Also has achy soreness in pec origin, in armpit and into the R side of her ribs. Also has numbness in the back of R scapula that is the size of a penny that has been like this since her surgery.     Past Medical History:   Diagnosis Date   Acid reflux    Acute bronchitis    Anemia    iron infusions   Anxiety    Asthma    rare   Cancer ([place])    Depression    Family history of brain cancer    Family history of breast cancer    Family history of prostate cancer    Migraine    Past Surgical History:  Procedure Laterality Date   BREAST BIOPSY Right 05/23/2018   x3, malignant   DILATION AND CURETTAGE OF UTERUS     2006 x1, 2009 x2   LAPAROTOMY  2008   PORTACATH PLACEMENT Left 06/06/2018   Procedure: INSERTION Kim-A-CATH POSSIBLE ULTRASOUND;  Surgeon: Kim Kussmaul, MD;  Location: [facility];  Service: General;  Laterality: Left;   ROBOTIC ASSISTED TOTAL HYSTERECTOMY WITH BILATERAL SALPINGO OOPHERECTOMY Bilateral 03/21/2020   Procedure: XI ROBOTIC ASSISTED TOTAL HYSTERECTOMY WITH BILATERAL SALPINGO OOPHORECTOMY/CYSTOSCOPY,Pelvic Washing;  Surgeon: Kim Gell, MD;  Location: [facility];  Service: Gynecology;  Laterality: Bilateral;  Kim S. to RNFA confirmed on 03/03/20 CS   Social History   Socioeconomic History   Marital status: Married    Spouse name: Not on file   Number of children: 3   Years of education: Not on file   Highest education level: Not on file  Occupational History   Not on file  Tobacco Use   Smoking status: Never   Smokeless tobacco: Never  Vaping Use   Vaping  Use: Never used  Substance and Sexual Activity   Alcohol use: Not Currently    Comment: 3-4 drinks a week, wine and beer    Drug use: [name]   Sexual activity: Not Currently    Birth control/protection: I.U.D.  Other Topics Concern   Not on file  Social History Narrative   Not on file   Social Determinants of Health   Financial Resource Strain: Not on file  Food Insecurity: Not on file  Transportation Needs: Not on file  Physical Activity: Not on file  Stress: Not on file  Social Connections: Not on file   [name] Known Allergies Family History  Problem Relation Age of  Onset   Breast cancer Mother    Cancer Mother    Hyperlipidemia Father    Cancer Sister 56       Astrocytoma and Meningioma   Cancer Maternal Aunt        pitutory cancer   Breast cancer Maternal Great-grandmother 65       MGFs mother   Prostate cancer Maternal Grandfather 63   Prostate cancer Maternal Uncle 61   Skin cancer Maternal Grandmother    Cancer - Other Other        pituitary, MGMs sister, died in her 23s   Breast cancer Other        PGFs sister         Current Outpatient Medications (Other):    ALPRAZolam (XANAX) 0.5 MG tablet, Take 0.25-0.5 mg by mouth at bedtime as needed for anxiety.    CALCIUM-MAGNESIUM-VITAMIN D PO, Take 1 tablet by mouth daily.   exemestane (AROMASIN) 25 MG tablet, TAKE 1 TABLET BY MOUTH EVERY DAY AFTER BREAKFAST   gabapentin (NEURONTIN) 100 MG capsule, Take 2 capsules (200 mg total) by mouth at bedtime.   melatonin 5 MG TABS, Take 5 mg by mouth at bedtime.    senna (SENOKOT) 8.6 MG tablet, Take 1 tablet by mouth daily.   sertraline (ZOLOFT) 100 MG tablet, TAKE 1 TABLET BY MOUTH EVERY DAY    Review of Systems:  [name] headache, visual changes, nausea, vomiting, diarrhea, constipation, dizziness, abdominal pain, skin rash, fevers, chills, night sweats, weight loss, swollen lymph nodes, body aches, joint swelling, chest pain, shortness of breath, mood changes. POSITIVE muscle aches  Objective  Blood pressure 108/70, pulse 65, height 5' 9"$  (1.753 m), weight 153 lb (69.4 kg), SpO2 98 %, unknown if currently breastfeeding.   General: [name] apparent distress alert and oriented x3 mood and affect normal, dressed appropriately.  HEENT: Pupils equal, extraocular movements intact  Respiratory: Patient's speak in full sentences and does not appear short of breath  Cardiovascular: [name] lower extremity edema, non tender, [name] erythema  Right shoulder exam does show that patient does have signs with the thoracic outlet with compression.  Patient does have good  strength of the arm though noted.  Still tender to palpation of the radial nerve.  Neck exam to have a negative Spurling    Impression and Recommendations:    The above documentation has been reviewed and is accurate and complete Kim Pulley, DO

## 2022-06-01 NOTE — Progress Notes (Unsigned)
Kim [name]   [phone]) [phone] Fax:(336) [phone]   Clinic Follow up Note   Patient Care Team: [name], MD as PCP - General (Family Medicine) [name], MD as Consulting Physician (General Surgery) [name], MD as Consulting Physician (Hematology) [name], MD as Consulting Physician (Radiation Oncology) [name], MD as Consulting Physician (Obstetrics and Gynecology) [name], RN as Oncology Nurse Navigator [name], RN as Oncology Nurse Navigator [name], NP as Nurse Practitioner (Nurse Practitioner)  Date of Service:  06/04/2022  CHIEF COMPLAINT: f/u of multifocal right breast cancer   CURRENT THERAPY:   Antiestrogen therapy started 02/16/19 with Letrozole 2.5 mg, switched to Exemestane 01/2021   ASSESSMENT:  Kim [name] is a 47 y.o. 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   -s/p prophylactic left mastectomy and b/l reconstruction 11/24/19 by Dr. Tera Mater [facility]). -she completed 1 year Nerlynx in 09/2020 -she started Letrozole in 02/2019 (monthly Zoladex 12/2018-02/2020 until BSO 03/21/20). She developed worsening joint stiffness and switched to exemestane 01/16/21. Tolerating well overall with improved joint pain. -she is clinically doing very well. There is no clinical concern for recurrence. -continue exemestane and surveillance; she is s/p b/l mastectomies. -f/u in 6 months with next Zometa  Osteopenia -baseline DEXA from 07/21/19 shows osteopenia with T-score -1.3 at left femur neck. -repeat DEXA 08/31/21 showed some worsening with T-score of -1.9, which is getting worse. She endorses taking OTC calcium and vitamin D, and does weightbearing  exercise regularly.  -she started Zometa in 11/2021. Plan for every 6 months for 2 years.   Monoallelic mutation of PALB2 gene -Genetic testing from [facility] done 05/2018 was positive for PALB2 mutation. She has discovered a stronger maternal family history of prostate and breast cancer in her 38st, 2nd and 3rd degree relatives, Including her mother who had breast cancer also.  -We discussed the risk of future breast cancer from PALB2 mutation. Given her family history, she is s/p b/l mastectomy and reconstruction (10/21/18 and 11/24/19) and s/p BSO with hysterectomy (03/21/20).  -Her baseline 06/2020 colonoscopy/endoscopy was benign per patient.  R arm pain -Follow-up with sports medicine Dr. Tamala Julian -Continue physical therapy -We also discussed dry needling   PLAN: -lab reviewed -Continue Exemestane -Proceed with Zometa today -lab, flush and Zometa  in 6 months/  SUMMARY OF ONCOLOGIC HISTORY: Oncology History Overview Note  Cancer Staging Cancer of overlapping sites of right female breast [facility]) Staging form: Breast, AJCC 8th Edition - Clinical stage from 05/28/2018: Stage IB (cT2, cN1, cM0, G3, ER+, PR+, HER2+) - Signed by [name], MD on 05/28/2018  Malignant neoplasm of lower-outer quadrant of right breast of female, estrogen receptor positive ([place]) Staging form: Breast, AJCC 8th Edition - Clinical stage from 05/28/2018: Stage IIB (cT2, cN1, cM0, G3, ER+, PR+, HER2-) - Unsigned     Cancer of overlapping sites of right female breast ([place])  05/14/2018 Mammogram   Right breast mammogram 05/14/18  IMPRESSION  There is a 1.0 x 0.5 x 1.2 cm lobular hypoechoic mass right breast 7 o'clock position 3 cm from nipple, a 0.8 x 0.6 x 1.2 cm lobular hypoechoic mass right breast 7 o'clock position 2 cm from the nipple, and a 0.9 x 0.5 x 0.8 cm lobular hypoechoic mass  right breast 5 o'clock position 3 cm from nipple.   There is a 3.8 x 1.0 x 1.3 cm lobular hypoechoic mass right breast 5 o'clock  position 2 cm from nipple.   Multiple thickened right axillary lymph nodes (approximately 15). Measuring up to 1.0 cm in thickness. Adenopathy corresponds with axillary palpable abnormality.  Indeterminate round and punctate calcifications upper-outer quadrant right breast.Indeterminate round and punctate calcifications upper-outer quadrant right breast.   05/21/2018 Initial Biopsy   Diagnosis 05/21/18  1. Breast, right, needle core biopsy, axilla, 5 o'clock, ribbon clip - INVASIVE MAMMARY CARCINOMA. - MAMMARY CARCINOMA IN SITU. 2. Breast, right, needle core biopsy, 7 o'clock, coil clip - INVASIVE MAMMARY CARCINOMA. - MAMMARY CARCINOMA IN SITU. 3. Lymph node, needle/core biopsy, right axillary LN, hydromark - METASTATIC MAMMARY CARCINOMA.    05/21/2018 Receptors her2   1. PROGNOSTIC INDICATORS Results: The tumor cells are EQUIVOCAL for Her2 (2+);  HER2 **NEGATIVE** by FISH   Estrogen Receptor: 80%, POSITIVE, MODERATE STAINING INTENSITY Progesterone Receptor: 5%, POSITIVE, MODERATE-WEAK STAINING INTENSITY Proliferation Marker Ki67: 10%  3. PROGNOSTIC INDICATORS Results: The tumor cells are POSITIVE for Her2 (3+). Estrogen Receptor: 90%, POSITIVE, STRONG STAINING INTENSITY Progesterone Receptor: 50%, POSITIVE, MODERATE STAINING INTENSITY Proliferation Marker Ki67: 5%   05/26/2018 Initial Diagnosis   Malignant neoplasm of lower-inner quadrant of right breast of female, estrogen receptor positive ([place])   05/27/2018 Mammogram   Left breast mammogram 05/27/18  IMPRESSION: No mammographic evidence of LEFT breast malignancy.   05/28/2018 Cancer Staging   Staging form: Breast, AJCC 8th Edition - Clinical stage from 05/28/2018: Stage IB (cT2, cN1, cM0, G3, ER+, PR+, HER2+) - Signed by [name], MD on 05/28/2018   06/03/2018 Imaging   MR BREAST BILATERAL W WO CONTRAST INC CAD  IMPRESSION: 1. Large enhancing masses and non mass enhancement identified throughout the majority of the  right breast. Additionally, there are smaller masses identified within the upper inner and lower inner right breast. Overall findings are concerning for extensive malignancy throughout all 4 quadrants of the right breast. 2. Extensive bulky lymphadenopathy identified within the right axilla. The matted appearance makes counting the number of abnormal lymph nodes difficult as they are immediately approximated to each other. 3. At least one abnormal appearing right internal mammary lymph node. 4. Nonenhancing lesion within the sternum with associated sternal marrow heterogeneity, raising the possibility of osseous metastatic disease within the sternum. 5. Two enhancing masses within the left breast as described above.     06/05/2018 PET scan   NM PET Image Initial (PI) Skull Base To Thigh  IMPRESSION: 1. Multiple borderline enlarged right axillary lymph nodes identified exhibiting mild to moderate increased uptake. Can not rule out nodal metastasis. Correlation with biopsy recommended. 2. Asymmetric increased uptake within the right breast corresponding to MRI abnormality. 3. Subcentimeter right retropectoral and right internal mammary lymph nodes are again identified and exhibit nonspecific, low level FDG uptake. 4. Noncalcified solid nodule within the lateral right lower lobe measures 9 mm without significant radiotracer uptake. 5. No hypermetabolic osseous lesions identified.     06/09/2018 - 11/03/2018 Chemotherapy   Neoadjuvant chemotherapy TCHP, 06/09/2018-09/22/18. Followed by Maintenance Herceptin/Perjeta q3weeks starting 10/14/18 to continue 1 year treatment. Stopped Herceptin/Perjeta on 11/03/18 due to multiple Positive LN of pathology.    06/10/2018 Pathology Results   Surgical pathology  Diagnosis 1. Breast, left, needle core biopsy, LOQ, barbell - MILD FIBROCYSTIC CHANGES. 2. Breast, left, needle core biopsy, UIQ cylinder - MILD FIBROCYSTIC CHANGES.    06/13/2018  Genetic Testing   The genetic testing reported on June 13, 2018 through the multi-Cancer Panel offered by [facility] identified a single, heterozygous pathogenic gene mutation called PALB2, c.3113G>A. There were no deleterious mutations in AIP, ALK, APC, ATM, AXIN2,BAP1,  BARD1, BLM, BMPR1A, BRCA1, BRCA2, BRIP1, CASR, CDC73, CDH1, CDK4, CDKN1B, CDKN1C, CDKN2A (p14ARF), CDKN2A (p16INK4a), CEBPA, CHEK2, CTNNA1, DICER1, DIS3L2, EGFR (c.2369C>T, p.Thr790Met variant only), EPCAM (Deletion/duplication testing only), FH, FLCN, GATA2, GPC3, GREM1 (Promoter region deletion/duplication testing only), HOXB13 (c.251G>A, p.Gly84Glu), HRAS, KIT, MAX, MEN1, MET, MITF (c.952G>A, p.Glu318Lys variant only), MLH1, MSH2, MSH3, MSH6, MUTYH, NBN, NF1, NF2, NTHL1, PDGFRA, PHOX2B, PMS2, POLD1, POLE, POT1, PRKAR1A, PTCH1, PTEN, RAD50, RAD51C, RAD51D, RB1, RECQL4, RET, RUNX1, SDHAF2, SDHA (sequence changes only), SDHB, SDHC, SDHD, SMAD4, SMARCA4, SMARCB1, SMARCE1, STK11, SUFU, TERC, TERT, TMEM127, TP53, TSC1, TSC2, VHL, WRN and WT1.  .   09/24/2018 Breast MRI   Breast [facility] from [facility] on 09/24/18 Right breast: Signal void inferior right breast, posterior depth from biopsy marker clip. Interval decreased size of the right breast, and now symmetric to the left, compared to prior MRI. Resolution of abnormal nonmass enhancement without evidence of residual enhancing mass. No evidence of axillary or internal mammary lymphadenopathy is seen, markedly improved from prior. There is no abnormal skin, nipple, or pectoralis muscle enhancement.  Left breast: There are no suspicious enhancing masses or areas of non-mass enhancement. No evidence of axillary or internal mammary lymphadenopathy is seen. There is no abnormal skin, nipple, or pectoralis muscle enhancement.   10/09/2018 Imaging   CT chest W Contrast 10/09/18  IMPRESSION: 1. Stable 10 mm right lower lobe pulmonary nodule since 06/05/2018. Nodule is not substantially hypermetabolic on the  PET-CT. Continued attention on follow-up recommended. 2. No findings to suggest metastatic disease in the thorax.   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 and surrounding tissue - Biopsy clips identified - Margin status (see extended anterior margin below)  Invasive carcinoma: Positive anterior-inferior margin  Ductal carcinoma in situ: Positive anterior-inferior margin - Ancillary studies previously reported on [id number](5:00, ribbon clip) Estrogen receptor: Positive (80%) Progesterone receptor: Positive (10%) HER2 IHC: Equivocal (2+) HER2 FISH: Negative HER2/CEP17 ratio: 1.35 Mean HER2 copy number: 1.55 - Ancillary studies previously reported on [id number] (right axillary lymph node) Estrogen receptor: Positive (90%) Progesterone receptor: Positive (20%) HER2 IHC: Positive (3+) - Residual Cancer Burden  Tumor bed size: 41 mm x 20 mm Overall tumor cellularity: 10% Percentage in situ: 60% Number of involved lymph nodes: 7 Diameter of largest metastasis: 6 mm Residual Cancer Burden Score: 3.108 Residual Cancer Burden Class: RCB-II - Other findings: Atypical lobular hyperplasia and columnar cell change with microcalcifications; fibroadenomas with microcalcifications  B: Right axilla, targeted lymph node excision - Six of seven lymph nodes positive for carcinoma (6/7) - Size of largest tumor deposit: 6 mm - Biopsy site identified - Treatment effect: Present - Extracapsular extension: Absent  C: Right axilla, lymph node dissection - One of seven lymph nodes positive for carcinoma, macrometastasis, 6 mm (1/7) - Treatment effect: Absent - Extracapsular  extension: Absent  D: Right breast, nipple core biopsy - Negative for carcinoma  E: Breast, right, anterior margin, excision - Microscopic  foci of ductal carcinoma in situ - Margin: Negative, <1 mm    11/05/2018 Cancer Staging   Staging form: Breast, AJCC 8th Edition - Pathologic stage from 11/05/2018: No Stage Recommended (ypT1c, pN2a, cM0, G2, ER+, PR+, HER2-) - Signed by [name], MD on 11/23/2018   11/24/2018 - 08/24/2019 Chemotherapy   Due to positive LN on surgical pathology her maintance Herceptin/Perjeta was changed to [place] for about 14 cycles on 11/24/18. Completed on 08/24/19   12/24/2018 - 02/03/2019 Radiation Therapy   adjuvant radiation at [facility] on 12/24/18 and completed on 02/03/19.    01/23/2019 Imaging   CT Chest wo  IMPRESSION: 1. Stable right lower lobe pulmonary nodule. 2. No signs of new or suspicious finding since study of 10/09/2018, now status post right mastectomy, axillary dissection and breast reconstruction.   02/16/2019 -  Anti-estrogen oral therapy   -Monthly Zoladex injection started 12/19/18-02/22/20. Stopped after BSO surgery in 03/2020 -Letrozole 2.5 mg started 02/16/19    04/20/2019 Survivorship   SCP delivered by Cira Rue, NP    09/22/2019 - 09/2020 Chemotherapy   Nerlynx starting at 4 tablets (160 mg total) by mouth daily for 7 days, THEN 5 tablets (200 mg total) daily for 7 days, THEN 6 tablets (240 mg total) daily for 16 days, starting on 09/22/19. Stop mid June.    11/24/2019 Surgery   left prophylactic mastectomy and bilateral reconstruction at [facility] by Dr Dorann Ou 11/24/19.   03/21/2020 Surgery   XI ROBOTIC ASSISTED TOTAL HYSTERECTOMY WITH BILATERAL SALPINGO OOPHORECTOMY/CYSTOSCOPY, Pelvic Washing by Dr Emilia Beck  A. PERITONEAL, WASHING:    FINAL MICROSCOPIC DIAGNOSIS:  - Reactive mesothelial cells present    FINAL MICROSCOPIC DIAGNOSIS:   A. UTERUS, CERVIX, BILATERAL TUBE AND OVARIES:  - Uterus:        Endometrium: Inactive endometrium. No hyperplasia or malignancy.       Myometrium: Unremarkable. No malignancy.       Serosa: Unremarkable. No malignancy.  - Cervix: Benign squamous and endocervical mucosa. No dysplasia or  malignancy.  - Bilateral ovaries: Unremarkable. No malignancy.  - Bilateral fallopian tubes: Unremarkable. No malignancy.     05/01/2021 Imaging   EXAM: CT CHEST WITHOUT CONTRAST  IMPRESSION: 1. Stable 9 x 7 mm peripheral right lower lobe pulmonary nodule. This is unchanged comparing back to PET-CT 06/05/2018 when showed no substantial hypermetabolism. Given stability over greater than 2 years, this is compatible with benign etiology. 2. Stable appearance of hepatic cysts.      INTERVAL HISTORY:  Kim [name] is here for a follow up of  multifocal right breast cancer She was last seen by me on  She presents to the clinic alone.Pt states everything is going ok.Pt states no physical change, but she is still chronically constipated. Pt states she has been having arm pain in her right arm. Pt state she has been having physical therapy on the right arm and when she twist or lift and object it hurts and is tender.Pt state she also plays the violin and she is right handed. Pt denies having pain any where else. Pt state first dose of Zometa went well had some joint pain.     All other systems were reviewed with the patient and are negative.  MEDICAL HISTORY:  Past Medical History:  Diagnosis Date   Acid reflux    Acute bronchitis    Anemia    iron infusions   Anxiety    Asthma    rare  [facility]cer [facility])    Depression    Family history of brain cancer    Family history of breast cancer    Family history of prostate cancer    Migraine     SURGICAL HISTORY: Past Surgical History:  Procedure Laterality Date   BREAST BIOPSY Right 05/23/2018   x3, malignant   DILATION AND CURETTAGE OF UTERUS     2006 x1, 2009 x2   LAPAROTOMY  2008   PORTACATH PLACEMENT  Left 06/06/2018   Procedure: INSERTION PORT-A-CATH POSSIBLE ULTRASOUND;  Surgeon: [name], MD;  Location: [place];  Service: General;  Laterality: Left;   ROBOTIC ASSISTED TOTAL HYSTERECTOMY WITH BILATERAL SALPINGO OOPHERECTOMY Bilateral 03/21/2020   Procedure: XI ROBOTIC ASSISTED TOTAL HYSTERECTOMY WITH BILATERAL SALPINGO OOPHORECTOMY/CYSTOSCOPY,Pelvic Washing;  Surgeon: [name], MD;  Location: [place];  Service: Gynecology;  Laterality: Bilateral;  [name] to RNFA confirmed on 03/03/20 CS    I have reviewed the social history and family history with the patient and they are unchanged from previous note.  ALLERGIES:  has No Known Allergies.  MEDICATIONS:  Current Outpatient Medications  Medication Sig Dispense Refill   ALPRAZolam (XANAX) 0.5 MG tablet Take 0.25-0.5 mg by mouth at bedtime as needed for anxiety.      CALCIUM-MAGNESIUM-VITAMIN D PO Take 1 tablet by mouth daily.     exemestane (AROMASIN) 25 MG tablet TAKE 1 TABLET BY MOUTH EVERY DAY AFTER BREAKFAST 90 tablet 1   gabapentin (NEURONTIN) 100 MG capsule Take 2 capsules (200 mg total) by mouth at bedtime. 180 capsule 0   melatonin 5 MG TABS Take 5 mg by mouth at bedtime.      senna (SENOKOT) 8.6 MG tablet Take 1 tablet by mouth daily.     sertraline (ZOLOFT) 100 MG tablet TAKE 1 TABLET BY MOUTH EVERY DAY 90 tablet 2   No current facility-administered medications for this visit.    PHYSICAL EXAMINATION: ECOG PERFORMANCE STATUS: 1 - Symptomatic but completely ambulatory  Vitals:   06/04/22 0831  BP: 108/74  Pulse: 62  Resp: 14  Temp: 98 F (36.7 C)  SpO2: 100%   Wt Readings from Last 3 Encounters:  06/04/22 155 lb 3.2 oz (70.4 kg)  05/31/22 153 lb (69.4 kg)  04/25/22 149 lb (67.6 kg)     NECK:(-)  supple, thyroid normal size, non-tender, without nodularity LYMPH:(-)  no palpable lymphadenopathy in the cervical, axillary  LUNGS:(-) clear to auscultation and  percussion with normal breathing effort HEART: (-)regular rate & rhythm and no murmurs and no lower extremity edema BREAST: RT Breast implant ,slightly bruise right shoulder tightness from scar tissue..no palpable mass, breast exam benign. Lt Breast exam no palpable mass , breast exam benign.     LABORATORY DATA:  I have reviewed the data as listed    Latest Ref Rng & Units 06/04/2022    8:09 AM 11/30/2021    8:21 AM 05/01/2021    9:53 AM  CBC  WBC 4.0 - 10.5 K/uL 3.1  2.5  2.7   Hemoglobin 12.0 - 15.0 g/dL 13.5  12.7  12.6   Hematocrit 36.0 - 46.0 % 40.8  36.7  38.3   Platelets 150 - 400 K/uL 171  158  156         Latest Ref Rng & Units 06/04/2022    8:09 AM 11/30/2021    8:21 AM 05/01/2021    9:53 AM  CMP  Glucose 70 - 99 mg/dL 64  99  73   BUN 6 - 20 mg/dL 15  16  15   $ Creatinine 0.44 - 1.00 mg/dL 0.78  0.95  0.76   Sodium 135 - 145 mmol/L 140  139  141   Potassium 3.5 - 5.1 mmol/L 4.0  4.3  4.1   Chloride 98 - 111 mmol/L 104  104  104   CO2 22 - 32 mmol/L 31  30  31   $ Calcium 8.9 - 10.3 mg/dL 9.7  10.3  10.0   Total Protein 6.5 - 8.1 g/dL 7.5  7.2  7.3   Total Bilirubin 0.3 - 1.2 mg/dL 0.5  0.5  0.6   Alkaline Phos 38 - 126 U/L 49  58  65   AST 15 - 41 U/L 13  12  11   $ ALT 0 - 44 U/L 22  15  18       $ RADIOGRAPHIC STUDIES: I have personally reviewed the radiological images as listed and agreed with the findings in the report. No results found.    No orders of the defined types were placed in this encounter.  All questions were answered. The patient knows to call the clinic with any problems, questions or concerns. No barriers to learning was detected. The total time spent in the appointment was 30 minutes.     [name], MD 06/04/2022   Felicity Coyer, CMA, am acting as scribe for [name], MD.   I have reviewed the above documentation for accuracy and completeness, and I agree with the above.

## 2022-06-03 DIAGNOSIS — Z1509 Genetic susceptibility to other malignant neoplasm: Secondary | ICD-10-CM | POA: Insufficient documentation

## 2022-06-03 DIAGNOSIS — Z1589 Genetic susceptibility to other disease: Secondary | ICD-10-CM | POA: Insufficient documentation

## 2022-06-03 NOTE — Assessment & Plan Note (Signed)
cT2N1Mx, ER+/PR+, HER2- in breast, HER2(+) in node, Grade II-III, ypT1cN2a -diagnosed in 05/2018, treated with Neoadjuvant TCHP, right mastectomy, adjuvant radiation through [facility], and maintenance Herceptin/Perjeta then 14 cycles of Kadcyla.   -s/p prophylactic left mastectomy and b/l reconstruction 11/24/19 by Dr. Tera Mater [facility]). -she completed 1 year Nerlynx in 09/2020 -she started Letrozole in 02/2019 (monthly Zoladex 12/2018-02/2020 until BSO 03/21/20). She developed worsening joint stiffness and switched to exemestane 01/16/21. Tolerating well overall with improved joint pain. -she is clinically doing very well. There is no clinical concern for recurrence. -continue exemestane and surveillance; she is s/p b/l mastectomies. -f/u in 6 months with next Zometa

## 2022-06-03 NOTE — Assessment & Plan Note (Signed)
-  Genetic testing from [facility] done 05/2018 was positive for PALB2 mutation. She has discovered a stronger maternal family history of prostate and breast cancer in her 22st, 2nd and 3rd degree relatives, Including her mother who had breast cancer also.  -We discussed the risk of future breast cancer from PALB2 mutation. Given her family history, she is s/p b/l mastectomy and reconstruction (10/21/18 and 11/24/19) and s/p BSO with hysterectomy (03/21/20).  -Her baseline 06/2020 colonoscopy/endoscopy was benign per patient.

## 2022-06-03 NOTE — Assessment & Plan Note (Signed)
-  baseline DEXA from 07/21/19 shows osteopenia with T-score -1.3 at left femur neck. -repeat DEXA 08/31/21 showed some worsening with T-score of -1.9, which is getting worse. She endorses taking OTC calcium and vitamin D, and does weightbearing exercise regularly.  -she started Zometa in 11/2021. Plan for every 6 months for 2 years.

## 2022-06-04 ENCOUNTER — Encounter: Payer: Self-pay | Admitting: Neurology

## 2022-06-04 ENCOUNTER — Inpatient Hospital Stay: Payer: BC Managed Care – PPO | Attending: Nurse Practitioner

## 2022-06-04 ENCOUNTER — Encounter: Payer: Self-pay | Admitting: Hematology

## 2022-06-04 ENCOUNTER — Other Ambulatory Visit: Payer: Self-pay

## 2022-06-04 ENCOUNTER — Inpatient Hospital Stay (HOSPITAL_BASED_OUTPATIENT_CLINIC_OR_DEPARTMENT_OTHER): Payer: BC Managed Care – PPO | Admitting: Hematology

## 2022-06-04 ENCOUNTER — Inpatient Hospital Stay: Payer: BC Managed Care – PPO

## 2022-06-04 VITALS — BP 108/74 | HR 62 | Temp 98.0°F | Resp 14 | Ht 69.0 in | Wt 155.2 lb

## 2022-06-04 VITALS — BP 106/72 | HR 52 | Resp 16

## 2022-06-04 DIAGNOSIS — Z1501 Genetic susceptibility to malignant neoplasm of breast: Secondary | ICD-10-CM

## 2022-06-04 DIAGNOSIS — Z170 Estrogen receptor positive status [ER+]: Secondary | ICD-10-CM

## 2022-06-04 DIAGNOSIS — M8580 Other specified disorders of bone density and structure, unspecified site: Secondary | ICD-10-CM

## 2022-06-04 DIAGNOSIS — Z79811 Long term (current) use of aromatase inhibitors: Secondary | ICD-10-CM | POA: Diagnosis not present

## 2022-06-04 DIAGNOSIS — M85852 Other specified disorders of bone density and structure, left thigh: Secondary | ICD-10-CM | POA: Diagnosis not present

## 2022-06-04 DIAGNOSIS — Z1509 Genetic susceptibility to other malignant neoplasm: Secondary | ICD-10-CM | POA: Diagnosis not present

## 2022-06-04 DIAGNOSIS — Z1589 Genetic susceptibility to other disease: Secondary | ICD-10-CM

## 2022-06-04 DIAGNOSIS — C50811 Malignant neoplasm of overlapping sites of right female breast: Secondary | ICD-10-CM

## 2022-06-04 LAB — CBC WITH DIFFERENTIAL (CANCER CENTER ONLY)
Abs Immature Granulocytes: 0.01 10*3/uL (ref 0.00–0.07)
Basophils Absolute: 0 10*3/uL (ref 0.0–0.1)
Basophils Relative: 1 %
Eosinophils Absolute: 0.2 10*3/uL (ref 0.0–0.5)
Eosinophils Relative: 5 %
HCT: 40.8 % (ref 36.0–46.0)
Hemoglobin: 13.5 g/dL (ref 12.0–15.0)
Immature Granulocytes: 0 %
Lymphocytes Relative: 29 %
Lymphs Abs: 0.9 10*3/uL (ref 0.7–4.0)
MCH: 31.3 pg (ref 26.0–34.0)
MCHC: 33.1 g/dL (ref 30.0–36.0)
MCV: 94.7 fL (ref 80.0–100.0)
Monocytes Absolute: 0.2 10*3/uL (ref 0.1–1.0)
Monocytes Relative: 7 %
Neutro Abs: 1.8 10*3/uL (ref 1.7–7.7)
Neutrophils Relative %: 58 %
Platelet Count: 171 10*3/uL (ref 150–400)
RBC: 4.31 MIL/uL (ref 3.87–5.11)
RDW: 13 % (ref 11.5–15.5)
WBC Count: 3.1 10*3/uL — ABNORMAL LOW (ref 4.0–10.5)
nRBC: 0 % (ref 0.0–0.2)

## 2022-06-04 LAB — CMP (CANCER CENTER ONLY)
ALT: 22 U/L (ref 0–44)
AST: 13 U/L — ABNORMAL LOW (ref 15–41)
Albumin: 4.7 g/dL (ref 3.5–5.0)
Alkaline Phosphatase: 49 U/L (ref 38–126)
Anion gap: 5 (ref 5–15)
BUN: 15 mg/dL (ref 6–20)
CO2: 31 mmol/L (ref 22–32)
Calcium: 9.7 mg/dL (ref 8.9–10.3)
Chloride: 104 mmol/L (ref 98–111)
Creatinine: 0.78 mg/dL (ref 0.44–1.00)
GFR, Estimated: >60 mL/min (ref >60)
Glucose, Bld: 64 mg/dL — ABNORMAL LOW (ref 70–99)
Potassium: 4 mmol/L (ref 3.5–5.1)
Sodium: 140 mmol/L (ref 135–145)
Total Bilirubin: 0.5 mg/dL (ref 0.3–1.2)
Total Protein: 7.5 g/dL (ref 6.5–8.1)

## 2022-06-04 MED ORDER — SODIUM CHLORIDE 0.9 % IV SOLN: Freq: Once | INTRAVENOUS | Status: AC

## 2022-06-04 MED ORDER — ZOLEDRONIC ACID 4 MG/100ML IV SOLN
4.0000 mg | Freq: Once | INTRAVENOUS | Status: AC
  Administered 2022-06-04: 4 mg via INTRAVENOUS
  Filled 2022-06-04: qty 100

## 2022-06-04 NOTE — Patient Instructions (Signed)

## 2022-06-04 NOTE — Progress Notes (Signed)
Patient declined any upcoming dental procedures. Educated patient on importance of informing dentist that she is receiving this medication. Patient verbalized an understanding.

## 2022-06-05 ENCOUNTER — Encounter: Payer: Self-pay | Admitting: Family Medicine

## 2022-06-05 ENCOUNTER — Other Ambulatory Visit: Payer: Self-pay

## 2022-06-05 DIAGNOSIS — R079 Chest pain, unspecified: Secondary | ICD-10-CM

## 2022-06-17 ENCOUNTER — Inpatient Hospital Stay: Admission: RE | Admit: 2022-06-17 | Payer: BC Managed Care – PPO | Source: Ambulatory Visit

## 2022-06-18 ENCOUNTER — Telehealth: Payer: Self-pay

## 2022-06-18 NOTE — Telephone Encounter (Signed)
[facility] needs the order to be with and without contrast for this type of exam. This is already approved for without so needing to get with contrast authorized thorough insurance.

## 2022-06-28 ENCOUNTER — Other Ambulatory Visit: Payer: Self-pay

## 2022-06-28 DIAGNOSIS — R202 Paresthesia of skin: Secondary | ICD-10-CM

## 2022-06-29 ENCOUNTER — Ambulatory Visit (INDEPENDENT_AMBULATORY_CARE_PROVIDER_SITE_OTHER): Payer: BC Managed Care – PPO | Admitting: Neurology

## 2022-06-29 DIAGNOSIS — R202 Paresthesia of skin: Secondary | ICD-10-CM

## 2022-06-29 DIAGNOSIS — G5601 Carpal tunnel syndrome, right upper limb: Secondary | ICD-10-CM

## 2022-06-29 NOTE — Procedures (Signed)
  [facility]  [place], [place]  [address] [place], [place] [address] Tel: [phone] Fax: [phone] Test Date:  06/29/2022  Patient: Kim [name] DOB: 02-01-1976 Physician: Narda Amber, DO  Sex: Female Height: 5\' 9"  Ref Phys: Hulan Saas, DO  ID#: [id number]   Technician:    History: This is a 47 year old female referred for evaluation of right paresthesias and pain.  NCV & EMG Findings: Extensive electrodiagnostic testing of the right upper extremity shows:  Right mixed palmar sensory responses are mildly prolonged.  Right median and ulnar sensory responses are within normal limits.   Right median and ulnar motor responses are within normal limits.   There is no evidence of active or chronic motor axonal loss changes affecting any of the tested muscles.  Motor unit configuration and recruitment pattern is within normal limits.  Impression: Right median neuropathy at or distal to the wrist, consistent with a clinical diagnosis of carpal tunnel syndrome.  Overall, these findings are very mild in degree electrically. There is no evidence of a cervical radiculopathy, ulnar neuropathy, or neurogenic thoracic outlet syndrome.   ___________________________ Narda Amber, DO    Nerve Conduction Studies   Stim Site NR Peak (ms) Norm Peak (ms) O-P Amp (V) Norm O-P Amp  Right Median Anti Sensory (2nd Digit)  32 C  Wrist    3.3 <3.4 21.0 >20  Right Ulnar Anti Sensory (5th Digit)  32 C  Wrist    2.4 <3.1 21.0 >12     Stim Site NR Onset (ms) Norm Onset (ms) O-P Amp (mV) Norm O-P Amp Site1 Site2 Delta-0 (ms) Dist (cm) Vel (m/s) Norm Vel (m/s)  Right Median Motor (Abd Poll Brev)  32 C  Wrist    3.3 <3.9 11.4 >6 Elbow Wrist 5.1 31.0 61 >50  Elbow    8.4  11.4         Right Ulnar Motor (Abd Dig Minimi)  32 C  Wrist    2.1 <3.1 10.7 >7 B Elbow Wrist 3.9 23.0 59 >50  B Elbow    6.0  10.5  A Elbow B Elbow 1.6 10.0 63 >50  A Elbow    7.6  10.3            Stim  Site NR Peak (ms) Norm Peak (ms) P-T Amp (V) Site1 Site2 Delta-P (ms) Norm Delta (ms)  Right Median/Ulnar Palm Comparison (Wrist - 8cm)  32 C  Median Palm    1.9 <2.2 58.9 Median Palm Ulnar Palm 0.5   Ulnar Palm    1.4 <2.2 35.1       Electromyography   Side Muscle Ins.Act Fibs Fasc Recrt Amp Dur Poly Activation Comment  Right 1stDorInt Nml Nml Nml Nml Nml Nml Nml Nml N/A  Right Abd Poll Brev Nml Nml Nml Nml Nml Nml Nml Nml N/A  Right PronatorTeres Nml Nml Nml Nml Nml Nml Nml Nml N/A  Right Biceps Nml Nml Nml Nml Nml Nml Nml Nml N/A  Right Triceps Nml Nml Nml Nml Nml Nml Nml Nml N/A  Right Deltoid Nml Nml Nml Nml Nml Nml Nml Nml N/A      Waveforms:

## 2022-07-09 ENCOUNTER — Ambulatory Visit
Admission: RE | Admit: 2022-07-09 | Discharge: 2022-07-09 | Disposition: A | Discharge status: Home / Self Care | Payer: BC Managed Care – PPO | Source: Ambulatory Visit | Attending: Family Medicine | Admitting: Family Medicine

## 2022-07-09 DIAGNOSIS — R079 Chest pain, unspecified: Secondary | ICD-10-CM

## 2022-07-09 MED ORDER — GADOPICLENOL 0.5 MMOL/ML IV SOLN
7.5000 mL | Freq: Once | INTRAVENOUS | Status: AC | PRN
  Administered 2022-07-09: 7.5 mL via INTRAVENOUS

## 2022-07-12 ENCOUNTER — Encounter: Payer: Self-pay | Admitting: Family Medicine

## 2022-08-08 ENCOUNTER — Encounter: Payer: Self-pay | Admitting: Hematology

## 2022-09-07 ENCOUNTER — Other Ambulatory Visit: Payer: Self-pay | Admitting: Family Medicine

## 2022-10-28 ENCOUNTER — Other Ambulatory Visit: Payer: Self-pay | Admitting: Hematology

## 2022-11-16 ENCOUNTER — Telehealth: Payer: Self-pay | Admitting: Hematology

## 2022-11-22 ENCOUNTER — Other Ambulatory Visit: Payer: Self-pay | Admitting: Hematology

## 2022-11-22 DIAGNOSIS — Z170 Estrogen receptor positive status [ER+]: Secondary | ICD-10-CM

## 2022-11-23 ENCOUNTER — Encounter: Payer: Self-pay | Admitting: Hematology

## 2022-11-23 ENCOUNTER — Telehealth: Payer: Self-pay | Admitting: Hematology

## 2022-12-03 ENCOUNTER — Other Ambulatory Visit: Payer: Self-pay

## 2022-12-03 ENCOUNTER — Ambulatory Visit: Payer: BC Managed Care – PPO | Admitting: Hematology

## 2022-12-03 ENCOUNTER — Ambulatory Visit: Payer: BC Managed Care – PPO

## 2022-12-03 ENCOUNTER — Other Ambulatory Visit: Payer: BC Managed Care – PPO

## 2022-12-03 DIAGNOSIS — C50811 Malignant neoplasm of overlapping sites of right female breast: Secondary | ICD-10-CM

## 2022-12-03 NOTE — Assessment & Plan Note (Signed)
-  Genetic testing from [facility] done 05/2018 was positive for PALB2 mutation. She has discovered a stronger maternal family history of prostate and breast cancer in her 22st, 2nd and 3rd degree relatives, Including her mother who had breast cancer also.  -We discussed the risk of future breast cancer from PALB2 mutation. Given her family history, she is s/p b/l mastectomy and reconstruction (10/21/18 and 11/24/19) and s/p BSO with hysterectomy (03/21/20).  -Her baseline 06/2020 colonoscopy/endoscopy was benign per patient.

## 2022-12-03 NOTE — Assessment & Plan Note (Signed)
cT2N1M0, ER+/PR+, HER2- in breast, HER2(+) in node, Grade II-III, ypT1cN2a -diagnosed in 05/2018, treated with Neoadjuvant TCHP, right mastectomy, adjuvant radiation through [facility], and maintenance Herceptin/Perjeta then 14 cycles of Kadcyla.   -s/p prophylactic left mastectomy and b/l reconstruction 11/24/19 by Dr. Valentino Saxon [facility]). -she completed 1 year Nerlynx in 09/2020 -she started Letrozole in 02/2019 (monthly Zoladex 12/2018-02/2020 until BSO 03/21/20). She developed worsening joint stiffness and switched to exemestane 01/16/21. Tolerating well overall with improved joint pain. Plan for a total of 10 years (until 02/2029) if she tolerates well  -she is clinically doing very well. There is no clinical concern for recurrence. -continue exemestane and surveillance; she is s/p b/l mastectomies. -f/u in 6 months with next Zometa

## 2022-12-03 NOTE — Assessment & Plan Note (Signed)
-  baseline DEXA from 07/21/19 shows osteopenia with T-score -1.3 at left femur neck. -repeat DEXA 08/31/21 showed some worsening with T-score of -1.9, which is getting worse. She endorses taking OTC calcium and vitamin D, and does weightbearing exercise regularly.  -she started Zometa in 11/2021. Plan for every 6 months for 2 years.

## 2022-12-04 ENCOUNTER — Encounter: Payer: Self-pay | Admitting: Hematology

## 2022-12-04 ENCOUNTER — Inpatient Hospital Stay: Payer: BC Managed Care – PPO

## 2022-12-04 ENCOUNTER — Inpatient Hospital Stay: Payer: BC Managed Care – PPO | Attending: Hematology | Admitting: Hematology

## 2022-12-04 VITALS — BP 110/67 | HR 63 | Temp 99.3°F | Resp 18 | Ht 69.0 in | Wt 150.3 lb

## 2022-12-04 DIAGNOSIS — Z1509 Genetic susceptibility to other malignant neoplasm: Secondary | ICD-10-CM

## 2022-12-04 DIAGNOSIS — Z08 Encounter for follow-up examination after completed treatment for malignant neoplasm: Secondary | ICD-10-CM

## 2022-12-04 DIAGNOSIS — M8580 Other specified disorders of bone density and structure, unspecified site: Secondary | ICD-10-CM | POA: Diagnosis not present

## 2022-12-04 DIAGNOSIS — Z1589 Genetic susceptibility to other disease: Secondary | ICD-10-CM

## 2022-12-04 DIAGNOSIS — Z1501 Genetic susceptibility to malignant neoplasm of breast: Secondary | ICD-10-CM

## 2022-12-04 DIAGNOSIS — C50811 Malignant neoplasm of overlapping sites of right female breast: Secondary | ICD-10-CM | POA: Diagnosis not present

## 2022-12-04 DIAGNOSIS — Z170 Estrogen receptor positive status [ER+]: Secondary | ICD-10-CM | POA: Insufficient documentation

## 2022-12-04 DIAGNOSIS — M85852 Other specified disorders of bone density and structure, left thigh: Secondary | ICD-10-CM | POA: Diagnosis not present

## 2022-12-04 DIAGNOSIS — Z79811 Long term (current) use of aromatase inhibitors: Secondary | ICD-10-CM | POA: Diagnosis not present

## 2022-12-04 LAB — CBC WITH DIFFERENTIAL (CANCER CENTER ONLY)
Abs Immature Granulocytes: 0 10*3/uL (ref 0.00–0.07)
Basophils Absolute: 0 10*3/uL (ref 0.0–0.1)
Basophils Relative: 1 %
Eosinophils Absolute: 0.1 10*3/uL (ref 0.0–0.5)
Eosinophils Relative: 5 %
HCT: 40.3 % (ref 36.0–46.0)
Hemoglobin: 13.2 g/dL (ref 12.0–15.0)
Immature Granulocytes: 0 %
Lymphocytes Relative: 31 %
Lymphs Abs: 0.8 10*3/uL (ref 0.7–4.0)
MCH: 31.2 pg (ref 26.0–34.0)
MCHC: 32.8 g/dL (ref 30.0–36.0)
MCV: 95.3 fL (ref 80.0–100.0)
Monocytes Absolute: 0.1 10*3/uL (ref 0.1–1.0)
Monocytes Relative: 5 %
Neutro Abs: 1.5 10*3/uL — ABNORMAL LOW (ref 1.7–7.7)
Neutrophils Relative %: 58 %
Platelet Count: 178 10*3/uL (ref 150–400)
RBC: 4.23 MIL/uL (ref 3.87–5.11)
RDW: 12.8 % (ref 11.5–15.5)
WBC Count: 2.6 10*3/uL — ABNORMAL LOW (ref 4.0–10.5)
nRBC: 0 % (ref 0.0–0.2)

## 2022-12-04 LAB — CMP (CANCER CENTER ONLY)
ALT: 16 U/L (ref 0–44)
AST: 11 U/L — ABNORMAL LOW (ref 15–41)
Albumin: 4.9 g/dL (ref 3.5–5.0)
Alkaline Phosphatase: 49 U/L (ref 38–126)
Anion gap: 7 (ref 5–15)
BUN: 16 mg/dL (ref 6–20)
CO2: 30 mmol/L (ref 22–32)
Calcium: 9.9 mg/dL (ref 8.9–10.3)
Chloride: 105 mmol/L (ref 98–111)
Creatinine: 0.91 mg/dL (ref 0.44–1.00)
GFR, Estimated: >60 mL/min (ref >60)
Glucose, Bld: 89 mg/dL (ref 70–99)
Potassium: 3.9 mmol/L (ref 3.5–5.1)
Sodium: 142 mmol/L (ref 135–145)
Total Bilirubin: 0.5 mg/dL (ref 0.3–1.2)
Total Protein: 7.9 g/dL (ref 6.5–8.1)

## 2022-12-04 MED ORDER — SODIUM CHLORIDE 0.9 % IV SOLN: Freq: Once | INTRAVENOUS | Status: AC

## 2022-12-04 MED ORDER — ZOLEDRONIC ACID 4 MG/100ML IV SOLN
4.0000 mg | Freq: Once | INTRAVENOUS | Status: AC
  Administered 2022-12-04: 4 mg via INTRAVENOUS
  Filled 2022-12-04: qty 100

## 2022-12-04 NOTE — Patient Instructions (Signed)

## 2022-12-04 NOTE — Addendum Note (Signed)
Addended by: Arcelia Jew on: 12/04/2022 08:56 AM   Modules accepted: Orders

## 2022-12-04 NOTE — Progress Notes (Signed)
[facility]   [phone] Fax:([phone]   Clinic Follow up Note   Patient Care Team: Shirlean Mylar, MD as PCP - General (Family Medicine) Griselda Miner, MD as Consulting Physician (General Surgery) Malachy Mood, MD as Consulting Physician (Hematology) Antony Blackbird, MD as Consulting Physician (Radiation Oncology) Noland Fordyce, MD as Consulting Physician (Obstetrics and Gynecology) Donnelly Angelica, RN as Oncology Nurse Navigator Pershing Proud, RN as Oncology Nurse Navigator Pollyann Samples, NP as Nurse Practitioner (Nurse Practitioner)  Date of Service:  12/04/2022  CHIEF COMPLAINT: f/u of multifocal right breast cancer     CURRENT THERAPY:   Antiestrogen therapy started 02/16/19 with Letrozole 2.5 mg, switched to Exemestane 01/2021     ASSESSMENT:  Kim [name] is a 47 y.o. female with   Monoallelic mutation of PALB2 gene -Genetic testing from [facility] done 05/2018 was positive for PALB2 mutation. She has discovered a stronger maternal family history of prostate and breast cancer in her 1st, 2nd and 3rd degree relatives, Including her mother who had breast cancer also.  -We discussed the risk of future breast cancer from PALB2 mutation. Given her family history, she is s/p b/l mastectomy and reconstruction (10/21/18 and 11/24/19) and s/p BSO with hysterectomy (03/21/20).  -Her baseline 06/2020 colonoscopy/endoscopy was benign per patient.  Osteopenia -baseline DEXA from 07/21/19 shows osteopenia with T-score -1.3 at left femur neck. -repeat DEXA 08/31/21 showed some worsening with T-score of -1.9, which is getting worse. She endorses taking OTC calcium and vitamin D, and does weightbearing exercise regularly.  -she started Zometa in 11/2021. Plan for every 6 months for 2 years.   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   -s/p prophylactic left mastectomy and b/l reconstruction 11/24/19 by Dr. Valentino Saxon [facility]). -she completed 1 year Nerlynx in 09/2020 -she started Letrozole in 02/2019 (monthly Zoladex 12/2018-02/2020 until BSO 03/21/20). She developed worsening joint stiffness and switched to exemestane 01/16/21. Tolerating well overall with improved joint pain. Plan for a total of 10 years (until 02/2029) if she tolerates well  -she is clinically doing very well. There is no clinical concern for recurrence. -continue exemestane and surveillance; she is s/p b/l mastectomies. -f/u in 6 months with next Zometa  COVID and dry cough  -She had COVID infection 6 to 8 weeks ago, still has dry cough and nasal congestion -I suggest her to use Flonase -If cough does not resolve in the next months, we will get a CT chest  PLAN: -lab reviewed -Continue Exemestane -proceed with the Zometa Infusion today -lab, follow-up, and the last dose Zometa in 6 months -She will call me if her cough does not resolve in a month   SUMMARY OF ONCOLOGIC HISTORY: Oncology History Overview Note  Cancer Staging Cancer of overlapping sites of right female breast [facility]) Staging form: Breast, AJCC 8th Edition - Clinical stage from 05/28/2018: Stage IB (cT2, cN1, cM0, G3, ER+, PR+, HER2+) - Signed by Malachy Mood, MD on 05/28/2018  Malignant neoplasm of lower-outer quadrant of right breast of female, estrogen receptor positive (HCC) Staging form: Breast, AJCC 8th Edition - Clinical stage from 05/28/2018: Stage IIB (cT2, cN1, cM0, G3, ER+, PR+, HER2-) - Unsigned     Cancer of overlapping sites of right female breast (HCC)  05/14/2018 Mammogram  Right breast mammogram 05/14/18  IMPRESSION  There is a 1.0 x 0.5 x 1.2 cm lobular hypoechoic mass right breast 7 o'clock position 3 cm from nipple, a 0.8 x 0.6 x 1.2 cm  lobular hypoechoic mass right breast 7 o'clock position 2 cm from the nipple, and a 0.9 x 0.5 x 0.8 cm lobular hypoechoic mass right breast 5 o'clock position 3 cm from nipple.   There is a 3.8 x 1.0 x 1.3 cm lobular hypoechoic mass right breast 5 o'clock position 2 cm from nipple.   Multiple thickened right axillary lymph nodes (approximately 15). Measuring up to 1.0 cm in thickness. Adenopathy corresponds with axillary palpable abnormality.  Indeterminate round and punctate calcifications upper-outer quadrant right breast.Indeterminate round and punctate calcifications upper-outer quadrant right breast.   05/21/2018 Initial Biopsy   Diagnosis 05/21/18  1. Breast, right, needle core biopsy, axilla, 5 o'clock, ribbon clip - INVASIVE MAMMARY CARCINOMA. - MAMMARY CARCINOMA IN SITU. 2. Breast, right, needle core biopsy, 7 o'clock, coil clip - INVASIVE MAMMARY CARCINOMA. - MAMMARY CARCINOMA IN SITU. 3. Lymph node, needle/core biopsy, right axillary LN, hydromark - METASTATIC MAMMARY CARCINOMA.    05/21/2018 Receptors her2   1. PROGNOSTIC INDICATORS Results: The tumor cells are EQUIVOCAL for Her2 (2+);  HER2 **NEGATIVE** by FISH   Estrogen Receptor: 80%, POSITIVE, MODERATE STAINING INTENSITY Progesterone Receptor: 5%, POSITIVE, MODERATE-WEAK STAINING INTENSITY Proliferation Marker Ki67: 10%  3. PROGNOSTIC INDICATORS Results: The tumor cells are POSITIVE for Her2 (3+). Estrogen Receptor: 90%, POSITIVE, STRONG STAINING INTENSITY Progesterone Receptor: 50%, POSITIVE, MODERATE STAINING INTENSITY Proliferation Marker Ki67: 5%   05/26/2018 Initial Diagnosis   Malignant neoplasm of lower-inner quadrant of right breast of female, estrogen receptor positive (HCC)   05/27/2018 Mammogram   Left breast mammogram 05/27/18  IMPRESSION: No mammographic evidence of LEFT breast malignancy.   05/28/2018 Cancer Staging   Staging form: Breast, AJCC 8th Edition - Clinical stage from 05/28/2018:  Stage IB (cT2, cN1, cM0, G3, ER+, PR+, HER2+) - Signed by Malachy Mood, MD on 05/28/2018   06/03/2018 Imaging   MR BREAST BILATERAL W WO CONTRAST INC CAD  IMPRESSION: 1. Large enhancing masses and non mass enhancement identified throughout the majority of the right breast. Additionally, there are smaller masses identified within the upper inner and lower inner right breast. Overall findings are concerning for extensive malignancy throughout all 4 quadrants of the right breast. 2. Extensive bulky lymphadenopathy identified within the right axilla. The matted appearance makes counting the number of abnormal lymph nodes difficult as they are immediately approximated to each other. 3. At least one abnormal appearing right internal mammary lymph node. 4. Nonenhancing lesion within the sternum with associated sternal marrow heterogeneity, raising the possibility of osseous metastatic disease within the sternum. 5. Two enhancing masses within the left breast as described above.     06/05/2018 PET scan   NM PET Image Initial (PI) Skull Base To Thigh  IMPRESSION: 1. Multiple borderline enlarged right axillary lymph nodes identified exhibiting mild to moderate increased uptake. Can not rule out nodal metastasis. Correlation with biopsy recommended. 2. Asymmetric increased uptake within the right breast corresponding to MRI abnormality. 3. Subcentimeter right retropectoral and right internal mammary lymph nodes are again identified and exhibit nonspecific, low level FDG uptake. 4. Noncalcified solid nodule within the lateral right lower lobe measures 9 mm without significant radiotracer uptake. 5. No hypermetabolic osseous lesions identified.     06/09/2018 - 11/03/2018 Chemotherapy   Neoadjuvant chemotherapy TCHP, 06/09/2018-09/22/18. Followed by Maintenance Herceptin/Perjeta q3weeks  starting 10/14/18 to continue 1 year treatment. Stopped Herceptin/Perjeta on 11/03/18 due to multiple Positive LN of  pathology.    06/10/2018 Pathology Results   Surgical pathology  Diagnosis 1. Breast, left, needle core biopsy, LOQ, barbell - MILD FIBROCYSTIC CHANGES. 2. Breast, left, needle core biopsy, UIQ cylinder - MILD FIBROCYSTIC CHANGES.    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 There were no deleterious mutations in AIP, ALK, APC, ATM, AXIN2,BAP1,  BARD1, BLM, BMPR1A, BRCA1, BRCA2, BRIP1, CASR, CDC73, CDH1, CDK4, CDKN1B, CDKN1C, CDKN2A (p14ARF), CDKN2A (p16INK4a), CEBPA, CHEK2, CTNNA1, DICER1, DIS3L2, EGFR (c.2369C>T, p.Thr790Met variant only), EPCAM (Deletion/duplication testing only), FH, FLCN, GATA2, GPC3, GREM1 (Promoter region deletion/duplication testing only), HOXB13 (c.251G>A, p.Gly84Glu), HRAS, KIT, MAX, MEN1, MET, MITF (c.952G>A, p.Glu318Lys variant only), MLH1, MSH2, MSH3, MSH6, MUTYH, NBN, NF1, NF2, NTHL1, PDGFRA, PHOX2B, PMS2, POLD1, POLE, POT1, PRKAR1A, PTCH1, PTEN, RAD50, RAD51C, RAD51D, RB1, RECQL4, RET, RUNX1, SDHAF2, SDHA (sequence changes only), SDHB, SDHC, SDHD, SMAD4, SMARCA4, SMARCB1, SMARCE1, STK11, SUFU, TERC, TERT, TMEM127, TP53, TSC1, TSC2, VHL, WRN and WT1.  .   09/24/2018 Breast MRI   Breast MRI from [facility] on 09/24/18 Right breast: Signal void inferior right breast, posterior depth from biopsy marker clip. Interval decreased size of the right breast, and now symmetric to the left, compared to prior MRI. Resolution of abnormal nonmass enhancement without evidence of residual enhancing mass. No evidence of axillary or internal mammary lymphadenopathy is seen, markedly improved from prior. There is no abnormal skin, nipple, or pectoralis muscle enhancement.  Left breast: There are no suspicious enhancing masses or areas of non-mass enhancement. No evidence of axillary or internal mammary lymphadenopathy is seen.  There is no abnormal skin, nipple, or pectoralis muscle enhancement.   10/09/2018 Imaging   CT chest W Contrast 10/09/18  IMPRESSION: 1. Stable 10 mm right lower lobe pulmonary nodule since 06/05/2018. Nodule is not substantially hypermetabolic on the PET-CT. Continued attention on follow-up recommended. 2. No findings to suggest metastatic disease in the thorax.   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 surrounding tissue - Biopsy clips identified - Margin status (see extended anterior margin below)  Invasive carcinoma: Positive anterior-inferior margin  Ductal carcinoma in situ: Positive anterior-inferior margin - Ancillary studies previously reported on [id number](5:00, ribbon clip) Estrogen receptor: Positive (80%) Progesterone receptor: Positive (10%) HER2 IHC: Equivocal (2+) HER2 FISH: Negative HER2/CEP17 ratio: 1.35 Mean HER2 copy number: 1.55 - Ancillary studies previously reported on [id number] (right axillary lymph node) Estrogen receptor: Positive (90%) Progesterone receptor: Positive (20%) HER2 IHC: Positive (3+) - Residual Cancer Burden  Tumor bed size: 41 mm x 20 mm Overall tumor cellularity: 10% Percentage in situ: 60% Number of involved lymph nodes: 7 Diameter of largest metastasis: 6 mm Residual Cancer Burden Score: 3.108 Residual Cancer Burden Class: RCB-II - Other findings: Atypical lobular hyperplasia and columnar cell change with microcalcifications; fibroadenomas with microcalcifications  B: Right axilla, targeted lymph node excision - Six of seven lymph nodes positive for carcinoma (6/7) - Size of largest tumor deposit: 6 mm -  Biopsy site identified - Treatment effect: Present - Extracapsular extension: Absent  C: Right axilla, lymph node dissection - One of seven lymph nodes positive for carcinoma, macrometastasis, 6 mm (1/7) - Treatment effect: Absent - Extracapsular extension: Absent  D: Right breast, nipple core biopsy - Negative for carcinoma  E: Breast, right, anterior margin, excision - Microscopic foci of ductal carcinoma in situ - Margin: Negative, <1 mm    11/05/2018 Cancer Staging   Staging form: Breast, AJCC 8th Edition - Pathologic stage from 11/05/2018: No Stage Recommended (ypT1c, pN2a, cM0, G2, ER+, PR+, HER2-) - Signed by Malachy Mood, MD on 11/23/2018   11/24/2018 - 08/24/2019 Chemotherapy   Due to positive LN on surgical pathology her maintance Herceptin/Perjeta was changed to Kadcyla q3weeks for about 14 cycles on 11/24/18. Completed on 08/24/19   12/24/2018 - 02/03/2019 Radiation Therapy   adjuvant radiation at [facility] on 12/24/18 and completed on 02/03/19.    01/23/2019 Imaging   CT Chest wo  IMPRESSION: 1. Stable right lower lobe pulmonary nodule. 2. No signs of new or suspicious finding since study of 10/09/2018, now status post right mastectomy, axillary dissection and breast reconstruction.   02/16/2019 -  Anti-estrogen oral therapy   -Monthly Zoladex injection started 12/19/18-02/22/20. Stopped after BSO surgery in 03/2020 -Letrozole 2.5 mg started 02/16/19    04/20/2019 Survivorship   SCP delivered by Santiago Glad, NP    09/22/2019 - 09/2020 Chemotherapy   Nerlynx starting at 4 tablets (160 mg total) by mouth daily for 7 days, THEN 5 tablets (200 mg total) daily for 7 days, THEN 6 tablets (240 mg total) daily for 16 days, starting on 09/22/19. Stop mid June.    11/24/2019 Surgery   left prophylactic mastectomy and bilateral reconstruction at [facility] by Dr Sherilyn Cooter 11/24/19.   03/21/2020 Surgery   XI ROBOTIC ASSISTED TOTAL HYSTERECTOMY WITH BILATERAL SALPINGO  OOPHORECTOMY/CYSTOSCOPY, Pelvic Washing by Dr Prudencio Pair  A. PERITONEAL, WASHING:    FINAL MICROSCOPIC DIAGNOSIS:  - Reactive mesothelial cells present    FINAL MICROSCOPIC DIAGNOSIS:   A. UTERUS, CERVIX, BILATERAL TUBE AND OVARIES:  - Uterus:       Endometrium: Inactive endometrium. No hyperplasia or malignancy.       Myometrium: Unremarkable. No malignancy.       Serosa: Unremarkable. No malignancy.  - Cervix: Benign squamous and endocervical mucosa. No dysplasia or  malignancy.  - Bilateral ovaries: Unremarkable. No malignancy.  - Bilateral fallopian tubes: Unremarkable. No malignancy.     05/01/2021 Imaging   EXAM: CT CHEST WITHOUT CONTRAST  IMPRESSION: 1. Stable 9 x 7 mm peripheral right lower lobe pulmonary nodule. This is unchanged comparing back to PET-CT 06/05/2018 when showed no substantial hypermetabolism. Given stability over greater than 2 years, this is compatible with benign etiology. 2. Stable appearance of hepatic cysts.      INTERVAL HISTORY:  Kim [name] is here for a follow up of  multifocal right breast cancer. She was last seen by me on 06/04/2022. She presents to the clinic alone. Pt reports of dealing with personal family matter, but from a cancer stand point she is concern about her WBC. Pt state that she had covid 8 weeks ago, but still having some sinus issues and cough. Pt is no longer taking Sertraline she has switched to Trazodone. Pt has no issues with the Zometa infusion.     All other systems were reviewed with the patient and are negative.  MEDICAL HISTORY:  Past Medical History:  Diagnosis Date   Acid reflux    Acute bronchitis    Anemia    iron infusions   Anxiety    Asthma    rare   Cancer (HCC)    Depression    Family history of brain cancer    Family history of breast cancer    Family history of prostate cancer    Migraine     SURGICAL HISTORY: Past Surgical History:  Procedure Laterality Date   BREAST BIOPSY  Right 05/23/2018   x3, malignant   DILATION AND CURETTAGE OF UTERUS     2006 x1, 2009 x2   LAPAROTOMY  2008   PORTACATH PLACEMENT Left 06/06/2018   Procedure: INSERTION PORT-A-CATH POSSIBLE ULTRASOUND;  Surgeon: Griselda Miner, MD;  Location:  SURGERY CENTER;  Service: General;  Laterality: Left;   ROBOTIC ASSISTED TOTAL HYSTERECTOMY WITH BILATERAL SALPINGO OOPHERECTOMY Bilateral 03/21/2020   Procedure: XI ROBOTIC ASSISTED TOTAL HYSTERECTOMY WITH BILATERAL SALPINGO OOPHORECTOMY/CYSTOSCOPY,Pelvic Washing;  Surgeon: Noland Fordyce, MD;  Location[facility];  Service: Gynecology;  Laterality: Bilateral;  [name] to RNFA confirmed on 03/03/20 CS    I have reviewed the social history and family history with the patient and they are unchanged from previous note.  ALLERGIES:  has No Known Allergies.  MEDICATIONS:  Current Outpatient Medications  Medication Sig Dispense Refill   CALCIUM-MAGNESIUM-VITAMIN D PO Take 1 tablet by mouth daily.     exemestane (AROMASIN) 25 MG tablet TAKE 1 TABLET BY MOUTH EVERY DAY AFTER BREAKFAST 90 tablet 1   gabapentin (NEURONTIN) 100 MG capsule TAKE 2 CAPSULES BY MOUTH AT BEDTIME. 180 capsule 0   melatonin 5 MG TABS Take 5 mg by mouth at bedtime.      senna (SENOKOT) 8.6 MG tablet Take 1 tablet by mouth daily.     No current facility-administered medications for this visit.    PHYSICAL EXAMINATION: ECOG PERFORMANCE STATUS: 0 - Asymptomatic  Vitals:   12/04/22 0806  BP: 110/67  Pulse: 63  Resp: 18  Temp: 99.3 F (37.4 C)  SpO2: 100%   Wt Readings from Last 3 Encounters:  12/04/22 150 lb 4.8 oz (68.2 kg)  06/04/22 155 lb 3.2 oz (70.4 kg)  05/31/22 153 lb (69.4 kg)     GENERAL:alert, no distress and comfortable SKIN: skin color normal, no rashes or significant lesions EYES: normal, Conjunctiva are pink and non-injected, sclera clear  NEURO: alert & oriented x 3 with fluent speech NECK: (-)supple, thyroid normal size,  non-tender, without nodularity LYMPH:(-)  no palpable lymphadenopathy in the cervical, axillary  LUNGS:(-) clear to auscultation and percussion with normal breathing effort ABDOMEN:(-)abdomen soft, (-)non-tender and normal bowel sounds Breast: RT breast no palpable mass breast exam benign. LT breast no palpable mass breast exam benign. LABORATORY DATA:  I have reviewed the data as listed    Latest Ref Rng & Units 12/04/2022    7:46 AM 06/04/2022    8:09 AM 11/30/2021    8:21 AM  CBC  WBC 4.0 - 10.5 K/uL 2.6  3.1  2.5   Hemoglobin 12.0 - 15.0 g/dL 46.9  62.9  52.8   Hematocrit 36.0 - 46.0 % 40.3  40.8  36.7   Platelets 150 - 400 K/uL 178  171  158         Latest Ref Rng & Units 12/04/2022    7:46 AM 06/04/2022    8:09 AM 11/30/2021    8:21 AM  CMP  Glucose 70 - 99  mg/dL 89  64  99   BUN 6 - 20 mg/dL 16  15  16    Creatinine 0.44 - 1.00 mg/dL 4.09  8.11  9.14   Sodium 135 - 145 mmol/L 142  140  139   Potassium 3.5 - 5.1 mmol/L 3.9  4.0  4.3   Chloride 98 - 111 mmol/L 105  104  104   CO2 22 - 32 mmol/L 30  31  30    Calcium 8.9 - 10.3 mg/dL 9.9  9.7  78.2   Total Protein 6.5 - 8.1 g/dL 7.9  7.5  7.2   Total Bilirubin 0.3 - 1.2 mg/dL 0.5  0.5  0.5   Alkaline Phos 38 - 126 U/L 49  49  58   AST 15 - 41 U/L 11  13  12    ALT 0 - 44 U/L 16  22  15        RADIOGRAPHIC STUDIES: I have personally reviewed the radiological images as listed and agreed with the findings in the report. No results found.    Orders Placed This Encounter  Procedures   Cancer antigen 27.29    Standing Status:   Standing    Number of Occurrences:   5    Standing Expiration Date:   12/03/2023   All questions were answered. The patient knows to call the clinic with any problems, questions or concerns. No barriers to learning was detected. The total time spent in the appointment was 30 minutes.     Malachy Mood, MD 12/04/2022   Carolin Coy, CMA, am acting as scribe for Malachy Mood, MD.   I have reviewed  the above documentation for accuracy and completeness, and I agree with the above.   ,

## 2022-12-05 ENCOUNTER — Telehealth: Payer: Self-pay | Admitting: Hematology

## 2022-12-05 LAB — CANCER ANTIGEN 27.29: CA 27.29: 20.8 U/mL (ref 0.0–38.6)

## 2023-03-06 ENCOUNTER — Encounter: Payer: Self-pay | Admitting: Hematology

## 2023-03-06 NOTE — Telephone Encounter (Signed)
Telephone call  

## 2023-04-15 ENCOUNTER — Encounter: Payer: Self-pay | Admitting: Hematology

## 2023-05-01 ENCOUNTER — Other Ambulatory Visit: Payer: Self-pay | Admitting: Hematology

## 2023-05-20 NOTE — Assessment & Plan Note (Signed)
-  Genetic testing from [facility] done 05/2018 was positive for PALB2 mutation. She has discovered a stronger maternal family history of prostate and breast cancer in her 22st, 2nd and 3rd degree relatives, Including her mother who had breast cancer also.  -We discussed the risk of future breast cancer from PALB2 mutation. Given her family history, she is s/p b/l mastectomy and reconstruction (10/21/18 and 11/24/19) and s/p BSO with hysterectomy (03/21/20).  -Her baseline 06/2020 colonoscopy/endoscopy was benign per patient.

## 2023-05-20 NOTE — Assessment & Plan Note (Signed)
cT2N1M0, ER+/PR+, HER2- in breast, HER2(+) in node, Grade II-III, ypT1cN2a -diagnosed in 05/2018, treated with Neoadjuvant TCHP, right mastectomy, adjuvant radiation through [facility], and maintenance Herceptin/Perjeta then 14 cycles of Kadcyla.   -s/p prophylactic left mastectomy and b/l reconstruction 11/24/19 by Dr. Valentino Saxon [facility]). -she completed 1 year Nerlynx in 09/2020 -she started Letrozole in 02/2019 (monthly Zoladex 12/2018-02/2020 until BSO 03/21/20). She developed worsening joint stiffness and switched to exemestane 01/16/21. Tolerating well overall with improved joint pain. Plan for a total of 10 years (until 02/2029) if she tolerates well  -she is clinically doing very well. There is no clinical concern for recurrence. -continue exemestane and surveillance; she is s/p b/l mastectomies. -f/u in 6 months with next Zometa

## 2023-05-20 NOTE — Assessment & Plan Note (Signed)
-  baseline DEXA from 07/21/19 shows osteopenia with T-score -1.3 at left femur neck. -repeat DEXA 08/31/21 showed some worsening with T-score of -1.9, which is getting worse. She endorses taking OTC calcium and vitamin D, and does weightbearing exercise regularly.  -she started Zometa in 11/2021. Plan for every 6 months for 2 years.

## 2023-05-21 ENCOUNTER — Inpatient Hospital Stay: Payer: Self-pay

## 2023-05-21 ENCOUNTER — Other Ambulatory Visit: Payer: Self-pay

## 2023-05-21 ENCOUNTER — Inpatient Hospital Stay: Payer: 59

## 2023-05-21 ENCOUNTER — Inpatient Hospital Stay: Payer: Self-pay | Attending: Hematology | Admitting: Hematology

## 2023-05-21 ENCOUNTER — Encounter: Payer: Self-pay | Admitting: Hematology

## 2023-05-21 VITALS — BP 113/75 | HR 69 | Temp 97.9°F | Resp 17 | Ht 69.0 in | Wt 146.1 lb

## 2023-05-21 DIAGNOSIS — Z1501 Genetic susceptibility to malignant neoplasm of breast: Secondary | ICD-10-CM

## 2023-05-21 DIAGNOSIS — M810 Age-related osteoporosis without current pathological fracture: Secondary | ICD-10-CM | POA: Diagnosis not present

## 2023-05-21 DIAGNOSIS — Z1509 Genetic susceptibility to other malignant neoplasm: Secondary | ICD-10-CM

## 2023-05-21 DIAGNOSIS — Z1589 Genetic susceptibility to other disease: Secondary | ICD-10-CM

## 2023-05-21 DIAGNOSIS — C50811 Malignant neoplasm of overlapping sites of right female breast: Secondary | ICD-10-CM

## 2023-05-21 DIAGNOSIS — Z79811 Long term (current) use of aromatase inhibitors: Secondary | ICD-10-CM | POA: Diagnosis not present

## 2023-05-21 DIAGNOSIS — Z170 Estrogen receptor positive status [ER+]: Secondary | ICD-10-CM | POA: Diagnosis not present

## 2023-05-21 DIAGNOSIS — N951 Menopausal and female climacteric states: Secondary | ICD-10-CM | POA: Diagnosis not present

## 2023-05-21 DIAGNOSIS — M8580 Other specified disorders of bone density and structure, unspecified site: Secondary | ICD-10-CM | POA: Diagnosis not present

## 2023-05-21 LAB — CBC WITH DIFFERENTIAL (CANCER CENTER ONLY)
Abs Immature Granulocytes: 0.01 10*3/uL (ref 0.00–0.07)
Basophils Absolute: 0 10*3/uL (ref 0.0–0.1)
Basophils Relative: 1 %
Eosinophils Absolute: 0.2 10*3/uL (ref 0.0–0.5)
Eosinophils Relative: 6 %
HCT: 39.5 % (ref 36.0–46.0)
Hemoglobin: 12.9 g/dL (ref 12.0–15.0)
Immature Granulocytes: 0 %
Lymphocytes Relative: 25 %
Lymphs Abs: 0.8 10*3/uL (ref 0.7–4.0)
MCH: 30.8 pg (ref 26.0–34.0)
MCHC: 32.7 g/dL (ref 30.0–36.0)
MCV: 94.3 fL (ref 80.0–100.0)
Monocytes Absolute: 0.2 10*3/uL (ref 0.1–1.0)
Monocytes Relative: 7 %
Neutro Abs: 2.1 10*3/uL (ref 1.7–7.7)
Neutrophils Relative %: 61 %
Platelet Count: 214 10*3/uL (ref 150–400)
RBC: 4.19 MIL/uL (ref 3.87–5.11)
RDW: 13.5 % (ref 11.5–15.5)
WBC Count: 3.4 10*3/uL — ABNORMAL LOW (ref 4.0–10.5)
nRBC: 0 % (ref 0.0–0.2)

## 2023-05-21 LAB — CMP (CANCER CENTER ONLY)
ALT: 13 U/L (ref 0–44)
AST: 12 U/L — ABNORMAL LOW (ref 15–41)
Albumin: 4.6 g/dL (ref 3.5–5.0)
Alkaline Phosphatase: 45 U/L (ref 38–126)
Anion gap: 7 (ref 5–15)
BUN: 22 mg/dL — ABNORMAL HIGH (ref 6–20)
CO2: 31 mmol/L (ref 22–32)
Calcium: 9.9 mg/dL (ref 8.9–10.3)
Chloride: 102 mmol/L (ref 98–111)
Creatinine: 0.86 mg/dL (ref 0.44–1.00)
GFR, Estimated: >60 mL/min (ref >60)
Glucose, Bld: 64 mg/dL — ABNORMAL LOW (ref 70–99)
Potassium: 3.9 mmol/L (ref 3.5–5.1)
Sodium: 140 mmol/L (ref 135–145)
Total Bilirubin: 0.5 mg/dL (ref 0.0–1.2)
Total Protein: 7.3 g/dL (ref 6.5–8.1)

## 2023-05-21 MED ORDER — ZOLEDRONIC ACID 4 MG/100ML IV SOLN
4.0000 mg | Freq: Once | INTRAVENOUS | Status: AC
  Administered 2023-05-21: 4 mg via INTRAVENOUS

## 2023-05-21 MED ORDER — SODIUM CHLORIDE 0.9 % IV SOLN: Freq: Once | INTRAVENOUS | Status: AC

## 2023-05-21 NOTE — Addendum Note (Signed)
Addended by: Malachy Mood on: 05/21/2023 09:28 AM   Modules accepted: Orders

## 2023-05-21 NOTE — Progress Notes (Signed)
 [facility]   Telephone:([phone]) [phone] Fax:([phone]) [phone]   Clinic Follow up Note   Patient Care Team: Douglass Ivanoff, MD as PCP - General (Family Medicine) Curvin Deward MOULD, MD as Consulting Physician (General Surgery) Lanny Callander, MD as Consulting Physician (Hematology) Shannon Agent, MD as Consulting Physician (Radiation Oncology) Kandyce Sor, MD as Consulting Physician (Obstetrics and Gynecology) Tyree Nanetta SAILOR, RN as Oncology Nurse Navigator Glean Stephane BROCKS, RN as Oncology Nurse Navigator Burton, Lacie K, NP as Nurse Practitioner (Nurse Practitioner)  Date of Service:  05/21/2023  CHIEF COMPLAINT: f/u of right breast cancer  CURRENT THERAPY:  Adjuvant  exemestane   Oncology History   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   -s/p prophylactic left mastectomy and b/l reconstruction 11/24/19 by Dr. Linnie [facility]). -she completed 1 year Nerlynx  in 09/2020 -she started Letrozole  in 02/2019 (monthly Zoladex  12/2018-02/2020 until BSO 03/21/20). She developed worsening joint stiffness and switched to exemestane  01/16/21. Tolerating well overall with improved joint pain. Plan for a total of 10 years (until 02/2029) if she tolerates well  -she is clinically doing very well. There is no clinical concern for recurrence. -continue exemestane  and surveillance; she is s/p b/l mastectomies. -f/u in 6 months with next Zometa   Monoallelic mutation of PALB2 gene -Genetic testing from [facility] done 05/2018 was positive for PALB2 mutation. She has discovered a stronger maternal family history of prostate and breast cancer in her 1st, 2nd and 3rd degree relatives, Including her mother who had breast cancer also.  -We discussed the risk of future  breast cancer from PALB2 mutation. Given her family history, she is s/p b/l mastectomy and reconstruction (10/21/18 and 11/24/19) and s/p BSO with hysterectomy (03/21/20).  -Her baseline 06/2020 colonoscopy/endoscopy was benign per patient.  Osteopenia -baseline DEXA from 07/21/19 shows osteopenia with T-score -1.3 at left femur neck. -repeat DEXA 08/31/21 showed some worsening with T-score of -1.9, which is getting worse. She endorses taking OTC calcium  and vitamin D , and does weightbearing exercise regularly.  -she started Zometa  in 11/2021. Plan for every 6 months for 2 years.    Assessment and Plan    Breast Cancer More than five years post-diagnosis with completed initial treatments (surgery, radiation, targeted therapy). Currently on aromatase inhibitor therapy. Discussed potential for distant recurrence and the use of Signatera blood test to detect circulating tumor DNA. Explained test methodology and follow-up imaging if positive. Patient interested in early detection despite potential anxiety from false positives. Discussed treatment options if recurrence detected (oral medications, surgery, radiation). - Order Signatera blood test for circulating tumor DNA - Schedule follow-up in nine months - Continue aromatase inhibitor therapy  Osteoporosis Receiving Zometa  infusions for bone health. No issues with previous infusions. Good dental health with routine cleanings. - Administer fourth Zometa  infusion today - No further Zometa  infusions planned  Postmenopausal Symptoms Reports vaginal dryness managed with over-the-counter products. No new symptoms. Discussed potential for atrophy and importance of regular gynecological follow-ups. - Continue using over-the-counter products for vaginal dryness - Follow up with gynecologist for further management  General Health Maintenance Cholesterol and hemoglobin A1c levels increased, likely due to menopause and current medication. Maintains healthy diet  and regular exercise. - Continue healthy diet and regular exercise - Discuss potential need for cholesterol medication with primary care physician  Plan -  Lab reviewed, adequate for treatment, she has no active dental issues, will proceed to last dose of Zometa  today -Continue exemestane  - Follow up with gynecologist in six months - Follow up with primary care physician in twelve months - Follow up with me in nine months. Then yearly after next visit  -Will order Signatera to be done in the next few weeks, then with each visit        SUMMARY OF ONCOLOGIC HISTORY: Oncology History Overview Note  Cancer Staging Cancer of overlapping sites of right female breast [facility]) Staging form: Breast, AJCC 8th Edition - Clinical stage from 05/28/2018: Stage IB (cT2, cN1, cM0, G3, ER+, PR+, HER2+) - Signed by Lanny Callander, MD on 05/28/2018  Malignant neoplasm of lower-outer quadrant of right breast of female, estrogen receptor positive (HCC) Staging form: Breast, AJCC 8th Edition - Clinical stage from 05/28/2018: Stage IIB (cT2, cN1, cM0, G3, ER+, PR+, HER2-) - Unsigned     Cancer of overlapping sites of right female breast (HCC)  05/14/2018 Mammogram   Right breast mammogram 05/14/18  IMPRESSION  There is a 1.0 x 0.5 x 1.2 cm lobular hypoechoic mass right breast 7 o'clock position 3 cm from nipple, a 0.8 x 0.6 x 1.2 cm lobular hypoechoic mass right breast 7 o'clock position 2 cm from the nipple, and a 0.9 x 0.5 x 0.8 cm lobular hypoechoic mass right breast 5 o'clock position 3 cm from nipple.   There is a 3.8 x 1.0 x 1.3 cm lobular hypoechoic mass right breast 5 o'clock position 2 cm from nipple.   Multiple thickened right axillary lymph nodes (approximately 15). Measuring up to 1.0 cm in thickness. Adenopathy corresponds with axillary palpable abnormality.  Indeterminate round and punctate calcifications upper-outer quadrant right breast.Indeterminate round and punctate calcifications  upper-outer quadrant right breast.   05/21/2018 Initial Biopsy   Diagnosis 05/21/18  1. Breast, right, needle core biopsy, axilla, 5 o'clock, ribbon clip - INVASIVE MAMMARY CARCINOMA. - MAMMARY CARCINOMA IN SITU. 2. Breast, right, needle core biopsy, 7 o'clock, coil clip - INVASIVE MAMMARY CARCINOMA. - MAMMARY CARCINOMA IN SITU. 3. Lymph node, needle/core biopsy, right axillary LN, hydromark - METASTATIC MAMMARY CARCINOMA.    05/21/2018 Receptors her2   1. PROGNOSTIC INDICATORS Results: The tumor cells are EQUIVOCAL for Her2 (2+);  HER2 **NEGATIVE** by FISH   Estrogen Receptor: 80%, POSITIVE, MODERATE STAINING INTENSITY Progesterone Receptor: 5%, POSITIVE, MODERATE-WEAK STAINING INTENSITY Proliferation Marker Ki67: 10%  3. PROGNOSTIC INDICATORS Results: The tumor cells are POSITIVE for Her2 (3+). Estrogen Receptor: 90%, POSITIVE, STRONG STAINING INTENSITY Progesterone Receptor: 50%, POSITIVE, MODERATE STAINING INTENSITY Proliferation Marker Ki67: 5%   05/26/2018 Initial Diagnosis   Malignant neoplasm of lower-inner quadrant of right breast of female, estrogen receptor positive (HCC)   05/27/2018 Mammogram   Left breast mammogram 05/27/18  IMPRESSION: No mammographic evidence of LEFT breast malignancy.   05/28/2018 Cancer Staging   Staging form: Breast, AJCC 8th Edition - Clinical stage from 05/28/2018: Stage IB (cT2, cN1, cM0, G3, ER+, PR+, HER2+) - Signed by Lanny Callander, MD on 05/28/2018   06/03/2018 Imaging   MR BREAST BILATERAL W WO CONTRAST INC CAD  IMPRESSION: 1. Large enhancing masses and non mass enhancement identified throughout the majority of the right breast. Additionally, there are smaller masses identified within the upper inner and lower inner right breast. Overall findings are concerning for extensive malignancy throughout all 4 quadrants of the right breast. 2. Extensive bulky lymphadenopathy identified within the right axilla. The matted appearance makes  counting the number of abnormal lymph nodes difficult as they are immediately approximated to each other. 3. At least one abnormal appearing right internal mammary lymph node. 4. Nonenhancing lesion within the sternum with associated sternal marrow heterogeneity, raising the possibility of osseous metastatic disease within the sternum. 5. Two enhancing masses within the left breast as described above.     06/05/2018 PET scan   NM PET Image Initial (PI) Skull Base To Thigh  IMPRESSION: 1. Multiple borderline enlarged right axillary lymph nodes identified exhibiting mild to moderate increased uptake. Can not rule out nodal metastasis. Correlation with biopsy recommended. 2. Asymmetric increased uptake within the right breast corresponding to MRI abnormality. 3. Subcentimeter right retropectoral and right internal mammary lymph nodes are again identified and exhibit nonspecific, low level FDG uptake. 4. Noncalcified solid nodule within the lateral right lower lobe measures 9 mm without significant radiotracer uptake. 5. No hypermetabolic osseous lesions identified.     06/09/2018 - 11/03/2018 Chemotherapy   Neoadjuvant chemotherapy TCHP, 06/09/2018-09/22/18. Followed by Maintenance Herceptin /Perjeta  q3weeks starting 10/14/18 to continue 1 year treatment. Stopped Herceptin /Perjeta  on 11/03/18 due to multiple Positive LN of pathology.    06/10/2018 Pathology Results   Surgical pathology  Diagnosis 1. Breast, left, needle core biopsy, LOQ, barbell - MILD FIBROCYSTIC CHANGES. 2. Breast, left, needle core biopsy, UIQ cylinder - MILD FIBROCYSTIC CHANGES.    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 There were no deleterious mutations in AIP, ALK, APC, ATM, AXIN2,BAP1,  BARD1, BLM, BMPR1A, BRCA1, BRCA2, BRIP1, CASR, CDC73,  CDH1, CDK4, CDKN1B, CDKN1C, CDKN2A (p14ARF), CDKN2A (p16INK4a), CEBPA, CHEK2, CTNNA1, DICER1, DIS3L2, EGFR (c.2369C>T, p.Thr790Met variant only), EPCAM (Deletion/duplication testing only), FH, FLCN, GATA2, GPC3, GREM1 (Promoter region deletion/duplication testing only), HOXB13 (c.251G>A, p.Gly84Glu), HRAS, KIT, MAX, MEN1, MET, MITF (c.952G>A, p.Glu318Lys variant only), MLH1, MSH2, MSH3, MSH6, MUTYH, NBN, NF1, NF2, NTHL1, PDGFRA, PHOX2B, PMS2, POLD1, POLE, POT1, PRKAR1A, PTCH1, PTEN, RAD50, RAD51C, RAD51D, RB1, RECQL4, RET, RUNX1, SDHAF2, SDHA (sequence changes only), SDHB, SDHC, SDHD, SMAD4, SMARCA4, SMARCB1, SMARCE1, STK11, SUFU, TERC, TERT, TMEM127, TP53, TSC1, TSC2, VHL, WRN and WT1.  .   09/24/2018 Breast MRI   Breast MRI from [facility] on 09/24/18 Right breast: Signal void inferior right breast, posterior depth from biopsy marker clip. Interval decreased size of the right breast, and now symmetric to the left, compared to prior MRI. Resolution of abnormal nonmass enhancement without evidence of residual enhancing mass. No evidence of axillary or internal mammary lymphadenopathy is seen, markedly improved from prior. There is no abnormal skin, nipple, or pectoralis muscle enhancement.  Left breast: There are no suspicious enhancing masses or areas of non-mass enhancement. No evidence of axillary or internal mammary lymphadenopathy is seen. There is no abnormal skin, nipple, or pectoralis muscle enhancement.   10/09/2018 Imaging   CT chest W Contrast 10/09/18  IMPRESSION: 1. Stable 10 mm right lower lobe pulmonary nodule since 06/05/2018. Nodule is not substantially hypermetabolic on the PET-CT. Continued attention on follow-up recommended. 2. No findings to suggest metastatic disease in the thorax.   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 surrounding tissue - Biopsy clips identified - Margin status (see extended anterior margin below)  Invasive carcinoma: Positive anterior-inferior margin  Ductal carcinoma in situ: Positive anterior-inferior margin - Ancillary studies previously reported on [id number](5:00, ribbon clip) Estrogen receptor: Positive (80%) Progesterone receptor: Positive (10%) HER2 IHC: Equivocal (2+) HER2 FISH: Negative HER2/CEP17 ratio: 1.35 Mean HER2 copy number: 1.55 - Ancillary studies previously reported on [id number] (right axillary lymph node) Estrogen receptor: Positive (90%) Progesterone receptor: Positive (20%) HER2 IHC: Positive (3+) - Residual Cancer Burden  Tumor bed size: 41 mm x 20 mm Overall tumor cellularity: 10% Percentage in situ: 60% Number of involved lymph nodes: 7 Diameter of largest metastasis: 6 mm Residual Cancer Burden Score: 3.108 Residual Cancer Burden Class: RCB-II - Other findings: Atypical lobular hyperplasia and columnar cell change with microcalcifications; fibroadenomas with microcalcifications  B: Right axilla, targeted lymph node excision - Six of seven lymph nodes positive for carcinoma (6/7) - Size of largest tumor deposit: 6 mm - Biopsy site identified - Treatment effect: Present - Extracapsular extension: Absent  C: Right axilla, lymph node dissection - One of seven lymph nodes positive for carcinoma, macrometastasis, 6 mm (1/7) - Treatment effect: Absent - Extracapsular extension: Absent  D: Right breast, nipple core biopsy - Negative for carcinoma  E: Breast, right, anterior margin, excision - Microscopic foci of ductal carcinoma in situ - Margin: Negative, <1 mm    11/05/2018 Cancer Staging   Staging form: Breast, AJCC 8th Edition - Pathologic stage from 11/05/2018: No Stage  Recommended (ypT1c, pN2a, cM0, G2, ER+, PR+, HER2-) - Signed by Lanny Callander, MD on 11/23/2018   11/24/2018 - 08/24/2019 Chemotherapy   Due to positive LN on surgical pathology her maintance Herceptin /Perjeta  was changed to Kadcyla  q3weeks for about 14 cycles on 11/24/18. Completed on 08/24/19   12/24/2018 - 02/03/2019 Radiation Therapy   adjuvant radiation at [facility] through [facility] on 12/24/18 and completed on 02/03/19.    01/23/2019 Imaging   CT Chest wo  IMPRESSION: 1. Stable right lower lobe pulmonary nodule. 2. No signs of new or suspicious finding since study of 10/09/2018, now status post right mastectomy, axillary dissection and breast reconstruction.   02/16/2019 -  Anti-estrogen oral therapy   -Monthly Zoladex  injection started 12/19/18-02/22/20. Stopped after BSO surgery in 03/2020 -Letrozole  2.5 mg started 02/16/19    04/20/2019 Survivorship   SCP delivered by Lacie Burton, NP    09/22/2019 - 09/2020 Chemotherapy   Nerlynx  starting at 4 tablets (160 mg total) by mouth daily for 7 days, THEN 5 tablets (200 mg total) daily for 7 days, THEN 6 tablets (240 mg total) daily for 16 days, starting on 09/22/19. Stop mid June.    11/24/2019 Surgery   left prophylactic mastectomy and bilateral reconstruction at [facility] by Dr Cay Shaggy 11/24/19.   03/21/2020 Surgery   XI ROBOTIC ASSISTED TOTAL HYSTERECTOMY WITH BILATERAL SALPINGO OOPHORECTOMY/CYSTOSCOPY, Pelvic Washing by Dr Myrtis  A. PERITONEAL, WASHING:    FINAL MICROSCOPIC DIAGNOSIS:  - Reactive mesothelial cells present    FINAL MICROSCOPIC DIAGNOSIS:   A. UTERUS, CERVIX, BILATERAL TUBE AND OVARIES:  - Uterus:       Endometrium: Inactive endometrium. No hyperplasia or malignancy.       Myometrium: Unremarkable. No malignancy.       Serosa: Unremarkable. No malignancy.  - Cervix: Benign squamous and endocervical mucosa. No dysplasia or  malignancy.  - Bilateral ovaries: Unremarkable. No malignancy.  - Bilateral fallopian tubes:  Unremarkable. No malignancy.     05/01/2021 Imaging   EXAM: CT CHEST WITHOUT CONTRAST  IMPRESSION: 1. Stable 9 x 7 mm peripheral right lower lobe pulmonary nodule. This is unchanged comparing back to PET-CT 06/05/2018 when showed no substantial hypermetabolism. Given stability over greater than 2 years, this is compatible with benign etiology. 2. Stable appearance of hepatic cysts.      Discussed the use of AI scribe software for clinical note transcription with the patient, who gave verbal consent to proceed.  History of Present Illness   The patient is a 48 year old woman with a history of breast cancer, currently in remission. She is being followed up for metastatic cancer. She reports no new symptoms or health issues since her last visit. The patient is also discussing her husband's recent health issues, including his treatment for urothelial carcinoma. She expresses concern about the possibility of cancer recurrence, given her husband's recent health scare. The patient is also discussing her ongoing cancer surveillance, including potential tests to detect circulating tumor DNA. She is open to this testing as a means of early detection for potential recurrence. The patient also mentions some concerns about her cholesterol and blood sugar levels, which have increased since her menopause and initiation of cancer treatment. She is currently managing these issues with lifestyle modifications.         All other systems were reviewed with the patient and are negative.  MEDICAL HISTORY:  Past Medical History:  Diagnosis Date   Acid reflux    Acute bronchitis    Anemia    iron  infusions   Anxiety    Asthma    rare   Cancer (HCC)    Depression    Family history of brain cancer    Family history of breast cancer    Family history of prostate cancer    Migraine     SURGICAL HISTORY: Past Surgical History:  Procedure Laterality Date   BREAST BIOPSY Right 05/23/2018   x3,  malignant   DILATION AND CURETTAGE OF UTERUS     2006 x1, 2009 x2   LAPAROTOMY  2008   PORTACATH PLACEMENT Left 06/06/2018   Procedure: INSERTION PORT-A-CATH POSSIBLE ULTRASOUND;  Surgeon: Curvin Deward MOULD, MD;  Location: [facility];  Service: General;  Laterality: Left;   ROBOTIC ASSISTED TOTAL HYSTERECTOMY WITH BILATERAL SALPINGO OOPHERECTOMY Bilateral 03/21/2020   Procedure: XI ROBOTIC ASSISTED TOTAL HYSTERECTOMY WITH BILATERAL SALPINGO OOPHORECTOMY/CYSTOSCOPY,Pelvic Washing;  Surgeon: Kandyce Sor, MD;  Location: [facility] [place];  Service: Gynecology;  Laterality: Bilateral;  [name] to RNFA confirmed on 03/03/20 CS    I have reviewed the social history and family history with the patient and they are unchanged from previous note.  ALLERGIES:  has no known allergies.  MEDICATIONS:  Current Outpatient Medications  Medication Sig Dispense Refill   CALCIUM -MAGNESIUM-VITAMIN D  PO Take 1 tablet by mouth daily.     exemestane  (AROMASIN ) 25 MG tablet TAKE 1 TABLET BY MOUTH EVERY DAY AFTER BREAKFAST 90 tablet 1   gabapentin  (NEURONTIN ) 100 MG capsule TAKE 2 CAPSULES BY MOUTH AT BEDTIME. 180 capsule 0   melatonin 5 MG TABS Take 5 mg by mouth at bedtime.      methylphenidate 18 MG PO CR tablet Take 18 mg by mouth every morning.     senna (SENOKOT) 8.6 MG tablet Take 1 tablet by mouth daily.     traZODone (DESYREL) 50 MG tablet Take 25-50 mg by mouth at bedtime.  No current facility-administered medications for this visit.    PHYSICAL EXAMINATION: ECOG PERFORMANCE STATUS: 0 - Asymptomatic  Vitals:   05/21/23 0850  BP: 113/75  Pulse: 69  Resp: 17  Temp: 97.9 F (36.6 C)  SpO2: 100%   Wt Readings from Last 3 Encounters:  05/21/23 146 lb 2 oz (66.3 kg)  12/04/22 150 lb 4.8 oz (68.2 kg)  06/04/22 155 lb 3.2 oz (70.4 kg)     GENERAL:alert, no distress and comfortable SKIN: skin color, texture, turgor are normal, no rashes or significant lesions EYES:  normal, Conjunctiva are pink and non-injected, sclera clear NECK: supple, thyroid  normal size, non-tender, without nodularity LYMPH:  no palpable lymphadenopathy in the cervical, axillary  LUNGS: clear to auscultation and percussion with normal breathing effort HEART: regular rate & rhythm and no murmurs and no lower extremity edema ABDOMEN:abdomen soft, non-tender and normal bowel sounds Musculoskeletal:no cyanosis of digits and no clubbing  NEURO: alert & oriented x 3 with fluent speech, no focal motor/sensory deficits BREAST: Status post bilateral mastectomy and reconstruction, left side incision healed well, right side exhibits more scar tissue.  No palpable nodule or adenopathy.      LABORATORY DATA:  I have reviewed the data as listed    Latest Ref Rng & Units 05/21/2023    8:29 AM 12/04/2022    7:46 AM 06/04/2022    8:09 AM  CBC  WBC 4.0 - 10.5 K/uL 3.4  2.6  3.1   Hemoglobin 12.0 - 15.0 g/dL 87.0  86.7  86.4   Hematocrit 36.0 - 46.0 % 39.5  40.3  40.8   Platelets 150 - 400 K/uL 214  178  171         Latest Ref Rng & Units 05/21/2023    8:29 AM 12/04/2022    7:46 AM 06/04/2022    8:09 AM  CMP  Glucose 70 - 99 mg/dL 64  89  64   BUN 6 - 20 mg/dL 22  16  15    Creatinine 0.44 - 1.00 mg/dL 9.13  9.08  9.21   Sodium 135 - 145 mmol/L 140  142  140   Potassium 3.5 - 5.1 mmol/L 3.9  3.9  4.0   Chloride 98 - 111 mmol/L 102  105  104   CO2 22 - 32 mmol/L 31  30  31    Calcium  8.9 - 10.3 mg/dL 9.9  9.9  9.7   Total Protein 6.5 - 8.1 g/dL 7.3  7.9  7.5   Total Bilirubin 0.0 - 1.2 mg/dL 0.5  0.5  0.5   Alkaline Phos 38 - 126 U/L 45  49  49   AST 15 - 41 U/L 12  11  13    ALT 0 - 44 U/L 13  16  22        RADIOGRAPHIC STUDIES: I have personally reviewed the radiological images as listed and agreed with the findings in the report. No results found.    No orders of the defined types were placed in this encounter.  All questions were answered. The patient knows to call the clinic  with any problems, questions or concerns. No barriers to learning was detected. The total time spent in the appointment was 30 minutes.     Onita Mattock, MD 05/21/2023

## 2023-05-22 ENCOUNTER — Other Ambulatory Visit: Payer: Self-pay

## 2023-05-22 DIAGNOSIS — Z170 Estrogen receptor positive status [ER+]: Secondary | ICD-10-CM

## 2023-05-22 LAB — CANCER ANTIGEN 27.29: CA 27.29: 20.4 U/mL (ref 0.0–38.6)

## 2023-05-22 NOTE — Progress Notes (Signed)
Per Dr. Latanya Maudlin request. The CMA successfully faxed Singatera request to [phone]. Received Fax confirmation. To be drawn at patient's home in 1-2 weeks.

## 2023-05-27 ENCOUNTER — Encounter: Payer: Self-pay | Admitting: Nurse Practitioner

## 2023-06-19 ENCOUNTER — Encounter: Payer: Self-pay | Admitting: Genetic Counselor

## 2023-06-20 ENCOUNTER — Encounter: Payer: Self-pay | Admitting: Hematology

## 2023-07-08 ENCOUNTER — Other Ambulatory Visit (HOSPITAL_COMMUNITY): Payer: Self-pay | Admitting: Radiology

## 2023-07-08 ENCOUNTER — Encounter: Payer: Self-pay | Admitting: Hematology

## 2023-07-08 DIAGNOSIS — J45990 Exercise induced bronchospasm: Secondary | ICD-10-CM

## 2023-07-23 ENCOUNTER — Encounter: Payer: Self-pay | Admitting: Hematology

## 2023-07-26 ENCOUNTER — Inpatient Hospital Stay (HOSPITAL_COMMUNITY): Admission: RE | Admit: 2023-07-26 | Source: Ambulatory Visit

## 2023-10-12 ENCOUNTER — Encounter: Payer: Self-pay | Admitting: Hematology

## 2023-10-14 ENCOUNTER — Other Ambulatory Visit: Payer: Self-pay | Admitting: Nurse Practitioner

## 2023-10-14 MED ORDER — EXEMESTANE 25 MG PO TABS: 25.0000 mg | ORAL_TABLET | Freq: Every day | ORAL | 1 refills | Status: DC

## 2024-02-17 ENCOUNTER — Other Ambulatory Visit: Payer: Self-pay

## 2024-02-17 DIAGNOSIS — C50811 Malignant neoplasm of overlapping sites of right female breast: Secondary | ICD-10-CM

## 2024-02-18 ENCOUNTER — Inpatient Hospital Stay

## 2024-02-18 ENCOUNTER — Inpatient Hospital Stay: Payer: 59 | Attending: Hematology

## 2024-02-18 ENCOUNTER — Inpatient Hospital Stay: Payer: 59 | Admitting: Hematology

## 2024-02-18 VITALS — BP 106/64 | HR 66 | Temp 98.2°F | Resp 15 | Ht 69.0 in | Wt 146.6 lb

## 2024-02-18 DIAGNOSIS — E2839 Other primary ovarian failure: Secondary | ICD-10-CM | POA: Diagnosis not present

## 2024-02-18 DIAGNOSIS — Z1509 Genetic susceptibility to other malignant neoplasm: Secondary | ICD-10-CM

## 2024-02-18 DIAGNOSIS — R5383 Other fatigue: Secondary | ICD-10-CM | POA: Diagnosis not present

## 2024-02-18 DIAGNOSIS — Z170 Estrogen receptor positive status [ER+]: Secondary | ICD-10-CM | POA: Insufficient documentation

## 2024-02-18 DIAGNOSIS — C50811 Malignant neoplasm of overlapping sites of right female breast: Secondary | ICD-10-CM

## 2024-02-18 DIAGNOSIS — N319 Neuromuscular dysfunction of bladder, unspecified: Secondary | ICD-10-CM | POA: Insufficient documentation

## 2024-02-18 DIAGNOSIS — Z1501 Genetic susceptibility to malignant neoplasm of breast: Secondary | ICD-10-CM

## 2024-02-18 DIAGNOSIS — Z1589 Genetic susceptibility to other disease: Secondary | ICD-10-CM

## 2024-02-18 LAB — CBC WITH DIFFERENTIAL (CANCER CENTER ONLY)
Abs Immature Granulocytes: 0.01 K/uL (ref 0.00–0.07)
Basophils Absolute: 0 K/uL (ref 0.0–0.1)
Basophils Relative: 1 %
Eosinophils Absolute: 0.1 K/uL (ref 0.0–0.5)
Eosinophils Relative: 2 %
HCT: 39 % (ref 36.0–46.0)
Hemoglobin: 13.2 g/dL (ref 12.0–15.0)
Immature Granulocytes: 0 %
Lymphocytes Relative: 22 %
Lymphs Abs: 0.7 K/uL (ref 0.7–4.0)
MCH: 31.1 pg (ref 26.0–34.0)
MCHC: 33.8 g/dL (ref 30.0–36.0)
MCV: 92 fL (ref 80.0–100.0)
Monocytes Absolute: 0.3 K/uL (ref 0.1–1.0)
Monocytes Relative: 8 %
Neutro Abs: 2.2 K/uL (ref 1.7–7.7)
Neutrophils Relative %: 67 %
Platelet Count: 174 K/uL (ref 150–400)
RBC: 4.24 MIL/uL (ref 3.87–5.11)
RDW: 12.2 % (ref 11.5–15.5)
WBC Count: 3.3 K/uL — ABNORMAL LOW (ref 4.0–10.5)
nRBC: 0 % (ref 0.0–0.2)

## 2024-02-18 LAB — CMP (CANCER CENTER ONLY)
ALT: 11 U/L (ref 0–44)
AST: <10 U/L — ABNORMAL LOW (ref 15–41)
Albumin: 4.7 g/dL (ref 3.5–5.0)
Alkaline Phosphatase: 48 U/L (ref 38–126)
Anion gap: 4 — ABNORMAL LOW (ref 5–15)
BUN: 21 mg/dL — ABNORMAL HIGH (ref 6–20)
CO2: 32 mmol/L (ref 22–32)
Calcium: 10 mg/dL (ref 8.9–10.3)
Chloride: 103 mmol/L (ref 98–111)
Creatinine: 0.9 mg/dL (ref 0.44–1.00)
GFR, Estimated: >60 mL/min (ref >60)
Glucose, Bld: 73 mg/dL (ref 70–99)
Potassium: 4.3 mmol/L (ref 3.5–5.1)
Sodium: 139 mmol/L (ref 135–145)
Total Bilirubin: 0.5 mg/dL (ref 0.0–1.2)
Total Protein: 7.4 g/dL (ref 6.5–8.1)

## 2024-02-18 LAB — GENETIC SCREENING ORDER

## 2024-02-18 LAB — VITAMIN B12: Vitamin B-12: 779 pg/mL (ref 180–914)

## 2024-02-18 LAB — VITAMIN D 25 HYDROXY (VIT D DEFICIENCY, FRACTURES): Vit D, 25-Hydroxy: 59.15 ng/mL (ref 30–100)

## 2024-02-18 LAB — TSH: TSH: 2.61 u[IU]/mL (ref 0.350–4.500)

## 2024-02-18 MED ORDER — EXEMESTANE 25 MG PO TABS: 25.0000 mg | ORAL_TABLET | Freq: Every day | ORAL | 3 refills | Status: AC

## 2024-02-18 NOTE — Assessment & Plan Note (Signed)
cT2N1M0, ER+/PR+, HER2- in breast, HER2(+) in node, Grade II-III, ypT1cN2a -diagnosed in 05/2018, treated with Neoadjuvant TCHP, right mastectomy, adjuvant radiation through [facility], and maintenance Herceptin/Perjeta then 14 cycles of Kadcyla.   -s/p prophylactic left mastectomy and b/l reconstruction 11/24/19 by Dr. Valentino Saxon [facility]). -she completed 1 year Nerlynx in 09/2020 -she started Letrozole in 02/2019 (monthly Zoladex 12/2018-02/2020 until BSO 03/21/20). She developed worsening joint stiffness and switched to exemestane 01/16/21. Tolerating well overall with improved joint pain. Plan for a total of 10 years (until 02/2029) if she tolerates well  -she is clinically doing very well. There is no clinical concern for recurrence. -continue exemestane and surveillance; she is s/p b/l mastectomies. -f/u in 6 months with next Zometa

## 2024-02-18 NOTE — Progress Notes (Signed)
 [facility]   [phone] [phone]   Clinic Follow up Note   Patient Care Team: Douglass Ivanoff, MD as PCP - General (Family Medicine) Curvin Deward MOULD, MD as Consulting Physician (General Surgery) Lanny Callander, MD as Consulting Physician (Hematology) Shannon Agent, MD as Consulting Physician (Radiation Oncology) Kandyce Sor, MD as Consulting Physician (Obstetrics and Gynecology) Tyree Nanetta SAILOR, RN as Oncology Nurse Navigator Burton, Lacie K, NP as Nurse Practitioner (Nurse Practitioner)  Date of Service:  02/18/2024  CHIEF COMPLAINT: f/u of breast cancer  CURRENT THERAPY:  Adjuvant exemestane   Oncology History   Monoallelic mutation of PALB2 gene -Genetic testing from [facility] done 05/2018 was positive for PALB2 mutation. She has discovered a stronger maternal family history of prostate and breast cancer in her 1st, 2nd and 3rd degree relatives, Including her mother who had breast cancer also.  -We discussed the risk of future breast cancer from PALB2 mutation. Given her family history, she is s/p b/l mastectomy and reconstruction (10/21/18 and 11/24/19) and s/p BSO with hysterectomy (03/21/20).  -Her baseline 06/2020 colonoscopy/endoscopy was benign per patient.  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   -s/p prophylactic left mastectomy and b/l reconstruction 11/24/19 by Dr. L[facility]). -she completed 1 year Nerlynx  in 09/2020 -she started Letrozole  in 02/2019 (monthly Zoladex  12/2018-02/2020 until BSO 03/21/20). She developed worsening joint stiffness and switched to exemestane  01/16/21. Tolerating well overall with improved joint pain. Plan for a total of 10 years (until 02/2029) if she tolerates well   -she is clinically doing very well. There is no clinical concern for recurrence. -continue exemestane  and surveillance; she is s/p b/l mastectomies. -f/u in 6 months with next Zometa   Assessment & Plan Malignant neoplasm of overlapping sites of right breast Breast cancer is being monitored with Signatera testing every six months due to low recurrence risk and longer monitoring course. Completed four doses of Zometa  infusion, with the last dose in August 2025. No new lumps or nodules detected on self-examination. - Continue Signatera testing every six months. - Ordered bone density scan at [facility] facility. - Scheduled follow-up in six months.  Fatigue Persistent fatigue for the past four to six weeks, slightly improved. No fever, weight loss, or significant sleep disturbances. Possible thyroid  dysfunction or nutritional deficiency considered. - Ordered thyroid  function tests. - Ordered vitamin D and B12 levels.  Bladder dysfunction, unspecified Intermittent bladder dysfunction with frequent urination and sensation of incomplete voiding. No fever or burning sensation. UTI ruled out at urgent care. Symptoms have subsided but persist intermittently. -she will f/u with her gynecologist for further evaluation of bladder dysfunction.  Plan - Lab reviewed, exam was unremarkable. - Will obtain additional lab due to fatigue, including TSH, vitamin D level, B12 level, and Signatera - Lab and follow-up in 6 months.   SUMMARY OF ONCOLOGIC HISTORY: Oncology History Overview Note  Cancer Staging Cancer of overlapping sites of right female breast [facility]) Staging form: Breast, AJCC 8th Edition - Clinical stage from 05/28/2018: Stage IB (cT2, cN1, cM0, G3, ER+, PR+, HER2+) - Signed by Lanny Callander, MD on 05/28/2018  Malignant neoplasm of lower-outer quadrant of right breast of female, estrogen receptor positive (HCC) Staging form: Breast, AJCC 8th Edition - Clinical stage from 05/28/2018: Stage IIB  (cT2, cN1, cM0, G3, ER+, PR+,  HER2-) - Unsigned     Cancer of overlapping sites of right female breast (HCC)  05/14/2018 Mammogram   Right breast mammogram 05/14/18  IMPRESSION  There is a 1.0 x 0.5 x 1.2 cm lobular hypoechoic mass right breast 7 o'clock position 3 cm from nipple, a 0.8 x 0.6 x 1.2 cm lobular hypoechoic mass right breast 7 o'clock position 2 cm from the nipple, and a 0.9 x 0.5 x 0.8 cm lobular hypoechoic mass right breast 5 o'clock position 3 cm from nipple.   There is a 3.8 x 1.0 x 1.3 cm lobular hypoechoic mass right breast 5 o'clock position 2 cm from nipple.   Multiple thickened right axillary lymph nodes (approximately 15). Measuring up to 1.0 cm in thickness. Adenopathy corresponds with axillary palpable abnormality.  Indeterminate round and punctate calcifications upper-outer quadrant right breast.Indeterminate round and punctate calcifications upper-outer quadrant right breast.   05/21/2018 Initial Biopsy   Diagnosis 05/21/18  1. Breast, right, needle core biopsy, axilla, 5 o'clock, ribbon clip - INVASIVE MAMMARY CARCINOMA. - MAMMARY CARCINOMA IN SITU. 2. Breast, right, needle core biopsy, 7 o'clock, coil clip - INVASIVE MAMMARY CARCINOMA. - MAMMARY CARCINOMA IN SITU. 3. Lymph node, needle/core biopsy, right axillary LN, hydromark - METASTATIC MAMMARY CARCINOMA.    05/21/2018 Receptors her2   1. PROGNOSTIC INDICATORS Results: The tumor cells are EQUIVOCAL for Her2 (2+);  HER2 **NEGATIVE** by FISH   Estrogen Receptor: 80%, POSITIVE, MODERATE STAINING INTENSITY Progesterone Receptor: 5%, POSITIVE, MODERATE-WEAK STAINING INTENSITY Proliferation Marker Ki67: 10%  3. PROGNOSTIC INDICATORS Results: The tumor cells are POSITIVE for Her2 (3+). Estrogen Receptor: 90%, POSITIVE, STRONG STAINING INTENSITY Progesterone Receptor: 50%, POSITIVE, MODERATE STAINING INTENSITY Proliferation Marker Ki67: 5%   05/26/2018 Initial Diagnosis   Malignant neoplasm of  lower-inner quadrant of right breast of female, estrogen receptor positive (HCC)   05/27/2018 Mammogram   Left breast mammogram 05/27/18  IMPRESSION: No mammographic evidence of LEFT breast malignancy.   05/28/2018 Cancer Staging   Staging form: Breast, AJCC 8th Edition - Clinical stage from 05/28/2018: Stage IB (cT2, cN1, cM0, G3, ER+, PR+, HER2+) - Signed by Lanny Callander, MD on 05/28/2018   06/03/2018 Imaging   MR BREAST BILATERAL W WO CONTRAST INC CAD  IMPRESSION: 1. Large enhancing masses and non mass enhancement identified throughout the majority of the right breast. Additionally, there are smaller masses identified within the upper inner and lower inner right breast. Overall findings are concerning for extensive malignancy throughout all 4 quadrants of the right breast. 2. Extensive bulky lymphadenopathy identified within the right axilla. The matted appearance makes counting the number of abnormal lymph nodes difficult as they are immediately approximated to each other. 3. At least one abnormal appearing right internal mammary lymph node. 4. Nonenhancing lesion within the sternum with associated sternal marrow heterogeneity, raising the possibility of osseous metastatic disease within the sternum. 5. Two enhancing masses within the left breast as described above.     06/05/2018 PET scan   NM PET Image Initial (PI) Skull Base To Thigh  IMPRESSION: 1. Multiple borderline enlarged right axillary lymph nodes identified exhibiting mild to moderate increased uptake. Can not rule out nodal metastasis. Correlation with biopsy recommended. 2. Asymmetric increased uptake within the right breast corresponding to MRI abnormality. 3. Subcentimeter right retropectoral and right internal mammary lymph nodes are again identified and exhibit nonspecific, low level FDG uptake. 4. Noncalcified solid nodule within the lateral right lower lobe measures 9 mm without significant radiotracer  uptake. 5. No hypermetabolic osseous  lesions identified.     06/09/2018 - 11/03/2018 Chemotherapy   Neoadjuvant chemotherapy TCHP, 06/09/2018-09/22/18. Followed by Maintenance Herceptin /Perjeta  q3weeks starting 10/14/18 to continue 1 year treatment. Stopped Herceptin /Perjeta  on 11/03/18 due to multiple Positive LN of pathology.    06/10/2018 Pathology Results   Surgical pathology  Diagnosis 1. Breast, left, needle core biopsy, LOQ, barbell - MILD FIBROCYSTIC CHANGES. 2. Breast, left, needle core biopsy, UIQ cylinder - MILD FIBROCYSTIC CHANGES.    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 There were no deleterious mutations in AIP, ALK, APC, ATM, AXIN2,BAP1,  BARD1, BLM, BMPR1A, BRCA1, BRCA2, BRIP1, CASR, CDC73, CDH1, CDK4, CDKN1B, CDKN1C, CDKN2A (p14ARF), CDKN2A (p16INK4a), CEBPA, CHEK2, CTNNA1, DICER1, DIS3L2, EGFR (c.2369C>T, p.Thr790Met variant only), EPCAM (Deletion/duplication testing only), FH, FLCN, GATA2, GPC3, GREM1 (Promoter region deletion/duplication testing only), HOXB13 (c.251G>A, p.Gly84Glu), HRAS, KIT, MAX, MEN1, MET, MITF (c.952G>A, p.Glu318Lys variant only), MLH1, MSH2, MSH3, MSH6, MUTYH, NBN, NF1, NF2, NTHL1, PDGFRA, PHOX2B, PMS2, POLD1, POLE, POT1, PRKAR1A, PTCH1, PTEN, RAD50, RAD51C, RAD51D, RB1, RECQL4, RET, RUNX1, SDHAF2, SDHA (sequence changes only), SDHB, SDHC, SDHD, SMAD4, SMARCA4, SMARCB1, SMARCE1, STK11, SUFU, TERC, TERT, TMEM127, TP53, TSC1, TSC2, VHL, WRN and WT1.  .   09/24/2018 Breast MRI   Breast [facility] from [facility] on 09/24/18 Right breast: Signal void inferior right breast, posterior depth from biopsy marker clip. Interval decreased size of the right breast, and now symmetric to the left, compared to prior MRI. Resolution of abnormal nonmass enhancement without evidence of residual enhancing mass. No evidence  of axillary or internal mammary lymphadenopathy is seen, markedly improved from prior. There is no abnormal skin, nipple, or pectoralis muscle enhancement.  Left breast: There are no suspicious enhancing masses or areas of non-mass enhancement. No evidence of axillary or internal mammary lymphadenopathy is seen. There is no abnormal skin, nipple, or pectoralis muscle enhancement.   10/09/2018 Imaging   CT chest W Contrast 10/09/18  IMPRESSION: 1. Stable 10 mm right lower lobe pulmonary nodule since 06/05/2018. Nodule is not substantially hypermetabolic on the PET-CT. Continued attention on follow-up recommended. 2. No findings to suggest metastatic disease in the thorax.   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 surrounding tissue - Biopsy clips identified - Margin status (see extended anterior margin below)  Invasive carcinoma: Positive anterior-inferior margin  Ductal carcinoma in situ: Positive anterior-inferior margin - Ancillary studies previously reported on [id number](5:00, ribbon clip) Estrogen receptor: Positive (80%) Progesterone receptor: Positive (10%) HER2 IHC: Equivocal (2+) HER2 FISH: Negative HER2/CEP17 ratio: 1.35 Mean HER2 copy number: 1.55 - Ancillary studies previously reported on [id number] (right axillary lymph node) Estrogen receptor: Positive (90%) Progesterone receptor: Positive (20%) HER2 IHC: Positive (3+) - Residual Cancer Burden  Tumor bed size: 41 mm x 20 mm Overall tumor cellularity: 10% Percentage in situ: 60% Number of involved lymph nodes: 7 Diameter of largest metastasis: 6 mm Residual Cancer Burden Score: 3.108 Residual  Cancer Burden Class: RCB-II - Other findings: Atypical lobular hyperplasia and columnar cell change with microcalcifications; fibroadenomas with microcalcifications  B: Right axilla, targeted lymph node  excision - Six of seven lymph nodes positive for carcinoma (6/7) - Size of largest tumor deposit: 6 mm - Biopsy site identified - Treatment effect: Present - Extracapsular extension: Absent  C: Right axilla, lymph node dissection - One of seven lymph nodes positive for carcinoma, macrometastasis, 6 mm (1/7) - Treatment effect: Absent - Extracapsular extension: Absent  D: Right breast, nipple core biopsy - Negative for carcinoma  E: Breast, right, anterior margin, excision - Microscopic foci of ductal carcinoma in situ - Margin: Negative, <1 mm    11/05/2018 Cancer Staging   Staging form: Breast, AJCC 8th Edition - Pathologic stage from 11/05/2018: No Stage Recommended (ypT1c, pN2a, cM0, G2, ER+, PR+, HER2-) - Signed by Lanny Callander, MD on 11/23/2018   11/24/2018 - 08/24/2019 Chemotherapy   Due to positive LN on surgical pathology her maintance Herceptin /Perjeta  was changed to Kadcyla  q3weeks for about 14 cycles on 11/24/18. Completed on 08/24/19   12/24/2018 - 02/03/2019 Radiation Therapy   adjuvant radiation at [facility] on 12/24/18 and completed on 02/03/19.    01/23/2019 Imaging   CT Chest wo  IMPRESSION: 1. Stable right lower lobe pulmonary nodule. 2. No signs of new or suspicious finding since study of 10/09/2018, now status post right mastectomy, axillary dissection and breast reconstruction.   02/16/2019 -  Anti-estrogen oral therapy   -Monthly Zoladex  injection started 12/19/18-02/22/20. Stopped after BSO surgery in 03/2020 -Letrozole  2.5 mg started 02/16/19    04/20/2019 Survivorship   SCP delivered by Lacie Burton, NP    09/22/2019 - 09/2020 Chemotherapy   Nerlynx  starting at 4 tablets (160 mg total) by mouth daily for 7 days, THEN 5 tablets (200 mg total) daily for 7  days, THEN 6 tablets (240 mg total) daily for 16 days, starting on 09/22/19. Stop mid June.    11/24/2019 Surgery   left prophylactic mastectomy and bilateral reconstruction at [facility] by Dr Cay Shaggy 11/24/19.   03/21/2020 Surgery   XI ROBOTIC ASSISTED TOTAL HYSTERECTOMY WITH BILATERAL SALPINGO OOPHORECTOMY/CYSTOSCOPY, Pelvic Washing by Dr Myrtis  A. PERITONEAL, WASHING:    FINAL MICROSCOPIC DIAGNOSIS:  - Reactive mesothelial cells present    FINAL MICROSCOPIC DIAGNOSIS:   A. UTERUS, CERVIX, BILATERAL TUBE AND OVARIES:  - Uterus:       Endometrium: Inactive endometrium. No hyperplasia or malignancy.       Myometrium: Unremarkable. No malignancy.       Serosa: Unremarkable. No malignancy.  - Cervix: Benign squamous and endocervical mucosa. No dysplasia or  malignancy.  - Bilateral ovaries: Unremarkable. No malignancy.  - Bilateral fallopian tubes: Unremarkable. No malignancy.     05/01/2021 Imaging   EXAM: CT CHEST WITHOUT CONTRAST  IMPRESSION: 1. Stable 9 x 7 mm peripheral right lower lobe pulmonary nodule. This is unchanged comparing back to PET-CT 06/05/2018 when showed no substantial hypermetabolism. Given stability over greater than 2 years, this is compatible with benign etiology. 2. Stable appearance of hepatic cysts.      Discussed the use of AI scribe software for clinical note transcription with the patient, who gave verbal consent to proceed.  History of Present Illness Kim [name] is a 48 year old female with breast cancer who presents for a follow-up visit.  She continues her breast cancer management and monitoring, with her last blood draw and Zometa  infusion in August 2025. A bone density scan was performed two years ago.  Four to six weeks ago, she experienced frequent urination sensations without actual urination, without pain or burning. A  UTI was ruled out, and symptoms have somewhat subsided but recur occasionally.  She reports significant  fatigue, feeling exhausted but maintaining daily activities. She sleeps well, getting seven to eight hours per night, though she wakes to urinate. No weight loss or joint pain is present.  She experiences cold sensitivity, which she associates with reduced hot flashes. Her thyroid  function was normal when last checked in 2023.     All other systems were reviewed with the patient and are negative.  MEDICAL HISTORY:  Past Medical History:  Diagnosis Date   Acid reflux    Acute bronchitis    Anemia    iron  infusions   Anxiety    Asthma    rare   Cancer (HCC)    Depression    Family history of brain cancer    Family history of breast cancer    Family history of prostate cancer    Migraine     SURGICAL HISTORY: Past Surgical History:  Procedure Laterality Date   BREAST BIOPSY Right 05/23/2018   x3, malignant   DILATION AND CURETTAGE OF UTERUS     2006 x1, 2009 x2   LAPAROTOMY  2008   PORTACATH PLACEMENT Left 06/06/2018   Procedure: INSERTION PORT-A-CATH POSSIBLE ULTRASOUND;  Surgeon: Curvin Deward MOULD, MD;  Location: [facility];  Service: General;  Laterality: Left;   ROBOTIC ASSISTED TOTAL HYSTERECTOMY WITH BILATERAL SALPINGO OOPHERECTOMY Bilateral 03/21/2020   Procedure: XI ROBOTIC ASSISTED TOTAL HYSTERECTOMY WITH BILATERAL SALPINGO OOPHORECTOMY/CYSTOSCOPY,Pelvic Washing;  Surgeon: Kandyce Sor, MD;  Location: [facility];  Service: Gynecology;  Laterality: Bilateral;  [name] to RNFA confirmed on 03/03/20 CS    I have reviewed the social history and family history with the patient and they are unchanged from previous note.  ALLERGIES:  has no known allergies.  MEDICATIONS:  Current Outpatient Medications  Medication Sig Dispense Refill   CALCIUM -MAGNESIUM-VITAMIN D PO Take 1 tablet by mouth daily.     gabapentin  (NEURONTIN ) 100 MG capsule TAKE 2 CAPSULES BY MOUTH AT BEDTIME. 180 capsule 0   ketoconazole (NIZORAL) 2 % cream 1 Application.      melatonin 5 MG TABS Take 5 mg by mouth at bedtime.      methylphenidate 18 MG PO CR tablet Take 18 mg by mouth every morning.     senna (SENOKOT) 8.6 MG tablet Take 1 tablet by mouth daily.     terbinafine (LAMISIL) 250 MG tablet 1 tablet Orally Once a day; Duration: 30 days     traZODone (DESYREL) 50 MG tablet Take 25-50 mg by mouth at bedtime.     exemestane  (AROMASIN ) 25 MG tablet Take 1 tablet (25 mg total) by mouth daily. 90 tablet 3   No current facility-administered medications for this visit.    PHYSICAL EXAMINATION: ECOG PERFORMANCE STATUS: 0 - Asymptomatic  Vitals:   02/18/24 1056  BP: 106/64  Pulse: 66  Resp: 15  Temp: 98.2 F (36.8 C)  SpO2: 99%   Wt Readings from Last 3 Encounters:  02/18/24 146 lb 9.6 oz (66.5 kg)  05/21/23 146 lb 2 oz (66.3 kg)  12/04/22 150 lb 4.8 oz (68.2 kg)     GENERAL:alert, no distress and comfortable SKIN: skin color, texture, turgor are normal, no rashes or significant lesions EYES: normal, Conjunctiva are pink and non-injected, sclera clear NECK: supple, thyroid  normal size, non-tender, without nodularity LYMPH:  no palpable lymphadenopathy in the cervical, axillary  LUNGS: clear to auscultation and percussion with normal breathing  effort HEART: regular rate & rhythm and no murmurs and no lower extremity edema ABDOMEN:abdomen soft, non-tender and normal bowel sounds Musculoskeletal:no cyanosis of digits and no clubbing  NEURO: alert & oriented x 3 with fluent speech, no focal motor/sensory deficits BREAST: Status post bilateral mastectomy and implant reconstruction, breasts with scar tissue, no tenderness or palpable mass or adenopathy. Physical Exam   LABORATORY DATA:  I have reviewed the data as listed    Latest Ref Rng & Units 02/18/2024   10:36 AM 05/21/2023    8:29 AM 12/04/2022    7:46 AM  CBC  WBC 4.0 - 10.5 K/uL 3.3  3.4  2.6   Hemoglobin 12.0 - 15.0 g/dL 86.7  87.0  86.7   Hematocrit 36.0 - 46.0 % 39.0  39.5  40.3    Platelets 150 - 400 K/uL 174  214  178         Latest Ref Rng & Units 02/18/2024   10:36 AM 05/21/2023    8:29 AM 12/04/2022    7:46 AM  CMP  Glucose 70 - 99 mg/dL 73  64  89   BUN 6 - 20 mg/dL 21  22  16    Creatinine 0.44 - 1.00 mg/dL 9.09  9.13  9.08   Sodium 135 - 145 mmol/L 139  140  142   Potassium 3.5 - 5.1 mmol/L 4.3  3.9  3.9   Chloride 98 - 111 mmol/L 103  102  105   CO2 22 - 32 mmol/L 32  31  30   Calcium  8.9 - 10.3 mg/dL 89.9  9.9  9.9   Total Protein 6.5 - 8.1 g/dL 7.4  7.3  7.9   Total Bilirubin 0.0 - 1.2 mg/dL 0.5  0.5  0.5   Alkaline Phos 38 - 126 U/L 48  45  49   AST 15 - 41 U/L 10  12  11    ALT 0 - 44 U/L 11  13  16        RADIOGRAPHIC STUDIES: I have personally reviewed the radiological images as listed and agreed with the findings in the report. No results found.    Orders Placed This Encounter  Procedures   DG Bone Density    Standing Status:   Future    Expected Date:   03/19/2024    Expiration Date:   02/17/2025    Reason for Exam (SYMPTOM  OR DIAGNOSIS REQUIRED):   screening    Is patient pregnant?:   No    Preferred imaging location?:   [facility]   TSH    Standing Status:   Future    Expiration Date:   02/17/2025   Vitamin D 25 hydroxy    Standing Status:   Future    Expiration Date:   02/17/2025   Vitamin B12    Standing Status:   Future    Expiration Date:   02/17/2025   All questions were answered. The patient knows to call the clinic with any problems, questions or concerns. No barriers to learning was detected. The total time spent in the appointment was 30 minutes, including review of chart and various tests results, discussions about plan of care and coordination of care plan     Onita Mattock, MD 02/18/2024

## 2024-02-18 NOTE — Assessment & Plan Note (Signed)
-  Genetic testing from [facility] done 05/2018 was positive for PALB2 mutation. She has discovered a stronger maternal family history of prostate and breast cancer in her 22st, 2nd and 3rd degree relatives, Including her mother who had breast cancer also.  -We discussed the risk of future breast cancer from PALB2 mutation. Given her family history, she is s/p b/l mastectomy and reconstruction (10/21/18 and 11/24/19) and s/p BSO with hysterectomy (03/21/20).  -Her baseline 06/2020 colonoscopy/endoscopy was benign per patient.

## 2024-02-19 LAB — CANCER ANTIGEN 27.29: CA 27.29: 23.3 U/mL (ref 0.0–38.6)

## 2024-02-23 ENCOUNTER — Encounter: Payer: Self-pay | Admitting: Hematology

## 2024-02-28 LAB — SIGNATERA ONLY (NATERA MANAGED)
SIGNATERA MTM READOUT: 0 MTM/ml
SIGNATERA TEST RESULT: NEGATIVE

## 2024-08-17 ENCOUNTER — Inpatient Hospital Stay

## 2024-08-17 ENCOUNTER — Inpatient Hospital Stay: Admitting: Hematology
